# Patient Record
Sex: Male | Born: 1935 | Race: White | Hispanic: No | Smoking: Former smoker
Health system: Southern US, Community
[De-identification: ages and names within clinical notes are randomized; demographics above are authoritative.]

## PROBLEM LIST (undated history)

## (undated) DIAGNOSIS — S43109A Unspecified dislocation of unspecified acromioclavicular joint, initial encounter: Secondary | ICD-10-CM

## (undated) DIAGNOSIS — I480 Paroxysmal atrial fibrillation: Secondary | ICD-10-CM

## (undated) DIAGNOSIS — S2239XA Fracture of one rib, unspecified side, initial encounter for closed fracture: Secondary | ICD-10-CM

## (undated) DIAGNOSIS — M1990 Unspecified osteoarthritis, unspecified site: Secondary | ICD-10-CM

## (undated) DIAGNOSIS — I10 Essential (primary) hypertension: Secondary | ICD-10-CM

## (undated) DIAGNOSIS — R42 Dizziness and giddiness: Secondary | ICD-10-CM

## (undated) DIAGNOSIS — N400 Enlarged prostate without lower urinary tract symptoms: Secondary | ICD-10-CM

## (undated) DIAGNOSIS — Z8679 Personal history of other diseases of the circulatory system: Secondary | ICD-10-CM

## (undated) DIAGNOSIS — E11621 Type 2 diabetes mellitus with foot ulcer: Secondary | ICD-10-CM

## (undated) DIAGNOSIS — E7800 Pure hypercholesterolemia, unspecified: Secondary | ICD-10-CM

## (undated) DIAGNOSIS — I2510 Atherosclerotic heart disease of native coronary artery without angina pectoris: Secondary | ICD-10-CM

## (undated) DIAGNOSIS — Z8719 Personal history of other diseases of the digestive system: Secondary | ICD-10-CM

## (undated) DIAGNOSIS — K219 Gastro-esophageal reflux disease without esophagitis: Secondary | ICD-10-CM

## (undated) DIAGNOSIS — I509 Heart failure, unspecified: Secondary | ICD-10-CM

## (undated) DIAGNOSIS — E119 Type 2 diabetes mellitus without complications: Secondary | ICD-10-CM

## (undated) DIAGNOSIS — I471 Supraventricular tachycardia: Secondary | ICD-10-CM

## (undated) DIAGNOSIS — D509 Iron deficiency anemia, unspecified: Secondary | ICD-10-CM

## (undated) DIAGNOSIS — E039 Hypothyroidism, unspecified: Secondary | ICD-10-CM

## (undated) DIAGNOSIS — J309 Allergic rhinitis, unspecified: Secondary | ICD-10-CM

## (undated) DIAGNOSIS — I4719 Other supraventricular tachycardia: Secondary | ICD-10-CM

## (undated) DIAGNOSIS — Z87442 Personal history of urinary calculi: Secondary | ICD-10-CM

## (undated) DIAGNOSIS — I453 Trifascicular block: Secondary | ICD-10-CM

## (undated) HISTORY — DX: Atherosclerotic heart disease of native coronary artery without angina pectoris: I25.10

## (undated) HISTORY — DX: Iron deficiency anemia, unspecified: D50.9

## (undated) HISTORY — DX: Personal history of other diseases of the circulatory system: Z86.79

## (undated) HISTORY — DX: Benign prostatic hyperplasia without lower urinary tract symptoms: N40.0

## (undated) HISTORY — DX: Essential (primary) hypertension: I10

## (undated) HISTORY — DX: Type 2 diabetes mellitus without complications: E11.9

## (undated) HISTORY — PX: INSERT / REPLACE / REMOVE PACEMAKER: SUR710

## (undated) HISTORY — DX: Personal history of urinary calculi: Z87.442

## (undated) HISTORY — DX: Dizziness and giddiness: R42

## (undated) HISTORY — DX: Fracture of one rib, unspecified side, initial encounter for closed fracture: S22.39XA

## (undated) HISTORY — PX: FEMORAL ARTERY ANEURYSM REPAIR: SUR1157

## (undated) HISTORY — DX: Pure hypercholesterolemia, unspecified: E78.00

## (undated) HISTORY — PX: AV FISTULA REPAIR: SHX563

## (undated) HISTORY — DX: Allergic rhinitis, unspecified: J30.9

## (undated) HISTORY — DX: Personal history of other diseases of the digestive system: Z87.19

## (undated) HISTORY — DX: Unspecified dislocation of unspecified acromioclavicular joint, initial encounter: S43.109A

## (undated) HISTORY — DX: Gastro-esophageal reflux disease without esophagitis: K21.9

---
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
## 1978-09-06 HISTORY — PX: PTCA: SHX146

## 1987-07-27 ENCOUNTER — Encounter: Payer: Self-pay | Admitting: Cardiovascular Disease

## 1994-01-05 HISTORY — PX: CORONARY ARTERY BYPASS GRAFT: SHX141

## 1997-10-07 ENCOUNTER — Emergency Department (HOSPITAL_COMMUNITY): Admission: EM | Admit: 1997-10-07 | Discharge: 1997-10-07 | Payer: Self-pay

## 1997-10-26 ENCOUNTER — Encounter: Payer: Self-pay | Admitting: Neurosurgery

## 1997-10-29 ENCOUNTER — Ambulatory Visit (HOSPITAL_COMMUNITY): Admission: RE | Admit: 1997-10-29 | Discharge: 1997-10-30 | Payer: Self-pay | Admitting: Neurosurgery

## 1997-10-29 ENCOUNTER — Encounter: Payer: Self-pay | Admitting: Neurosurgery

## 1998-09-06 HISTORY — PX: BACK SURGERY: SHX140

## 1999-07-17 ENCOUNTER — Encounter: Payer: Self-pay | Admitting: Cardiovascular Disease

## 2002-07-18 ENCOUNTER — Encounter: Payer: Self-pay | Admitting: Cardiovascular Disease

## 2002-08-18 ENCOUNTER — Encounter: Admission: RE | Admit: 2002-08-18 | Discharge: 2002-08-18 | Payer: Self-pay | Admitting: Internal Medicine

## 2002-08-18 ENCOUNTER — Encounter: Payer: Self-pay | Admitting: Internal Medicine

## 2006-02-02 ENCOUNTER — Ambulatory Visit: Payer: Self-pay | Admitting: Gastroenterology

## 2006-03-01 ENCOUNTER — Ambulatory Visit: Payer: Self-pay | Admitting: Gastroenterology

## 2006-03-01 LAB — CONVERTED CEMR LAB
Basophils Absolute: 0 10*3/uL (ref 0.0–0.1)
Basophils Relative: 0.4 % (ref 0.0–1.0)
Eosinophils Absolute: 0.1 10*3/uL (ref 0.0–0.6)
Eosinophils Relative: 2.6 % (ref 0.0–5.0)
Ferritin: 4.7 ng/mL — ABNORMAL LOW (ref 22.0–322.0)
HCT: 28.6 % — ABNORMAL LOW (ref 39.0–52.0)
Hemoglobin: 9.3 g/dL — ABNORMAL LOW (ref 13.0–17.0)
Iron: 16 ug/dL — ABNORMAL LOW (ref 42–165)
Lymphocytes Relative: 24.9 % (ref 12.0–46.0)
MCHC: 32.6 g/dL (ref 30.0–36.0)
MCV: 80.1 fL (ref 78.0–100.0)
Monocytes Absolute: 0.3 10*3/uL (ref 0.2–0.7)
Monocytes Relative: 8.2 % (ref 3.0–11.0)
Neutro Abs: 2.5 10*3/uL (ref 1.4–7.7)
Neutrophils Relative %: 63.9 % (ref 43.0–77.0)
Platelets: 311 10*3/uL (ref 150–400)
RBC: 3.57 M/uL — ABNORMAL LOW (ref 4.22–5.81)
RDW: 14.9 % — ABNORMAL HIGH (ref 11.5–14.6)
WBC: 3.9 10*3/uL — ABNORMAL LOW (ref 4.5–10.5)

## 2008-09-06 ENCOUNTER — Encounter: Payer: Self-pay | Admitting: Cardiovascular Disease

## 2009-01-09 ENCOUNTER — Encounter: Payer: Self-pay | Admitting: Cardiovascular Disease

## 2009-04-09 ENCOUNTER — Encounter: Payer: Self-pay | Admitting: Cardiovascular Disease

## 2009-04-11 ENCOUNTER — Telehealth: Payer: Self-pay | Admitting: Gastroenterology

## 2009-04-24 ENCOUNTER — Ambulatory Visit (HOSPITAL_COMMUNITY): Admission: RE | Admit: 2009-04-24 | Discharge: 2009-04-24 | Payer: Self-pay | Admitting: Gastroenterology

## 2009-05-07 ENCOUNTER — Encounter: Payer: Self-pay | Admitting: Cardiovascular Disease

## 2009-06-06 ENCOUNTER — Encounter: Payer: Self-pay | Admitting: Cardiovascular Disease

## 2009-06-18 ENCOUNTER — Encounter: Payer: Self-pay | Admitting: Cardiovascular Disease

## 2009-06-19 ENCOUNTER — Ambulatory Visit (HOSPITAL_COMMUNITY): Admission: RE | Admit: 2009-06-19 | Discharge: 2009-06-19 | Payer: Self-pay | Admitting: Gastroenterology

## 2009-06-25 ENCOUNTER — Encounter: Payer: Self-pay | Admitting: Cardiovascular Disease

## 2009-10-21 ENCOUNTER — Encounter: Payer: Self-pay | Admitting: Cardiovascular Disease

## 2009-11-11 ENCOUNTER — Encounter: Payer: Self-pay | Admitting: Cardiovascular Disease

## 2009-11-15 ENCOUNTER — Encounter: Payer: Self-pay | Admitting: Cardiovascular Disease

## 2009-11-20 ENCOUNTER — Encounter: Payer: Self-pay | Admitting: Cardiovascular Disease

## 2009-12-02 DIAGNOSIS — Z87442 Personal history of urinary calculi: Secondary | ICD-10-CM | POA: Insufficient documentation

## 2009-12-02 DIAGNOSIS — J309 Allergic rhinitis, unspecified: Secondary | ICD-10-CM | POA: Insufficient documentation

## 2009-12-02 DIAGNOSIS — I10 Essential (primary) hypertension: Secondary | ICD-10-CM | POA: Insufficient documentation

## 2009-12-02 DIAGNOSIS — E7800 Pure hypercholesterolemia, unspecified: Secondary | ICD-10-CM | POA: Insufficient documentation

## 2009-12-02 DIAGNOSIS — K219 Gastro-esophageal reflux disease without esophagitis: Secondary | ICD-10-CM | POA: Insufficient documentation

## 2009-12-02 DIAGNOSIS — Z8679 Personal history of other diseases of the circulatory system: Secondary | ICD-10-CM | POA: Insufficient documentation

## 2009-12-02 DIAGNOSIS — I2510 Atherosclerotic heart disease of native coronary artery without angina pectoris: Secondary | ICD-10-CM | POA: Insufficient documentation

## 2009-12-02 DIAGNOSIS — D509 Iron deficiency anemia, unspecified: Secondary | ICD-10-CM | POA: Insufficient documentation

## 2009-12-02 DIAGNOSIS — E1121 Type 2 diabetes mellitus with diabetic nephropathy: Secondary | ICD-10-CM | POA: Insufficient documentation

## 2009-12-03 ENCOUNTER — Ambulatory Visit (HOSPITAL_COMMUNITY): Admission: RE | Admit: 2009-12-03 | Discharge: 2009-12-03 | Payer: Self-pay | Admitting: Cardiovascular Disease

## 2009-12-03 ENCOUNTER — Ambulatory Visit: Payer: Self-pay

## 2009-12-03 ENCOUNTER — Ambulatory Visit: Payer: Self-pay | Admitting: Cardiology

## 2009-12-03 ENCOUNTER — Ambulatory Visit: Payer: Self-pay | Admitting: Cardiovascular Disease

## 2009-12-03 ENCOUNTER — Encounter: Payer: Self-pay | Admitting: Cardiovascular Disease

## 2009-12-03 DIAGNOSIS — R011 Cardiac murmur, unspecified: Secondary | ICD-10-CM | POA: Insufficient documentation

## 2010-02-04 NOTE — Assessment & Plan Note (Signed)
Summary: np3/dx:cad/lg   CC:  referal from Dr. Renne Crigler.  History of Present Illness: Evan [name] is a previous patient of Dr Daleen Squibb.  Apparantly his wife and TW had a falling out and he has not been seen at [facility] for a while.  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  No recent evaluation.  Had some angina earlier this year with profound anemia.  Had gi w/u with EGD and colon with no source found.  Hct over 40 now with a transfusion and Fe Rx.  About a month ago he had a TIA involveing speech difficulty and right arm paresthesias.  CT negative.  Apparantly had carodids done some where and he indicates they were ok.  Was scheduled to have echo at [facility].  Denies palpitaitions syncope and has not had a history of afib.  Has not had any SSCP since amemia gone and he is not interested in an ischemic evaluation.  He has F/U with [facility] for his TIA.     Current Problems (verified): 1)  Cardiac Murmur  (ICD-785.2) 2)  Hypertension  (ICD-401.9) 3)  Cad  (ICD-414.00) 4)  Dm  (ICD-250.00) 5)  Transient Ischemic Attacks, Hx of  (ICD-V12.50) 6)  Allergic Rhinitis  (ICD-477.9) 7)  Nephrolithiasis, Hx of  (ICD-V13.01) 8)  Gerd  (ICD-530.81) 9)  Anemia  (ICD-285.9) 10)  Hypercholesterolemia  (ICD-272.0)  Current Medications (verified): 1)  Metoprolol Tartrate 50 Mg Tabs (Metoprolol Tartrate) .... Take One Tablet By Mouth Twice A Day 2)  Verapamil Hcl Cr 180 Mg Xr24h-Cap (Verapamil Hcl) .... Take One Capsule By Mouth Daily 3)  Icaps  Caps (Multiple Vitamins-Minerals) .Marland Kitchen.. 1 Tab By Mouth Once Daily 4)  Metformin Hcl 500 Mg Tabs (Metformin Hcl) .... 2 Tabs By Mouth Two Times A Day 5)  Lipitor 40 Mg Tabs (Atorvastatin Calcium) .... Take One Tablet By Mouth Daily. 6)  Aspirin Ec 325 Mg Tbec (Aspirin) .... Take One Tablet By Mouth Daily 7)  Hydrochlorothiazide 25 Mg Tabs (Hydrochlorothiazide) .... Take One Tablet By Mouth Daily. 8)  Trandolapril 2 Mg Tabs (Trandolapril) .Marland Kitchen.. 1 Tab By Mouth Once  Daily 9)  Ferrous Sulfate 325 (65 Fe) Mg  Tabs (Ferrous Sulfate) .... 2 or 3 Tabs By Mouth Once Daily  Allergies (verified): 1)  ! Demerol  Past History:  Past Medical History: Last updated: 12/02/2009 HYPERTENSION  CAD DM TRANSIENT ISCHEMIC ATTACKS, HX OF ALLERGIC RHINITIS NEPHROLITHIASIS, HX OF  GERD  ANEMIA  HYPERCHOLESTEROLEMIA   Past Surgical History: Last updated: 12/02/2009 Angioplasty 1980's Cardiac Bypass 1996 Back surgery 2000's Colonoscopy with snare polypectomy.  Fistula in ano repair Right femoral pseudo-aneurysm repair    Family History: Last updated: 06/19/2009  Remarkable for diabetes in mother, but no known  gastrointestinal problems.  His father did have coronary artery disease.  Social History: Last updated: 06/19/2009 The patient is married and lives with his wife.  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  He does not smoke or use ethanol.  Review of Systems       Denies fever, malais, weight loss, blurry vision, decreased visual acuity, cough, sputum, SOB, hemoptysis, pleuritic pain, palpitaitons, heartburn, abdominal pain, melena, lower extremity edema, claudication, or rash.   Vital Signs:  Patient profile:   77 year old male Height:      67 inches Weight:      167 pounds BMI:  26.25 Pulse rate:   75 / minute Resp:     14 per minute BP sitting:   139 / 82  (left arm)  Vitals Entered By: Kem Parkinson (December 03, 2009 11:09 AM)  Physical Exam  General:  Affect appropriate Healthy:  appears stated age HEENT: normal Neck supple with no adenopathy JVP normal  high pitched right  bruits no thyromegaly Lungs clear with no wheezing and good diaphragmatic motion Heart:  S1/S2 midl AS  murmur,rub, gallop or click PMI normal Abdomen: benighn, BS positve, no tenderness, no AAA no bruit.  No HSM or HJR Distal pulses intact with no bruits No edema Neuro non-focal Skin  warm and dry    Impression & Recommendations:  Problem # 1:  CARDIAC MURMUR (ICD-785.2) Echo to be done in our office today.  ? MIld AS and R/O source of embolus assess EF His updated medication list for this problem includes:    Metoprolol Tartrate 50 Mg Tabs (Metoprolol tartrate) .Marland Kitchen... Take one tablet by mouth twice a day    Hydrochlorothiazide 25 Mg Tabs (Hydrochlorothiazide) .Marland Kitchen... Take one tablet by mouth daily.    Trandolapril 2 Mg Tabs (Trandolapril) .Marland Kitchen... 1 tab by mouth once daily  Orders: Echocardiogram (Echo)  Problem # 2:  CAD (ICD-414.00) STabel with good Hct.  No functional study unless angina returns His updated medication list for this problem includes:    Metoprolol Tartrate 50 Mg Tabs (Metoprolol tartrate) .Marland Kitchen... Take one tablet by mouth twice a day    Verapamil Hcl Cr 180 Mg Xr24h-cap (Verapamil hcl) .Marland Kitchen... Take one capsule by mouth daily    Aspirin Ec 325 Mg Tbec (Aspirin) .Marland Kitchen... Take one tablet by mouth daily    Trandolapril 2 Mg Tabs (Trandolapril) .Marland Kitchen... 1 tab by mouth once daily  Problem # 3:  TRANSIENT ISCHEMIC ATTACKS, HX OF (ICD-V12.50) Has right bruit  Will get carotid record.  Add Aggrenox per neuro F/U if appropriate  Problem # 4:  HYPERTENSION (ICD-401.9) Well controlled continue current meds His updated medication list for this problem includes:    Metoprolol Tartrate 50 Mg Tabs (Metoprolol tartrate) .Marland Kitchen... Take one tablet by mouth twice a day    Verapamil Hcl Cr 180 Mg Xr24h-cap (Verapamil hcl) .Marland Kitchen... Take one capsule by mouth daily    Aspirin Ec 325 Mg Tbec (Aspirin) .Marland Kitchen... Take one tablet by mouth daily    Hydrochlorothiazide 25 Mg Tabs (Hydrochlorothiazide) .Marland Kitchen... Take one tablet by mouth daily.    Trandolapril 2 Mg Tabs (Trandolapril) .Marland Kitchen... 1 tab by mouth once daily  Problem # 5:  HYPERCHOLESTEROLEMIA (ICD-272.0) Labs per Dr Renne Crigler continue statin His updated medication list for this problem includes:    Lipitor 40 Mg Tabs (Atorvastatin calcium) .Marland Kitchen...  Take one tablet by mouth daily.  Problem # 6:  ANEMIA (ICD-285.9) Keep Hct greater than 30 and continue iron  Patient Instructions: 1)  Your physician recommends that you schedule a follow-up appointment in: 8-10 WEEKS 2)  Your physician has requested that you have an echocardiogram.  Echocardiography is a painless test that uses sound waves to create images of your heart. It provides your doctor with information about the size and shape of your heart and how well your heart's chambers and valves are working.  This procedure takes approximately one hour. There are no restrictions for this procedure.

## 2010-02-04 NOTE — Letter (Signed)
Summary: [facility]  [facility]   Imported By: Marylou Mccoy 12/02/2009 15:25:16  _____________________________________________________________________  External Attachment:    Type:   Image     Comment:   External Document

## 2010-02-04 NOTE — Letter (Signed)
Summary: [facility]   Imported By: Marylou Mccoy 12/02/2009 15:22:34  _____________________________________________________________________  External Attachment:    Type:   Image     Comment:   External Document

## 2010-02-04 NOTE — Progress Notes (Signed)
Summary: Low hemoglobin  Phone Note From Other Clinic   Caller: Pat @ Dr Carolee Rota office [phone] Call For: Dr Jarold Motto Reason for Call: Schedule Patient Appt Summary of Call: Would like pt seen next week for low hemoglobin & fatigue. Unable to do thursday am. Last rov 2008. I ordered chart. Initial call taken by: Leanor Kail [facility],  April 11, 2009 9:59 AM  Follow-up for Phone Call        talked with Dennie Bible.  States she has made arrangements for pt to be seen. Follow-up by: Ashok Cordia RN,  April 11, 2009 3:16 PM

## 2010-02-04 NOTE — Progress Notes (Signed)
Summary: [facility]   Imported By: Debby Freiberg 06/19/2009 10:07:23  _____________________________________________________________________  External Attachment:    Type:   Image     Comment:   External Document

## 2010-02-04 NOTE — Progress Notes (Signed)
Summary: [facility]   Imported By: Debby Freiberg 06/19/2009 10:03:31  _____________________________________________________________________  External Attachment:    Type:   Image     Comment:   External Document

## 2010-02-04 NOTE — Letter (Signed)
Summary: [facility]   Imported By: Marylou Mccoy 12/02/2009 15:22:01  _____________________________________________________________________  External Attachment:    Type:   Image     Comment:   External Document

## 2010-02-11 ENCOUNTER — Ambulatory Visit: Payer: Self-pay | Admitting: Cardiovascular Disease

## 2010-03-05 ENCOUNTER — Other Ambulatory Visit: Payer: Self-pay | Admitting: Dermatology

## 2010-03-23 LAB — CROSSMATCH

## 2010-03-25 NOTE — Consult Note (Signed)
Summary: [facility]  [facility]   Imported By: Marylou Mccoy 03/20/2010 10:34:34  _____________________________________________________________________  External Attachment:    Type:   Image     Comment:   External Document

## 2010-03-25 NOTE — Letter (Signed)
Summary: Evan [name]   [name]   Imported By: Marylou Mccoy 03/20/2010 10:09:43  _____________________________________________________________________  External Attachment:    Type:   Image     Comment:   External Document  Appended Document: Evan [name]  PCI of LAD and RCA

## 2010-03-25 NOTE — Consult Note (Signed)
Summary: [facility]  [facility]   Imported By: Marylou Mccoy 03/20/2010 10:46:24  _____________________________________________________________________  External Attachment:    Type:   Image     Comment:   External Document

## 2010-05-23 NOTE — Assessment & Plan Note (Signed)
[name]                         GASTROENTEROLOGY OFFICE NOTE   Evan [name] [name]                    MRN:          [id number]  DATE:02/02/2006                            DOB:          1923-07-31    Evan [name] [name] is an 77 year old white male referred for evaluation of 5  days of rectal bleeding.   Evan [name] [name] has been in fairly good health all of his life except for  coronary artery disease requiring bypass surgery in 1996.  He had been  stable from a cardiac standpoint, taking a daily aspirin tablet.  Four  days ago, he developed bright maroonish blood per rectum without  abdominal pain or any systemic complaints.  He has had some mild  lightheadedness, but nothing serious, and no syncope, chest pain or  palpitations.  He had never had prior GI problems, although he did have  a flexible sigmoidoscopy by Dr. Renne Crigler some 5 years ago which was  apparently normal.  The patient does have adult onset diabetes mellitus.  He was seen on February 01, 2005 in dr. Carolee Rota office where a CBC showed  hematocrit of 35 and his aspirin was discontinued and he was referred  for examination.   The patient does have almost daily acid reflux and is on no medications  for such.  He denies dysphagia.  He has had no history of hepatobiliary  problems, pancreatitis or hepatitis.  He follows a regular diet.  His  appetite is good and his weight is stable.  He denies specific food  intolerances.   PAST MEDICAL HISTORY:  1. Besides his cardiac symptoms is unremarkable, except for back      surgery in 1999.  2. The patient does have, additionally, adult diabetes mellitus.  3. Hypertension.   MEDICATIONS:  1. Tarka 1/180 mg daily.  2. Lipitor 20 mg a day.  3. Metoprolol 50 mg twice a day.  4. Glucophage 1000 mg twice a day.  5. HCTZ 12.5 mg a day.  6. Aspirin 325 mg a day.   ALLERGIES:  Apparently in the past, DEMEROL has caused some hypotension  problems.   FAMILY HISTORY:  Remarkable for diabetes in mother, but no known  gastrointestinal problems.  His father did have coronary artery disease.   SOCIAL HISTORY:  The patient is married and lives with his wife.  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  He does not smoke or use ethanol.   REVIEW OF SYSTEMS:  Entirely noncontributory.  Denies current  cardiovascular, pulmonary or neurological problems.  He denies abuse of  NSAIDs, besides his aspirin therapy.   PHYSICAL EXAMINATION:  GENERAL:  He is awake and alert.  He is a  slightly pale appearing white male in no acute distress appearing his  stated age.  VITAL SIGNS:  He is 5 feet, 7 inches tall and weighs 163 pounds.  Blood  pressure is 128/62 and pulse was 80 and regular.  I could no appreciate the stigmata of chronic liver disease or  thyromegaly.  CHEST:  Clear.  He was in a regular rhythm without murmurs, gallops or  rubs.  ABDOMEN:  I could not appreciate hepatosplenomegaly, abdominal masses or  tenderness.  Bowel sounds were normal.  UPPER EXTREMITIES:  Unremarkable.  MENTAL STATUS:  Clear.  RECTAL:  Inspection of the rectum was unremarkable.  Rectal exam with  dark stool that was guaiac positive.   ASSESSMENT:  Evan [name] [name] most likely has had a resolving diverticular  hemorrhage.  He appears to be asymptomatic at this time and is having no  diarrhea or overt rectal bleeding.  He appears very stable  hemodynamically.   RECOMMENDATIONS:  1. Discontinue aspirin therapy.  2. Regular diet as tolerated.  3. Patient education concerning diverticulosis management.  4. AcipHex 20 mg daily for GERD.  5. Outpatient colonoscopy in 2-3 weeks' time.     Vania Rea. Jarold Motto, MD, Caleen Essex, FAGA  Electronically Signed    DRP/MedQ  DD: 02/02/2006  DT: 02/02/2006  Job #: [id number]   cc:   Soyla Murphy. Renne Crigler, M.D.

## 2011-01-09 ENCOUNTER — Encounter: Payer: Self-pay | Admitting: *Deleted

## 2011-01-13 ENCOUNTER — Ambulatory Visit (INDEPENDENT_AMBULATORY_CARE_PROVIDER_SITE_OTHER): Payer: Medicare Other | Admitting: Cardiovascular Disease

## 2011-01-13 ENCOUNTER — Encounter: Payer: Self-pay | Admitting: Cardiovascular Disease

## 2011-01-13 DIAGNOSIS — I2510 Atherosclerotic heart disease of native coronary artery without angina pectoris: Secondary | ICD-10-CM

## 2011-01-13 DIAGNOSIS — I10 Essential (primary) hypertension: Secondary | ICD-10-CM

## 2011-01-13 DIAGNOSIS — Z8679 Personal history of other diseases of the circulatory system: Secondary | ICD-10-CM

## 2011-01-13 DIAGNOSIS — E7800 Pure hypercholesterolemia, unspecified: Secondary | ICD-10-CM

## 2011-01-13 NOTE — Assessment & Plan Note (Signed)
Reports well controlled labs with Dr Renne Crigler q 3 months

## 2011-01-13 NOTE — Patient Instructions (Signed)
Your physician wants you to follow-up in: YEAR WITH DR NISHAN  You will receive a reminder letter in the mail two months in advance. If you don't receive a letter, please call our office to schedule the follow-up appointment.  Your physician recommends that you continue on your current medications as directed. Please refer to the Current Medication list given to you today. 

## 2011-01-13 NOTE — Assessment & Plan Note (Signed)
Stable with no angina and good activity level.  Continue medical Rx  

## 2011-01-13 NOTE — Assessment & Plan Note (Signed)
No recurrence continue ASA

## 2011-01-13 NOTE — Assessment & Plan Note (Signed)
Well controlled.  Continue current medications and low sodium Dash type diet.    

## 2011-01-13 NOTE — Progress Notes (Signed)
Patient ID: Evan [name], male   DOB: December 29, 1923, 78 y.o.   MRN: [id number] Evan [name] is a previous patient of Dr Daleen Squibb. Apparantly his wife and TW had a falling out and he has not been seen at [facility] for a while. He has a history of distant CABG in 58 with PVT. No recent evaluation. Had some angina earlier this year with profound anemia. Had gi w/u with EGD and colon with no source found. Hct over 40 now with a transfusion and Fe Rx. About a month ago he had a TIA involveing speech difficulty and right arm paresthesias. CT negative. Apparantly had carodids done some where and he indicates they were ok. Was scheduled to have echo at [facility]. Denies palpitaitions syncope and has not had a history of afib. Has not had any SSCP since amemia gone and he is not interested in an ischemic evaluation. He has F/U with [facility] for his TIA.   Prostate enlarged on avodart now  No more hematuria Wife with dementia and getting hard to handle  Echo reviewed 12/03/09 normal EF and only mild MR with MAC   ROS: Denies fever, malais, weight loss, blurry vision, decreased visual acuity, cough, sputum, SOB, hemoptysis, pleuritic pain, palpitaitons, heartburn, abdominal pain, melena, lower extremity edema, claudication, or rash.  All other systems reviewed and negative  General: Affect appropriate Healthy:  appears stated age HEENT: normal Neck supple with no adenopathy JVP normal no bruits no thyromegaly Lungs clear with no wheezing and good diaphragmatic motion Heart:  S1/S2 no murmur,rub, gallop or click PMI normal Abdomen: benighn, BS positve, no tenderness, no AAA no bruit.  No HSM or HJR Distal pulses intact with no bruits No edema Neuro non-focal Skin warm and dry Recent skin cancer removal on forehead No muscular weakness   Current Outpatient Prescriptions  Medication Sig Dispense Refill  . aspirin 325 MG EC tablet Take 325 mg by mouth daily.        Marland Kitchen dutasteride (AVODART) 0.5 MG capsule  Take 0.5 mg by mouth daily.        . ferrous sulfate 325 (65 FE) MG tablet Take 325 mg by mouth daily with breakfast.        . hydrochlorothiazide (HYDRODIURIL) 25 MG tablet Take 25 mg by mouth daily. 1/2 TAB EVER DAY      . lansoprazole (PREVACID) 15 MG capsule Take 15 mg by mouth daily.        . metFORMIN (GLUCOPHAGE) 500 MG tablet Take 1,000 mg by mouth 2 (two) times daily with a meal.        . metoprolol (LOPRESSOR) 50 MG tablet Take 50 mg by mouth 2 (two) times daily.        . Multiple Vitamins-Minerals (ICAPS PO) Take 1 capsule by mouth daily.        . rosuvastatin (CRESTOR) 10 MG tablet Take 10 mg by mouth daily.        . saxagliptin HCl (ONGLYZA) 5 MG TABS tablet Take by mouth daily.        . sertraline (ZOLOFT) 50 MG tablet Take 50 mg by mouth daily.        . trandolapril (MAVIK) 2 MG tablet Take 2 mg by mouth daily.        . verapamil (COVERA HS) 180 MG (CO) 24 hr tablet Take 180 mg by mouth at bedtime.        . vitamin C (ASCORBIC ACID) 500 MG tablet Take 500 mg by mouth daily.  Allergies  Demerol and Meperidine hcl  Electrocardiogram:  Assessment and Plan

## 2012-01-13 ENCOUNTER — Encounter: Payer: Self-pay | Admitting: Cardiovascular Disease

## 2012-01-13 ENCOUNTER — Ambulatory Visit (INDEPENDENT_AMBULATORY_CARE_PROVIDER_SITE_OTHER): Payer: Medicare Other | Admitting: Cardiovascular Disease

## 2012-01-13 VITALS — BP 199/70 | HR 70 | Ht 67.0 in | Wt 163.0 lb

## 2012-01-13 DIAGNOSIS — E7800 Pure hypercholesterolemia, unspecified: Secondary | ICD-10-CM

## 2012-01-13 DIAGNOSIS — R079 Chest pain, unspecified: Secondary | ICD-10-CM

## 2012-01-13 DIAGNOSIS — I2510 Atherosclerotic heart disease of native coronary artery without angina pectoris: Secondary | ICD-10-CM

## 2012-01-13 DIAGNOSIS — Z8679 Personal history of other diseases of the circulatory system: Secondary | ICD-10-CM

## 2012-01-13 DIAGNOSIS — I10 Essential (primary) hypertension: Secondary | ICD-10-CM

## 2012-01-13 DIAGNOSIS — R011 Cardiac murmur, unspecified: Secondary | ICD-10-CM

## 2012-01-13 MED ORDER — ISOSORBIDE MONONITRATE ER 30 MG PO TB24: 30.0000 mg | ORAL_TABLET | Freq: Every day | ORAL | Status: DC

## 2012-01-13 NOTE — Assessment & Plan Note (Signed)
Nonrecurrent continue ASA

## 2012-01-13 NOTE — Assessment & Plan Note (Signed)
Well controlled.  Continue current medications and low sodium Dash type diet.    

## 2012-01-13 NOTE — Assessment & Plan Note (Signed)
Cholesterol is at goal.  Continue current dose of statin and diet Rx.  No myalgias or side effects.  F/U  LFT's in 6 months. No results found for this basename: LDLCALC             

## 2012-01-13 NOTE — Patient Instructions (Signed)
Start Imdur 30mg  1 tablet daily.  Your physician recommends that you schedule a follow-up appointment in: 3 months with Dr Eden Emms.

## 2012-01-13 NOTE — Assessment & Plan Note (Signed)
AV sclerosis with no stenosis

## 2012-01-13 NOTE — Progress Notes (Signed)
Patient ID: Evan [name], male   DOB: 12-10-1923, 79 y.o.   MRN: [id number] Bearl is a previous patient of Dr Daleen Squibb. Apparantly his wife and TW had a falling out and he has not been seen at [facility] for a while. He has a history of distant CABG in 64 with PVT. No recent evaluation. Had some angina earlier this year with profound anemia. Had gi w/u with EGD and colon with no source found. Hct over 40 now with a transfusion and Fe Rx. About a month ago he had a TIA involveing speech difficulty and right arm paresthesias. CT negative. Apparantly had carodids done some where and he indicates they were ok. Was scheduled to have echo at [facility]. Denies palpitaitions syncope and has not had a history of afib. Has not had any SSCP since amemia gone and he is not interested in an ischemic evaluation. He has F/U with [facility] for his TIA.  Prostate enlarged on avodart now No more hematuria  Wife with dementia and getting hard to handle   Echo reviewed 12/03/09 normal EF and only mild MR with MAC  Having some angina and taken two nitro past 2 weeks.  Discussed options of medical rx vs myovue vs cath.  Patient prefers medical Rx given advanced age unless symtoms worsen  ROS: Denies fever, malais, weight loss, blurry vision, decreased visual acuity, cough, sputum, SOB, hemoptysis, pleuritic pain, palpitaitons, heartburn, abdominal pain, melena, lower extremity edema, claudication, or rash.  All other systems reviewed and negative  General: Affect appropriate Chronically ill desheveled male HEENT: normal Neck supple with no adenopathy JVP normal no bruits no thyromegaly Lungs clear with no wheezing and good diaphragmatic motion Heart:  S1/S2 SEM murmur, no rub, gallop or click PMI normal Abdomen: benighn, BS positve, no tenderness, no AAA no bruit.  No HSM or HJR Distal pulses intact with no bruits No edema Neuro non-focal Skin warm and dry No muscular weakness   Current Outpatient  Prescriptions  Medication Sig Dispense Refill  . aspirin 325 MG EC tablet Take 325 mg by mouth daily.        Marland Kitchen dutasteride (AVODART) 0.5 MG capsule Take 0.5 mg by mouth daily.        . ferrous sulfate 325 (65 FE) MG tablet Take 325 mg by mouth daily with breakfast.        . lansoprazole (PREVACID) 15 MG capsule Take 15 mg by mouth daily.        . metFORMIN (GLUCOPHAGE) 500 MG tablet Take 1,000 mg by mouth 2 (two) times daily with a meal.        . metoprolol (LOPRESSOR) 50 MG tablet Take 50 mg by mouth 2 (two) times daily.        . Multiple Vitamins-Minerals (ICAPS PO) Take 1 capsule by mouth daily.        . rosuvastatin (CRESTOR) 10 MG tablet Take 10 mg by mouth daily.        . saxagliptin HCl (ONGLYZA) 5 MG TABS tablet Take by mouth daily.        . sertraline (ZOLOFT) 50 MG tablet Take 50 mg by mouth daily.        . trandolapril (MAVIK) 2 MG tablet Take 2 mg by mouth daily.        . verapamil (COVERA HS) 180 MG (CO) 24 hr tablet Take 180 mg by mouth at bedtime.        . vitamin C (ASCORBIC ACID) 500 MG tablet Take  500 mg by mouth daily.          Allergies  Demerol and Meperidine hcl  Electrocardiogram:  SR rate 69  RBBB LPFB   Assessment and Plan

## 2012-01-13 NOTE — Assessment & Plan Note (Signed)
Add imdur Patient prefers to not have cath or myovue given advanced age and tolerability of symptoms

## 2012-03-23 ENCOUNTER — Ambulatory Visit (INDEPENDENT_AMBULATORY_CARE_PROVIDER_SITE_OTHER): Payer: Medicare Other | Admitting: Physician Assistant

## 2012-03-23 ENCOUNTER — Encounter: Payer: Self-pay | Admitting: Physician Assistant

## 2012-03-23 VITALS — BP 132/60 | HR 59 | Ht 67.0 in | Wt 159.0 lb

## 2012-03-23 DIAGNOSIS — R011 Cardiac murmur, unspecified: Secondary | ICD-10-CM

## 2012-03-23 DIAGNOSIS — R0989 Other specified symptoms and signs involving the circulatory and respiratory systems: Secondary | ICD-10-CM

## 2012-03-23 DIAGNOSIS — I209 Angina pectoris, unspecified: Secondary | ICD-10-CM | POA: Insufficient documentation

## 2012-03-23 DIAGNOSIS — R079 Chest pain, unspecified: Secondary | ICD-10-CM

## 2012-03-23 DIAGNOSIS — I25810 Atherosclerosis of coronary artery bypass graft(s) without angina pectoris: Secondary | ICD-10-CM | POA: Insufficient documentation

## 2012-03-23 DIAGNOSIS — I2510 Atherosclerotic heart disease of native coronary artery without angina pectoris: Secondary | ICD-10-CM

## 2012-03-23 MED ORDER — NITROGLYCERIN 0.4 MG SL SUBL: 0.4000 mg | SUBLINGUAL_TABLET | SUBLINGUAL | Status: DC | PRN

## 2012-03-23 NOTE — Assessment & Plan Note (Signed)
Patient has history of coronary artery disease status post CABG in 1996. He did have an episode of chest pain that awakened him from sleep 2-3 months ago that lasted about 10 minutes. Patient doesn't remember if he used nitroglycerin or not. He loses it all the time. He does not want any aggressive workup, and wants to be treated medically. He has had no further chest pain since this episode. We will give him a fresh prescription for nitroglycerin sublingual. He has a cough he has any further symptoms. He will follow up with Dr. Eden Emms in 1 month.

## 2012-03-23 NOTE — Assessment & Plan Note (Signed)
Patient does have a carotid bruit and history of TIA. I will order carotid Dopplers.

## 2012-03-23 NOTE — Patient Instructions (Addendum)
Your physician has requested that you have an echocardiogram the same day as your Carotid ultrasound and appointment with Dr. Eden Emms. Echocardiography is a painless test that uses sound waves to create images of your heart. It provides your doctor with information about the size and shape of your heart and how well your heart's chambers and valves are working. This procedure takes approximately one hour. There are no restrictions for this procedure.  Your physician has requested that you have a carotid duplex. This test is an ultrasound of the carotid arteries in your neck. It looks at blood flow through these arteries that supply the brain with blood. Allow one hour for this exam. There are no restrictions or special instructions.  Your physician recommends that you schedule a follow-up appointment with Dr. Eden Emms the same day as your Echo & carotid doppler  Your physician recommends that you continue on your current medications as directed. Please refer to the Current Medication list given to you today.                Nitroglycerin has been prescribed for you. Nitroglycerin injection Nitroglycerin sublingual tablets What is this medicine? NITROGLYCERIN (nye troe GLI ser in) is a type of vasodilator. It relaxes blood vessels, increasing the blood and oxygen supply to your heart. This medicine is used to relieve chest pain caused by angina. It is also used to prevent chest pain before activities like climbing stairs, going outdoors in cold weather, or sexual activity. This medicine may be used for other purposes; ask your health care provider or pharmacist if you have questions. What should I tell my health care provider before I take this medicine?  How should I use this medicine? Take this medicine by mouth as needed. At the first sign of an angina attack (chest pain or tightness) place one tablet under your tongue. You can also take this medicine 5 to 10 minutes before an event  likely to produce chest pain. Follow the directions on the prescription label. Let the tablet dissolve under the tongue. Do not swallow whole. Replace the dose if you accidentally swallow it. It will help if your mouth is not dry. Saliva around the tablet will help it to dissolve more quickly. Do not eat or drink, smoke or chew tobacco while a tablet is dissolving. If you are not better within 5 minutes after taking ONE dose of nitroglycerin, call [id number] immediately to seek emergency medical care. Do not take more than 3 nitroglycerin tablets over 15 minutes.

## 2012-03-23 NOTE — Progress Notes (Signed)
HPI:   This is an 79 year old white male patient of Dr.Nishan who has history of coronary artery disease status post CABG in 1996. He last saw Dr. Eden Emms in January 2014 at which time he admitted to having some angina relieved with 2 nitroglycerin but opted for medical therapy.   He is sent here today by Dr. Renne Crigler after he told him about an episode of chest pain. The patient says that occurred 2-3 months ago and awakened him from sleep. He described chest pain radiating into the left arm associated with nausea. It lasted about 10 minutes. He doesn't remember if he took nitroglycerin not. He is the primary caregiver for his wife who has Alzheimer's so he couldn't leave her. He is not very active but will mow the lawn when needed. He denies any exertional chest pain symptoms and this is the only episode he's had.   He has chronic weakness in his left leg for muscle atrophy so can't exercise regularly. This has also caused him to fall 6 times in the past 3 months. He says if he is walking on uneven ground he becomes unsteady and then his leg is too weak to compensate. He does have an occasional dizziness when changing position but this is actually improved over the years. Once again he states he does not want any aggressive workup for his coronary artery disease.  Allergies:  -- Demerol   -- Meperidine Hcl    --  REACTION: Hypotension  Current Outpatient Prescriptions on File Prior to Visit: aspirin 325 MG EC tablet, Take 325 mg by mouth daily.  , Disp: , Rfl:  ferrous sulfate 325 (65 FE) MG tablet, Take 325 mg by mouth daily with breakfast.  , Disp: , Rfl:  lansoprazole (PREVACID) 15 MG capsule, Take 15 mg by mouth daily.  , Disp: , Rfl:  metoprolol (LOPRESSOR) 50 MG tablet, Take 50 mg by mouth 2 (two) times daily.  , Disp: , Rfl:  Multiple Vitamins-Minerals (ICAPS PO), Take 1 capsule by mouth daily.  , Disp: , Rfl:  rosuvastatin (CRESTOR) 10 MG tablet, Take 10 mg by mouth daily.  , Disp: , Rfl:   saxagliptin HCl (ONGLYZA) 5 MG TABS tablet, Take by mouth daily.  , Disp: , Rfl:  sertraline (ZOLOFT) 50 MG tablet, Take 50 mg by mouth daily.  , Disp: , Rfl:  trandolapril (MAVIK) 2 MG tablet, Take 2 mg by mouth daily.  , Disp: , Rfl:   verapamil (COVERA HS) 180 MG (CO) 24 hr tablet, Take 180 mg by mouth at bedtime.  , Disp: , Rfl:  vitamin C (ASCORBIC ACID) 500 MG tablet, Take 500 mg by mouth daily.  , Disp: , Rfl:  isosorbide mononitrate (IMDUR) 30 MG 24 hr tablet, Take 1 tablet (30 mg total) by mouth daily., Disp: 30 tablet, Rfl: 11  No current facility-administered medications on file prior to visit.   Past Medical History:   HYPERTENSION                                                 HYPERCHOLESTEROLEMIA                                         CAD  TRANSIENT ISCHEMIC ATTACKS, HX OF                            NEPHROLITHIASIS, HX OF                                       GERD                                                         DM                                                           CARDIAC MURMUR                                               ANEMIA                                                       ALLERGIC RHINITIS                                           Past Surgical History:   PTCA                                             1980's       CORONARY ARTERY BYPASS GRAFT                     1996         BACK SURGERY                                     2000'S       AV FISTULA REPAIR                                             FEMORAL ARTERY ANEURYSM REPAIR                                  Comment:feroral pseudo-aneurysm repair  Review of patient's family history indicates:   Diabetes                       Mother  Coronary artery disease        Father                   Social History   Marital Status: Married             Spouse Name:                      Years of Education:                  Number of children:             Occupational History   None on file  Social History Main Topics   Smoking Status: Former Smoker                   Packs/Day: 0.00  Years:           Quit date: 01/06/1956   Smokeless Status: Not on file                      Alcohol Use: Not on file    Drug Use: Not on file    Sexual Activity: Not on file        Other Topics            Concern   None on file  Social History Narrative   Pt is married and lives with his wife.  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  He has a Naval architect.      IHK:VQQV of hearing, poor dentition, otherwise see history of present illness   PHYSICAL EXAM: Well-nournished, in no acute distress. Neck: left carotid bruit,No JVD, HJR,  or thyroid enlargement  Lungs: No tachypnea, clear without wheezing, rales, or rhonchi  Cardiovascular: RRR, PMI not displaced, 2/6 harsh systolic murmur at the right sternal border, no gallops, bruit, thrill, or heave.  Abdomen: BS normal. Soft without organomegaly, masses, lesions or tenderness.  Extremities: without cyanosis, clubbing or edema. Good distal pulses bilateral  SKin: Warm, no lesions or rashes   Musculoskeletal: No deformities  Neuro: no focal signs  BP 132/60  Pulse 59  Ht 5\' 7"  (1.702 m)  Wt 159 lb (72.122 kg)  BMI 24.9 kg/m2   ZDG:LOVFI bradycardia with first degree AV block at 59 beats per minute with right bundle branch block and left posterior fascicular block. No change from EKG in January 2014.

## 2012-03-23 NOTE — Assessment & Plan Note (Signed)
Patient has history of coronary artery disease status post CABG in 1996. He did have an episode of chest pain that awakened him from sleep 2-3 months ago that lasted about 10 minutes. Patient doesn't remember if he used nitroglycerin or not. He loses it all the time. He does not want any aggressive workup, and wants to be treated medically. He has had no further chest pain since this episode. We will give him a fresh prescription for nitroglycerin sublingual. He has a cough he has any further symptoms. He will follow up with Dr. Nishan in 1 month. 

## 2012-03-23 NOTE — Assessment & Plan Note (Signed)
Patient does have heart murmur consistent with possible aortic stenosis. His last 2-D echo was in 2011. He is agreed to 2-D echo.

## 2012-04-28 ENCOUNTER — Ambulatory Visit (HOSPITAL_COMMUNITY): Payer: Medicare Other | Attending: Cardiology | Admitting: Radiology

## 2012-04-28 ENCOUNTER — Encounter (INDEPENDENT_AMBULATORY_CARE_PROVIDER_SITE_OTHER): Payer: Medicare Other

## 2012-04-28 ENCOUNTER — Other Ambulatory Visit: Payer: Self-pay

## 2012-04-28 ENCOUNTER — Ambulatory Visit (INDEPENDENT_AMBULATORY_CARE_PROVIDER_SITE_OTHER): Payer: Medicare Other | Admitting: Cardiovascular Disease

## 2012-04-28 ENCOUNTER — Encounter: Payer: Self-pay | Admitting: Cardiovascular Disease

## 2012-04-28 VITALS — BP 190/72 | HR 65 | Wt 160.0 lb

## 2012-04-28 DIAGNOSIS — I6529 Occlusion and stenosis of unspecified carotid artery: Secondary | ICD-10-CM

## 2012-04-28 DIAGNOSIS — Z87891 Personal history of nicotine dependence: Secondary | ICD-10-CM | POA: Insufficient documentation

## 2012-04-28 DIAGNOSIS — E7800 Pure hypercholesterolemia, unspecified: Secondary | ICD-10-CM

## 2012-04-28 DIAGNOSIS — E785 Hyperlipidemia, unspecified: Secondary | ICD-10-CM | POA: Insufficient documentation

## 2012-04-28 DIAGNOSIS — I10 Essential (primary) hypertension: Secondary | ICD-10-CM

## 2012-04-28 DIAGNOSIS — R0989 Other specified symptoms and signs involving the circulatory and respiratory systems: Secondary | ICD-10-CM

## 2012-04-28 DIAGNOSIS — R011 Cardiac murmur, unspecified: Secondary | ICD-10-CM

## 2012-04-28 DIAGNOSIS — I2510 Atherosclerotic heart disease of native coronary artery without angina pectoris: Secondary | ICD-10-CM | POA: Insufficient documentation

## 2012-04-28 DIAGNOSIS — R079 Chest pain, unspecified: Secondary | ICD-10-CM | POA: Insufficient documentation

## 2012-04-28 DIAGNOSIS — Z8673 Personal history of transient ischemic attack (TIA), and cerebral infarction without residual deficits: Secondary | ICD-10-CM | POA: Insufficient documentation

## 2012-04-28 NOTE — Patient Instructions (Signed)
Your physician wants you to follow-up in:  6 MONTHS WITH DR NISHAN  You will receive a reminder letter in the mail two months in advance. If you don't receive a letter, please call our office to schedule the follow-up appointment. Your physician recommends that you continue on your current medications as directed. Please refer to the Current Medication list given to you today. 

## 2012-04-28 NOTE — Assessment & Plan Note (Signed)
Mild AS with normal EF on echo today

## 2012-04-28 NOTE — Progress Notes (Signed)
Echocardiogram performed.  

## 2012-04-28 NOTE — Assessment & Plan Note (Signed)
Cholesterol is at goal.  Continue current dose of statin and diet Rx.  No myalgias or side effects.  F/U  LFT's in 6 months. No results found for this basename: LDLCALC             

## 2012-04-28 NOTE — Assessment & Plan Note (Signed)
Well controlled.  Continue current medications and low sodium Dash type diet.    

## 2012-04-28 NOTE — Progress Notes (Signed)
Patient ID: Evan [name], male   DOB: 03/08/1923, 79 y.o.   MRN: [id number] This is an 79 year old white male patient  who has history of coronary artery disease status post CABG in 1996. He last saw me in January 2014 at which time he admitted to having some angina relieved with 2 nitroglycerin but opted for medical therapy.   He is the primary caregiver for his wife who has Alzheimer's so he doesn't like to go to hospital He is not very active but will mow the lawn when needed. He denies any exertional chest pain symptoms He has chronic weakness in his left leg for muscle atrophy so can't exercise regularly. This has also caused him to fall 6 times in the past 3 months. He says if he is walking on uneven ground he becomes unsteady and then his leg is too weak to compensate. He does have an occasional dizziness when changing position but this is actually improved over the years. Once again he states he does not want any aggressive workup for his coronary artery disease.  Had and echo and a carotid today reviewed both  Echo with mild AS and normal EF Carotid with 40-59% LICA stenosis  ROS: Denies fever, malais, weight loss, blurry vision, decreased visual acuity, cough, sputum, SOB, hemoptysis, pleuritic pain, palpitaitons, heartburn, abdominal pain, melena, lower extremity edema, claudication, or rash.  All other systems reviewed and negative  General: Affect appropriate Chronically ill male HEENT: normal Neck supple with no adenopathy JVP normal left bruits no thyromegaly Lungs clear with no wheezing and good diaphragmatic motion Heart:  S1/S2 AS murmur, no rub, gallop or click PMI normal Abdomen: benighn, BS positve, no tenderness, no AAA no bruit.  No HSM or HJR Distal pulses intact with no bruits No edema Neuro non-focal Skin warm and dry No muscular weakness   Current Outpatient Prescriptions  Medication Sig Dispense Refill  . aspirin 325 MG EC tablet Take 325 mg by mouth daily.         . Cyanocobalamin (VITAMIN B-12 PO) Take 1 tablet by mouth daily.      . ferrous sulfate 325 (65 FE) MG tablet Take 325 mg by mouth daily with breakfast.        . finasteride (PROSCAR) 5 MG tablet Take 5 mg by mouth daily.      . isosorbide mononitrate (IMDUR) 30 MG 24 hr tablet Take 1 tablet (30 mg total) by mouth daily.  30 tablet  11  . lansoprazole (PREVACID) 15 MG capsule Take 15 mg by mouth daily.        . metFORMIN (GLUCOPHAGE) 1000 MG tablet Take 1,000 mg by mouth 2 (two) times daily with a meal.      . metoprolol (LOPRESSOR) 50 MG tablet Take 50 mg by mouth 2 (two) times daily.        . nitroGLYCERIN (NITROSTAT) 0.4 MG SL tablet Place 1 tablet (0.4 mg total) under the tongue every 5 (five) minutes as needed for chest pain.  90 tablet  3  . rosuvastatin (CRESTOR) 10 MG tablet Take 10 mg by mouth daily.        . saxagliptin HCl (ONGLYZA) 5 MG TABS tablet Take by mouth daily.        . sertraline (ZOLOFT) 50 MG tablet Take 50 mg by mouth daily.        . trandolapril (MAVIK) 2 MG tablet Take 2 mg by mouth daily.        . verapamil (  COVERA HS) 180 MG (CO) 24 hr tablet Take 180 mg by mouth at bedtime.        . vitamin C (ASCORBIC ACID) 500 MG tablet Take 500 mg by mouth daily.         No current facility-administered medications for this visit.    Allergies  Demerol and Meperidine hcl  Electrocardiogram:  SR rate 59  RBBB/LAFB PR 230   Assessment and Plan

## 2012-04-28 NOTE — Assessment & Plan Note (Signed)
Stable with no angina and good activity level.  Continue medical Rx  

## 2012-04-28 NOTE — Assessment & Plan Note (Signed)
Stable 40-59% LICA stenosis f/u duplex in 6 months

## 2012-10-25 ENCOUNTER — Ambulatory Visit: Payer: Medicare Other | Admitting: Cardiovascular Disease

## 2012-11-04 ENCOUNTER — Encounter: Payer: Self-pay | Admitting: Cardiovascular Disease

## 2012-11-04 ENCOUNTER — Other Ambulatory Visit: Payer: Self-pay | Admitting: *Deleted

## 2012-11-04 ENCOUNTER — Ambulatory Visit (INDEPENDENT_AMBULATORY_CARE_PROVIDER_SITE_OTHER): Payer: Medicare Other | Admitting: Cardiovascular Disease

## 2012-11-04 VITALS — BP 155/66 | HR 71 | Wt 159.0 lb

## 2012-11-04 DIAGNOSIS — I350 Nonrheumatic aortic (valve) stenosis: Secondary | ICD-10-CM

## 2012-11-04 DIAGNOSIS — I2510 Atherosclerotic heart disease of native coronary artery without angina pectoris: Secondary | ICD-10-CM

## 2012-11-04 DIAGNOSIS — I10 Essential (primary) hypertension: Secondary | ICD-10-CM

## 2012-11-04 DIAGNOSIS — E119 Type 2 diabetes mellitus without complications: Secondary | ICD-10-CM

## 2012-11-04 DIAGNOSIS — E7800 Pure hypercholesterolemia, unspecified: Secondary | ICD-10-CM

## 2012-11-04 DIAGNOSIS — R079 Chest pain, unspecified: Secondary | ICD-10-CM

## 2012-11-04 DIAGNOSIS — I359 Nonrheumatic aortic valve disorder, unspecified: Secondary | ICD-10-CM

## 2012-11-04 MED ORDER — ISOSORBIDE MONONITRATE ER 30 MG PO TB24: 30.0000 mg | ORAL_TABLET | Freq: Every day | ORAL | Status: DC

## 2012-11-04 NOTE — Assessment & Plan Note (Signed)
Well controlled.  Continue current medications and low sodium Dash type diet.    

## 2012-11-04 NOTE — Patient Instructions (Signed)
Your physician wants you to follow-up in:  6 MONTHS WITH DR NISHAN  You will receive a reminder letter in the mail two months in advance. If you don't receive a letter, please call our office to schedule the follow-up appointment. Your physician recommends that you continue on your current medications as directed. Please refer to the Current Medication list given to you today. 

## 2012-11-04 NOTE — Assessment & Plan Note (Signed)
Angina improved with imdur and new living arrangements. Patient does not want cath Continue medical RX

## 2012-11-04 NOTE — Assessment & Plan Note (Signed)
Mild by recent echo Does not need routine f/u unless symptoms change

## 2012-11-04 NOTE — Progress Notes (Signed)
Patient ID: Evan [name], male   DOB: 06-22-1923, 79 y.o.   MRN: [id number] This is an 79 year old white male patient who has history of coronary artery disease status post CABG in 1996. He last saw mein January 2014 at which time he admitted to having some angina relieved with 2 nitroglycerin but opted for medical therapy.   Saw PA 3/14 sent  by Dr. Renne Crigler after he told him about an episode of chest pain. The patient says that occurred 2-3 months ago and awakened him from sleep. He described chest pain radiating into the left arm associated with nausea. It lasted about 10 minutes. He doesn't remember if he took nitroglycerin not. He is the primary caregiver for his wife who has Alzheimer's so he couldn't leave her. He is not very active but will mow the lawn when needed. He denies any exertional chest pain symptoms and this is the only episode he's had.  He has chronic weakness in his left leg for muscle atrophy so can't exercise regularly. This has also caused him to fall 6 times in the past 3 months. He says if he is walking on uneven ground he becomes unsteady and then his leg is too weak to compensate. He does have an occasional dizziness when changing position but this is actually improved over the years. Once again he states he does not want any aggressive workup for his coronary artery disease.  Echo 4/24 mild AS and normal EF reviewed  Study Conclusions  - Left ventricle: The cavity size was normal. Wall thickness was increased in a pattern of moderate LVH. There was mild focal basal hypertrophy of the septum. Systolic function was normal. The estimated ejection fraction was in the range of 55% to 60%. - Aortic valve: There was mild stenosis. Trivial regurgitation. Mean gradient: 12mm Hg (S). Peak gradient: 26mm Hg (S). - Mitral valve: Calcified annulus. - Left atrium: The atrium was mildly dilated. - Atrial septum: A patent foramen ovale cannot be excluded.  Since moving to  [place] and not having to care for wife daily chest pain is improved Needs refill on imdur.  Angina less frequent  ROS: Denies fever, malais, weight loss, blurry vision, decreased visual acuity, cough, sputum, SOB, hemoptysis, pleuritic pain, palpitaitons, heartburn, abdominal pain, melena, lower extremity edema, claudication, or rash.  All other systems reviewed and negative  General: Affect appropriate Frail elderly male HEENT: normal Neck supple with no adenopathy JVP normal no bruits no thyromegaly Lungs clear with no wheezing and good diaphragmatic motion Heart:  S1/S2 midl AS  murmur, no rub, gallop or click PMI normal Abdomen: benighn, BS positve, no tenderness, no AAA no bruit.  No HSM or HJR Distal pulses intact with no bruits No edema Neuro non-focal Skin warm and dry No muscular weakness   Current Outpatient Prescriptions  Medication Sig Dispense Refill  . aspirin 325 MG EC tablet Take 325 mg by mouth daily.        . Cyanocobalamin (VITAMIN B-12 PO) Take 1 tablet by mouth daily.      . ferrous sulfate 325 (65 FE) MG tablet Take 325 mg by mouth daily with breakfast.        . finasteride (PROSCAR) 5 MG tablet Take 5 mg by mouth daily.      . isosorbide mononitrate (IMDUR) 30 MG 24 hr tablet Take 1 tablet (30 mg total) by mouth daily.  30 tablet  11  . lansoprazole (PREVACID) 15 MG capsule Take 15 mg by mouth  daily.        . metFORMIN (GLUCOPHAGE) 1000 MG tablet Take 1,000 mg by mouth 2 (two) times daily with a meal.      . metoprolol (LOPRESSOR) 50 MG tablet Take 50 mg by mouth 2 (two) times daily.        . nitroGLYCERIN (NITROSTAT) 0.4 MG SL tablet Place 1 tablet (0.4 mg total) under the tongue every 5 (five) minutes as needed for chest pain.  90 tablet  3  . rosuvastatin (CRESTOR) 10 MG tablet Take 10 mg by mouth daily.        . saxagliptin HCl (ONGLYZA) 5 MG TABS tablet Take by mouth daily.        . sertraline (ZOLOFT) 50 MG tablet Take 50 mg by mouth daily.         . trandolapril (MAVIK) 2 MG tablet Take 2 mg by mouth daily.        . verapamil (COVERA HS) 180 MG (CO) 24 hr tablet Take 180 mg by mouth at bedtime.        . vitamin C (ASCORBIC ACID) 500 MG tablet Take 500 mg by mouth daily.         No current facility-administered medications for this visit.    Allergies  Demerol and Meperidine hcl  Electrocardiogram: 03/23/12  SR rate 69 RBBB LPFB no acute ischemic changes   Assessment and Plan

## 2012-11-04 NOTE — Assessment & Plan Note (Signed)
Discussed low carb diet.  Target hemoglobin A1c is 6.5 or less.  Continue current medications.  

## 2012-11-04 NOTE — Assessment & Plan Note (Signed)
Cholesterol is at goal.  Continue current dose of statin and diet Rx.  No myalgias or side effects.  F/U  LFT's in 6 months. No results found for this basename: LDLCALC             

## 2013-03-22 ENCOUNTER — Ambulatory Visit (INDEPENDENT_AMBULATORY_CARE_PROVIDER_SITE_OTHER): Payer: Medicare Other | Admitting: Cardiovascular Disease

## 2013-03-22 ENCOUNTER — Encounter: Payer: Self-pay | Admitting: Cardiovascular Disease

## 2013-03-22 VITALS — BP 180/84 | HR 63 | Ht 67.0 in | Wt 156.0 lb

## 2013-03-22 DIAGNOSIS — I359 Nonrheumatic aortic valve disorder, unspecified: Secondary | ICD-10-CM

## 2013-03-22 DIAGNOSIS — I2510 Atherosclerotic heart disease of native coronary artery without angina pectoris: Secondary | ICD-10-CM

## 2013-03-22 DIAGNOSIS — I459 Conduction disorder, unspecified: Secondary | ICD-10-CM | POA: Insufficient documentation

## 2013-03-22 DIAGNOSIS — I350 Nonrheumatic aortic (valve) stenosis: Secondary | ICD-10-CM

## 2013-03-22 DIAGNOSIS — I10 Essential (primary) hypertension: Secondary | ICD-10-CM

## 2013-03-22 NOTE — Assessment & Plan Note (Signed)
ECG with trifasicular block May need beta blocker decreased in future No syncope or evidence of high grade AV block  Do not increase beta blocker and do not add any other AV nodal blocking drugs including Aricept

## 2013-03-22 NOTE — Assessment & Plan Note (Signed)
Not severe on exam  Given age consider f/u echo in September  May not even be a TAVR candidate

## 2013-03-22 NOTE — Progress Notes (Signed)
Patient ID: Evan [name], male   DOB: Dec 21, 1923, 80 y.o.   MRN: [id number] This is an 80 year old white male patient who has history of coronary artery disease status post CABG in 1996. He last saw mein January 2014 at which time he admitted to having some angina relieved with 2 nitroglycerin but opted for medical therapy.  Saw PA 3/14 sent by Dr. Shelia Media after he told him about an episode of chest pain. The patient says that occurred 2-3 months ago and awakened him from sleep. He described chest pain radiating into the left arm associated with nausea. It lasted about 10 minutes. He doesn't remember if he took nitroglycerin not. He is the primary caregiver for his wife who has Alzheimer's so he couldn't leave her. He is not very active but will mow the lawn when needed. He denies any exertional chest pain symptoms and this is the only episode he's had.  He has chronic weakness in his left leg for muscle atrophy so can't exercise regularly. This has also caused him to fall 6 times in the past 3 months. He says if he is walking on uneven ground he becomes unsteady and then his leg is too weak to compensate. He does have an occasional dizziness when changing position but this is actually improved over the years. Once again he states he does not want any aggressive workup for his coronary artery disease.  Echo 4/24 mild AS and normal EF reviewed  Study Conclusions  - Left ventricle: The cavity size was normal. Wall thickness was increased in a pattern of moderate LVH. There was mild focal basal hypertrophy of the septum. Systolic function was normal. The estimated ejection fraction was in the range of 55% to 60%. - Aortic valve: There was mild stenosis. Trivial regurgitation. Mean gradient: 4mm Hg (S). Peak gradient: 28mm Hg (S). - Mitral valve: Calcified annulus. - Left atrium: The atrium was mildly dilated. - Atrial septum: A patent foramen ovale cannot be excluded.  Since moving to [place] and  not having to care for wife daily chest pain is improved Needs refill on imdur. Angina less frequent   BP labile and meds being adjusted by Dr Shelia Media    ROS: Denies fever, malais, weight loss, blurry vision, decreased visual acuity, cough, sputum, SOB, hemoptysis, pleuritic pain, palpitaitons, heartburn, abdominal pain, melena, lower extremity edema, claudication, or rash.  All other systems reviewed and negative  General: Affect appropriate Frail elderly male  HEENT: normal Neck supple with no adenopathy JVP normal no bruits no thyromegaly Lungs clear with no wheezing and good diaphragmatic motion Heart:  S1/S2 preserved and AS  murmur, no rub, gallop or click PMI normal Abdomen: benighn, BS positve, no tenderness, no AAA no bruit.  No HSM or HJR Distal pulses intact with no bruits No edema Neuro non-focal Skin warm and dry No muscular weakness   Current Outpatient Prescriptions  Medication Sig Dispense Refill  . aspirin 325 MG EC tablet Take 325 mg by mouth daily.        . Cyanocobalamin (VITAMIN B-12 PO) Take 1 tablet by mouth daily.      . finasteride (PROSCAR) 5 MG tablet Take 5 mg by mouth daily.      . isosorbide mononitrate (IMDUR) 30 MG 24 hr tablet Take 1 tablet (30 mg total) by mouth daily.  30 tablet  11  . lansoprazole (PREVACID) 15 MG capsule Take 15 mg by mouth daily.        . metFORMIN (GLUCOPHAGE)  1000 MG tablet Take 1,000 mg by mouth 2 (two) times daily with a meal.      . metoprolol (LOPRESSOR) 50 MG tablet Take 50 mg by mouth 2 (two) times daily.        . nitroGLYCERIN (NITROSTAT) 0.4 MG SL tablet Place 1 tablet (0.4 mg total) under the tongue every 5 (five) minutes as needed for chest pain.  90 tablet  3  . saxagliptin HCl (ONGLYZA) 5 MG TABS tablet Take by mouth daily.        . trandolapril (MAVIK) 2 MG tablet Take 2 mg by mouth daily.        . verapamil (COVERA HS) 180 MG (CO) 24 hr tablet Take 180 mg by mouth at bedtime.        . vitamin C (ASCORBIC ACID)  500 MG tablet Take 500 mg by mouth daily.         No current facility-administered medications for this visit.    Allergies  Demerol and Meperidine hcl  Electrocardiogram:  SR rate 71  PR 224  PAC RBBB LPFB    Assessment and Plan

## 2013-03-22 NOTE — Assessment & Plan Note (Signed)
Meds adjusted by staff at [facility] and Dr Shelia Media ? Starting diuretic

## 2013-03-22 NOTE — Patient Instructions (Signed)
Your physician wants you to follow-up in:    [place]  ECHO  [place] will receive a reminder letter in the mail two months in advance. If you don't receive a letter, please call our office to schedule the follow-up appointment. Your physician recommends that you continue on your current medications as directed. Please refer to the Current Medication list given to you today. Your physician has requested that you have an echocardiogram. Echocardiography is a painless test that uses sound waves to create images of your heart. It provides your doctor with information about the size and shape of your heart and how well your heart's chambers and valves are working. This procedure takes approximately one hour. There are no restrictions for this procedure.  6 MONTHS ECHO

## 2013-03-22 NOTE — Assessment & Plan Note (Signed)
Stable with no angina and good activity level.  Continue medical Rx  

## 2013-04-06 ENCOUNTER — Other Ambulatory Visit (HOSPITAL_COMMUNITY): Payer: Medicare Other

## 2013-04-12 ENCOUNTER — Other Ambulatory Visit (HOSPITAL_COMMUNITY): Payer: Self-pay | Admitting: *Deleted

## 2013-04-12 DIAGNOSIS — I6529 Occlusion and stenosis of unspecified carotid artery: Secondary | ICD-10-CM

## 2013-05-03 ENCOUNTER — Ambulatory Visit (HOSPITAL_COMMUNITY): Payer: Medicare Other | Attending: Cardiovascular Disease | Admitting: Cardiology

## 2013-05-03 DIAGNOSIS — I6529 Occlusion and stenosis of unspecified carotid artery: Secondary | ICD-10-CM | POA: Insufficient documentation

## 2013-05-03 NOTE — Progress Notes (Signed)
Carotid duplex complete 

## 2013-10-20 ENCOUNTER — Other Ambulatory Visit: Payer: Self-pay | Admitting: Physician Assistant

## 2013-12-07 ENCOUNTER — Encounter (HOSPITAL_COMMUNITY): Payer: Self-pay | Admitting: Emergency Medicine

## 2013-12-07 ENCOUNTER — Emergency Department (HOSPITAL_COMMUNITY): Payer: Medicare Other

## 2013-12-07 ENCOUNTER — Inpatient Hospital Stay (HOSPITAL_COMMUNITY)
Admission: EM | Admit: 2013-12-07 | Discharge: 2013-12-13 | DRG: 243 | Disposition: A | Discharge status: Nursing Home (Includes ICF) | Payer: Medicare Other | Attending: Cardiology | Admitting: Cardiology

## 2013-12-07 DIAGNOSIS — Z8673 Personal history of transient ischemic attack (TIA), and cerebral infarction without residual deficits: Secondary | ICD-10-CM

## 2013-12-07 DIAGNOSIS — I10 Essential (primary) hypertension: Secondary | ICD-10-CM | POA: Diagnosis present

## 2013-12-07 DIAGNOSIS — I442 Atrioventricular block, complete: Secondary | ICD-10-CM | POA: Diagnosis present

## 2013-12-07 DIAGNOSIS — I453 Trifascicular block: Secondary | ICD-10-CM | POA: Diagnosis present

## 2013-12-07 DIAGNOSIS — I350 Nonrheumatic aortic (valve) stenosis: Secondary | ICD-10-CM | POA: Diagnosis present

## 2013-12-07 DIAGNOSIS — I25110 Atherosclerotic heart disease of native coronary artery with unstable angina pectoris: Secondary | ICD-10-CM | POA: Diagnosis present

## 2013-12-07 DIAGNOSIS — I501 Left ventricular failure: Secondary | ICD-10-CM | POA: Diagnosis present

## 2013-12-07 DIAGNOSIS — R079 Chest pain, unspecified: Secondary | ICD-10-CM | POA: Diagnosis not present

## 2013-12-07 DIAGNOSIS — I480 Paroxysmal atrial fibrillation: Secondary | ICD-10-CM | POA: Diagnosis not present

## 2013-12-07 DIAGNOSIS — Z833 Family history of diabetes mellitus: Secondary | ICD-10-CM

## 2013-12-07 DIAGNOSIS — K219 Gastro-esophageal reflux disease without esophagitis: Secondary | ICD-10-CM | POA: Diagnosis present

## 2013-12-07 DIAGNOSIS — E7800 Pure hypercholesterolemia, unspecified: Secondary | ICD-10-CM | POA: Diagnosis present

## 2013-12-07 DIAGNOSIS — Z7902 Long term (current) use of antithrombotics/antiplatelets: Secondary | ICD-10-CM

## 2013-12-07 DIAGNOSIS — I495 Sick sinus syndrome: Secondary | ICD-10-CM | POA: Diagnosis not present

## 2013-12-07 DIAGNOSIS — Z7982 Long term (current) use of aspirin: Secondary | ICD-10-CM

## 2013-12-07 DIAGNOSIS — I209 Angina pectoris, unspecified: Secondary | ICD-10-CM | POA: Diagnosis present

## 2013-12-07 DIAGNOSIS — Z8249 Family history of ischemic heart disease and other diseases of the circulatory system: Secondary | ICD-10-CM

## 2013-12-07 DIAGNOSIS — R001 Bradycardia, unspecified: Secondary | ICD-10-CM | POA: Insufficient documentation

## 2013-12-07 DIAGNOSIS — M6258 Muscle wasting and atrophy, not elsewhere classified, other site: Secondary | ICD-10-CM | POA: Diagnosis present

## 2013-12-07 DIAGNOSIS — Z950 Presence of cardiac pacemaker: Secondary | ICD-10-CM | POA: Diagnosis not present

## 2013-12-07 DIAGNOSIS — Z87891 Personal history of nicotine dependence: Secondary | ICD-10-CM

## 2013-12-07 DIAGNOSIS — H9190 Unspecified hearing loss, unspecified ear: Secondary | ICD-10-CM

## 2013-12-07 DIAGNOSIS — I441 Atrioventricular block, second degree: Secondary | ICD-10-CM | POA: Diagnosis not present

## 2013-12-07 DIAGNOSIS — Z79899 Other long term (current) drug therapy: Secondary | ICD-10-CM

## 2013-12-07 DIAGNOSIS — Z9861 Coronary angioplasty status: Secondary | ICD-10-CM

## 2013-12-07 DIAGNOSIS — R072 Precordial pain: Secondary | ICD-10-CM

## 2013-12-07 DIAGNOSIS — I2510 Atherosclerotic heart disease of native coronary artery without angina pectoris: Secondary | ICD-10-CM

## 2013-12-07 DIAGNOSIS — R55 Syncope and collapse: Secondary | ICD-10-CM | POA: Diagnosis present

## 2013-12-07 DIAGNOSIS — E119 Type 2 diabetes mellitus without complications: Secondary | ICD-10-CM | POA: Diagnosis present

## 2013-12-07 DIAGNOSIS — I471 Supraventricular tachycardia: Secondary | ICD-10-CM | POA: Diagnosis not present

## 2013-12-07 DIAGNOSIS — I2582 Chronic total occlusion of coronary artery: Secondary | ICD-10-CM | POA: Diagnosis present

## 2013-12-07 DIAGNOSIS — I25710 Atherosclerosis of autologous vein coronary artery bypass graft(s) with unstable angina pectoris: Secondary | ICD-10-CM | POA: Diagnosis present

## 2013-12-07 HISTORY — DX: Trifascicular block: I45.3

## 2013-12-07 HISTORY — DX: Paroxysmal atrial fibrillation: I48.0

## 2013-12-07 HISTORY — DX: Other supraventricular tachycardia: I47.19

## 2013-12-07 HISTORY — DX: Supraventricular tachycardia: I47.1

## 2013-12-07 LAB — CBC
HCT: 39.7 % (ref 39.0–52.0)
HEMOGLOBIN: 13 g/dL (ref 13.0–17.0)
MCH: 27 pg (ref 26.0–34.0)
MCHC: 32.7 g/dL (ref 30.0–36.0)
MCV: 82.5 fL (ref 78.0–100.0)
Platelets: 165 10*3/uL (ref 150–400)
RBC: 4.81 MIL/uL (ref 4.22–5.81)
RDW: 15 % (ref 11.5–15.5)
WBC: 5.3 10*3/uL (ref 4.0–10.5)

## 2013-12-07 LAB — URINALYSIS, ROUTINE W REFLEX MICROSCOPIC
Bilirubin Urine: NEGATIVE
Glucose, UA: NEGATIVE mg/dL
Hgb urine dipstick: NEGATIVE
KETONES UR: NEGATIVE mg/dL
NITRITE: NEGATIVE
Protein, ur: 30 mg/dL — AB
Specific Gravity, Urine: 1.023 (ref 1.005–1.030)
Urobilinogen, UA: 0.2 mg/dL (ref 0.0–1.0)
pH: 5 (ref 5.0–8.0)

## 2013-12-07 LAB — PROTIME-INR
INR: 1.02 (ref 0.00–1.49)
Prothrombin Time: 13.5 seconds (ref 11.6–15.2)

## 2013-12-07 LAB — URINE MICROSCOPIC-ADD ON

## 2013-12-07 LAB — COMPREHENSIVE METABOLIC PANEL
ALT: 18 U/L (ref 0–53)
ANION GAP: 14 (ref 5–15)
AST: 21 U/L (ref 0–37)
Albumin: 3.8 g/dL (ref 3.5–5.2)
Alkaline Phosphatase: 77 U/L (ref 39–117)
BUN: 19 mg/dL (ref 6–23)
CALCIUM: 9.2 mg/dL (ref 8.4–10.5)
CO2: 22 mEq/L (ref 19–32)
Chloride: 102 mEq/L (ref 96–112)
Creatinine, Ser: 1 mg/dL (ref 0.50–1.35)
GFR calc non Af Amer: 65 mL/min — ABNORMAL LOW (ref >90)
GFR, EST AFRICAN AMERICAN: 75 mL/min — AB (ref >90)
GLUCOSE: 154 mg/dL — AB (ref 70–99)
Potassium: 4.5 mEq/L (ref 3.7–5.3)
SODIUM: 138 meq/L (ref 137–147)
TOTAL PROTEIN: 6.8 g/dL (ref 6.0–8.3)
Total Bilirubin: 0.3 mg/dL (ref 0.3–1.2)

## 2013-12-07 LAB — I-STAT TROPONIN, ED: Troponin i, poc: 0.02 ng/mL (ref 0.00–0.08)

## 2013-12-07 LAB — APTT: aPTT: 26 seconds (ref 24–37)

## 2013-12-07 MED ORDER — ENOXAPARIN SODIUM 40 MG/0.4ML ~~LOC~~ SOLN
40.0000 mg | SUBCUTANEOUS | Status: DC
  Administered 2013-12-08 – 2013-12-10 (×3): 40 mg via SUBCUTANEOUS
  Filled 2013-12-07 (×3): qty 0.4

## 2013-12-07 MED ORDER — FINASTERIDE 5 MG PO TABS
5.0000 mg | ORAL_TABLET | Freq: Every day | ORAL | Status: DC
  Administered 2013-12-08 – 2013-12-13 (×6): 5 mg via ORAL
  Filled 2013-12-07 (×6): qty 1

## 2013-12-07 MED ORDER — ASPIRIN EC 325 MG PO TBEC
325.0000 mg | DELAYED_RELEASE_TABLET | Freq: Every day | ORAL | Status: DC
  Administered 2013-12-08 – 2013-12-11 (×4): 325 mg via ORAL
  Filled 2013-12-07 (×4): qty 1

## 2013-12-07 MED ORDER — ASPIRIN 81 MG PO CHEW: 324.0000 mg | CHEWABLE_TABLET | Freq: Once | ORAL | Status: DC

## 2013-12-07 MED ORDER — NITROGLYCERIN 0.4 MG SL SUBL: 0.4000 mg | SUBLINGUAL_TABLET | SUBLINGUAL | Status: DC | PRN

## 2013-12-07 MED ORDER — FUROSEMIDE 40 MG PO TABS
40.0000 mg | ORAL_TABLET | Freq: Every day | ORAL | Status: DC
  Administered 2013-12-08 – 2013-12-10 (×3): 40 mg via ORAL
  Filled 2013-12-07 (×3): qty 1

## 2013-12-07 MED ORDER — SODIUM CHLORIDE 0.9 % IV SOLN
20.0000 mL | INTRAVENOUS | Status: DC
  Administered 2013-12-07: 20 mL via INTRAVENOUS

## 2013-12-07 MED ORDER — AMLODIPINE BESYLATE 5 MG PO TABS
5.0000 mg | ORAL_TABLET | Freq: Every day | ORAL | Status: DC
  Administered 2013-12-07 – 2013-12-08 (×2): 5 mg via ORAL
  Filled 2013-12-07: qty 1

## 2013-12-07 MED ORDER — ONDANSETRON HCL 4 MG PO TABS: 4.0000 mg | ORAL_TABLET | Freq: Four times a day (QID) | ORAL | Status: DC | PRN

## 2013-12-07 MED ORDER — TRANDOLAPRIL 2 MG PO TABS
2.0000 mg | ORAL_TABLET | Freq: Every day | ORAL | Status: DC
  Administered 2013-12-08 – 2013-12-13 (×6): 2 mg via ORAL
  Filled 2013-12-07 (×6): qty 1

## 2013-12-07 MED ORDER — ONDANSETRON HCL 4 MG/2ML IJ SOLN: 4.0000 mg | Freq: Four times a day (QID) | INTRAMUSCULAR | Status: DC | PRN

## 2013-12-07 MED ORDER — ACETAMINOPHEN 325 MG PO TABS: 650.0000 mg | ORAL_TABLET | Freq: Four times a day (QID) | ORAL | Status: DC | PRN

## 2013-12-07 MED ORDER — SODIUM CHLORIDE 0.9 % IJ SOLN
3.0000 mL | Freq: Two times a day (BID) | INTRAMUSCULAR | Status: DC
  Administered 2013-12-08 – 2013-12-12 (×9): 3 mL via INTRAVENOUS

## 2013-12-07 MED ORDER — PANTOPRAZOLE SODIUM 40 MG PO TBEC
40.0000 mg | DELAYED_RELEASE_TABLET | Freq: Every day | ORAL | Status: DC
  Administered 2013-12-08 – 2013-12-13 (×6): 40 mg via ORAL
  Filled 2013-12-07 (×4): qty 1

## 2013-12-07 MED ORDER — ISOSORBIDE MONONITRATE ER 60 MG PO TB24
60.0000 mg | ORAL_TABLET | Freq: Every day | ORAL | Status: DC
  Administered 2013-12-08 – 2013-12-13 (×6): 60 mg via ORAL
  Filled 2013-12-07 (×7): qty 1

## 2013-12-07 MED ORDER — NITROGLYCERIN 0.4 MG SL SUBL
0.4000 mg | SUBLINGUAL_TABLET | SUBLINGUAL | Status: DC | PRN
  Filled 2013-12-07: qty 1

## 2013-12-07 MED ORDER — ACETAMINOPHEN 650 MG RE SUPP: 650.0000 mg | Freq: Four times a day (QID) | RECTAL | Status: DC | PRN

## 2013-12-07 NOTE — ED Notes (Signed)
Pt remains monitored by blood pressure, pulse ox, and 12 lead.  

## 2013-12-07 NOTE — ED Notes (Signed)
Pt placed on monitor upon arrival to room. Pt monitored by blood pressure, pulse ox, and 12 lead. Pts  EKG given to and signed by Dr. Jeanell Sparrow

## 2013-12-07 NOTE — ED Notes (Signed)
EMS called for chest pain. Pt has been having intermittent chest pain. Facility ([facility]) called EMS for his chest pain. Pt is taking 2-3 nitro per day (self medicating) for the last few weeks. Pt has been refusing to take lasix for the past two weeks. Pt's HR 40's sinus bradycardia. BP 170/82.

## 2013-12-07 NOTE — H&P (Signed)
Patient ID: Evan [name] MRN: [id number], DOB/AGE: 05-19-1923   Admit date: 12/07/2013   Primary Physician: Horatio Pel, MD Primary Cardiologist: Dr Jenkins Rouge  Pt. Profile:  Chest pain  Problem List  Past Medical History  Diagnosis Date  . HYPERTENSION   . HYPERCHOLESTEROLEMIA   . CAD   . TRANSIENT ISCHEMIC ATTACKS, HX OF   . NEPHROLITHIASIS, HX OF   . GERD   . DM   . CARDIAC MURMUR   . ANEMIA   . ALLERGIC RHINITIS     Past Surgical History  Procedure Laterality Date  . Ptca  1980's  . Coronary artery bypass graft  1996  . Back surgery  2000'S  . Av fistula repair    . Femoral artery aneurysm repair      feroral pseudo-aneurysm repair     Allergies  Allergies  Allergen Reactions  . Demerol   . Meperidine Hcl     REACTION: Hypotension    HPI  80 year old white male patient who has history of CAD, s/p CABG in 1996. Admitted in January 2014 with angina, relieved with 2 nitroglycerin but opted for medical therapy.  He is followed in the clinic by Dr Johnsie Cancel, last seen in March 2015 when he . Described CP that occurred 2-3 months ago and awakened him from sleep. He described chest pain radiating into the left arm associated with nausea. It lasted about 10 minutes. He doesn't remember if he took nitroglycerin not. He is the primary caregiver for his wife who has Alzheimer's so he couldn't leave her. He is not very active but will mow the lawn when needed. He denies any exertional chest pain symptoms and this is the only episode he's had. He has chronic weakness in his left leg for muscle atrophy so can't exercise regularly. This has also caused him to fall 6 times in the past 3 months.  He is coming today complaining of dizziness, recurrent syncope and chest pain for the last 2 weeks. He states that he is here now because the facility that he is at decided he needed to be assessed today. He states that he did not wish to be assessed. He has been  experiencing dizziness and 3 syncopal episodes in the last 3 weeks. He states that his BP that is usually 140-150 was in 110 range, so he stopped lasix as that was the last med added to his list and he thought it's causing his symptoms. He was also experiencing minor chest pain about twice daily relieved by sl NTG.    Home Medications  Prior to Admission medications   Medication Sig Start Date End Date Taking? Authorizing Provider  aspirin 325 MG EC tablet Take 325 mg by mouth daily.     Yes Historical Provider, MD  finasteride (PROSCAR) 5 MG tablet Take 5 mg by mouth daily.   Yes Historical Provider, MD  furosemide (LASIX) 40 MG tablet Take 40 mg by mouth daily.   Yes Historical Provider, MD  isosorbide mononitrate (IMDUR) 30 MG 24 hr tablet Take 30 mg by mouth daily.   Yes Historical Provider, MD  lansoprazole (PREVACID) 15 MG capsule Take 15 mg by mouth daily.     Yes Historical Provider, MD  metoprolol (LOPRESSOR) 50 MG tablet Take 50 mg by mouth 2 (two) times daily.     Yes Historical Provider, MD  NITROSTAT 0.4 MG SL tablet PLACE 1 TABLET (0.4 MG TOTAL) UNDER THE TONGUE EVERY 5 (FIVE) MINUTES AS NEEDED  FOR CHEST PAIN. 10/20/13  Yes [name], MD  saxagliptin HCl (ONGLYZA) 5 MG TABS tablet Take by mouth daily.     Yes Historical Provider, MD  trandolapril (MAVIK) 2 MG tablet Take 2 mg by mouth daily.     Yes Historical Provider, MD  verapamil (COVERA HS) 180 MG (CO) 24 hr tablet Take 180 mg by mouth at bedtime.     Yes Historical Provider, MD  vitamin C (ASCORBIC ACID) 500 MG tablet Take 500 mg by mouth daily.     Yes Historical Provider, MD  isosorbide mononitrate (IMDUR) 30 MG 24 hr tablet Take 1 tablet (30 mg total) by mouth daily. 11/04/12 11/04/13  [name], MD    Family History  Family History  Problem Relation Age of Onset  . Diabetes Mother   . Coronary artery disease Father     Social History  History   Social History  . Marital Status: Married    Spouse  Name: N/A    Number of Children: N/A  . Years of Education: N/A   Occupational History  . Not on file.   Social History Main Topics  . Smoking status: Former Smoker    Quit date: 01/06/1956  . Smokeless tobacco: Not on file  . Alcohol Use: Not on file  . Drug Use: Not on file  . Sexual Activity: Not on file   Other Topics Concern  . Not on file   Social History Narrative   Pt is married and lives with his wife.  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       Review of Systems General:  No chills, fever, night sweats or weight changes.  Cardiovascular:  No chest pain, dyspnea on exertion, edema, orthopnea, palpitations, paroxysmal nocturnal dyspnea. Dermatological: No rash, lesions/masses Respiratory: No cough, dyspnea Urologic: No hematuria, dysuria Abdominal:   No nausea, vomiting, diarrhea, bright red blood per rectum, melena, or hematemesis Neurologic:  No visual changes, wkns, changes in mental status. All other systems reviewed and are otherwise negative except as noted above.  Physical Exam  Blood pressure 174/43, pulse 41, temperature 98.1 F (36.7 C), temperature source Oral, resp. rate 22, height 5\' 7"  (1.702 m), weight 168 lb (76.204 kg), SpO2 97 %.  General: Pleasant, NAD Psych: Normal affect. Neuro: Alert and oriented X 3. Moves all extremities spontaneously. HEENT: Normal  Neck: Supple without bruits or JVD. Lungs:  Resp regular and unlabored, CTA. Heart: RRR no s3, s4, 4/6 systolic murmur. Abdomen: Soft, non-tender, non-distended, BS + x 4.  Extremities: No clubbing, cyanosis or edema. DP/PT/Radials 2+ and equal bilaterally.  Labs  No results for input(s): CKTOTAL, CKMB, TROPONINI in the last 72 hours. Lab Results  Component Value Date   WBC 5.3 12/07/2013   HGB 13.0 12/07/2013   HCT 39.7 12/07/2013   MCV 82.5 12/07/2013   PLT 165 12/07/2013    Recent Labs Lab 12/07/13 1630  NA 138  K 4.5  CL 102    CO2 22  BUN 19  CREATININE 1.00  CALCIUM 9.2  PROT 6.8  BILITOT 0.3  ALKPHOS 77  ALT 18  AST 21  GLUCOSE 154*   Radiology/Studies  Dg Chest Portable 1 View  12/07/2013   CLINICAL DATA:  Chest pain and bradycardia.  IMPRESSION: 1. Attribute the indistinctness of the left hemidiaphragm to the low lung volumes and prominent epicardial adipose tissue. I doubt that there is a  significant left lower lobe airspace opacity. Lateral chest radiography could provide further assurance, an would be recommended if left basilar auscultation is abnormal. 2. Mild cardiomegaly.  Prior CABG.     Echocardiogram - 04/28/2013 - Left ventricle: The cavity size was normal. Wall thickness was increased in a pattern of moderate LVH. There was mild focal basal hypertrophy of the septum. Systolic function was normal. The estimated ejection fraction was in the range of 55% to 60%. - Aortic valve: There was mild stenosis. Trivial regurgitation. Mean gradient: 75mm Hg (S). Peak gradient: 34mm Hg (S). - Mitral valve: Calcified annulus. - Left atrium: The atrium was mildly dilated. - Atrial septum: A patent foramen ovale cannot be excluded.  ECG: ? Escape junctional rhythm and chronic RBBB and LPFB     ASSESSMENT AND PLAN  80 year old male   1. Symptomatic bradycardia with recurrent syncope - down to mid 30', intermittent sinus brady with escape junctional rhythm. We will hold metoprolol and verapamil and follow.  2. Hypertension - we will increase imdur to 60 mg po daily and add amlodipine 2.5 mg po daily for now. His BP is 190 today and we are discontinuing varapamil and metoprolol.  3. Chest pain - negative troponin, no ischemic changes on ECG, the patient doesn't wish to proceed with any work up, I agree and think that his CP is sec to hypertension  4. Aortic stenosis - with 4/6 murmur, AS mild on echo in April, I doubt it progressed to symptomatic, however we will repeat echo to  re-evaluate   Signed, Dorothy Spark, MD, [facility] 12/07/2013, 6:22 PM

## 2013-12-07 NOTE — ED Notes (Signed)
Pt denies any chest pain at this time. Pt states he has taken one nitro today.

## 2013-12-07 NOTE — ED Notes (Signed)
Attempted to give report 

## 2013-12-07 NOTE — ED Notes (Signed)
Cardiology at bedside.

## 2013-12-07 NOTE — ED Notes (Signed)
Pt heart rate in low 40's, pt asymptomatic at this time watching television. Complaining of no pain at all.

## 2013-12-07 NOTE — ED Provider Notes (Signed)
CSN: [id number]     Arrival date & time 12/07/13  1541 History   First MD Initiated Contact with Patient 12/07/13 1550     No chief complaint on file.    (Consider location/radiation/quality/duration/timing/severity/associated sxs/prior Treatment) HPI 80 year old male with history of coronary artery disease and trifascicular block presents today complaining of chest pain for 2 weeks. He states that he is here now because the facility that he is at decided he needed to be assessed today. He states that he did not wish to be assessed. He has been having chest pain twice a day. He describes it as substernal in nature and likely previous heart pain. He describes it as pressure in nature and 3-4 out of 10. It has resolved each time with nitroglycerin use. He has  not been taking his  Lasix for approximately 4 weeks. He states he stopped taking his Lasix because he was getting lightheaded and had fallen. He decided it was his Lasix because that was the last medicine added. He states he has had decreased lightheadedness and falls since he stopped taking the Lasix. He currently is pain-free. At times  the pains radiates into both his arms. He denies any other associated symptoms. His primary care doctor is Dr. Concha Pyo. Dr. Olive Bass and is his cardiologist. He denies any recent changes in medications besides stopping his Lasix. Past Medical History  Diagnosis Date  . HYPERTENSION   . HYPERCHOLESTEROLEMIA   . CAD   . TRANSIENT ISCHEMIC ATTACKS, HX OF   . NEPHROLITHIASIS, HX OF   . GERD   . DM   . CARDIAC MURMUR   . ANEMIA   . ALLERGIC RHINITIS    Past Surgical History  Procedure Laterality Date  . Ptca  1980's  . Coronary artery bypass graft  1996  . Back surgery  2000'S  . Av fistula repair    . Femoral artery aneurysm repair      feroral pseudo-aneurysm repair   Family History  Problem Relation Age of Onset  . Diabetes Mother   . Coronary artery disease Father    History  Substance Use Topics   . Smoking status: Former Smoker    Quit date: 01/06/1956  . Smokeless tobacco: Not on file  . Alcohol Use: Not on file    Review of Systems  Musculoskeletal: Negative.        Abrasion on left knee. States he fell earlier today and struck his knee. He denies any other injury from this. He has been up and ambulatory since the fall. He states that he tripped and struck his knee on something.  All other systems reviewed and are negative.     Allergies  Demerol and Meperidine hcl  Home Medications   Prior to Admission medications   Medication Sig Start Date End Date Taking? Authorizing Provider  aspirin 325 MG EC tablet Take 325 mg by mouth daily.      Historical Provider, MD  Cyanocobalamin (VITAMIN B-12 PO) Take 1 tablet by mouth daily.    Historical Provider, MD  finasteride (PROSCAR) 5 MG tablet Take 5 mg by mouth daily.    Historical Provider, MD  isosorbide mononitrate (IMDUR) 30 MG 24 hr tablet Take 1 tablet (30 mg total) by mouth daily. 11/04/12 11/04/13  Evan Hector, MD  lansoprazole (PREVACID) 15 MG capsule Take 15 mg by mouth daily.      Historical Provider, MD  metFORMIN (GLUCOPHAGE) 1000 MG tablet Take 1,000 mg by mouth 2 (two) times  daily with a meal.    Historical Provider, MD  metoprolol (LOPRESSOR) 50 MG tablet Take 50 mg by mouth 2 (two) times daily.      Historical Provider, MD  NITROSTAT 0.4 MG SL tablet PLACE 1 TABLET (0.4 MG TOTAL) UNDER THE TONGUE EVERY 5 (FIVE) MINUTES AS NEEDED FOR CHEST PAIN. 10/20/13   Evan Hector, MD  saxagliptin HCl (ONGLYZA) 5 MG TABS tablet Take by mouth daily.      Historical Provider, MD  trandolapril (MAVIK) 2 MG tablet Take 2 mg by mouth daily.      Historical Provider, MD  verapamil (COVERA HS) 180 MG (CO) 24 hr tablet Take 180 mg by mouth at bedtime.      Historical Provider, MD  vitamin C (ASCORBIC ACID) 500 MG tablet Take 500 mg by mouth daily.      Historical Provider, MD   BP 158/51 mmHg  Temp(Src) 98.1 F (36.7 C)  (Oral)  Resp 16  Ht 5\' 7"  (1.702 m)  Wt 168 lb (76.204 kg)  BMI 26.31 kg/m2  SpO2 98% Physical Exam  Constitutional: He is oriented to person, place, and time. He appears well-developed and well-nourished.  HENT:  Head: Normocephalic and atraumatic.  Nose: Nose normal.  Mouth/Throat: Oropharynx is clear and moist.  Eyes: Conjunctivae and EOM are normal. Pupils are equal, round, and reactive to light.  Neck: Normal range of motion. Neck supple.  Cardiovascular: Bradycardia present.   Pulmonary/Chest: Effort normal and breath sounds normal.  Abdominal: Soft. Bowel sounds are normal.  Musculoskeletal: Normal range of motion.  Abrasion left knee  Neurological: He is alert and oriented to person, place, and time. He has normal reflexes.  Skin: Skin is warm and dry.  Psychiatric: He has a normal mood and affect. His behavior is normal. Judgment and thought content normal.  Nursing note and vitals reviewed.   ED Course  Procedures (including critical care time) Labs Review Labs Reviewed  COMPREHENSIVE METABOLIC PANEL - Abnormal; Notable for the following:    Glucose, Bld 154 (*)    GFR calc non Af Amer 65 (*)    GFR calc Af Amer 75 (*)    All other components within normal limits  APTT  CBC  PROTIME-INR  URINALYSIS, ROUTINE W REFLEX MICROSCOPIC  I-STAT TROPOININ, ED    Imaging Review Dg Chest Portable 1 View  12/07/2013   CLINICAL DATA:  Chest pain and bradycardia.  EXAM: PORTABLE CHEST - 1 VIEW  COMPARISON:  12/12/2010  FINDINGS: Prior CABG. Prominent left epicardial adipose tissue. Low lung volumes are present, causing crowding of the pulmonary vasculature. Mildly enlarged cardiopericardial silhouette. Atherosclerotic aortic arch.  IMPRESSION: 1. Attribute the indistinctness of the left hemidiaphragm to the low lung volumes and prominent epicardial adipose tissue. I doubt that there is a significant left lower lobe airspace opacity. Lateral chest radiography could provide  further assurance, an would be recommended if left basilar auscultation is abnormal. 2. Mild cardiomegaly.  Prior CABG.   Electronically Signed   By: Sherryl Barters M.D.   On: 12/07/2013 16:32     EKG Interpretation   Date/Time:  Thursday December 07 2013 15:50:27 EST Ventricular Rate:  49 PR Interval:    QRS Duration: 127 QT Interval:  491 QTC Calculation: 443 R Axis:   119 Text Interpretation:  Atrial fibrillation RBBB and LPFB Nonspecific repol  abnormality, lateral leads Confirmed by Alyssha Housh MD, Jyllian Haynie ([id number]) on  12/07/2013 4:11:06 PM      MDM  Final diagnoses:  Symptomatic bradycardia   80 y.o. Male with known cad and history of trifascicular block presents today with two weeks of worsening angina requiring nitro several times a day.  EKG with ongoing block and now with unclear rhythm.  Labs stable and troponin normal.  Plan consult to cardiology.   Discussed with Dr. Meda Coffee and she will see for cardiology.   Shaune Pollack, MD 12/09/13 [phone]

## 2013-12-07 NOTE — ED Notes (Signed)
Pt took home nitroglycerin SL.  Stated he was having chest pain 4/10 "not terribly uncomfortable".  EDP made aware.  Informed pt he needed to let RN know if he was having pain in order to document.  Stated "that's not important, I needed my medicine now".  Reinforced to pt the need to use call bell.

## 2013-12-08 DIAGNOSIS — I359 Nonrheumatic aortic valve disorder, unspecified: Secondary | ICD-10-CM

## 2013-12-08 LAB — MRSA PCR SCREENING: MRSA by PCR: NEGATIVE

## 2013-12-08 LAB — GLUCOSE, CAPILLARY: Glucose-Capillary: 207 mg/dL — ABNORMAL HIGH (ref 70–99)

## 2013-12-08 MED ORDER — METOPROLOL TARTRATE 1 MG/ML IV SOLN
2.5000 mg | INTRAVENOUS | Status: DC | PRN
  Administered 2013-12-08: 2.5 mg via INTRAVENOUS
  Filled 2013-12-08 (×2): qty 5

## 2013-12-08 MED ORDER — ONDANSETRON HCL 4 MG/2ML IJ SOLN
INTRAMUSCULAR | Status: AC
  Filled 2013-12-08: qty 2

## 2013-12-08 MED ORDER — METOPROLOL TARTRATE 25 MG PO TABS
25.0000 mg | ORAL_TABLET | Freq: Two times a day (BID) | ORAL | Status: DC
  Administered 2013-12-08 – 2013-12-12 (×9): 25 mg via ORAL
  Filled 2013-12-08 (×10): qty 1

## 2013-12-08 MED ORDER — AMLODIPINE BESYLATE 10 MG PO TABS
10.0000 mg | ORAL_TABLET | Freq: Every day | ORAL | Status: DC
  Administered 2013-12-09 – 2013-12-13 (×5): 10 mg via ORAL
  Filled 2013-12-08 (×5): qty 1

## 2013-12-08 NOTE — Progress Notes (Signed)
Pt having frequents runs of PVCs. STAT EKG and VS obtained. BP 170/82 Manually and HR 70. PA notified and made aware of some changes. No new orders given at this time.

## 2013-12-08 NOTE — Clinical Social Work Psychosocial (Addendum)
80 year old male- resident of [facility].  CSW met with patient who is alert and oriented; states that he is very happy at facility and plans to return there. CSW spoke with Quentin Mulling- staff at [facility].  She stated that they will accept patient back when medically stable and if weekend d/c- please contact the med tech for review and to fax Fl2 to facility.  [phone]. FL2 will be initiated and placed on chart for MD's review. Patient was noted to be lying in bed- appropriate for situation. Denies any concerns or questions at this. Will ask weekend CSW to assist with d/c is indicated over the weekend.  Patient's daughter- Boden Stucky was notified -she lives and works in [place] but if available to come to be with her dad as needed.  She wants her father to return to facility when stable.   Lorie Phenix. [place], [place]   *Psychosocial Assessment format is different due to current computer system/program issues.

## 2013-12-08 NOTE — Progress Notes (Signed)
Pt had episode of due to ambulating to the bathroom CP, 7/10, HR at the time was in the 120-130's, did ECG showed sinus tach with SVT and R BBB, once pt was back in bed CP went to a 5/10, MD notified and came up to see the pt, HR still stayed in the 120's, MD ordered Metoprolol IV 2.5 mg 1 x dose it made the HR go back to 60-70's, no s/s after this, will continue to monitor, thanks, Romero Belling.

## 2013-12-08 NOTE — Plan of Care (Signed)
Called by RN to see if it was okay to give SL NTG as pt was c/o 4/10 CP but had a HR ~ 120-130 bpm.  ECG performed and tele reviewed.  It appears that he is having paroxysms of SVT.  It is difficult to say with certainty what type of SVT with the quality of the tracings and his intrinsic p wave amplitude, however, I suspect it is likely AFL vs AT and the RVR is causing his CP.  While here returns to SR with resolution of pain, however, he keeps having bouts of SVT.     Assessment: 80 yo man presented with symptomatic bradycardia and CP.  He is now noted to have SVT with RVR causing his CP with a clinical picture of tachy-brady syndrome.  Plan: - Metoprolol 2.5 mg IV q 5 x 2 doses prn for HR > 100 in attempt to suppress.  Will avoid being more aggressive since his admission was related to symptomatic bradycardia.   - I suspect the easiest fix here would be consideration for PPM with aggressive rate control after but will obviously defer to primary team as there are also other options.  Evan [name] 5:55 AM 12/08/2013

## 2013-12-08 NOTE — Progress Notes (Addendum)
    Subjective:  Mild chest heaviness this morning. No dyspnea.   Objective:  Filed Vitals:   12/08/13 0033 12/08/13 0446 12/08/13 0500 12/08/13 1021  BP: 168/60 180/70  188/84  Pulse: 77 115  70  Temp:   97.5 F (36.4 C)   TempSrc:   Oral   Resp:      Height:      Weight:      SpO2:        Intake/Output from previous day:  Intake/Output Summary (Last 24 hours) at 12/08/13 1123 Last data filed at 12/08/13 0950  Gross per 24 hour  Intake    220 ml  Output   1450 ml  Net  -1230 ml    Physical Exam: Physical exam: Well-developed well-nourished in no acute distress.  Skin is warm and dry.  HEENT is normal.  Neck is supple.  Chest is clear to auscultation with normal expansion.  Cardiovascular exam is bradycardic; 2/6 systolic murmur Abdominal exam nontender or distended. No masses palpated. Extremities show no edema. neuro grossly intact    Lab Results: Basic Metabolic Panel:  Recent Labs  12/07/13 1630  NA 138  K 4.5  CL 102  CO2 22  GLUCOSE 154*  BUN 19  CREATININE 1.00  CALCIUM 9.2   CBC:  Recent Labs  12/07/13 1630  WBC 5.3  HGB 13.0  HCT 39.7  MCV 82.5  PLT 40   80 year old male with past medical history of coronary artery disease status post coronary artery bypass and graft admitted with recent presyncopal episodes and bradycardia. Verapamil and metoprolol initially held. Patient then developed runs of SVT with rapid ventricular response. Also with complaints of chest pain possibly secondary to SVT. Patient wants only conservative measures for his chest pain and no aggressive cardiac evaluation.   Assessment/Plan:  1 bradycardia-patient was on metoprolol and verapamil at the time of admission. His heart rate has improved off of these medications but he appears to be having runs of SVT with a rapid ventricular response. Difficult situation. He would like to be conservative if possible but would agree to a pacemaker if necessary. For now we  will continue to hold his verapamil and resume metoprolol at 25 mg twice a day. My hope would be that lower dose metoprolol without verapamil would not cause bradycardia but would control HR; otherwise may need to consider pacemaker. Follow closely on telemetry. 2 hypertension-blood pressure is elevated. Metoprolol dose to be decreased in verapamil discontinued. Increase Norvasc to 10 mg daily and follow. 3 chest pain-possibly secondary to recurrent SVT. Cycle enzymes. Patient does not want cardiac catheterization or aggressive cardiac evaluation. He states I have been related to die for some time. 4 History of aortic stenosis-repeat echo pending. 5 coronary artery disease-continue aspirin.  Kirk Ruths 12/08/2013, 11:23 AM

## 2013-12-08 NOTE — Plan of Care (Signed)
Problem: Phase I Progression Outcomes Goal: MD aware of Cardiac Marker results Outcome: Completed/Met Date Met:  12/08/13 Goal: Voiding-avoid urinary catheter unless indicated Outcome: Completed/Met Date Met:  12/08/13

## 2013-12-08 NOTE — Progress Notes (Signed)
Contacted by nursing staff regarding frequent SVT, strips in patient's physical chart. 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 Came in with severe bradycardia, however has frequent SVT as well. Doing PRN IV metoprolol.   Hilbert Corrigan PA Pager: [phone]

## 2013-12-08 NOTE — Progress Notes (Signed)
  Echocardiogram 2D Echocardiogram has been performed.  Evan [name] 12/08/2013, 4:21 PM

## 2013-12-09 ENCOUNTER — Other Ambulatory Visit: Payer: Self-pay

## 2013-12-09 LAB — BASIC METABOLIC PANEL
ANION GAP: 21 — AB (ref 5–15)
BUN: 20 mg/dL (ref 6–23)
CHLORIDE: 99 meq/L (ref 96–112)
CO2: 19 meq/L (ref 19–32)
Calcium: 9.5 mg/dL (ref 8.4–10.5)
Creatinine, Ser: 1.24 mg/dL (ref 0.50–1.35)
GFR calc non Af Amer: 50 mL/min — ABNORMAL LOW (ref >90)
GFR, EST AFRICAN AMERICAN: 58 mL/min — AB (ref >90)
Glucose, Bld: 216 mg/dL — ABNORMAL HIGH (ref 70–99)
POTASSIUM: 3.8 meq/L (ref 3.7–5.3)
SODIUM: 139 meq/L (ref 137–147)

## 2013-12-09 LAB — MAGNESIUM: Magnesium: 1.7 mg/dL (ref 1.5–2.5)

## 2013-12-09 MED ORDER — PANTOPRAZOLE SODIUM 40 MG PO TBEC
DELAYED_RELEASE_TABLET | ORAL | Status: AC
  Filled 2013-12-09: qty 1

## 2013-12-09 NOTE — Progress Notes (Signed)
Pt has had one episode of bradycardia dropping to the high 30's this afternoon. Remains in SR 1st  degree HB.

## 2013-12-09 NOTE — Progress Notes (Signed)
UR completed 

## 2013-12-09 NOTE — Progress Notes (Signed)
    Subjective:  No chest pain; No dyspnea.   Objective:  Filed Vitals:   12/08/13 1250 12/08/13 1346 12/08/13 2048 12/09/13 0512  BP: 161/41 135/65 138/67 162/73  Pulse: 101 76 72 73  Temp:  97.4 F (36.3 C) 97.5 F (36.4 C) 97.7 F (36.5 C)  TempSrc:  Oral Tympanic Oral  Resp:  20 20 18   Height:      Weight:    168 lb 7.3 oz (76.41 kg)  SpO2:  98% 98% 95%    Intake/Output from previous day:  Intake/Output Summary (Last 24 hours) at 12/09/13 0920 Last data filed at 12/09/13 7939  Gross per 24 hour  Intake    600 ml  Output    876 ml  Net   -276 ml    Physical Exam: Physical exam: Well-developed well-nourished in no acute distress.  Skin is warm and dry.  HEENT is normal.  Neck is supple.  Chest is clear to auscultation with normal expansion.  Cardiovascular exam is bradycardic; 2/6 systolic murmur Abdominal exam nontender or distended. No masses palpated. Extremities show no edema. neuro grossly intact    Lab Results: Basic Metabolic Panel:  Recent Labs  12/07/13 1630  NA 138  K 4.5  CL 102  CO2 22  GLUCOSE 154*  BUN 19  CREATININE 1.00  CALCIUM 9.2   CBC:  Recent Labs  12/07/13 1630  WBC 5.3  HGB 13.0  HCT 39.7  MCV 82.5  PLT 46   80 year old male with past medical history of coronary artery disease status post coronary artery bypass and graft admitted with recent presyncopal episodes and bradycardia. Verapamil and metoprolol initially held. Patient then developed runs of SVT with rapid ventricular response. Also with complaints of chest pain possibly secondary to SVT. Patient wants only conservative measures for his chest pain and no aggressive cardiac evaluation but would consider pacemaker if needed.   Assessment/Plan:  1 bradycardia-patient was on metoprolol and verapamil at the time of admission. His heart rate improved off of these medications but he then had runs of SVT with a rapid ventricular response. He would like to be  conservative if possible but would agree to a pacemaker if necessary. Lower dose metoprolol added yesterday and patient not having significant bradycardia or prolonged runs of SVT. Continue to observe on telemetry today. My hope would be that lower dose metoprolol without verapamil would not cause bradycardia but would control HR and decrease SVT; otherwise may need to consider pacemaker and then increase dose of AV nodal blocking agents. 2 hypertension-Norvasc increased yesterday; follow BP and adjust meds as needed. 3 chest pain-possibly secondary to recurrent SVT. Enzymes negative. Patient does not want cardiac catheterization or aggressive cardiac evaluation. He states I have been related to die for some time. 4 History of aortic stenosis-repeat echo pending. 5 coronary artery disease-continue aspirin.  Kirk Ruths 12/09/2013, 9:20 AM

## 2013-12-10 DIAGNOSIS — Z8673 Personal history of transient ischemic attack (TIA), and cerebral infarction without residual deficits: Secondary | ICD-10-CM | POA: Diagnosis not present

## 2013-12-10 DIAGNOSIS — I480 Paroxysmal atrial fibrillation: Secondary | ICD-10-CM | POA: Diagnosis present

## 2013-12-10 DIAGNOSIS — Z833 Family history of diabetes mellitus: Secondary | ICD-10-CM | POA: Diagnosis not present

## 2013-12-10 DIAGNOSIS — I10 Essential (primary) hypertension: Secondary | ICD-10-CM | POA: Diagnosis present

## 2013-12-10 DIAGNOSIS — I441 Atrioventricular block, second degree: Secondary | ICD-10-CM | POA: Diagnosis not present

## 2013-12-10 DIAGNOSIS — Z79899 Other long term (current) drug therapy: Secondary | ICD-10-CM | POA: Diagnosis not present

## 2013-12-10 DIAGNOSIS — Z8249 Family history of ischemic heart disease and other diseases of the circulatory system: Secondary | ICD-10-CM | POA: Diagnosis not present

## 2013-12-10 DIAGNOSIS — I453 Trifascicular block: Secondary | ICD-10-CM | POA: Diagnosis present

## 2013-12-10 DIAGNOSIS — I495 Sick sinus syndrome: Secondary | ICD-10-CM | POA: Diagnosis present

## 2013-12-10 DIAGNOSIS — R079 Chest pain, unspecified: Secondary | ICD-10-CM | POA: Diagnosis present

## 2013-12-10 DIAGNOSIS — E780 Pure hypercholesterolemia: Secondary | ICD-10-CM | POA: Diagnosis present

## 2013-12-10 DIAGNOSIS — K219 Gastro-esophageal reflux disease without esophagitis: Secondary | ICD-10-CM | POA: Diagnosis present

## 2013-12-10 DIAGNOSIS — I350 Nonrheumatic aortic (valve) stenosis: Secondary | ICD-10-CM | POA: Diagnosis present

## 2013-12-10 DIAGNOSIS — I2582 Chronic total occlusion of coronary artery: Secondary | ICD-10-CM | POA: Diagnosis present

## 2013-12-10 DIAGNOSIS — I501 Left ventricular failure: Secondary | ICD-10-CM | POA: Diagnosis present

## 2013-12-10 DIAGNOSIS — Z7902 Long term (current) use of antithrombotics/antiplatelets: Secondary | ICD-10-CM | POA: Diagnosis not present

## 2013-12-10 DIAGNOSIS — I25710 Atherosclerosis of autologous vein coronary artery bypass graft(s) with unstable angina pectoris: Secondary | ICD-10-CM | POA: Diagnosis present

## 2013-12-10 DIAGNOSIS — Z7982 Long term (current) use of aspirin: Secondary | ICD-10-CM | POA: Diagnosis not present

## 2013-12-10 DIAGNOSIS — H9190 Unspecified hearing loss, unspecified ear: Secondary | ICD-10-CM | POA: Diagnosis present

## 2013-12-10 DIAGNOSIS — Z87891 Personal history of nicotine dependence: Secondary | ICD-10-CM | POA: Diagnosis not present

## 2013-12-10 DIAGNOSIS — M6258 Muscle wasting and atrophy, not elsewhere classified, other site: Secondary | ICD-10-CM | POA: Diagnosis present

## 2013-12-10 DIAGNOSIS — I471 Supraventricular tachycardia: Secondary | ICD-10-CM | POA: Diagnosis not present

## 2013-12-10 DIAGNOSIS — I25110 Atherosclerotic heart disease of native coronary artery with unstable angina pectoris: Secondary | ICD-10-CM | POA: Diagnosis present

## 2013-12-10 DIAGNOSIS — E119 Type 2 diabetes mellitus without complications: Secondary | ICD-10-CM | POA: Diagnosis present

## 2013-12-10 LAB — GLUCOSE, CAPILLARY: Glucose-Capillary: 156 mg/dL — ABNORMAL HIGH (ref 70–99)

## 2013-12-10 MED ORDER — ENOXAPARIN SODIUM 40 MG/0.4ML ~~LOC~~ SOLN: 40.0000 mg | SUBCUTANEOUS | Status: DC

## 2013-12-10 MED ORDER — FUROSEMIDE 40 MG PO TABS
40.0000 mg | ORAL_TABLET | Freq: Every day | ORAL | Status: DC
  Administered 2013-12-12 – 2013-12-13 (×2): 40 mg via ORAL
  Filled 2013-12-10 (×2): qty 1

## 2013-12-10 NOTE — Progress Notes (Signed)
    Subjective:  Chest pain last night- related to fast HR.  Objective:  Vital Signs in the last 24 hours: Temp:  [97.8 F (36.6 C)-98.5 F (36.9 C)] 98 F (36.7 C) (12/06 0501) Pulse Rate:  [67-100] 71 (12/06 0501) Resp:  [18] 18 (12/06 0501) BP: (111-161)/(56-96) 161/70 mmHg (12/06 0501) SpO2:  [97 %-98 %] 98 % (12/06 0501) Weight:  [160 lb 15 oz (73 kg)] 160 lb 15 oz (73 kg) (12/06 0501)  Intake/Output from previous day:  Intake/Output Summary (Last 24 hours) at 12/10/13 0859 Last data filed at 12/10/13 0518  Gross per 24 hour  Intake   1060 ml  Output    825 ml  Net    235 ml    Physical Exam: General appearance: alert, cooperative, no distress and HOH Lungs: clear to auscultation bilaterally Heart: regular rate and rhythm and 2/6 systolic murmur   Rate: 84-665  Rhythm: normal sinus rhythm, atrial fibrillation and premature ventricular contractions (PVC)  Lab Results:  Recent Labs  12/07/13 1630  WBC 5.3  HGB 13.0  PLT 165    Recent Labs  12/07/13 1630 12/09/13 2209  NA 138 139  K 4.5 3.8  CL 102 99  CO2 22 19  GLUCOSE 154* 216*  BUN 19 20  CREATININE 1.00 1.24   No results for input(s): TROPONINI in the last 72 hours.  Invalid input(s): CK, MB  Recent Labs  12/07/13 1630  INR 1.02    Imaging: Imaging results have been reviewed  Cardiac Studies: Echo done 12/4 pending  Assessment/Plan:  80 year old male with past medical history of coronary artery disease status post coronary artery bypass and graft admitted with recent presyncopal episodes and bradycardia. Verapamil and metoprolol initially held. Patient then developed runs of SVT with rapid ventricular response. Also with complaints of chest pain possibly secondary to SVT. Patient wants only conservative measures for his chest pain and no aggressive cardiac evaluation but would consider pacemaker if needed.  Principal Problem:   Symptomatic bradycardia Active Problems:   Angina  pectoris- rate related   Sick sinus syndrome   PAF (paroxysmal atrial fibrillation)   CABG '96   Aortic stenosis-mild with Nl LVF 2014   HYPERCHOLESTEROLEMIA   Essential hypertension   PLAN: Will review with MD- looks like he will need a pacemaker.   Kerin Ransom PA-C Beeper [phone] 12/10/2013, 8:59 AM

## 2013-12-10 NOTE — Progress Notes (Signed)
Pt has been ambulating in hallway with very little increase in heartrate

## 2013-12-10 NOTE — Progress Notes (Signed)
Utilization Review Completed.   Mathhew Buysse, RN, BSN Nurse Case Manager  

## 2013-12-11 ENCOUNTER — Encounter (HOSPITAL_COMMUNITY)
Admission: EM | Disposition: A | Discharge status: Nursing Home (Includes ICF) | Payer: Self-pay | Source: Home / Self Care | Attending: Cardiology

## 2013-12-11 DIAGNOSIS — I25710 Atherosclerosis of autologous vein coronary artery bypass graft(s) with unstable angina pectoris: Secondary | ICD-10-CM

## 2013-12-11 DIAGNOSIS — I209 Angina pectoris, unspecified: Secondary | ICD-10-CM

## 2013-12-11 HISTORY — PX: LEFT HEART CATHETERIZATION WITH CORONARY/GRAFT ANGIOGRAM: SHX5450

## 2013-12-11 HISTORY — PX: CARDIAC CATHETERIZATION: SHX172

## 2013-12-11 HISTORY — PX: CORONARY STENT PLACEMENT: SHX1402

## 2013-12-11 LAB — BASIC METABOLIC PANEL
Anion gap: 18 — ABNORMAL HIGH (ref 5–15)
BUN: 22 mg/dL (ref 6–23)
CHLORIDE: 104 meq/L (ref 96–112)
CO2: 19 mEq/L (ref 19–32)
CREATININE: 1.03 mg/dL (ref 0.50–1.35)
Calcium: 9.1 mg/dL (ref 8.4–10.5)
GFR, EST AFRICAN AMERICAN: 73 mL/min — AB (ref >90)
GFR, EST NON AFRICAN AMERICAN: 63 mL/min — AB (ref >90)
Glucose, Bld: 155 mg/dL — ABNORMAL HIGH (ref 70–99)
Potassium: 3.6 mEq/L — ABNORMAL LOW (ref 3.7–5.3)
Sodium: 141 mEq/L (ref 137–147)

## 2013-12-11 LAB — GLUCOSE, CAPILLARY
Glucose-Capillary: 160 mg/dL — ABNORMAL HIGH (ref 70–99)
Glucose-Capillary: 151 mg/dL — ABNORMAL HIGH (ref 70–99)
Glucose-Capillary: 133 mg/dL — ABNORMAL HIGH (ref 70–99)
Glucose-Capillary: 112 mg/dL — ABNORMAL HIGH (ref 70–99)

## 2013-12-11 LAB — MAGNESIUM: Magnesium: 1.8 mg/dL (ref 1.5–2.5)

## 2013-12-11 LAB — POCT ACTIVATED CLOTTING TIME: Activated Clotting Time: 270 seconds

## 2013-12-11 SURGERY — LEFT HEART CATHETERIZATION WITH CORONARY/GRAFT ANGIOGRAM
Anesthesia: LOCAL

## 2013-12-11 MED ORDER — ASPIRIN 81 MG PO CHEW
81.0000 mg | CHEWABLE_TABLET | Freq: Every day | ORAL | Status: DC
  Administered 2013-12-12 – 2013-12-13 (×2): 81 mg via ORAL
  Filled 2013-12-11 (×2): qty 1

## 2013-12-11 MED ORDER — SODIUM CHLORIDE 0.9 % IV SOLN: INTRAVENOUS | Status: AC

## 2013-12-11 MED ORDER — MIDAZOLAM HCL 2 MG/2ML IJ SOLN
INTRAMUSCULAR | Status: AC
  Filled 2013-12-11: qty 2

## 2013-12-11 MED ORDER — HYDRALAZINE HCL 20 MG/ML IJ SOLN
INTRAMUSCULAR | Status: AC
Start: 2013-12-11 — End: 2013-12-11
  Filled 2013-12-11: qty 1

## 2013-12-11 MED ORDER — VERAPAMIL HCL 2.5 MG/ML IV SOLN
INTRAVENOUS | Status: AC
  Filled 2013-12-11: qty 2

## 2013-12-11 MED ORDER — HEPARIN SODIUM (PORCINE) 1000 UNIT/ML IJ SOLN
INTRAMUSCULAR | Status: AC
  Filled 2013-12-11: qty 1

## 2013-12-11 MED ORDER — HYDRALAZINE HCL 50 MG PO TABS
50.0000 mg | ORAL_TABLET | Freq: Three times a day (TID) | ORAL | Status: DC
  Filled 2013-12-11: qty 1

## 2013-12-11 MED ORDER — SODIUM CHLORIDE 0.9 % IV SOLN
INTRAVENOUS | Status: DC
Start: 2013-12-11 — End: 2013-12-11
  Administered 2013-12-11: 06:00:00 via INTRAVENOUS

## 2013-12-11 MED ORDER — METOPROLOL TARTRATE 1 MG/ML IV SOLN
5.0000 mg | INTRAVENOUS | Status: AC
  Administered 2013-12-11: 5 mg via INTRAVENOUS
  Filled 2013-12-11 (×2): qty 5

## 2013-12-11 MED ORDER — CLOPIDOGREL BISULFATE 75 MG PO TABS
75.0000 mg | ORAL_TABLET | Freq: Every day | ORAL | Status: DC
  Administered 2013-12-12 – 2013-12-13 (×2): 75 mg via ORAL
  Filled 2013-12-11 (×2): qty 1

## 2013-12-11 MED ORDER — HEPARIN (PORCINE) IN NACL 2-0.9 UNIT/ML-% IJ SOLN
INTRAMUSCULAR | Status: AC
  Filled 2013-12-11: qty 1000

## 2013-12-11 MED ORDER — CLOPIDOGREL BISULFATE 300 MG PO TABS
ORAL_TABLET | ORAL | Status: AC
  Filled 2013-12-11: qty 1

## 2013-12-11 MED ORDER — NITROGLYCERIN 1 MG/10 ML FOR IR/CATH LAB
INTRA_ARTERIAL | Status: AC
  Filled 2013-12-11: qty 10

## 2013-12-11 MED ORDER — LIDOCAINE HCL (PF) 1 % IJ SOLN
INTRAMUSCULAR | Status: AC
  Filled 2013-12-11: qty 30

## 2013-12-11 NOTE — H&P (View-Only) (Signed)
    Subjective:  Chest pain last night- related to fast HR.  Objective:  Vital Signs in the last 24 hours: Temp:  [97.8 F (36.6 C)-98.5 F (36.9 C)] 98 F (36.7 C) (12/06 0501) Pulse Rate:  [67-100] 71 (12/06 0501) Resp:  [18] 18 (12/06 0501) BP: (111-161)/(56-96) 161/70 mmHg (12/06 0501) SpO2:  [97 %-98 %] 98 % (12/06 0501) Weight:  [160 lb 15 oz (73 kg)] 160 lb 15 oz (73 kg) (12/06 0501)  Intake/Output from previous day:  Intake/Output Summary (Last 24 hours) at 12/10/13 0859 Last data filed at 12/10/13 0518  Gross per 24 hour  Intake   1060 ml  Output    825 ml  Net    235 ml    Physical Exam: General appearance: alert, cooperative, no distress and HOH Lungs: clear to auscultation bilaterally Heart: regular rate and rhythm and 2/6 systolic murmur   Rate: 17-915  Rhythm: normal sinus rhythm, atrial fibrillation and premature ventricular contractions (PVC)  Lab Results:  Recent Labs  12/07/13 1630  WBC 5.3  HGB 13.0  PLT 165    Recent Labs  12/07/13 1630 12/09/13 2209  NA 138 139  K 4.5 3.8  CL 102 99  CO2 22 19  GLUCOSE 154* 216*  BUN 19 20  CREATININE 1.00 1.24   No results for input(s): TROPONINI in the last 72 hours.  Invalid input(s): CK, MB  Recent Labs  12/07/13 1630  INR 1.02    Imaging: Imaging results have been reviewed  Cardiac Studies: Echo done 12/4 pending  Assessment/Plan:  80 year old male with past medical history of coronary artery disease status post coronary artery bypass and graft admitted with recent presyncopal episodes and bradycardia. Verapamil and metoprolol initially held. Patient then developed runs of SVT with rapid ventricular response. Also with complaints of chest pain possibly secondary to SVT. Patient wants only conservative measures for his chest pain and no aggressive cardiac evaluation but would consider pacemaker if needed.  Principal Problem:   Symptomatic bradycardia Active Problems:   Angina  pectoris- rate related   Sick sinus syndrome   PAF (paroxysmal atrial fibrillation)   CABG '96   Aortic stenosis-mild with Nl LVF 2014   HYPERCHOLESTEROLEMIA   Essential hypertension   PLAN: Will review with MD- looks like he will need a pacemaker.   Kerin Ransom PA-C Beeper [phone] 12/10/2013, 8:59 AM

## 2013-12-11 NOTE — Progress Notes (Signed)
TR BAND REMOVAL  LOCATION:  right radial  DEFLATED PER PROTOCOL:  Yes.    TIME BAND OFF / DRESSING APPLIED:   1715   SITE UPON ARRIVAL:   Level 0  SITE AFTER BAND REMOVAL:  Level 0  REVERSE ALLEN'S TEST:    positive  CIRCULATION SENSATION AND MOVEMENT:  Within Normal Limits  Yes.    COMMENTS:  Delay in deflation due to bleeding site stable at this time

## 2013-12-11 NOTE — Progress Notes (Signed)
Pt's lab results called to Dr. Patric Dykes 3.6/Magnesium 1.8.  Also, Pt's bp is 217/51-200/60.  Pt is to get lopressor 25 po now.  Orders received to give po dose and give lopressor 5mg  iv now and in 5 mins if needed.  Will cont to monitor bp during shift.

## 2013-12-11 NOTE — Progress Notes (Signed)
Patient Name: Evan [name] Date of Encounter: 12/11/2013     Principal Problem:   Symptomatic bradycardia Active Problems:   HYPERCHOLESTEROLEMIA   Essential hypertension   CABG '96   Angina pectoris- rate related   Aortic stenosis-mild with Nl LVF 2014   Sick sinus syndrome   PAF (paroxysmal atrial fibrillation)   Tachy-brady syndrome    SUBJECTIVE  No CP or SOB overnight. Last episode CP was the night before last. States he is adament he does not want femoral approach for today's cath.   CURRENT MEDS . amLODipine  10 mg Oral Daily  . aspirin  324 mg Oral Once  . aspirin  325 mg Oral Daily  . [START ON 12/12/2013] enoxaparin (LOVENOX) injection  40 mg Subcutaneous Q24H  . finasteride  5 mg Oral Daily  . [START ON 12/12/2013] furosemide  40 mg Oral Daily  . isosorbide mononitrate  60 mg Oral Daily  . metoprolol tartrate  25 mg Oral BID  . pantoprazole  40 mg Oral Daily  . sodium chloride  3 mL Intravenous Q12H  . trandolapril  2 mg Oral Daily    OBJECTIVE  Filed Vitals:   12/10/13 0956 12/10/13 1412 12/10/13 1943 12/11/13 0519  BP: 150/60 125/52 155/73 166/81  Pulse:  69 77 73  Temp:  98.5 F (36.9 C) 98.5 F (36.9 C) 98.4 F (36.9 C)  TempSrc:  Oral Oral Oral  Resp:  20 20 18   Height:      Weight:    160 lb 11.5 oz (72.9 kg)  SpO2:  98% 97% 100%    Intake/Output Summary (Last 24 hours) at 12/11/13 0751 Last data filed at 12/11/13 1610  Gross per 24 hour  Intake    700 ml  Output    550 ml  Net    150 ml   Filed Weights   12/09/13 0512 12/10/13 0501 12/11/13 0519  Weight: 168 lb 7.3 oz (76.41 kg) 160 lb 15 oz (73 kg) 160 lb 11.5 oz (72.9 kg)    PHYSICAL EXAM  General: Pleasant, NAD. Neuro: Alert and oriented X 3. Moves all extremities spontaneously. Psych: Normal affect. HEENT:  Normal  Neck: Supple without bruits or JVD. Lungs:  Resp regular and unlabored, CTA. Heart: Regularly irregular no s3, s4. 1/6 systolic murmur Abdomen: Soft,  non-tender, non-distended, BS + x 4.  Extremities: No clubbing, cyanosis or edema. DP/PT/Radials 2+ and equal bilaterally.  Accessory Clinical Findings  CBC No results for input(s): WBC, NEUTROABS, HGB, HCT, MCV, PLT in the last 72 hours. Basic Metabolic Panel  Recent Labs  12/09/13 2209  NA 139  K 3.8  CL 99  CO2 19  GLUCOSE 216*  BUN 20  CREATININE 1.24  CALCIUM 9.5  MG 1.7    TELE NSR with 1st degree heart block, HR 60-70s, occasional 40s with frequent missed beats. Occasional burst of supraventricular tachycardia, nonsustained.    ECG  No new EKG  Echocardiogram  pending    Radiology/Studies  Dg Chest Portable 1 View  12/07/2013   CLINICAL DATA:  Chest pain and bradycardia.  EXAM: PORTABLE CHEST - 1 VIEW  COMPARISON:  12/12/2010  FINDINGS: Prior CABG. Prominent left epicardial adipose tissue. Low lung volumes are present, causing crowding of the pulmonary vasculature. Mildly enlarged cardiopericardial silhouette. Atherosclerotic aortic arch.  IMPRESSION: 1. Attribute the indistinctness of the left hemidiaphragm to the low lung volumes and prominent epicardial adipose tissue. I doubt that there is a significant left lower lobe  airspace opacity. Lateral chest radiography could provide further assurance, an would be recommended if left basilar auscultation is abnormal. 2. Mild cardiomegaly.  Prior CABG.   Electronically Signed   By: Sherryl Barters M.D.   On: 12/07/2013 16:32    ASSESSMENT AND PLAN  1 Symptomatic bradycardia with intermittent SVT - likely tachy-brady syndrome  - held metoprolol and verapamil at the time of admission.   - bradycardia improved however, has runs of SVT with a rapid ventricular response.   - Lower dose metoprolol reintroduced over the weekend for tachycardia, monitor for significant bradycardia.  - likely need pacemaker, however depend on cath finding today.   2. Hypertension: will need reassess, BP 120-160s on amlodipine, Imdur,  Trandolapril and metoprolol  3. chest pain - possibly secondary to recurrent SVT. Enzymes negative.   - initially did not want cath due to bleeding on femoral approach 20 yrs ago, however agreed to radial approach. H/o CABG. Consider L radial approach.  - if not significant underlying coronary disease to explain tachy-brady, will consult EP today for possible pacemaker.  4. History of aortic stenosis-repeat echo pending. Done yesterday, issue with EPIC over the weekend delayed reading. Will see if can be read today  5. CAD s/p CABG 1996 - continue aspirin  Signed, Almyra Deforest PA-C Pager: [phone]  I have personally seen and examined this patient with Almyra Deforest, PA-C. I agree with the assessment and plan as outlined above. Cardiac cath today. EP consult for possible pacemaker.   MCALHANY,CHRISTOPHER 12/11/2013 12:08 PM

## 2013-12-11 NOTE — Progress Notes (Signed)
Patient has been transferred to [facility] after a heart  Cath.  Spoke with his daughter [name] this afternoon. She is aware of move to [facility] and stated that she has brought up some of his belongings and wants to make sure that they get to him.CSW spoke with the unit secretary who stated that they have sent his belonging to his room on [address].  CSW will provide a handoff report to receiving CSW- Crawford Givens for review and will sign off.  Lorie Phenix. Pauline Good, [place]

## 2013-12-11 NOTE — Interval H&P Note (Signed)
History and Physical Interval Note:  12/11/2013 10:24 AM  Evan [name]  has presented today for cardiac cath with the diagnosis of unstable angina, CAD s/p CABG. The various methods of treatment have been discussed with the patient and family. After consideration of risks, benefits and other options for treatment, the patient has consented to  Procedure(s): LEFT HEART CATHETERIZATION WITH CORONARY/GRAFT ANGIOGRAM (N/A) as a surgical intervention .  The patient's history has been reviewed, patient examined, no change in status, stable for surgery.  I have reviewed the patient's chart and labs.  Questions were answered to the patient's satisfaction.    Cath Lab Visit (complete for each Cath Lab visit)  Clinical Evaluation Leading to the Procedure:   ACS: No.  Non-ACS:    Anginal Classification: CCS III  Anti-ischemic medical therapy: Maximal Therapy (2 or more classes of medications)  Non-Invasive Test Results: No non-invasive testing performed  Prior CABG: Previous CABG        Evan [name]

## 2013-12-11 NOTE — Progress Notes (Signed)
Notified Cecilie Kicks P.A. Patient irregular on monitor NSR,Afib,PVC's and NSVT.and he is stable SBP 130-160. K+ and Mg ordered to follow up. Patient denies any pain or discomfort.

## 2013-12-11 NOTE — CV Procedure (Signed)
Cardiac Catheterization Operative Report  Evan [name] [id number] 12/7/201511:43 AM Horatio Pel, MD  Procedure Performed:  1. Left Heart Catheterization 2. Selective Coronary Angiography 3. SVG angiography 4. PTCA/DES x 1 mid body of SVG to OM1/OM2  Operator: Lauree Chandler, MD  Arterial access site:  Left radial artery.   Indication:  80 yo male with CAD s/p CABG with recent chest pain c/w unstable angina.                                      Procedure Details: The risks, benefits, complications, treatment options, and expected outcomes were discussed with the patient. The patient and/or family concurred with the proposed plan, giving informed consent. The patient was brought to the cath lab after IV hydration was begun and oral premedication was given. The patient was further sedated with Versed. The left wrist was assessed with a modified Allens test which was positive. The left wrist was prepped and draped in a sterile fashion. 1% lidocaine was used for local anesthesia. Using the modified Seldinger access technique, a 5 French sheath was placed in the left radial artery. 3 mg Verapamil was given through the sheath. 4000 units IV heparin was given. Standard diagnostic catheters were used to perform selective coronary angiography. The JR4 was used to engage all four vein grafts and the RCA. The JL3.5 catheter was used to engage the left main. A pigtail catheter was used to cross the aortic valve. LV pressures measured. No LV gram performed. I elected to proceed to PCI of the mid body of SVG to OM system.   PCI Note: He was given an additional 5000 units IV heparin. ACT was over 200. Plavix 600 mg po x 1. The SVG to OM was engaged with a LCB guiding catheter. I then advanced a Cougar IC wire down the SVG to the OM system. A 4.0 Spider filter protection device was deployed in the mid body of the vein graft just before the anastomosis to OM1. The Cougar wire was  removed. I then pre-dilated the severe stenosis in the mid body of the SVG with a 2.5 x 12 mm balloon. A 3.5 x 15 mm Resolute DES was deployed in the mid body of the SVG. The Spider filter wire was removed.   The sheath was removed from the left radial artery and a hemostasis band was applied at the arteriotomy site on the right wrist. There were no immediate complications. The patient was taken to the recovery area in stable condition.   Hemodynamic Findings: Central aortic pressure: 170/59 Left ventricular pressure: 191/5/13  Angiographic Findings:  Left main: 30% distal stenosis.   Left Anterior Descending Artery: 100% proximal occlusion. Mid and distal LAD fills from the patent vein graft. The diagonal fills from the patent vein graft.   Circumflex Artery: 100% proximal occlusion. The first and second OM branches fill from the patent vein graft.   Right Coronary Artery: Large dominant vessel with diffuse 99% stenosis in the proximal, mid and distal vessel. The PDA and PLA fill from the patent vein graft.   Graft Anatomy:  SVG to PDA is patent SVG to OM1/OM2 is patent with 95% stenosis mid body of SVG SVG to LAD is patent SVG to Diagonal is patent  Left Ventricular Angiogram: Deferred.   Impression: 1. Severe triple vessel disease s/p 5 vessel CABG with 5/5 patent bypass grafts.  2. Severe stenosis mid body of SVG to OM 3. Unstable angina 4. Successful PTCA/DES x 1 mid body of SVG to OM1/OM2  Recommendations: Continue ASA and Plavix for one year.        Complications:  None. The patient tolerated the procedure well.

## 2013-12-12 ENCOUNTER — Encounter (HOSPITAL_COMMUNITY)
Admission: EM | Disposition: A | Discharge status: Nursing Home (Includes ICF) | Payer: Self-pay | Source: Home / Self Care | Attending: Cardiology

## 2013-12-12 ENCOUNTER — Encounter (HOSPITAL_COMMUNITY): Payer: Self-pay | Admitting: Internal Medicine

## 2013-12-12 DIAGNOSIS — I453 Trifascicular block: Secondary | ICD-10-CM | POA: Diagnosis present

## 2013-12-12 DIAGNOSIS — I459 Conduction disorder, unspecified: Secondary | ICD-10-CM

## 2013-12-12 DIAGNOSIS — I441 Atrioventricular block, second degree: Secondary | ICD-10-CM

## 2013-12-12 DIAGNOSIS — R55 Syncope and collapse: Secondary | ICD-10-CM | POA: Diagnosis present

## 2013-12-12 HISTORY — PX: PERMANENT PACEMAKER INSERTION: SHX5480

## 2013-12-12 LAB — BASIC METABOLIC PANEL
ANION GAP: 16 — AB (ref 5–15)
BUN: 18 mg/dL (ref 6–23)
CO2: 21 mEq/L (ref 19–32)
CREATININE: 1.12 mg/dL (ref 0.50–1.35)
Calcium: 9.2 mg/dL (ref 8.4–10.5)
Chloride: 106 mEq/L (ref 96–112)
GFR calc non Af Amer: 57 mL/min — ABNORMAL LOW (ref >90)
GFR, EST AFRICAN AMERICAN: 66 mL/min — AB (ref >90)
Glucose, Bld: 141 mg/dL — ABNORMAL HIGH (ref 70–99)
POTASSIUM: 4 meq/L (ref 3.7–5.3)
Sodium: 143 mEq/L (ref 137–147)

## 2013-12-12 LAB — CBC
HCT: 41.2 % (ref 39.0–52.0)
Hemoglobin: 13.5 g/dL (ref 13.0–17.0)
MCH: 27 pg (ref 26.0–34.0)
MCHC: 32.8 g/dL (ref 30.0–36.0)
MCV: 82.4 fL (ref 78.0–100.0)
Platelets: 173 10*3/uL (ref 150–400)
RBC: 5 MIL/uL (ref 4.22–5.81)
RDW: 15.2 % (ref 11.5–15.5)
WBC: 4.8 10*3/uL (ref 4.0–10.5)

## 2013-12-12 LAB — GLUCOSE, CAPILLARY
Glucose-Capillary: 166 mg/dL — ABNORMAL HIGH (ref 70–99)
Glucose-Capillary: 153 mg/dL — ABNORMAL HIGH (ref 70–99)
Glucose-Capillary: 131 mg/dL — ABNORMAL HIGH (ref 70–99)

## 2013-12-12 LAB — SURGICAL PCR SCREEN
MRSA, PCR: POSITIVE — AB
Staphylococcus aureus: POSITIVE — AB

## 2013-12-12 SURGERY — PERMANENT PACEMAKER INSERTION
Anesthesia: LOCAL

## 2013-12-12 MED ORDER — AMIODARONE HCL 200 MG PO TABS
200.0000 mg | ORAL_TABLET | Freq: Two times a day (BID) | ORAL | Status: DC
  Administered 2013-12-12 – 2013-12-13 (×2): 200 mg via ORAL
  Filled 2013-12-12 (×3): qty 1

## 2013-12-12 MED ORDER — SODIUM CHLORIDE 0.9 % IJ SOLN
3.0000 mL | Freq: Two times a day (BID) | INTRAMUSCULAR | Status: DC
  Administered 2013-12-12 – 2013-12-13 (×2): 3 mL via INTRAVENOUS

## 2013-12-12 MED ORDER — HYDROCODONE-ACETAMINOPHEN 5-325 MG PO TABS: 1.0000 | ORAL_TABLET | ORAL | Status: DC | PRN

## 2013-12-12 MED ORDER — CHLORHEXIDINE GLUCONATE 4 % EX LIQD
60.0000 mL | Freq: Once | CUTANEOUS | Status: AC
  Administered 2013-12-12: 4 via TOPICAL

## 2013-12-12 MED ORDER — HEPARIN (PORCINE) IN NACL 2-0.9 UNIT/ML-% IJ SOLN
INTRAMUSCULAR | Status: AC
  Filled 2013-12-12: qty 500

## 2013-12-12 MED ORDER — CHLORHEXIDINE GLUCONATE 4 % EX LIQD
CUTANEOUS | Status: AC
  Filled 2013-12-12: qty 15

## 2013-12-12 MED ORDER — HYDROCORTISONE 1 % EX CREA
TOPICAL_CREAM | Freq: Four times a day (QID) | CUTANEOUS | Status: DC
  Administered 2013-12-12 – 2013-12-13 (×4): via TOPICAL
  Filled 2013-12-12: qty 28

## 2013-12-12 MED ORDER — CARVEDILOL 6.25 MG PO TABS
6.2500 mg | ORAL_TABLET | Freq: Two times a day (BID) | ORAL | Status: DC
  Administered 2013-12-12 – 2013-12-13 (×2): 6.25 mg via ORAL
  Filled 2013-12-12 (×4): qty 1

## 2013-12-12 MED ORDER — SODIUM CHLORIDE 0.9 % IR SOLN
80.0000 mg | Status: DC
  Filled 2013-12-12: qty 2

## 2013-12-12 MED ORDER — SODIUM CHLORIDE 0.9 % IV SOLN: INTRAVENOUS | Status: DC

## 2013-12-12 MED ORDER — CEFAZOLIN SODIUM-DEXTROSE 2-3 GM-% IV SOLR
INTRAVENOUS | Status: AC
  Filled 2013-12-12: qty 50

## 2013-12-12 MED ORDER — MUPIROCIN 2 % EX OINT
1.0000 "application " | TOPICAL_OINTMENT | Freq: Two times a day (BID) | CUTANEOUS | Status: DC
  Administered 2013-12-12 – 2013-12-13 (×2): 1 via NASAL
  Filled 2013-12-12: qty 22

## 2013-12-12 MED ORDER — CEFAZOLIN SODIUM 1-5 GM-% IV SOLN
INTRAVENOUS | Status: AC
  Filled 2013-12-12: qty 50

## 2013-12-12 MED ORDER — SODIUM CHLORIDE 0.9 % IJ SOLN: 3.0000 mL | INTRAMUSCULAR | Status: DC | PRN

## 2013-12-12 MED ORDER — HYDRALAZINE HCL 50 MG PO TABS
50.0000 mg | ORAL_TABLET | Freq: Three times a day (TID) | ORAL | Status: DC
  Administered 2013-12-12 – 2013-12-13 (×5): 50 mg via ORAL
  Filled 2013-12-12 (×7): qty 1

## 2013-12-12 MED ORDER — FENTANYL CITRATE 0.05 MG/ML IJ SOLN
INTRAMUSCULAR | Status: AC
  Filled 2013-12-12: qty 2

## 2013-12-12 MED ORDER — SODIUM CHLORIDE 0.9 % IV SOLN: 250.0000 mL | INTRAVENOUS | Status: DC | PRN

## 2013-12-12 MED ORDER — ACETAMINOPHEN 325 MG PO TABS: 325.0000 mg | ORAL_TABLET | ORAL | Status: DC | PRN

## 2013-12-12 MED ORDER — LIDOCAINE HCL (PF) 1 % IJ SOLN
INTRAMUSCULAR | Status: AC
  Filled 2013-12-12: qty 60

## 2013-12-12 MED ORDER — ONDANSETRON HCL 4 MG/2ML IJ SOLN: 4.0000 mg | Freq: Four times a day (QID) | INTRAMUSCULAR | Status: DC | PRN

## 2013-12-12 MED ORDER — SODIUM CHLORIDE 0.45 % IV SOLN: INTRAVENOUS | Status: DC

## 2013-12-12 MED ORDER — CHLORHEXIDINE GLUCONATE CLOTH 2 % EX PADS
6.0000 | MEDICATED_PAD | Freq: Every day | CUTANEOUS | Status: DC
  Administered 2013-12-13: 6 via TOPICAL

## 2013-12-12 MED ORDER — MIDAZOLAM HCL 5 MG/5ML IJ SOLN
INTRAMUSCULAR | Status: AC
  Filled 2013-12-12: qty 5

## 2013-12-12 MED ORDER — YOU HAVE A PACEMAKER BOOK
Freq: Once | Status: AC
  Administered 2013-12-12
  Filled 2013-12-12: qty 1

## 2013-12-12 MED ORDER — CEFAZOLIN SODIUM-DEXTROSE 2-3 GM-% IV SOLR
2.0000 g | INTRAVENOUS | Status: DC
  Filled 2013-12-12: qty 50

## 2013-12-12 MED ORDER — CEFAZOLIN SODIUM 1-5 GM-% IV SOLN
1.0000 g | Freq: Four times a day (QID) | INTRAVENOUS | Status: AC
  Administered 2013-12-12 – 2013-12-13 (×3): 1 g via INTRAVENOUS
  Filled 2013-12-12 (×4): qty 50

## 2013-12-12 NOTE — Plan of Care (Signed)
Problem: Consults Goal: Permanent Pacemaker Patient Education (See Patient Education module for education specifics.) Outcome: Completed/Met Date Met:  12/12/13 Goal: Skin Care Protocol Initiated - if Braden Score 18 or less If consults are not indicated, leave blank or document N/A Outcome: Completed/Met Date Met:  12/12/13  Problem: Phase II Progression Outcomes Goal: Antibiotics sent with patient Outcome: Completed/Met Date Met:  12/12/13 Goal: Pacer site without bleeding/hematoma Outcome: Completed/Met Date Met:  12/12/13 Goal: No evidence of pacemaker malfunction Outcome: Completed/Met Date Met:  12/12/13 Goal: Hemodynamically stable Outcome: Completed/Met Date Met:  12/12/13 Goal: Pain controlled Outcome: Completed/Met Date Met:  12/12/13  Problem: Phase III Progression Outcomes Goal: Tolerating diet Outcome: Completed/Met Date Met:  12/12/13 Goal: Hemodynamically stable Outcome: Completed/Met Date Met:  12/12/13 Goal: Ambulating in room or hall Outcome: Completed/Met Date Met:  12/12/13 Goal: Limited arm movement per orders Outcome: Completed/Met Date Met:  12/12/13

## 2013-12-12 NOTE — Plan of Care (Signed)
Problem: Consults Goal: PCI Patient Education (See Patient Education module for education specifics.)  Outcome: Completed/Met Date Met:  12/12/13 Goal: Diabetes Guidelines if Diabetic/Glucose > 140 If diabetic or lab glucose is > 140 mg/dl - Initiate Diabetes/Hyperglycemia Guidelines & Document Interventions  Outcome: Completed/Met Date Met:  12/12/13  Problem: Phase I Progression Outcomes Goal: Procedure Consent complete Outcome: Completed/Met Date Met:  12/12/13 Goal: 12 lead ECG and Lab on chart Outcome: Completed/Met Date Met:  12/12/13 Goal: Distal pulses assessed/charted Outcome: Completed/Met Date Met:  12/12/13 Goal: Other Phase I Outcomes/Goals Outcome: Not Applicable Date Met:  50/53/97  Problem: Phase II Progression Outcomes Goal: Other Phase II Outcomes/Goals Outcome: Not Applicable Date Met:  67/34/19

## 2013-12-12 NOTE — Interval H&P Note (Signed)
History and Physical Interval Note:  12/12/2013 2:41 PM  [place]  has presented today for surgery, with the diagnosis of bradicardia  The various methods of treatment have been discussed with the patient and family. After consideration of risks, benefits and other options for treatment, the patient has consented to  Procedure(s): PERMANENT PACEMAKER INSERTION (N/A) as a surgical intervention .  The patient's history has been reviewed, patient examined, no change in status, stable for surgery.  I have reviewed the patient's chart and labs.  Questions were answered to the patient's satisfaction.     Thompson Grayer

## 2013-12-12 NOTE — Progress Notes (Signed)
BP is now 160/63.

## 2013-12-12 NOTE — Plan of Care (Signed)
Problem: Phase I Progression Outcomes Goal: Pain controlled with appropriate interventions Outcome: Completed/Met Date Met:  12/12/13 Goal: OOB as tolerated unless otherwise ordered Outcome: Completed/Met Date Met:  12/12/13 Goal: Initial discharge plan identified Outcome: Completed/Met Date Met:  12/12/13 Goal: Hemodynamically stable Outcome: Completed/Met Date Met:  12/12/13  Problem: Phase II Progression Outcomes Goal: Pain controlled Outcome: Completed/Met Date Met:  12/12/13 Goal: OOB to chair per PCI oders Outcome: Completed/Met Date Met:  12/12/13 Goal: Vascular site scale level 0 - I Vascular Site Scale Level 0: No bruising/bleeding/hematoma Level I (Mild): Bruising/Ecchymosis, minimal bleeding/ooozing, palpable hematoma < 3 cm Level II (Moderate): Bleeding not affecting hemodynamic parameters, pseudoaneurysm, palpable hematoma > 3 cm Level III (Severe) Bleeding which affects hemodynamic parameters or retroperitoneal hemorrhage  Outcome: Completed/Met Date Met:  12/12/13 Goal: No post PCI angina Outcome: Completed/Met Date Met:  12/12/13 Goal: Distal pulses equal baseline assessment Outcome: Completed/Met Date Met:  12/12/13 Goal: Discharge plan established Outcome: Completed/Met Date Met:  12/12/13 Goal: Tolerating diet Outcome: Completed/Met Date Met:  12/12/13

## 2013-12-12 NOTE — Progress Notes (Signed)
After ambulating  with Cardiac Rehab, 0918 patient's BP was 251/71 (139) (sitting), after several minutes 0926 195/57 (109).  At 0900 BP was 177/51, Patient was given the following meds at 0900: Norvasc 10mg  Hydralazine 50mg  Metoprolol 25mg  Isosorbide Mononitrate 60mg  Mavik 2mg   Will recheck BP at 1000 and reevaluate patient then.

## 2013-12-12 NOTE — H&P (View-Only) (Signed)
ELECTROPHYSIOLOGY CONSULT NOTE    Primary Care Physician: Horatio Pel, MD Referring Physician:  Dr Angelena Form  Admit Date: 12/07/2013  Reason for consultation:  AV block, bradycardia, syncope  Evan [name] is a 80 y.o. male with a h/o CAD, trifascicular block, and atrial arrhythmias for which EP is consulted. He is followed in the clinic by Dr Johnsie Cancel and chronically AV nodal agents have been avoided due to trifascicular block. Over the past few weeks, the patient has begun having abrupt episodes of presyncope.  He has been documented to have second degree AV block with V rates into the 40s at times.  He has frequent associated dizziness.  He also reports exertional chest pain. He underwent cath yesterday (Dr Camillia Herter note is reviewed) with PCI.  He continues to have episodes of second degree AV block as well as frequent ectopic atach/ afib with V rates at 200 bpm.  Elevated V rates are more prominent with ambulation and are associated with chest discomfort.  He also has very elevated blood pressure.  Today, he denies symptoms of palpitations,shortness of breath, orthopnea, PND, lower extremity edema, or neurologic sequela. The patient is tolerating medications without difficulties and is otherwise without complaint today.   Past Medical History  Diagnosis Date  . HYPERTENSION   . HYPERCHOLESTEROLEMIA   . CAD   . TRANSIENT ISCHEMIC ATTACKS, HX OF   . NEPHROLITHIASIS, HX OF   . GERD   . DM   . Trifascicular block   . Syncope   . ALLERGIC RHINITIS   . Paroxysmal atrial fibrillation   . Atrial tachycardia    Past Surgical History  Procedure Laterality Date  . Ptca  1980's  . Coronary artery bypass graft  1996  . Back surgery  2000'S  . Av fistula repair    . 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 amLODipine  10 mg Oral Daily  . aspirin  324 mg Oral Once  . aspirin  81 mg Oral Daily  . clopidogrel  75 mg Oral Q breakfast  .  finasteride  5 mg Oral Daily  . furosemide  40 mg Oral Daily  . hydrALAZINE  50 mg Oral TID  . hydrocortisone cream   Topical QID  . isosorbide mononitrate  60 mg Oral Daily  . metoprolol tartrate  25 mg Oral BID  . pantoprazole  40 mg Oral Daily  . sodium chloride  3 mL Intravenous Q12H  . trandolapril  2 mg Oral Daily      Allergies  Allergen Reactions  . Demerol   . Meperidine Hcl     REACTION: Hypotension    History   Social History  . Marital Status: Married    Spouse Name: N/A    Number of Children: N/A  . Years of Education: N/A   Occupational History  . Not on file.   Social History Main Topics  . Smoking status: Former Smoker    Quit date: 01/06/1956  . Smokeless tobacco: Not on file  . Alcohol Use: No  . Drug Use: No  . Sexual Activity: Not on file   Other Topics Concern  . Not on file   Social History Narrative   Pt is married and lives with his wife.  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      Family History  Problem Relation Age of Onset  . Diabetes  Mother   . Coronary artery disease Father     ROS- All systems are reviewed and negative except as per the HPI above  Physical Exam: Telemetry: Filed Vitals:   12/12/13 0900 12/12/13 0918 12/12/13 0926 12/12/13 1039  BP: 177/51 251/71 195/57 130/47  Pulse: 69 79 69 74  Temp:      TempSrc:      Resp:      Height:      Weight:      SpO2:        GEN- The patient is elderly appearing, alert and oriented x 3 today.   Head- normocephalic, atraumatic Eyes-  Sclera clear, conjunctiva pink Ears- very poor hearing Oropharynx- clear Neck- supple,   Lungs- Clear to ausculation bilaterally, normal work of breathing Heart- Regular rate and rhythm  GI- soft, NT, ND, + BS Extremities- no clubbing, cyanosis, or edema MS- diffuse muscle atrophy Skin- no rash or lesion Psych- euthymic mood, full affect Neuro- strength and sensation are intact  EKG  reviewed, reveals trifascicular block going back several years.  Atrial fibrillation with RVR is also noted Echo 12/15 is reviewed Cath from Dr Angelena Form is reviewed  Labs:   Lab Results  Component Value Date   WBC 4.8 12/12/2013   HGB 13.5 12/12/2013   HCT 41.2 12/12/2013   MCV 82.4 12/12/2013   PLT 173 12/12/2013    Recent Labs Lab 12/07/13 1630  12/12/13 0339  NA 138  < > 143  K 4.5  < > 4.0  CL 102  < > 106  CO2 22  < > 21  BUN 19  < > 18  CREATININE 1.00  < > 1.12  CALCIUM 9.2  < > 9.2  PROT 6.8  --   --   BILITOT 0.3  --   --   ALKPHOS 77  --   --   ALT 18  --   --   AST 21  --   --   GLUCOSE 154*  < > 141*  < > = values in this interval not displayed. No results found for: CKTOTAL, CKMB, CKMBINDEX, TROPONINI No results found for: CHOL No results found for: HDL No results found for: LDLCALC No results found for: TRIG No results found for: CHOLHDL No results found for: LDLDIRECT    ASSESSMENT AND PLAN:   1. Symptomatic second degree AV block The patient has chronic trifascicular block and avoidance of AV nodal agents has been tried.  Recently he has begun having symptomatic second degree AV block with presyncope and dizziness.  He also has AF and atach with RVR which necessitate AV nodal agents and likely AAD therapy long term.   The patient has symptomatic bradycardia without reversible cause.  I would therefore recommend pacemaker implantation at this time.  Risks, benefits, alternatives to pacemaker implantation were discussed in detail with the patient today. The patient understands that the risks include but are not limited to bleeding, infection, pneumothorax, perforation, tamponade, vascular damage, renal failure, MI, stroke, death,  and lead dislodgement and wishes to proceed at this time.  2. Atach/ AF Restart AV nodal agents after pacemaker is in place Will consider amiodarone for rate control of his very fast and symptomatic atrial arrhythmias Given  advanced age and comorbidities, I would be reluctant to consider anticoagulation at this time though his chads2vasc score is at least 8.  This can be further addressed by Dr Johnsie Cancel in the future.  3. Hypertension Very elevated BP Improved  with hydralazine Will continue to follow closely We will have more options for BP control after PPM implant  Thompson Grayer, MD 12/12/2013  11:47 AM

## 2013-12-12 NOTE — Progress Notes (Signed)
CARDIAC REHAB PHASE I   PRE:  Rate/Rhythm: 73 afib    BP: sitting 177/51    SaO2:   MODE:  Ambulation: 300 ft   POST:  Rate/Rhythm: 120s runs NSVT (3-5)    BP: sitting 251/71, 8 min rest 195/57      SaO2:   Pt moving very well, uses RW PTA. No LOB or weakness noted. HR increased with activity, having short runs NSVT. Pt initially asymptomatic but developed chest tightness toward end of walk. 3/10 at the worst. Resolved with rest. BP very elevated after walking, 251/71. NSVT resolved with rest, pt back to controlled rate in 70s (?afib v/s JR). Discussed Plavix and NTG. Will f/u. [phone]   Josephina Shih [place] CES, ACSM 12/12/2013 9:42 AM

## 2013-12-12 NOTE — Progress Notes (Addendum)
[facility] per patient's request about cancelling his appointment for tomorrow since he will be in the hospital.

## 2013-12-12 NOTE — Op Note (Signed)
SURGEON:  Thompson Grayer, MD     PREPROCEDURE DIAGNOSIS:  Symptomatic second degree AV block with syncope    POSTPROCEDURE DIAGNOSIS:  Symptomatic second degree AV block with syncope     PROCEDURES:   1.  Pacemaker implantation.     INTRODUCTION: Evan [name] is a 80 y.o. male  with a history of symptomatic second degree AV block who presents today for pacemaker implantation.  The patient reports intermittent episodes of dizziness and presyncope over the past few weeks.  He has chronic trifascicular block and presents with frequent 2:1 AV block.  He also has AF with RVR requiring AV nodal agents.  No reversible causes have been identified.  The patient therefore presents today for pacemaker implantation.     DESCRIPTION OF PROCEDURE:  Informed written consent was obtained, and the patient was brought to the electrophysiology lab in a fasting state.  The patient required no sedation for the procedure today.  The patients left chest was prepped and draped in the usual sterile fashion by the EP lab staff. The skin overlying the left deltopectoral region was infiltrated with lidocaine for local analgesia.  A 4-cm incision was made over the left deltopectoral region.  A left subcutaneous pacemaker pocket was fashioned using a combination of sharp and blunt dissection. Electrocautery was required to assure hemostasis.    RA/RV Lead Placement: The left axillary vein was cannulated.  Through the left axillary vein, a Medtronic model [id number] (serial number [id number]) right atrial lead and a Medtronic model [id number] (serial number [id number]) right ventricular lead were advanced with fluoroscopic visualization into the right atrial appendage and right ventricular apex positions respectively.  Initial atrial lead P- waves measured 3.2 mV with impedance of 698 ohms and a threshold of 1.2 V at 0.5 msec.  Right ventricular lead R-waves measured 26 mV with an impedance of 744 ohms and a threshold of 0.3 V at  0.5 msec.  Both leads were secured to the pectoralis fascia using #2-0 silk over the suture sleeves.   Device Placement:  The leads were then connected to a Medtronic Adapta L model ADDRL 1 (serial number [id number]) pacemaker.  The pocket was irrigated with copious gentamicin solution.  The pacemaker was then placed into the pocket.  The pocket was then closed in 2 layers with 2.0 Vicryl suture for the subcutaneous and subcuticular layers.  Steri- Strips and a sterile dressing were then applied. EBL<31ml. There were no early apparent complications.     CONCLUSIONS:   1. Successful implantation of a Medtronic Adapta L dual-chamber pacemaker for symptomatic second degree AV block with syncope  2. No early apparent complications.           Thompson Grayer, MD 12/12/2013 5:01 PM

## 2013-12-12 NOTE — Consult Note (Signed)
ELECTROPHYSIOLOGY CONSULT NOTE    Primary Care Physician: Horatio Pel, MD Referring Physician:  Dr Angelena Form  Admit Date: 12/07/2013  Reason for consultation:  AV block, bradycardia, syncope  Evan [name] is a 80 y.o. male with a h/o CAD, trifascicular block, and atrial arrhythmias for which EP is consulted. He is followed in the clinic by Dr Johnsie Cancel and chronically AV nodal agents have been avoided due to trifascicular block. Over the past few weeks, the patient has begun having abrupt episodes of presyncope.  He has been documented to have second degree AV block with V rates into the 40s at times.  He has frequent associated dizziness.  He also reports exertional chest pain. He underwent cath yesterday (Dr Camillia Herter note is reviewed) with PCI.  He continues to have episodes of second degree AV block as well as frequent ectopic atach/ afib with V rates at 200 bpm.  Elevated V rates are more prominent with ambulation and are associated with chest discomfort.  He also has very elevated blood pressure.  Today, he denies symptoms of palpitations,shortness of breath, orthopnea, PND, lower extremity edema, or neurologic sequela. The patient is tolerating medications without difficulties and is otherwise without complaint today.   Past Medical History  Diagnosis Date  . HYPERTENSION   . HYPERCHOLESTEROLEMIA   . CAD   . TRANSIENT ISCHEMIC ATTACKS, HX OF   . NEPHROLITHIASIS, HX OF   . GERD   . DM   . Trifascicular block   . Syncope   . ALLERGIC RHINITIS   . Paroxysmal atrial fibrillation   . Atrial tachycardia    Past Surgical History  Procedure Laterality Date  . Ptca  1980's  . Coronary artery bypass graft  1996  . Back surgery  2000'S  . Av fistula repair    . 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 amLODipine  10 mg Oral Daily  . aspirin  324 mg Oral Once  . aspirin  81 mg Oral Daily  . clopidogrel  75 mg Oral Q breakfast  .  finasteride  5 mg Oral Daily  . furosemide  40 mg Oral Daily  . hydrALAZINE  50 mg Oral TID  . hydrocortisone cream   Topical QID  . isosorbide mononitrate  60 mg Oral Daily  . metoprolol tartrate  25 mg Oral BID  . pantoprazole  40 mg Oral Daily  . sodium chloride  3 mL Intravenous Q12H  . trandolapril  2 mg Oral Daily      Allergies  Allergen Reactions  . Demerol   . Meperidine Hcl     REACTION: Hypotension    History   Social History  . Marital Status: Married    Spouse Name: N/A    Number of Children: N/A  . Years of Education: N/A   Occupational History  . Not on file.   Social History Main Topics  . Smoking status: Former Smoker    Quit date: 01/06/1956  . Smokeless tobacco: Not on file  . Alcohol Use: No  . Drug Use: No  . Sexual Activity: Not on file   Other Topics Concern  . Not on file   Social History Narrative   Pt is married and lives with his wife.  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      Family History  Problem Relation Age of Onset  . Diabetes  Mother   . Coronary artery disease Father     ROS- All systems are reviewed and negative except as per the HPI above  Physical Exam: Telemetry: Filed Vitals:   12/12/13 0900 12/12/13 0918 12/12/13 0926 12/12/13 1039  BP: 177/51 251/71 195/57 130/47  Pulse: 69 79 69 74  Temp:      TempSrc:      Resp:      Height:      Weight:      SpO2:        GEN- The patient is elderly appearing, alert and oriented x 3 today.   Head- normocephalic, atraumatic Eyes-  Sclera clear, conjunctiva pink Ears- very poor hearing Oropharynx- clear Neck- supple,   Lungs- Clear to ausculation bilaterally, normal work of breathing Heart- Regular rate and rhythm  GI- soft, NT, ND, + BS Extremities- no clubbing, cyanosis, or edema MS- diffuse muscle atrophy Skin- no rash or lesion Psych- euthymic mood, full affect Neuro- strength and sensation are intact  EKG  reviewed, reveals trifascicular block going back several years.  Atrial fibrillation with RVR is also noted Echo 12/15 is reviewed Cath from Dr Angelena Form is reviewed  Labs:   Lab Results  Component Value Date   WBC 4.8 12/12/2013   HGB 13.5 12/12/2013   HCT 41.2 12/12/2013   MCV 82.4 12/12/2013   PLT 173 12/12/2013    Recent Labs Lab 12/07/13 1630  12/12/13 0339  NA 138  < > 143  K 4.5  < > 4.0  CL 102  < > 106  CO2 22  < > 21  BUN 19  < > 18  CREATININE 1.00  < > 1.12  CALCIUM 9.2  < > 9.2  PROT 6.8  --   --   BILITOT 0.3  --   --   ALKPHOS 77  --   --   ALT 18  --   --   AST 21  --   --   GLUCOSE 154*  < > 141*  < > = values in this interval not displayed. No results found for: CKTOTAL, CKMB, CKMBINDEX, TROPONINI No results found for: CHOL No results found for: HDL No results found for: LDLCALC No results found for: TRIG No results found for: CHOLHDL No results found for: LDLDIRECT    ASSESSMENT AND PLAN:   1. Symptomatic second degree AV block The patient has chronic trifascicular block and avoidance of AV nodal agents has been tried.  Recently he has begun having symptomatic second degree AV block with presyncope and dizziness.  He also has AF and atach with RVR which necessitate AV nodal agents and likely AAD therapy long term.   The patient has symptomatic bradycardia without reversible cause.  I would therefore recommend pacemaker implantation at this time.  Risks, benefits, alternatives to pacemaker implantation were discussed in detail with the patient today. The patient understands that the risks include but are not limited to bleeding, infection, pneumothorax, perforation, tamponade, vascular damage, renal failure, MI, stroke, death,  and lead dislodgement and wishes to proceed at this time.  2. Atach/ AF Restart AV nodal agents after pacemaker is in place Will consider amiodarone for rate control of his very fast and symptomatic atrial arrhythmias Given  advanced age and comorbidities, I would be reluctant to consider anticoagulation at this time though his chads2vasc score is at least 8.  This can be further addressed by Dr Johnsie Cancel in the future.  3. Hypertension Very elevated BP Improved  with hydralazine Will continue to follow closely We will have more options for BP control after PPM implant  Thompson Grayer, MD 12/12/2013  11:47 AM

## 2013-12-12 NOTE — Plan of Care (Signed)
Problem: Consults Goal: PCI Patient Education (See Patient Education module for education specifics.)  Outcome: Progressing

## 2013-12-12 NOTE — Progress Notes (Signed)
Dr. Harrington Challenger notified of pt's bp still high after po lopressor 25mg  and iv lopressor 5mg  x 2. BP went from 217/51 to 168/55, but now bp back up to 191/64 and heart rate dropping to upper 50's at times and having occasional pauses up to 1.6 secs.  Dr. Harrington Challenger gave order for po hydralazine tid in order for bp to slowly decrease. First dose to be given now.

## 2013-12-12 NOTE — Progress Notes (Signed)
PT Cancellation Note  Patient Details Name: KULLEN TOMASETTI MRN: [id number] DOB: 09-06-1923   Cancelled Treatment:    Reason Eval/Treat Not Completed: Discussed pt case with RN, who states that pt is to go for permanent pacemaker implantation later this afternoon. Pt has already ambulated with cardiac rehab today, and has been up to the bathroom with staff multiple times this morning. Will hold PT eval until after PPM is placed, and follow up tomorrow as schedule allows.    Rolinda Roan 12/12/2013, 12:24 PM   Rolinda Roan, PT, DPT Acute Rehabilitation Services Pager: [phone]

## 2013-12-12 NOTE — Progress Notes (Signed)
Pt has had several short runs of nsvt, bigeminy, first and second degree heart block, and a period of hr in the upper 40's-50's.  Pt was asymptomatic thruout.  Pt has been up this morning and walked to bathroom and stood at sink washing face and did not have chest pain, dizziness and heart rate was in the 80's-90's with bp 172/40.

## 2013-12-12 NOTE — Care Management Note (Unsigned)
    Page 1 of 1   12/12/2013     11:50:01 AM CARE MANAGEMENT NOTE 12/12/2013  Patient:  Evan [name], Evan [name]   Account Number:  [id number]  Date Initiated:  12/12/2013  Documentation initiated by:  Keveon Amsler  Subjective/Objective Assessment:   Pt s/p successful PTCA on 12/11/13.  PTA, pt resided at [facility].     Action/Plan:   CSW following to assist with dc back to ALF when stable for dc.  Will follow progress.   Anticipated DC Date:  12/13/2013   Anticipated DC Plan:  ASSISTED LIVING / REST HOME  In-house referral  Clinical Social Worker      DC Planning Services  CM consult      Choice offered to / List presented to:             Status of service:  In process, will continue to follow Medicare Important Message given?  YES (If response is "NO", the following Medicare IM given date fields will be blank) Date Medicare IM given:  12/12/2013 Medicare IM given by:  Kaelan Emami Date Additional Medicare IM given:   Additional Medicare IM given by:    Discharge Disposition:    Per UR Regulation:  Reviewed for med. necessity/level of care/duration of stay  If discussed at [place] of Stay Meetings, dates discussed:   12/12/2013    Comments:

## 2013-12-12 NOTE — Plan of Care (Signed)
Problem: Phase III Progression Outcomes Goal: No angina with increased activity Outcome: Completed/Met Date Met:  12/12/13 Goal: Tolerating diet Outcome: Completed/Met Date Met:  12/12/13 Goal: Discharge plan remains appropriate-arrangements made Outcome: Completed/Met Date Met:  12/12/13 Goal: Vital signs stable Outcome: Completed/Met Date Met:  12/12/13 Goal: Vascular site scale level 0 - I Vascular Site Scale Level 0: No bruising/bleeding/hematoma Level I (Mild): Bruising/Ecchymosis, minimal bleeding/ooozing, palpable hematoma < 3 cm Level II (Moderate): Bleeding not affecting hemodynamic parameters, pseudoaneurysm, palpable hematoma > 3 cm Level III (Severe) Bleeding which affects hemodynamic parameters or retroperitoneal hemorrhage  Outcome: Completed/Met Date Met:  12/12/13 Goal: Cardiac Rehab if ordered Outcome: Completed/Met Date Met:  12/12/13

## 2013-12-12 NOTE — Progress Notes (Signed)
BP is now 145/40.

## 2013-12-13 ENCOUNTER — Inpatient Hospital Stay (HOSPITAL_COMMUNITY): Payer: Medicare Other

## 2013-12-13 ENCOUNTER — Encounter (HOSPITAL_COMMUNITY): Payer: Self-pay | Admitting: Cardiology

## 2013-12-13 DIAGNOSIS — I442 Atrioventricular block, complete: Secondary | ICD-10-CM | POA: Diagnosis present

## 2013-12-13 DIAGNOSIS — Z9861 Coronary angioplasty status: Secondary | ICD-10-CM

## 2013-12-13 DIAGNOSIS — I2510 Atherosclerotic heart disease of native coronary artery without angina pectoris: Secondary | ICD-10-CM

## 2013-12-13 DIAGNOSIS — H9190 Unspecified hearing loss, unspecified ear: Secondary | ICD-10-CM

## 2013-12-13 DIAGNOSIS — Z950 Presence of cardiac pacemaker: Secondary | ICD-10-CM | POA: Diagnosis not present

## 2013-12-13 LAB — GLUCOSE, CAPILLARY: Glucose-Capillary: 162 mg/dL — ABNORMAL HIGH (ref 70–99)

## 2013-12-13 MED ORDER — ASPIRIN 81 MG PO CHEW: 81.0000 mg | CHEWABLE_TABLET | Freq: Every day | ORAL | Status: DC

## 2013-12-13 MED ORDER — ISOSORBIDE MONONITRATE ER 60 MG PO TB24: 60.0000 mg | ORAL_TABLET | Freq: Every day | ORAL | Status: DC

## 2013-12-13 MED ORDER — AMLODIPINE BESYLATE 10 MG PO TABS: 10.0000 mg | ORAL_TABLET | Freq: Every day | ORAL | Status: DC

## 2013-12-13 MED ORDER — CLOPIDOGREL BISULFATE 75 MG PO TABS: 75.0000 mg | ORAL_TABLET | Freq: Every day | ORAL | Status: DC

## 2013-12-13 MED ORDER — ACETAMINOPHEN 325 MG PO TABS: 325.0000 mg | ORAL_TABLET | ORAL | Status: DC | PRN

## 2013-12-13 MED ORDER — TRAMADOL HCL 50 MG PO TABS: 50.0000 mg | ORAL_TABLET | Freq: Four times a day (QID) | ORAL | Status: DC | PRN

## 2013-12-13 MED ORDER — AMIODARONE HCL 200 MG PO TABS: 200.0000 mg | ORAL_TABLET | Freq: Two times a day (BID) | ORAL | Status: DC

## 2013-12-13 MED ORDER — HYDRALAZINE HCL 50 MG PO TABS: 50.0000 mg | ORAL_TABLET | Freq: Three times a day (TID) | ORAL | Status: DC

## 2013-12-13 MED ORDER — CARVEDILOL 6.25 MG PO TABS: 6.2500 mg | ORAL_TABLET | Freq: Two times a day (BID) | ORAL | Status: DC

## 2013-12-13 NOTE — Evaluation (Signed)
Physical Therapy Evaluation Patient Details Name: Evan [name] MRN: [id number] DOB: Oct 11, 1923 Today's Date: 12/13/2013   History of Present Illness  Pt is an 80 year old male followed by Dr Johnsie Cancel with past medical history of CAD s/p CABG '96,admitted 12/07/13 with recent presyncopal episodes and bradycardia. He has had baseline trifasicular block. Verapamil and metoprolol initially held. Patient then developed runs of SVT with rapid ventricular response and second degree AVB. With tachycardia he developed chest pain. Cath and subsequent SVG-OM1/OM2 PCI with DES was performed on 12/11/13. On 12/12/13 he had a MDT pacemaker implanted by Dr Joylene Grapes.  Clinical Impression  Pt admitted with the above. Pt currently with functional limitations due to the deficits listed below (see PT Problem List). At the time of PT eval pt was able to perform transfers and ambulation with good guarding of LUE and minimal weight bearing through walker (rested LUE on walker only for balance). Pt reports slight tightness in chest after gait training which pt states resolved almost immediately with seated rest break. He appears somewhat anxious at times and states that he does not want to "over-do" it today.   Pt will benefit from skilled PT to increase their independence and safety with mobility to allow discharge to the venue listed below. Will continue to see pt acutely, and feel that cardiac rehab may be a good option at d/c. Pt reports he tries to stay active and walks for exercise at the ALF.      Follow Up Recommendations Other (comment);No PT follow up (Cardiac Rehab)    Equipment Recommendations  None recommended by PT    Recommendations for Other Services       Precautions / Restrictions Precautions Precautions: Fall;ICD/Pacemaker Required Braces or Orthoses: Sling Restrictions Weight Bearing Restrictions: No      Mobility  Bed Mobility               General bed mobility comments: Pt sitting  up in recliner upon PT arrival.   Transfers Overall transfer level: Needs assistance Equipment used: Rolling walker (2 wheeled) Transfers: Sit to/from Stand Sit to Stand: Min guard         General transfer comment: VC's for no WB through LUE at this time. Pt was able to power-up to full standing position with RUE support and no assist from therapist.   Ambulation/Gait Ambulation/Gait assistance: Min guard Ambulation Distance (Feet): 250 Feet Assistive device: Rolling walker (2 wheeled) Gait Pattern/deviations: Step-through pattern;Decreased stride length;Trunk flexed Gait velocity: Decreased Gait velocity interpretation: Below normal speed for age/gender General Gait Details: Pt is able to rest LUE on walker and use RUE for steering and weight bearing. Pt occasionally required cueing to maintain this, however overall did very well. No unsteadiness or LOB noted.   Stairs            Wheelchair Mobility    Modified Rankin (Stroke Patients Only)       Balance Overall balance assessment: Needs assistance Sitting-balance support: Feet supported;No upper extremity supported Sitting balance-Leahy Scale: Fair     Standing balance support: Bilateral upper extremity supported Standing balance-Leahy Scale: Poor Standing balance comment: Feel pt requires UE support at this time to maintain standing balance.                              Pertinent Vitals/Pain Pain Assessment: 0-10 Pain Score: 3  Pain Location: L arm Pain Descriptors / Indicators: Operative site guarding Pain Intervention(s): Monitored  during session;Repositioned    Home Living Family/patient expects to be discharged to:: Assisted living               Home Equipment: Walker - 2 wheels      Prior Function Level of Independence: Independent with assistive device(s)         Comments: Pt helps to care for his wife who has Alzheimer's Disease. Pt states that he is used to walking ~6 laps  at the ALF.      Hand Dominance   Dominant Hand: Right    Extremity/Trunk Assessment   Upper Extremity Assessment: LUE deficits/detail       LUE Deficits / Details: Acute pain and weakness s/p pacemaker implantation   Lower Extremity Assessment: LLE deficits/detail   LLE Deficits / Details: Pt reports he had a prior back surgery which damaged the nerve to his quads. Pt has residual weakness in quads, and suspected atrophy from guarding in hamstrings and hip flexor. Pt states his LE weakness is the cause of his falls at home.   Cervical / Trunk Assessment: Kyphotic  Communication   Communication: No difficulties  Cognition Arousal/Alertness: Awake/alert Behavior During Therapy: WFL for tasks assessed/performed Overall Cognitive Status: Within Functional Limits for tasks assessed                      General Comments      Exercises        Assessment/Plan    PT Assessment Patient needs continued PT services  PT Diagnosis Difficulty walking;Generalized weakness   PT Problem List Decreased strength;Decreased range of motion;Decreased activity tolerance;Decreased balance;Decreased mobility;Decreased knowledge of use of DME;Decreased safety awareness;Decreased knowledge of precautions;Cardiopulmonary status limiting activity;Pain  PT Treatment Interventions DME instruction;Gait training;Functional mobility training;Stair training;Therapeutic activities;Therapeutic exercise;Neuromuscular re-education;Patient/family education   PT Goals (Current goals can be found in the Care Plan section) Acute Rehab PT Goals Patient Stated Goal: Return to the ALF today PT Goal Formulation: With patient Time For Goal Achievement: 12/20/13 Potential to Achieve Goals: Good    Frequency Min 3X/week   Barriers to discharge        Co-evaluation               End of Session Equipment Utilized During Treatment: Gait belt Activity Tolerance: Patient tolerated treatment  well Patient left: in chair;with call bell/phone within reach Nurse Communication: Mobility status         Time: 4401-0272 PT Time Calculation (min) (ACUTE ONLY): 23 min   Charges:   PT Evaluation $Initial PT Evaluation Tier I: 1 Procedure PT Treatments $Gait Training: 8-22 mins   PT G Codes:          Rolinda Roan 12/13/2013, 9:57 AM   Rolinda Roan, PT, DPT Acute Rehabilitation Services Pager: [phone]

## 2013-12-13 NOTE — Discharge Summary (Signed)
Patient ID: Evan [name],  MRN: [id number], DOB/AGE: Mar 11, 1923 80 y.o.  Admit date: 12/07/2013 Discharge date: 12/13/2013  Primary Care Provider: Horatio Pel, MD Primary Cardiologist: Dr Winifred Olive  Discharge Diagnoses Principal Problem:   Tachy-brady syndrome Active Problems:   Angina pectoris- rate related   Sick sinus syndrome   PAF (paroxysmal atrial fibrillation)   Syncope   Trifascicular block   CABG '96   Aortic stenosis-mild with Nl LVF 2014   CAD S/P SVG-MO1/OM2 DES 12/11/13   Pacemaker implanted 12/12/13 (MDT)   Second degree AV block- symptomatic   HYPERCHOLESTEROLEMIA   Essential hypertension   HOH (hard of hearing)    Procedures: Cath/ PCI 12/11/13                        PTVDP 12/12/13   Hospital Course:   80 year old male followed by Dr Johnsie Cancel with past medical history of CAD s/p CABG '96,admitted 12/07/13 with recent presyncopal episodes and bradycardia. He has had baseline trifasicular block. Verapamil and metoprolol initially held. Patient then developed runs of SVT with rapid ventricular response and second degree AVB. With tachycardia he developed chest pain. Cath and subsequent SVG-OM1/OM2 PCI with DES was performed on 12/11/13. On 12/12/13 he had a MDT pacemaker implanted by Dr Joylene Grapes. The plan is for Amiodarone 200 mg BID till seen in follow up, then decrease to 200 mg daily. He will need a TCM follow up in a week as well as a pacer site check.   Discharge Vitals:  Blood pressure 129/41, pulse 86, temperature 97.7 F (36.5 C), temperature source Oral, resp. rate 20, height 5\' 7"  (1.702 m), weight 162 lb 0.6 oz (73.5 kg), SpO2 97 %.    Labs: Results for orders placed or performed during the hospital encounter of 12/07/13 (from the past 24 hour(s))  Surgical pcr screen     Status: Abnormal   Collection Time: 12/12/13  1:44 PM  Result Value Ref Range   MRSA, PCR POSITIVE (A) NEGATIVE   Staphylococcus aureus POSITIVE (A) NEGATIVE    Glucose, capillary     Status: Abnormal   Collection Time: 12/12/13  5:28 PM  Result Value Ref Range   Glucose-Capillary 153 (H) 70 - 99 mg/dL  Glucose, capillary     Status: Abnormal   Collection Time: 12/12/13  9:18 PM  Result Value Ref Range   Glucose-Capillary 131 (H) 70 - 99 mg/dL   Comment 1 Notify RN    Comment 2 Documented in Chart   Glucose, capillary     Status: Abnormal   Collection Time: 12/13/13  7:22 AM  Result Value Ref Range   Glucose-Capillary 162 (H) 70 - 99 mg/dL   Comment 1 Notify RN    Comment 2 Documented in Chart     Disposition:  Follow-up Information    Follow up with Jenkins Rouge, MD.   Specialty:  Cardiology   Why:  office will call you   Contact information:   [address] [place] [address] [phone]       Discharge Medications:    Medication List    STOP taking these medications        aspirin 325 MG EC tablet  Replaced by:  aspirin 81 MG chewable tablet     metoprolol 50 MG tablet  Commonly known as:  LOPRESSOR     verapamil 180 MG (CO) 24 hr tablet  Commonly known as:  COVERA HS  TAKE these medications        acetaminophen 325 MG tablet  Commonly known as:  TYLENOL  Take 1-2 tablets (325-650 mg total) by mouth every 4 (four) hours as needed for mild pain.     amiodarone 200 MG tablet  Commonly known as:  PACERONE  Take 1 tablet (200 mg total) by mouth 2 (two) times daily.     amLODipine 10 MG tablet  Commonly known as:  NORVASC  Take 1 tablet (10 mg total) by mouth daily.     aspirin 81 MG chewable tablet  Chew 1 tablet (81 mg total) by mouth daily.     carvedilol 6.25 MG tablet  Commonly known as:  COREG  Take 1 tablet (6.25 mg total) by mouth 2 (two) times daily with a meal.     clopidogrel 75 MG tablet  Commonly known as:  PLAVIX  Take 1 tablet (75 mg total) by mouth daily with breakfast.     finasteride 5 MG tablet  Commonly known as:  PROSCAR  Take 5 mg by mouth daily.      furosemide 40 MG tablet  Commonly known as:  LASIX  Take 40 mg by mouth daily.     hydrALAZINE 50 MG tablet  Commonly known as:  APRESOLINE  Take 1 tablet (50 mg total) by mouth 3 (three) times daily.     isosorbide mononitrate 30 MG 24 hr tablet  Commonly known as:  IMDUR  Take 30 mg by mouth daily.     isosorbide mononitrate 60 MG 24 hr tablet  Commonly known as:  IMDUR  Take 1 tablet (60 mg total) by mouth daily.     lansoprazole 15 MG capsule  Commonly known as:  PREVACID  Take 15 mg by mouth daily.     NITROSTAT 0.4 MG SL tablet  Generic drug:  nitroGLYCERIN  PLACE 1 TABLET (0.4 MG TOTAL) UNDER THE TONGUE EVERY 5 (FIVE) MINUTES AS NEEDED FOR CHEST PAIN.     ONGLYZA 5 MG Tabs tablet  Generic drug:  saxagliptin HCl  Take by mouth daily.     traMADol 50 MG tablet  Commonly known as:  ULTRAM  Take 1 tablet (50 mg total) by mouth every 6 (six) hours as needed.     trandolapril 2 MG tablet  Commonly known as:  MAVIK  Take 2 mg by mouth daily.     vitamin C 500 MG tablet  Commonly known as:  ASCORBIC ACID  Take 500 mg by mouth daily.         Duration of Discharge Encounter: Greater than 30 minutes including physician time.  Angelena Form PA-C 12/13/2013 10:55 AM     Thompson Grayer MD

## 2013-12-13 NOTE — Progress Notes (Signed)
Pt just walked with PT. Discussed ex gl at home. Also discussed CRPII but pt sts he feels it would be too much for him. Reviewed NTG and Plavix. Pt sts he was previously taking 325 ASA. [id number] Yves Dill CES, ACSM 10:17 AM 12/13/2013

## 2013-12-13 NOTE — Discharge Instructions (Signed)

## 2013-12-13 NOTE — Clinical Social Work Note (Signed)
Evan [name] medically stable for discharge back to [facility] today. CSW talked with patient and confirmed his plan to return to the facility. CSW talked with nursing director, Dana Allan [phone]) regarding discharge and clinicals requested to be transmitted for review before patient comes. Per Ms. Braulio Conte, they can provide transportation for patient from hospital to facility.  Mani Celestin Givens, MSW, LCSW [phone]

## 2013-12-13 NOTE — Progress Notes (Signed)
    Subjective:  Up in room, no complaints  Objective:  Vital Signs in the last 24 hours: Temp:  [97.7 F (36.5 C)-98.9 F (37.2 C)] 97.7 F (36.5 C) (12/09 0619) Pulse Rate:  [64-86] 86 (12/09 0619) Resp:  [16-20] 20 (12/09 0619) BP: (128-251)/(38-71) 129/41 mmHg (12/09 0619) SpO2:  [95 %-99 %] 97 % (12/09 0619) Weight:  [162 lb 0.6 oz (73.5 kg)] 162 lb 0.6 oz (73.5 kg) (12/09 0100)  Intake/Output from previous day:  Intake/Output Summary (Last 24 hours) at 12/13/13 0858 Last data filed at 12/13/13 0700  Gross per 24 hour  Intake    120 ml  Output    602 ml  Net   -482 ml    Physical Exam: General appearance: alert, cooperative, no distress and HOH Lungs: clear to auscultation bilaterally Heart: regular rate and rhythm Extremities: Lt wrist without hematoma Pacer site without hematoma   Rate: 78  Rhythm: normal sinus rhythm and pacing on demand  Lab Results:  Recent Labs  12/12/13 0339  WBC 4.8  HGB 13.5  PLT 173    Recent Labs  12/11/13 1921 12/12/13 0339  NA 141 143  K 3.6* 4.0  CL 104 106  CO2 19 21  GLUCOSE 155* 141*  BUN 22 18  CREATININE 1.03 1.12   No results for input(s): TROPONINI in the last 72 hours.  Invalid input(s): CK, MB No results for input(s): INR in the last 72 hours.  Imaging: Imaging results have been reviewed  Cardiac Studies:  Assessment/Plan:  80 year old male followed by Dr Johnsie Cancel with past medical history of CAD s/p CABG '96,admitted 12/07/13 with recent presyncopal episodes and bradycardia. He has had baseline trifasicular block. Verapamil and metoprolol initially held. Patient then developed runs of SVT with rapid ventricular response and second degree AVB. With tachycardia he developed chest pain. Cath and subsequent SVG-OM1/OM2 PCI with DES was performed on 12/11/13. On 12/12/13 he had a MDT pacemaker implanted by Dr Joylene Grapes.    Principal Problem:   Tachy-brady syndrome Active Problems:   Angina pectoris- rate  related   Sick sinus syndrome   PAF (paroxysmal atrial fibrillation)   Syncope   Trifascicular block   CABG '96   Aortic stenosis-mild with Nl LVF 2014   CAD S/P SVG-MO1/OM2 DES 12/11/13   Pacemaker implanted 12/12/13 (MDT)   Second degree AV block- symptomatic   HYPERCHOLESTEROLEMIA   Essential hypertension   HOH (hard of hearing)   PLAN: The pt was put on Amiodarone for rate control but not felt to be a candidate for anticoagulation. He now has a pacemaker and we could increase beta blocker as needed-? Continue Amiodarone. F/U pacer clinic and Dr Johnsie Cancel.   Kerin Ransom PA-C Beeper [phone]   12/13/2013, 8:58 AM  I have seen, examined the patient, and reviewed the above assessment and plan.  Changes to above are made where necessary.   Device interrogation is reviewed and normal.  CXR reveals stable leads and no ptx.  Will discharge to home today Will need transition of care appointment with Dr Johnsie Cancel or an extender. Decisions about anticoagulation can be made at that time Continue amiodarone 200mg  BId until transition of care appointment and then decrease to 200mg  daily then Continue to titrate beta blocker as an outpatient.   Routine wound care and follow-up in the device clinic.   Co Sign: Thompson Grayer, MD 12/13/2013 10:45 AM

## 2013-12-14 ENCOUNTER — Encounter (HOSPITAL_COMMUNITY): Payer: Self-pay | Admitting: Cardiovascular Disease

## 2013-12-19 ENCOUNTER — Telehealth: Payer: Self-pay | Admitting: Cardiovascular Disease

## 2013-12-19 NOTE — Telephone Encounter (Signed)
WILL FORWARD TO  DR  Bonita Quin NURSE .[facility]on[facility] Housekeeper

## 2013-12-19 NOTE — Telephone Encounter (Signed)
New Msg     Melissa from [facility] calling, states pt had pacemaker put in recently and wasn't supposed to drive, but left in his car yesterday anyway.   Melissa may be reached at [phone] if needed.

## 2013-12-21 ENCOUNTER — Ambulatory Visit (INDEPENDENT_AMBULATORY_CARE_PROVIDER_SITE_OTHER): Payer: Medicare Other | Admitting: *Deleted

## 2013-12-21 DIAGNOSIS — I459 Conduction disorder, unspecified: Secondary | ICD-10-CM

## 2013-12-21 NOTE — Progress Notes (Signed)

## 2013-12-22 ENCOUNTER — Other Ambulatory Visit: Payer: Self-pay | Admitting: Cardiovascular Disease

## 2013-12-28 NOTE — Progress Notes (Signed)
Patient ID: Evan [name], male   DOB: 07-06-1923, 80 y.o.   MRN: [id number] This is an 80 year old white male patient who has history of coronary artery disease status post CABG in 1996. He has chronic weakness in his left leg for muscle atrophy so can't exercise regularly. This has also caused him to fall 6 times in the past 3 months. He says if he is walking on uneven ground he becomes unsteady and then his leg is too weak to compensate.   Echo 4/24 mild AS and normal EF reviewed  Study Conclusions  - Left ventricle: The cavity size was normal. Wall thickness was increased in a pattern of moderate LVH. There was mild focal basal hypertrophy of the septum. Systolic function was normal. The estimated ejection fraction was in the range of 55% to 60%. - Aortic valve: There was mild stenosis. Trivial regurgitation. Mean gradient: 105mm Hg (S). Peak gradient: 64mm Hg (S). - Mitral valve: Calcified annulus. - Left atrium: The atrium was mildly dilated. - Atrial septum: A patent foramen ovale cannot be excluded.  Since moving to [place] and not having to care for wife daily chest pain is improved Needs refill on imdur. Angina less frequent   ECG with trifasicular block at baseline  Admitted 12/15 with syncope bradycardia and chest pain  Cath by Dr Milton Ferguson with DES SVG OM 12/15  Impression: 1. Severe triple vessel disease s/p 5 vessel CABG with 5/5 patent bypass grafts. 2. Severe stenosis mid body of SVG to OM 3. Unstable angina 4. Successful PTCA/DES x 1 mid body of SVG to OM1/OM2  Then had pacer placed by Dr Rayann Heman 12/15  Medtronic Adapta L dual-chamber pacemaker  Only complaint is leg weakness no chest pain    ROS: Denies fever, malais, weight loss, blurry vision, decreased visual acuity, cough, sputum, SOB, hemoptysis, pleuritic pain, palpitaitons, heartburn, abdominal pain, melena, lower extremity edema, claudication, or rash.  All other systems reviewed and  negative  General: Affect appropriate Healthy:  appears stated age 3: normal Neck supple with no adenopathy JVP normal no bruits no thyromegaly Lungs clear with no wheezing and good diaphragmatic motion Heart:  S1/S2 SEM murmur, no rub, gallop or click pacer under left clavicle  PMI normal Abdomen: benighn, BS positve, no tenderness, no AAA no bruit.  No HSM or HJR Distal pulses intact with no bruits No edema Neuro non-focal Skin warm and dry LLE weakness    Current Outpatient Prescriptions  Medication Sig Dispense Refill  . acetaminophen (TYLENOL) 325 MG tablet Take 1-2 tablets (325-650 mg total) by mouth every 4 (four) hours as needed for mild pain.    Marland Kitchen amiodarone (PACERONE) 200 MG tablet Take 1 tablet (200 mg total) by mouth 2 (two) times daily. 60 tablet 5  . amLODipine (NORVASC) 10 MG tablet Take 1 tablet (10 mg total) by mouth daily. 30 tablet 11  . aspirin 81 MG chewable tablet Chew 1 tablet (81 mg total) by mouth daily.    . carvedilol (COREG) 6.25 MG tablet Take 1 tablet (6.25 mg total) by mouth 2 (two) times daily with a meal. 60 tablet 11  . clopidogrel (PLAVIX) 75 MG tablet Take 1 tablet (75 mg total) by mouth daily with breakfast. 30 tablet 11  . finasteride (PROSCAR) 5 MG tablet Take 5 mg by mouth daily.    . furosemide (LASIX) 40 MG tablet Take 40 mg by mouth daily.    . hydrALAZINE (APRESOLINE) 50 MG tablet Take 1 tablet (50 mg  total) by mouth 3 (three) times daily. 100 tablet 11  . isosorbide mononitrate (IMDUR) 30 MG 24 hr tablet Take 30 mg by mouth daily.    . isosorbide mononitrate (IMDUR) 60 MG 24 hr tablet Take 1 tablet (60 mg total) by mouth daily. 30 tablet 11  . lansoprazole (PREVACID) 15 MG capsule Take 15 mg by mouth daily.      Marland Kitchen NITROSTAT 0.4 MG SL tablet PLACE 1 TABLET (0.4 MG TOTAL) UNDER THE TONGUE EVERY 5 (FIVE) MINUTES AS NEEDED FOR CHEST PAIN. 25 tablet 1  . saxagliptin HCl (ONGLYZA) 5 MG TABS tablet Take by mouth daily.      . traMADol  (ULTRAM) 50 MG tablet Take 1 tablet (50 mg total) by mouth every 6 (six) hours as needed. 10 tablet 0  . trandolapril (MAVIK) 2 MG tablet Take 2 mg by mouth daily.      . vitamin C (ASCORBIC ACID) 500 MG tablet Take 500 mg by mouth daily.       No current facility-administered medications for this visit.    Allergies  Demerol and Meperidine hcl  Electrocardiogram:  p synch pacing rate 77 12/15  Assessment and Plan

## 2014-01-01 ENCOUNTER — Encounter: Payer: Self-pay | Admitting: Cardiovascular Disease

## 2014-01-01 ENCOUNTER — Encounter: Payer: Self-pay | Admitting: Internal Medicine

## 2014-01-01 ENCOUNTER — Ambulatory Visit (INDEPENDENT_AMBULATORY_CARE_PROVIDER_SITE_OTHER): Payer: Medicare Other | Admitting: Cardiovascular Disease

## 2014-01-01 VITALS — BP 118/46 | HR 68 | Resp 12 | Ht 67.0 in | Wt 176.0 lb

## 2014-01-01 DIAGNOSIS — I480 Paroxysmal atrial fibrillation: Secondary | ICD-10-CM

## 2014-01-01 DIAGNOSIS — I495 Sick sinus syndrome: Secondary | ICD-10-CM

## 2014-01-01 DIAGNOSIS — I2510 Atherosclerotic heart disease of native coronary artery without angina pectoris: Secondary | ICD-10-CM

## 2014-01-01 DIAGNOSIS — I350 Nonrheumatic aortic (valve) stenosis: Secondary | ICD-10-CM

## 2014-01-01 NOTE — Assessment & Plan Note (Signed)
No change in murmur mild no significant gradient across valve during cath and recent PCI procedure

## 2014-01-01 NOTE — Assessment & Plan Note (Signed)
Maint NSR with a pacing continue low dose amiodarone

## 2014-01-01 NOTE — Assessment & Plan Note (Signed)
Stable with no angina and good activity level.  Continue medical Rx Continue asa/plavix with recent stent to SVG OM

## 2014-01-01 NOTE — Assessment & Plan Note (Signed)
Normal pacer function reviewed recent PACEART  F/u Dr Rayann Heman pocket looks fine

## 2014-01-01 NOTE — Patient Instructions (Signed)
Your physician wants you to follow-up in: 6 months with Dr. Nishan. You will receive a reminder letter in the mail two months in advance. If you don't receive a letter, please call our office to schedule the follow-up appointment.  Your physician recommends that you continue on your current medications as directed. Please refer to the Current Medication list given to you today.  

## 2014-01-07 ENCOUNTER — Encounter (HOSPITAL_COMMUNITY): Payer: Self-pay | Admitting: *Deleted

## 2014-01-16 ENCOUNTER — Encounter: Payer: Self-pay | Admitting: Nurse Practitioner

## 2014-01-16 ENCOUNTER — Encounter (HOSPITAL_COMMUNITY): Payer: Self-pay | Admitting: General Practice

## 2014-01-16 ENCOUNTER — Inpatient Hospital Stay (HOSPITAL_COMMUNITY)
Admission: AD | Admit: 2014-01-16 | Discharge: 2014-01-20 | DRG: 293 | Disposition: A | Discharge status: Skilled Nursing Facility | Payer: Medicare Other | Source: Ambulatory Visit | Attending: Cardiovascular Disease | Admitting: Cardiovascular Disease

## 2014-01-16 ENCOUNTER — Ambulatory Visit (INDEPENDENT_AMBULATORY_CARE_PROVIDER_SITE_OTHER): Payer: Medicare Other | Admitting: Nurse Practitioner

## 2014-01-16 ENCOUNTER — Ambulatory Visit (INDEPENDENT_AMBULATORY_CARE_PROVIDER_SITE_OTHER): Payer: Medicare Other | Admitting: *Deleted

## 2014-01-16 ENCOUNTER — Other Ambulatory Visit: Payer: Self-pay | Admitting: Nurse Practitioner

## 2014-01-16 VITALS — BP 110/40 | HR 56 | Ht 67.0 in | Wt 174.8 lb

## 2014-01-16 DIAGNOSIS — I350 Nonrheumatic aortic (valve) stenosis: Secondary | ICD-10-CM | POA: Diagnosis present

## 2014-01-16 DIAGNOSIS — I5043 Acute on chronic combined systolic (congestive) and diastolic (congestive) heart failure: Principal | ICD-10-CM | POA: Diagnosis present

## 2014-01-16 DIAGNOSIS — N183 Chronic kidney disease, stage 3 unspecified: Secondary | ICD-10-CM | POA: Diagnosis present

## 2014-01-16 DIAGNOSIS — Z951 Presence of aortocoronary bypass graft: Secondary | ICD-10-CM

## 2014-01-16 DIAGNOSIS — Z9181 History of falling: Secondary | ICD-10-CM

## 2014-01-16 DIAGNOSIS — Z8673 Personal history of transient ischemic attack (TIA), and cerebral infarction without residual deficits: Secondary | ICD-10-CM

## 2014-01-16 DIAGNOSIS — R0600 Dyspnea, unspecified: Secondary | ICD-10-CM

## 2014-01-16 DIAGNOSIS — I10 Essential (primary) hypertension: Secondary | ICD-10-CM | POA: Diagnosis present

## 2014-01-16 DIAGNOSIS — I495 Sick sinus syndrome: Secondary | ICD-10-CM | POA: Diagnosis present

## 2014-01-16 DIAGNOSIS — E1121 Type 2 diabetes mellitus with diabetic nephropathy: Secondary | ICD-10-CM | POA: Diagnosis present

## 2014-01-16 DIAGNOSIS — Z950 Presence of cardiac pacemaker: Secondary | ICD-10-CM | POA: Diagnosis not present

## 2014-01-16 DIAGNOSIS — E1122 Type 2 diabetes mellitus with diabetic chronic kidney disease: Secondary | ICD-10-CM | POA: Diagnosis present

## 2014-01-16 DIAGNOSIS — I480 Paroxysmal atrial fibrillation: Secondary | ICD-10-CM

## 2014-01-16 DIAGNOSIS — H9190 Unspecified hearing loss, unspecified ear: Secondary | ICD-10-CM

## 2014-01-16 DIAGNOSIS — E7800 Pure hypercholesterolemia, unspecified: Secondary | ICD-10-CM | POA: Diagnosis present

## 2014-01-16 DIAGNOSIS — I2510 Atherosclerotic heart disease of native coronary artery without angina pectoris: Secondary | ICD-10-CM | POA: Diagnosis present

## 2014-01-16 DIAGNOSIS — Z7982 Long term (current) use of aspirin: Secondary | ICD-10-CM

## 2014-01-16 DIAGNOSIS — E785 Hyperlipidemia, unspecified: Secondary | ICD-10-CM | POA: Diagnosis present

## 2014-01-16 DIAGNOSIS — Z9861 Coronary angioplasty status: Secondary | ICD-10-CM

## 2014-01-16 DIAGNOSIS — K219 Gastro-esophageal reflux disease without esophagitis: Secondary | ICD-10-CM | POA: Diagnosis present

## 2014-01-16 DIAGNOSIS — Z955 Presence of coronary angioplasty implant and graft: Secondary | ICD-10-CM

## 2014-01-16 DIAGNOSIS — Z87891 Personal history of nicotine dependence: Secondary | ICD-10-CM

## 2014-01-16 DIAGNOSIS — I129 Hypertensive chronic kidney disease with stage 1 through stage 4 chronic kidney disease, or unspecified chronic kidney disease: Secondary | ICD-10-CM | POA: Diagnosis present

## 2014-01-16 HISTORY — DX: Unspecified osteoarthritis, unspecified site: M19.90

## 2014-01-16 HISTORY — DX: Heart failure, unspecified: I50.9

## 2014-01-16 LAB — CBC WITH DIFFERENTIAL/PLATELET
Basophils Absolute: 0 10*3/uL (ref 0.0–0.1)
Basophils Relative: 0 % (ref 0–1)
Eosinophils Absolute: 0.1 10*3/uL (ref 0.0–0.7)
Eosinophils Relative: 1 % (ref 0–5)
HCT: 34 % — ABNORMAL LOW (ref 39.0–52.0)
Hemoglobin: 10.9 g/dL — ABNORMAL LOW (ref 13.0–17.0)
Lymphocytes Relative: 9 % — ABNORMAL LOW (ref 12–46)
Lymphs Abs: 0.7 10*3/uL (ref 0.7–4.0)
MCH: 27.8 pg (ref 26.0–34.0)
MCHC: 32.1 g/dL (ref 30.0–36.0)
MCV: 86.7 fL (ref 78.0–100.0)
Monocytes Absolute: 0.4 10*3/uL (ref 0.1–1.0)
Monocytes Relative: 5 % (ref 3–12)
Neutro Abs: 6 10*3/uL (ref 1.7–7.7)
Neutrophils Relative %: 85 % — ABNORMAL HIGH (ref 43–77)
Platelets: 284 10*3/uL (ref 150–400)
RBC: 3.92 MIL/uL — ABNORMAL LOW (ref 4.22–5.81)
RDW: 16.2 % — ABNORMAL HIGH (ref 11.5–15.5)
WBC: 7.2 10*3/uL (ref 4.0–10.5)

## 2014-01-16 LAB — COMPREHENSIVE METABOLIC PANEL
ALT: 19 U/L (ref 0–53)
AST: 20 U/L (ref 0–37)
Albumin: 3.6 g/dL (ref 3.5–5.2)
Alkaline Phosphatase: 75 U/L (ref 39–117)
Anion gap: 11 (ref 5–15)
BUN: 19 mg/dL (ref 6–23)
CO2: 23 mmol/L (ref 19–32)
Calcium: 9.2 mg/dL (ref 8.4–10.5)
Chloride: 109 mEq/L (ref 96–112)
Creatinine, Ser: 1.4 mg/dL — ABNORMAL HIGH (ref 0.50–1.35)
GFR calc Af Amer: 50 mL/min — ABNORMAL LOW (ref >90)
GFR calc non Af Amer: 43 mL/min — ABNORMAL LOW (ref >90)
Glucose, Bld: 181 mg/dL — ABNORMAL HIGH (ref 70–99)
Potassium: 4 mmol/L (ref 3.5–5.1)
Sodium: 143 mmol/L (ref 135–145)
Total Bilirubin: 1.1 mg/dL (ref 0.3–1.2)
Total Protein: 6 g/dL (ref 6.0–8.3)

## 2014-01-16 LAB — CLOSTRIDIUM DIFFICILE BY PCR: Toxigenic C. Difficile by PCR: NEGATIVE

## 2014-01-16 LAB — MDC_IDC_ENUM_SESS_TYPE_INCLINIC
Battery Impedance: 100 Ohm
Battery Remaining Longevity: 137 mo
Battery Voltage: 2.79 V
Brady Statistic AS VP Percent: 3 %
Date Time Interrogation Session: 20160112100000
Lead Channel Impedance Value: 509 Ohm
Lead Channel Impedance Value: 583 Ohm
Lead Channel Setting Pacing Amplitude: 3.5 V
MDC IDC SET LEADCHNL RA PACING AMPLITUDE: 3.5 V
MDC IDC SET LEADCHNL RV PACING PULSEWIDTH: 0.4 ms
MDC IDC SET LEADCHNL RV SENSING SENSITIVITY: 4 mV
MDC IDC STAT BRADY AP VP PERCENT: 13 %
MDC IDC STAT BRADY AP VS PERCENT: 70 %
MDC IDC STAT BRADY AS VS PERCENT: 14 %

## 2014-01-16 LAB — APTT: aPTT: 28 seconds (ref 24–37)

## 2014-01-16 LAB — TSH: TSH: 7.254 u[IU]/mL — ABNORMAL HIGH (ref 0.350–4.500)

## 2014-01-16 LAB — MAGNESIUM: Magnesium: 2.1 mg/dL (ref 1.5–2.5)

## 2014-01-16 LAB — PROTIME-INR
INR: 1.28 (ref 0.00–1.49)
Prothrombin Time: 16.1 seconds — ABNORMAL HIGH (ref 11.6–15.2)

## 2014-01-16 LAB — TROPONIN I
Troponin I: 0.07 ng/mL — ABNORMAL HIGH (ref <0.031)
Troponin I: 0.16 ng/mL — ABNORMAL HIGH (ref <0.031)

## 2014-01-16 LAB — GLUCOSE, CAPILLARY: Glucose-Capillary: 175 mg/dL — ABNORMAL HIGH (ref 70–99)

## 2014-01-16 LAB — MRSA PCR SCREENING: MRSA by PCR: NEGATIVE

## 2014-01-16 LAB — BRAIN NATRIURETIC PEPTIDE: B Natriuretic Peptide: 1411.3 pg/mL — ABNORMAL HIGH (ref 0.0–100.0)

## 2014-01-16 MED ORDER — CARVEDILOL 6.25 MG PO TABS
6.2500 mg | ORAL_TABLET | Freq: Two times a day (BID) | ORAL | Status: DC
  Administered 2014-01-16 – 2014-01-17 (×2): 6.25 mg via ORAL
  Filled 2014-01-16 (×4): qty 1

## 2014-01-16 MED ORDER — PANTOPRAZOLE SODIUM 40 MG PO TBEC
40.0000 mg | DELAYED_RELEASE_TABLET | Freq: Every day | ORAL | Status: DC
  Administered 2014-01-16 – 2014-01-20 (×5): 40 mg via ORAL
  Filled 2014-01-16 (×5): qty 1

## 2014-01-16 MED ORDER — CLOPIDOGREL BISULFATE 75 MG PO TABS
75.0000 mg | ORAL_TABLET | Freq: Every day | ORAL | Status: DC
  Administered 2014-01-16 – 2014-01-20 (×5): 75 mg via ORAL
  Filled 2014-01-16 (×5): qty 1

## 2014-01-16 MED ORDER — SODIUM CHLORIDE 0.9 % IJ SOLN
3.0000 mL | Freq: Two times a day (BID) | INTRAMUSCULAR | Status: DC
  Administered 2014-01-16 – 2014-01-20 (×9): 3 mL via INTRAVENOUS

## 2014-01-16 MED ORDER — AMIODARONE HCL 200 MG PO TABS
200.0000 mg | ORAL_TABLET | Freq: Every day | ORAL | Status: DC
  Administered 2014-01-16 – 2014-01-20 (×5): 200 mg via ORAL
  Filled 2014-01-16 (×5): qty 1

## 2014-01-16 MED ORDER — LOPERAMIDE HCL 2 MG PO CAPS
2.0000 mg | ORAL_CAPSULE | Freq: Once | ORAL | Status: AC
  Administered 2014-01-16: 2 mg via ORAL
  Filled 2014-01-16: qty 1

## 2014-01-16 MED ORDER — TRAMADOL HCL 50 MG PO TABS
50.0000 mg | ORAL_TABLET | Freq: Four times a day (QID) | ORAL | Status: DC | PRN
Start: 2014-01-16 — End: 2014-01-20
  Filled 2014-01-16: qty 1

## 2014-01-16 MED ORDER — LINAGLIPTIN 5 MG PO TABS
5.0000 mg | ORAL_TABLET | Freq: Every day | ORAL | Status: DC
  Administered 2014-01-16 – 2014-01-20 (×5): 5 mg via ORAL
  Filled 2014-01-16 (×6): qty 1

## 2014-01-16 MED ORDER — SODIUM CHLORIDE 0.9 % IV SOLN: 250.0000 mL | INTRAVENOUS | Status: DC | PRN

## 2014-01-16 MED ORDER — ONDANSETRON HCL 4 MG/2ML IJ SOLN: 4.0000 mg | Freq: Four times a day (QID) | INTRAMUSCULAR | Status: DC | PRN

## 2014-01-16 MED ORDER — AMLODIPINE BESYLATE 5 MG PO TABS
5.0000 mg | ORAL_TABLET | Freq: Every day | ORAL | Status: DC
  Administered 2014-01-16 – 2014-01-20 (×5): 5 mg via ORAL
  Filled 2014-01-16 (×5): qty 1

## 2014-01-16 MED ORDER — HEPARIN SODIUM (PORCINE) 5000 UNIT/ML IJ SOLN
5000.0000 [IU] | Freq: Three times a day (TID) | INTRAMUSCULAR | Status: DC
Start: 2014-01-16 — End: 2014-01-20
  Administered 2014-01-16 – 2014-01-20 (×12): 5000 [IU] via SUBCUTANEOUS
  Filled 2014-01-16 (×14): qty 1

## 2014-01-16 MED ORDER — ATORVASTATIN CALCIUM 10 MG PO TABS
10.0000 mg | ORAL_TABLET | Freq: Every day | ORAL | Status: DC
  Administered 2014-01-16 – 2014-01-19 (×4): 10 mg via ORAL
  Filled 2014-01-16 (×6): qty 1

## 2014-01-16 MED ORDER — SODIUM CHLORIDE 0.9 % IJ SOLN: 3.0000 mL | INTRAMUSCULAR | Status: DC | PRN

## 2014-01-16 MED ORDER — ACETAMINOPHEN 325 MG PO TABS: 650.0000 mg | ORAL_TABLET | ORAL | Status: DC | PRN

## 2014-01-16 MED ORDER — FUROSEMIDE 10 MG/ML IJ SOLN
40.0000 mg | Freq: Two times a day (BID) | INTRAMUSCULAR | Status: DC
  Administered 2014-01-16 – 2014-01-17 (×2): 40 mg via INTRAVENOUS
  Filled 2014-01-16 (×3): qty 4

## 2014-01-16 MED ORDER — HYDRALAZINE HCL 50 MG PO TABS
50.0000 mg | ORAL_TABLET | Freq: Three times a day (TID) | ORAL | Status: DC
  Administered 2014-01-16 – 2014-01-18 (×6): 50 mg via ORAL
  Filled 2014-01-16 (×9): qty 1

## 2014-01-16 MED ORDER — FINASTERIDE 5 MG PO TABS
5.0000 mg | ORAL_TABLET | Freq: Every day | ORAL | Status: DC
  Administered 2014-01-16 – 2014-01-20 (×5): 5 mg via ORAL
  Filled 2014-01-16 (×5): qty 1

## 2014-01-16 MED ORDER — ASPIRIN EC 81 MG PO TBEC
81.0000 mg | DELAYED_RELEASE_TABLET | Freq: Every day | ORAL | Status: DC
  Administered 2014-01-16 – 2014-01-20 (×5): 81 mg via ORAL
  Filled 2014-01-16 (×5): qty 1

## 2014-01-16 MED ORDER — ISOSORBIDE MONONITRATE ER 30 MG PO TB24
30.0000 mg | ORAL_TABLET | Freq: Every day | ORAL | Status: DC
  Administered 2014-01-16 – 2014-01-20 (×5): 30 mg via ORAL
  Filled 2014-01-16 (×5): qty 1

## 2014-01-16 NOTE — Progress Notes (Signed)
UR completed Meaghann Choo K. Aleysia Oltmann, RN, BSN, MSHL, CCM  01/16/2014 4:44 PM

## 2014-01-16 NOTE — Progress Notes (Addendum)
[place] Date of Birth: Jul 01, 1923 Medical Record #[id number]  History of Present Illness: Mr. Evan [name] is seen back today for a work in visit. Seen for [name] and Dr. Rayann Heman. He is an 81 year old male with known CAD - prior CABG back in 1996. Admitted in December of 2015 with syncope/bradycardia and chest pain - was cathed and had DES to the SVG to the OM - had 5/5 patent bypass grafts. On DAPT for one year with Plavix and aspirin. Cath was followed by PPM implant per Dr. Rayann Heman - dual chamber device. Has chronic weakness in the legs/atrophy and has had frequent falls. He is on chronic amiodarone for PAF. Other issues include aortic stenosis, DM, HLD, and HTN. Has some mild LV dysfunction per recent echo with grade 1 diastolic dysfunction.   He was last seen here right after Christmas to see [name] and seemed to be doing ok - continuing to have chronic weakness of his legs.   Appointment made yesterday - concern for ?low HR - thus added to the flex schedule.   Comes in today. Here alone.  He is in a wheelchair. Has a multitude of complaints. He has more edema in his legs and belly. Very bloated. No chest pain. Short of breath - not very active. Has equilibrium issues - no recent falls. Still with leg weakness and pain. Resides in assisted living.   Current Outpatient Prescriptions  Medication Sig Dispense Refill  . acetaminophen (TYLENOL) 325 MG tablet Take 650 mg by mouth every 6 (six) hours as needed.    Marland Kitchen amiodarone (PACERONE) 200 MG tablet Take 1 tablet (200 mg total) by mouth 2 (two) times daily. 60 tablet 5  . amLODipine (NORVASC) 10 MG tablet Take 1 tablet (10 mg total) by mouth daily. (Patient taking differently: Take 5 mg by mouth daily. ) 30 tablet 11  . aspirin 81 MG chewable tablet Chew 1 tablet (81 mg total) by mouth daily.    Marland Kitchen atorvastatin (LIPITOR) 10 MG tablet Take 1 tablet by mouth daily.  0  . bismuth subsalicylate (PEPTO BISMOL) 262 MG/15ML suspension Take 30  mLs by mouth as directed.    . carvedilol (COREG) 6.25 MG tablet Take 1 tablet (6.25 mg total) by mouth 2 (two) times daily with a meal. 60 tablet 11  . clopidogrel (PLAVIX) 75 MG tablet Take 1 tablet (75 mg total) by mouth daily with breakfast. 30 tablet 11  . cyanocobalamin 100 MCG tablet Take 100 mcg by mouth daily.    . finasteride (PROSCAR) 5 MG tablet Take 5 mg by mouth daily.    . furosemide (LASIX) 40 MG tablet Take 60 mg by mouth daily.     . hydrALAZINE (APRESOLINE) 50 MG tablet Take 1 tablet (50 mg total) by mouth 3 (three) times daily. 100 tablet 11  . isosorbide mononitrate (IMDUR) 30 MG 24 hr tablet Take 90 mg by mouth daily.     . lansoprazole (PREVACID) 15 MG capsule Take 15 mg by mouth daily.      Marland Kitchen NITROSTAT 0.4 MG SL tablet PLACE 1 TABLET (0.4 MG TOTAL) UNDER THE TONGUE EVERY 5 (FIVE) MINUTES AS NEEDED FOR CHEST PAIN. 25 tablet 1  . saxagliptin HCl (ONGLYZA) 5 MG TABS tablet Take by mouth daily.      . traMADol (ULTRAM) 50 MG tablet Take 1 tablet (50 mg total) by mouth every 6 (six) hours as needed. 10 tablet 0  . trandolapril (MAVIK) 2 MG tablet  Take 2 mg by mouth daily.    . vitamin C (ASCORBIC ACID) 500 MG tablet Take 500 mg by mouth daily.      . zaleplon (SONATA) 10 MG capsule Take 10 mg by mouth at bedtime as needed for sleep.     No current facility-administered medications for this visit.    Allergies  Allergen Reactions  . Demerol   . Meperidine Hcl     REACTION: Hypotension    Past Medical History  Diagnosis Date  . HYPERTENSION   . HYPERCHOLESTEROLEMIA   . CAD   . TRANSIENT ISCHEMIC ATTACKS, HX OF   . NEPHROLITHIASIS, HX OF   . GERD   . DM   . Trifascicular block     a. s/p MDT ADDRL1 pacemaker Dr Rayann Heman  . Syncope   . ALLERGIC RHINITIS   . Paroxysmal atrial fibrillation   . Atrial tachycardia     Past Surgical History  Procedure Laterality Date  . Ptca  1980's  . Coronary artery bypass graft  1996  . Back surgery  2000'S  . Av fistula  repair    . 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 Coronary stent placement  12/11/13    SVG-OM DES  . Left heart catheterization with coronary/graft angiogram N/A 12/11/2013    Procedure: LEFT HEART CATHETERIZATION WITH Beatrix Fetters;  Surgeon: Burnell Blanks, MD;  Location: [facility] C[facility] LAB;  Service: Cardiovascular;  Laterality: N/A;  . Cardiac catheterization  12/11/2013    Procedure: CORONARY STENT INTERVENTION;  Surgeon: Burnell Blanks, MD;  Location: [facility] CATH LAB;  Service: Cardiovascular;;  DES 3.5 x 15 Resolute to seq SVG of OM1/OM2   . Permanent pacemaker insertion N/A 12/12/2013    MDT ADDRL1 pacemaker implnated by Dr Rayann Heman    History  Smoking status  . Former Smoker  . Quit date: 01/06/1956  Smokeless tobacco  . Not on file    History  Alcohol Use No    Family History  Problem Relation Age of Onset  . Diabetes Mother   . Coronary artery disease Father     Review of Systems: The review of systems is per the HPI.  All other systems were reviewed and are negative.  Physical Exam: BP 110/40 mmHg  Pulse 56  Ht 5\' 7"  (1.702 m)  Wt 174 lb 12.8 oz (79.289 kg)  BMI 27.37 kg/m2  SpO2 94% Patient is very pleasant and in no acute distress. Weight is up. He is hard of hearing. He is in a wheelchair. Skin is warm and dry. Color is sallow/pale.   HEENT is unremarkable but has poor dentition. Normocephalic/atraumatic. PERRL. Sclera are nonicteric. Neck is supple. No masses. No JVD. Lungs are clear. Cardiac exam shows a regular rate and rhythm. Harsh outflow murmur.  Abdomen is distended with decreased bowel sounds. Noted to have sacral edema.  Extremities are with 2 to 3+ edema. Gait is not tested. No gross neurologic deficits noted.  Wt Readings from Last 3 Encounters:  01/16/14 174 lb 12.8 oz (79.289 kg)  01/01/14 176 lb (79.833 kg)  12/13/13 162 lb 0.6 oz (73.5 kg)    LABORATORY DATA/PROCEDURES: EKG shows A pacing, V  sensing - rate 56. Diffuse T wave changes.   Lab Results  Component Value Date   WBC 4.8 12/12/2013   HGB 13.5 12/12/2013   HCT 41.2 12/12/2013   PLT 173 12/12/2013   GLUCOSE 141* 12/12/2013   ALT 18 12/07/2013  AST 21 12/07/2013   NA 143 12/12/2013   K 4.0 12/12/2013   CL 106 12/12/2013   CREATININE 1.12 12/12/2013   BUN 18 12/12/2013   CO2 21 12/12/2013   INR 1.02 12/07/2013    BNP (last 3 results) No results for input(s): PROBNP in the last 8760 hours.  Echo Study Conclusions from 12/2013  - Left ventricle: The cavity size was normal. Wall thickness was normal. Systolic function was mildly reduced. The estimated ejection fraction was in the range of 45% to 50%. Diffuse hypokinesis. Doppler parameters are consistent with abnormal left ventricular relaxation (grade 1 diastolic dysfunction). - Aortic valve: There was mild regurgitation. - Mitral valve: Calcified annulus. - Left atrium: The atrium was mildly dilated.   Angiographic Findings from 12/2013:  Left main: 30% distal stenosis.   Left Anterior Descending Artery: 100% proximal occlusion. Mid and distal LAD fills from the patent vein graft. The diagonal fills from the patent vein graft.   Circumflex Artery: 100% proximal occlusion. The first and second OM branches fill from the patent vein graft.   Right Coronary Artery: Large dominant vessel with diffuse 99% stenosis in the proximal, mid and distal vessel. The PDA and PLA fill from the patent vein graft.   Graft Anatomy:  SVG to PDA is patent SVG to OM1/OM2 is patent with 95% stenosis mid body of SVG SVG to LAD is patent SVG to Diagonal is patent  Left Ventricular Angiogram: Deferred.   Impression: 1. Severe triple vessel disease s/p 5 vessel CABG with 5/5 patent bypass grafts. 2. Severe stenosis mid body of SVG to OM 3. Unstable angina 4. Successful PTCA/DES x 1 mid body of SVG to OM1/OM2  Recommendations: Continue ASA and Plavix for one  year.        Assessment / Plan: 1. Acute on chronic combined systolic and diastolic HF - he is quite volume overloaded. Little short of breath with just talking - will need admission for diuresis. Do not feel it would be safe to discharge back to assisted living at this time with oral diuretics with risk of falling. Discussed with Dr. Angelena Form who is in agreement with this plan. Daughter "[name]" called to update and she is agreeable to this plan.   2. Recent PCI/DES to SVG to OM1/OM2 -  No active chest pain - continue DAPT  3. Recent PPM placement -  Site looks ok - device is checked - his rate is set at 50 - it is not clear to me as to why - I have had his rate changed to 60.   4. Advanced age  2. AS - with recent echo noted.   6. Leg pain - may need to consider lower extremity doppler study  To transfer by EMS. Patient informed me that he wishes to remain a DNR.  Patient is agreeable to this plan and will call if any problems develop in the interim.   Burtis Junes, RN, [place] [address] [place],   [address] [phone]

## 2014-01-16 NOTE — H&P (Signed)
Evan [name]  01/16/2014 9:00 AM  Office Visit  MRN:  [id number]   Description: Male DOB: Jun 25, 1923  Provider: Burtis Junes, NP  Department: [facility]       Vital Signs  Most recent update: 01/16/2014 9:27 AM by Tamsen Snider    BP Pulse Ht Wt BMI SpO2    110/40 mmHg 56 5\' 7"  (1.702 m) 174 lb 12.8 oz (79.289 kg) 27.37 kg/m2 94%    Vitals History     Progress Notes      Burtis Junes, NP at 01/16/2014 9:06 AM     Status: Signed       Expand All Collapse All     [place] Date of Birth: September 23, 1923 Medical Record #[id number]  History of Present Illness: Evan [name] is seen back today for a work in visit. Seen for [name] and Dr. Rayann Heman. Evan [name] is an 81 year old male with known CAD - prior CABG back in 1996. Admitted in December of 2015 with syncope/bradycardia and chest pain - was cathed and had DES to the SVG to the OM - had 5/5 patent bypass grafts. On DAPT for one year with Plavix and aspirin. Cath was followed by PPM implant per Dr. Rayann Heman - dual chamber device. Has chronic weakness in the legs/atrophy and has had frequent falls. Evan [name] is on chronic amiodarone for PAF. Other issues include aortic stenosis, DM, HLD, and HTN. Has some mild LV dysfunction per recent echo with grade 1 diastolic dysfunction.   Evan [name] was last seen here right after Christmas to see [name] and seemed to be doing ok - continuing to have chronic weakness of his legs.   Appointment made yesterday - concern for ?low HR - thus added to the flex schedule.   Comes in today. Here alone. Evan [name] is in a wheelchair. Has a multitude of complaints. Evan [name] has more edema in his legs and belly. Very bloated. No chest pain. Short of breath - not very active. Has equilibrium issues - no recent falls. Still with leg weakness and pain. Resides in assisted living.   Current Outpatient Prescriptions  Medication Sig Dispense Refill  . acetaminophen (TYLENOL) 325 MG tablet Take 650 mg  by mouth every 6 (six) hours as needed.    Marland Kitchen amiodarone (PACERONE) 200 MG tablet Take 1 tablet (200 mg total) by mouth 2 (two) times daily. 60 tablet 5  . amLODipine (NORVASC) 10 MG tablet Take 1 tablet (10 mg total) by mouth daily. (Patient taking differently: Take 5 mg by mouth daily. ) 30 tablet 11  . aspirin 81 MG chewable tablet Chew 1 tablet (81 mg total) by mouth daily.    Marland Kitchen atorvastatin (LIPITOR) 10 MG tablet Take 1 tablet by mouth daily.  0  . bismuth subsalicylate (PEPTO BISMOL) 262 MG/15ML suspension Take 30 mLs by mouth as directed.    . carvedilol (COREG) 6.25 MG tablet Take 1 tablet (6.25 mg total) by mouth 2 (two) times daily with a meal. 60 tablet 11  . clopidogrel (PLAVIX) 75 MG tablet Take 1 tablet (75 mg total) by mouth daily with breakfast. 30 tablet 11  . cyanocobalamin 100 MCG tablet Take 100 mcg by mouth daily.    . finasteride (PROSCAR) 5 MG tablet Take 5 mg by mouth daily.    . furosemide (LASIX) 40 MG tablet Take 60 mg by mouth daily.     . hydrALAZINE (APRESOLINE) 50 MG tablet Take 1 tablet (50 mg total) by  mouth 3 (three) times daily. 100 tablet 11  . isosorbide mononitrate (IMDUR) 30 MG 24 hr tablet Take 90 mg by mouth daily.     . lansoprazole (PREVACID) 15 MG capsule Take 15 mg by mouth daily.     Marland Kitchen NITROSTAT 0.4 MG SL tablet PLACE 1 TABLET (0.4 MG TOTAL) UNDER THE TONGUE EVERY 5 (FIVE) MINUTES AS NEEDED FOR CHEST PAIN. 25 tablet 1  . saxagliptin HCl (ONGLYZA) 5 MG TABS tablet Take by mouth daily.     . traMADol (ULTRAM) 50 MG tablet Take 1 tablet (50 mg total) by mouth every 6 (six) hours as needed. 10 tablet 0  . trandolapril (MAVIK) 2 MG tablet Take 2 mg by mouth daily.    . vitamin C (ASCORBIC ACID) 500 MG tablet Take 500 mg by mouth daily.     . zaleplon (SONATA) 10 MG capsule Take 10 mg by mouth at bedtime as needed for sleep.     No current  facility-administered medications for this visit.    Allergies  Allergen Reactions  . Demerol   . Meperidine Hcl     REACTION: Hypotension    Past Medical History  Diagnosis Date  . HYPERTENSION   . HYPERCHOLESTEROLEMIA   . CAD   . TRANSIENT ISCHEMIC ATTACKS, HX OF   . NEPHROLITHIASIS, HX OF   . GERD   . DM   . Trifascicular block     a. s/p MDT ADDRL1 pacemaker Dr Rayann Heman  . Syncope   . ALLERGIC RHINITIS   . Paroxysmal atrial fibrillation   . Atrial tachycardia     Past Surgical History  Procedure Laterality Date  . Ptca  1980's  . Coronary artery bypass graft  1996  . Back surgery  2000'S  . Av fistula repair    . 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 Coronary stent placement  12/11/13    SVG-OM DES  . Left heart catheterization with coronary/graft angiogram N/A 12/11/2013    Procedure: LEFT HEART CATHETERIZATION WITH Beatrix Fetters; Surgeon: Burnell Blanks, MD; Location: [facility] CATH LAB; Service: Cardiovascular; Laterality: N/A;  . Cardiac catheterization  12/11/2013    Procedure: CORONARY STENT INTERVENTION; Surgeon: Burnell Blanks, MD; Location: [facility] CATH LAB; Service: Cardiovascular;; DES 3.5 x 15 Resolute to seq SVG of OM1/OM2   . Permanent pacemaker insertion N/A 12/12/2013    MDT ADDRL1 pacemaker implnated by Dr Rayann Heman    History  Smoking status  . Former Smoker  . Quit date: 01/06/1956  Smokeless tobacco  . Not on file    History  Alcohol Use No    Family History  Problem Relation Age of Onset  . Diabetes Mother   . Coronary artery disease Father     Review of Systems: The review of systems is per the HPI. All other systems were reviewed and are negative.  Physical Exam: BP 110/40 mmHg  Pulse 56  Ht 5\' 7"  (1.702 m)  Wt 174 lb 12.8 oz  (79.289 kg)  BMI 27.37 kg/m2  SpO2 94% Patient is very pleasant and in no acute distress. Weight is up. Evan [name] is hard of hearing. Evan [name] is in a wheelchair. Skin is warm and dry. Color is sallow/pale. HEENT is unremarkable but has poor dentition. Normocephalic/atraumatic. PERRL. Sclera are nonicteric. Neck is supple. No masses. No JVD. Lungs are clear. Cardiac exam shows a regular rate and rhythm. Harsh outflow murmur. Abdomen is distended with decreased bowel sounds. Noted to  have sacral edema. Extremities are with 2 to 3+ edema. Gait is not tested. No gross neurologic deficits noted.  Wt Readings from Last 3 Encounters:  01/16/14 174 lb 12.8 oz (79.289 kg)  01/01/14 176 lb (79.833 kg)  12/13/13 162 lb 0.6 oz (73.5 kg)    LABORATORY DATA/PROCEDURES: EKG shows A pacing, V sensing - rate 56. Diffuse T wave changes.    Recent Labs    Lab Results  Component Value Date   WBC 4.8 12/12/2013   HGB 13.5 12/12/2013   HCT 41.2 12/12/2013   PLT 173 12/12/2013   GLUCOSE 141* 12/12/2013   ALT 18 12/07/2013   AST 21 12/07/2013   NA 143 12/12/2013   K 4.0 12/12/2013   CL 106 12/12/2013   CREATININE 1.12 12/12/2013   BUN 18 12/12/2013   CO2 21 12/12/2013   INR 1.02 12/07/2013      BNP (last 3 results)  Recent Labs (within last 365 days)    No results for input(s): PROBNP in the last 8760 hours.    Echo Study Conclusions from 12/2013  - Left ventricle: The cavity size was normal. Wall thickness was normal. Systolic function was mildly reduced. The estimated ejection fraction was in the range of 45% to 50%. Diffuse hypokinesis. Doppler parameters are consistent with abnormal left ventricular relaxation (grade 1 diastolic dysfunction). - Aortic valve: There was mild regurgitation. - Mitral valve: Calcified annulus. - Left atrium: The atrium was mildly dilated.   Angiographic Findings from 12/2013:  Left  main: 30% distal stenosis.   Left Anterior Descending Artery: 100% proximal occlusion. Mid and distal LAD fills from the patent vein graft. The diagonal fills from the patent vein graft.   Circumflex Artery: 100% proximal occlusion. The first and second OM branches fill from the patent vein graft.   Right Coronary Artery: Large dominant vessel with diffuse 99% stenosis in the proximal, mid and distal vessel. The PDA and PLA fill from the patent vein graft.   Graft Anatomy:  SVG to PDA is patent SVG to OM1/OM2 is patent with 95% stenosis mid body of SVG SVG to LAD is patent SVG to Diagonal is patent  Left Ventricular Angiogram: Deferred.   Impression: 1. Severe triple vessel disease s/p 5 vessel CABG with 5/5 patent bypass grafts. 2. Severe stenosis mid body of SVG to OM 3. Unstable angina 4. Successful PTCA/DES x 1 mid body of SVG to OM1/OM2  Recommendations: Continue ASA and Plavix for one year.        Assessment / Plan: 1. Acute on chronic combined systolic and diastolic HF - Evan [name] is quite volume overloaded. Little short of breath with just talking - will need admission for diuresis. Do not feel it would be safe to discharge back to assisted living at this time with oral diuretics with risk of falling. Discussed with Dr. Angelena Form who is in agreement with this plan. Daughter "[name]" called to update and she is agreeable to this plan.   2. Recent PCI/DES to SVG to OM1/OM2 - No active chest pain - continue DAPT  3. Recent PPM placement - Site looks ok - device is checked - his rate is set at 50 - it is not clear to me as to why - I have had his rate changed to 60.   4. Advanced age  81. AS - with recent echo noted.   6. Leg pain - may need to consider lower extremity doppler study  7. PAF - will [name] ahead  and cut the amiodarone back to just one pill a day. NO episodes noted on his device check here in the office today.   8. HTN - BP lower - would cut Norvasc back  - this may help improve his swelling some.   To transfer by EMS. Cardiology to follow up later today.   Patient is agreeable to this plan and will call if any problems develop in the interim.   Burtis Junes, RN, [facility] [address] [place], [place] [address] [phone]               Referring Provider     Horatio Pel, MD     Diagnoses     Acute on chronic combined systolic and diastolic ACC/AHA stage C congestive heart failure - Primary    ICD-9-CM: 428.43, 428.0 ICD-10-CM: I50.43    Sick sinus syndrome     ICD-9-CM: 427.81 ICD-10-CM: I49.5    Pacemaker     ICD-9-CM: V45.01 ICD-10-CM: Z95.0       Reason for Visit     Follow-up    Work in visit - seen for [name].     Reason for Visit History        Discontinued Medications       Reason for Discontinue    MULTIPLE VITAMIN PO Completed Course      Level of Service     LOS - NO CHARGE [NC1]      Follow-up and Disposition     Routing History       All Charges for This Encounter     Code Description Service Date Service Provider Modifiers Qty    NC1 LOS - NO CHARGE 01/16/2014 Burtis Junes, NP  1      AVS Reports     Date/Time Report Action User    01/16/2014 7:28 AM After Visit Summary Printed Tamsen Snider      Patient Instructions     We are going to admit you to the hospital today        Patient Instructions History      Routing History     There are no sent or routed communications associated with this encounter.     Previous Visit       Provider Department Encounter #    01/01/2014 2:00 PM Truitt Merle, NP [facility] [id number]      I have personally seen and examined this patient with Truitt Merle, NP.  I agree with the assessment and plan as outlined above. Evan [name] is volume overloaded. Evan [name] will be admitted for  diuresis. Pacemaker rate adjusted this am.   MCALHANY,CHRISTOPHER 01/16/2014 1:45 PM

## 2014-01-16 NOTE — Progress Notes (Signed)
Quick check per Cecille Rubin due to 12lb fluid weight gain. No episodes or alerts. Pt A-pacing 82.2%. Changed lower rate from 50 to 60bpm. Pt admitted.

## 2014-01-16 NOTE — Patient Instructions (Addendum)
We are going to admit you to the hospital today. 

## 2014-01-16 NOTE — Progress Notes (Signed)
MRSA NEGATIVE. C-DIFF NEGATIVE.  Pt taken off of Brown enteric precautions.

## 2014-01-17 ENCOUNTER — Observation Stay (HOSPITAL_COMMUNITY): Payer: Medicare Other

## 2014-01-17 DIAGNOSIS — Z950 Presence of cardiac pacemaker: Secondary | ICD-10-CM

## 2014-01-17 DIAGNOSIS — E780 Pure hypercholesterolemia: Secondary | ICD-10-CM | POA: Diagnosis present

## 2014-01-17 DIAGNOSIS — E1122 Type 2 diabetes mellitus with diabetic chronic kidney disease: Secondary | ICD-10-CM | POA: Diagnosis present

## 2014-01-17 DIAGNOSIS — I2510 Atherosclerotic heart disease of native coronary artery without angina pectoris: Secondary | ICD-10-CM | POA: Diagnosis present

## 2014-01-17 DIAGNOSIS — N183 Chronic kidney disease, stage 3 (moderate): Secondary | ICD-10-CM | POA: Diagnosis present

## 2014-01-17 DIAGNOSIS — Z8673 Personal history of transient ischemic attack (TIA), and cerebral infarction without residual deficits: Secondary | ICD-10-CM | POA: Diagnosis not present

## 2014-01-17 DIAGNOSIS — K219 Gastro-esophageal reflux disease without esophagitis: Secondary | ICD-10-CM | POA: Diagnosis present

## 2014-01-17 DIAGNOSIS — Z955 Presence of coronary angioplasty implant and graft: Secondary | ICD-10-CM | POA: Diagnosis not present

## 2014-01-17 DIAGNOSIS — I129 Hypertensive chronic kidney disease with stage 1 through stage 4 chronic kidney disease, or unspecified chronic kidney disease: Secondary | ICD-10-CM | POA: Diagnosis present

## 2014-01-17 DIAGNOSIS — I10 Essential (primary) hypertension: Secondary | ICD-10-CM | POA: Diagnosis present

## 2014-01-17 DIAGNOSIS — I5043 Acute on chronic combined systolic (congestive) and diastolic (congestive) heart failure: Principal | ICD-10-CM

## 2014-01-17 DIAGNOSIS — Z87891 Personal history of nicotine dependence: Secondary | ICD-10-CM | POA: Diagnosis not present

## 2014-01-17 DIAGNOSIS — Z7982 Long term (current) use of aspirin: Secondary | ICD-10-CM | POA: Diagnosis not present

## 2014-01-17 DIAGNOSIS — H9190 Unspecified hearing loss, unspecified ear: Secondary | ICD-10-CM | POA: Diagnosis present

## 2014-01-17 DIAGNOSIS — I480 Paroxysmal atrial fibrillation: Secondary | ICD-10-CM | POA: Diagnosis present

## 2014-01-17 DIAGNOSIS — I350 Nonrheumatic aortic (valve) stenosis: Secondary | ICD-10-CM | POA: Diagnosis present

## 2014-01-17 DIAGNOSIS — E785 Hyperlipidemia, unspecified: Secondary | ICD-10-CM | POA: Diagnosis present

## 2014-01-17 DIAGNOSIS — Z951 Presence of aortocoronary bypass graft: Secondary | ICD-10-CM | POA: Diagnosis not present

## 2014-01-17 DIAGNOSIS — E1121 Type 2 diabetes mellitus with diabetic nephropathy: Secondary | ICD-10-CM | POA: Diagnosis present

## 2014-01-17 LAB — BASIC METABOLIC PANEL
Anion gap: 11 (ref 5–15)
BUN: 16 mg/dL (ref 6–23)
CO2: 23 mmol/L (ref 19–32)
Calcium: 8.8 mg/dL (ref 8.4–10.5)
Chloride: 106 mEq/L (ref 96–112)
Creatinine, Ser: 1.32 mg/dL (ref 0.50–1.35)
GFR calc Af Amer: 54 mL/min — ABNORMAL LOW (ref >90)
GFR calc non Af Amer: 46 mL/min — ABNORMAL LOW (ref >90)
Glucose, Bld: 147 mg/dL — ABNORMAL HIGH (ref 70–99)
Potassium: 3.2 mmol/L — ABNORMAL LOW (ref 3.5–5.1)
Sodium: 140 mmol/L (ref 135–145)

## 2014-01-17 LAB — GLUCOSE, CAPILLARY: Glucose-Capillary: 144 mg/dL — ABNORMAL HIGH (ref 70–99)

## 2014-01-17 LAB — TROPONIN I: Troponin I: 0.22 ng/mL — ABNORMAL HIGH (ref <0.031)

## 2014-01-17 MED ORDER — POTASSIUM CHLORIDE 20 MEQ/15ML (10%) PO SOLN
40.0000 meq | Freq: Two times a day (BID) | ORAL | Status: AC
  Administered 2014-01-17 – 2014-01-19 (×5): 40 meq via ORAL
  Filled 2014-01-17 (×6): qty 30

## 2014-01-17 MED ORDER — CARVEDILOL 12.5 MG PO TABS
12.5000 mg | ORAL_TABLET | Freq: Two times a day (BID) | ORAL | Status: DC
  Administered 2014-01-17 – 2014-01-20 (×6): 12.5 mg via ORAL
  Filled 2014-01-17 (×8): qty 1

## 2014-01-17 MED ORDER — FUROSEMIDE 10 MG/ML IJ SOLN
80.0000 mg | Freq: Every day | INTRAMUSCULAR | Status: DC
  Administered 2014-01-17: 40 mg via INTRAVENOUS
  Administered 2014-01-18: 80 mg via INTRAVENOUS
  Filled 2014-01-17: qty 8

## 2014-01-17 MED ORDER — POTASSIUM CHLORIDE 20 MEQ PO PACK: 40.0000 meq | PACK | Freq: Two times a day (BID) | ORAL | Status: DC

## 2014-01-17 MED ORDER — POTASSIUM CHLORIDE CRYS ER 20 MEQ PO TBCR
40.0000 meq | EXTENDED_RELEASE_TABLET | Freq: Once | ORAL | Status: AC
  Administered 2014-01-17: 40 meq via ORAL
  Filled 2014-01-17: qty 2

## 2014-01-17 NOTE — Evaluation (Signed)
Physical Therapy Evaluation Patient Details Name: Evan [name] MRN: [id number] DOB: [birth year] Today's Date: 01/17/2014   History of Present Illness  Patient is an 81 yo male who presents with Acute on chronic combined systolic and diastolic HF  Clinical Impression  Patient demonstrates deficits in functional mobility as indicated below. Will need continued skilled PT to address deficits and maximize function. Will see as indicated and progress as tolerated.  OF NOTE: During ambulation 127ft patient did start to report fatigue and pain in legs and said he would start to feel a "lump in his chest" if he went much further.  Oxygen assessed and patient desaturated from 97% on room air to 84% with ambulation. HR 60.  May need to consider supplemental O2 during activity. No physical assist required throughout session, and patient was able to perform various self care tasks and transfers prior to ambulation. Anticipate that patient will be safe to return to facility and resume his HHPT through [place] (he states that he had just been evaluated by [place] the day before admission)    Follow Up Recommendations Home health PT (resume HHPT sevices with [place])    Equipment Recommendations    None recommended by PT  Recommendations for Other Services       Precautions / Restrictions Precautions Precautions: Fall Restrictions Weight Bearing Restrictions: No      Mobility  Bed Mobility               General bed mobility comments: received up in chair  Transfers Overall transfer level: Needs assistance Equipment used: Rolling walker (2 wheeled);4-wheeled walker Transfers: Sit to/from Stand Sit to Stand: Supervision         General transfer comment: performed x3 with no physical assist required, performed from low surface of the toilet with heavy reliance on grab bar and close supervision, no physical assist  Ambulation/Gait Ambulation/Gait assistance: Supervision;Modified  independent (Device/Increase time) Ambulation Distance (Feet): 180 Feet Assistive device: 4-wheeled walker Gait Pattern/deviations: Step-through pattern;Decreased stride length;Trunk flexed Gait velocity: modestly decreased Gait velocity interpretation: at or above normal speed for age/gender General Gait Details: patient with modestly decreased speed during ambulation as distance progressed, VS monitored and patient began to desaturated to 84% on room air with activity >149ft, approximately 3 minutes to rebound on room air  Stairs            Wheelchair Mobility    Modified Rankin (Stroke Patients Only)       Balance Overall balance assessment: History of Falls;Needs assistance   Sitting balance-Leahy Scale: Good       Standing balance-Leahy Scale: Fair Standing balance comment: patient was able to stand and pull up his briefs without physical assist, patient also able to perform dynamic and static activities with close supervision, no evidence of significant instability                              Pertinent Vitals/Pain Pain Assessment: Faces Faces Pain Scale: Hurts a little bit Pain Location: legs after walking 180 ft Pain Descriptors / Indicators: Discomfort Pain Intervention(s): Limited activity within patient's tolerance;Monitored during session;Repositioned    Home Living Family/patient expects to be discharged to:: Assisted living               Home Equipment: Walker - 4 wheels      Prior Function Level of Independence: Independent with assistive device(s)  Hand Dominance        Extremity/Trunk Assessment   Upper Extremity Assessment: Defer to OT evaluation           Lower Extremity Assessment: Generalized weakness         Communication      Cognition Arousal/Alertness: Awake/alert Behavior During Therapy: WFL for tasks assessed/performed Overall Cognitive Status: Within Functional Limits for tasks  assessed                      General Comments      Exercises        Assessment/Plan    PT Assessment Patient needs continued PT services  PT Diagnosis Difficulty walking   PT Problem List Decreased strength;Decreased activity tolerance;Decreased balance;Decreased mobility;Cardiopulmonary status limiting activity  PT Treatment Interventions DME instruction;Gait training;Functional mobility training;Therapeutic activities;Therapeutic exercise;Balance training;Patient/family education   PT Goals (Current goals can be found in the Care Plan section) Acute Rehab PT Goals Patient Stated Goal: to get back home PT Goal Formulation: With patient Time For Goal Achievement: 01/31/14 Potential to Achieve Goals: Good    Frequency Min 3X/week   Barriers to discharge        Co-evaluation               End of Session Equipment Utilized During Treatment: Gait belt Activity Tolerance: Patient tolerated treatment well;Patient limited by fatigue Patient left: in chair;with call bell/phone within reach;with chair alarm set Nurse Communication: Mobility status         Time: 1142-1209 PT Time Calculation (min) (ACUTE ONLY): 27 min   Charges:   PT Evaluation $Initial PT Evaluation Tier I: 1 Procedure PT Treatments $Gait Training: 8-22 mins $Therapeutic Activity: 8-22 mins   PT G CodesDuncan [name] 02/15/14, 1:55 PM Evan [name], [place] DPT  [phone]

## 2014-01-17 NOTE — Care Management Note (Unsigned)
    Page 1 of 1   01/19/2014     4:35:53 PM CARE MANAGEMENT NOTE 01/19/2014  Patient:  Evan [name], Evan [name]   Account Number:  [id number]  Date Initiated:  01/17/2014  Documentation initiated by:  Flossie Wexler  Subjective/Objective Assessment:   Pt adm on 01/16/14 with CHF.  PTA, pt resided at [facility].     Action/Plan:   CSW consulted to facilitate return to ALF when medically stable for dc.  Will follow progress.   Anticipated DC Date:  01/20/2014   Anticipated DC Plan:  ASSISTED LIVING / REST HOME  In-house referral  Clinical Social Worker      DC Planning Services  CM consult      Choice offered to / List presented to:             Status of service:  In process, will continue to follow Medicare Important Message given?  YES (If response is "NO", the following Medicare IM given date fields will be blank) Date Medicare IM given:  01/19/2014 Medicare IM given by:  Miakoda Mcmillion Date Additional Medicare IM given:   Additional Medicare IM given by:    Discharge Disposition:    Per UR Regulation:  Reviewed for med. necessity/level of care/duration of stay  If discussed at [place] of Stay Meetings, dates discussed:    Comments:  01/18/14 Ellan Lambert, RN, BSN [phone] Pt active with [facility] prior to admission for HHPT/OT.   Will need resumption orders for [facility] services to continue.  PT/OT currently recommending HH follow up.  Will follow.

## 2014-01-17 NOTE — Progress Notes (Signed)
I have talked with patient's daughter, Ceasar Decandia and updated her on current progress. All questions answered.  Hilbert Corrigan PA Pager: [phone]

## 2014-01-17 NOTE — Progress Notes (Signed)
Patient Name: Evan [name] Date of Encounter: 01/17/2014     Active Problems:   Acute on chronic systolic and diastolic heart failure, NYHA class 4   Acute on chronic combined systolic and diastolic congestive heart failure    SUBJECTIVE  Continue to have SOB, unchanged since yesterday. States his legs hurt after walking about 25 feet. Denies any CP  CURRENT MEDS . amiodarone  200 mg Oral Daily  . amLODipine  5 mg Oral Daily  . aspirin EC  81 mg Oral Daily  . atorvastatin  10 mg Oral q1800  . carvedilol  6.25 mg Oral BID WC  . clopidogrel  75 mg Oral Daily  . finasteride  5 mg Oral Daily  . furosemide  40 mg Intravenous BID  . heparin  5,000 Units Subcutaneous 3 times per day  . hydrALAZINE  50 mg Oral 3 times per day  . isosorbide mononitrate  30 mg Oral Daily  . linagliptin  5 mg Oral Daily  . pantoprazole  40 mg Oral Daily  . sodium chloride  3 mL Intravenous Q12H    OBJECTIVE  Filed Vitals:   01/17/14 0200 01/17/14 0600 01/17/14 0614 01/17/14 1022  BP: 152/50 150/67 150/57 177/49  Pulse: 60 60    Temp: 97.9 F (36.6 C) 98.6 F (37 C)    TempSrc: Oral Oral    Resp: 16 16    Height:      Weight:  169 lb 6.4 oz (76.839 kg)    SpO2: 93% 93%      Intake/Output Summary (Last 24 hours) at 01/17/14 1049 Last data filed at 01/17/14 0835  Gross per 24 hour  Intake    679 ml  Output   1705 ml  Net  -1026 ml   Filed Weights   01/16/14 1100 01/17/14 0600  Weight: 174 lb 12.8 oz (79.289 kg) 169 lb 6.4 oz (76.839 kg)    PHYSICAL EXAM  General: Pleasant, NAD. Neuro: Alert and oriented X 3. Moves all extremities spontaneously. Psych: Normal affect. HEENT:  Normal  Neck: Supple without bruits  +JVD. Lungs:  Resp regular and unlabored. diminished breath sound bilaterally. L basilar rale Heart: RRR no s3, s4, or murmurs. Abdomen: Soft, non-tender, non-distended, BS + x 4.  Extremities: No clubbing, cyanosis. 2+ pitting edema.   Accessory Clinical  Findings  CBC  Recent Labs  01/16/14 1147  WBC 7.2  NEUTROABS 6.0  HGB 10.9*  HCT 34.0*  MCV 86.7  PLT 314   Basic Metabolic Panel  Recent Labs  01/16/14 1147 01/17/14 0424  NA 143 140  K 4.0 3.2*  CL 109 106  CO2 23 23  GLUCOSE 181* 147*  BUN 19 16  CREATININE 1.40* 1.32  CALCIUM 9.2 8.8  MG 2.1  --    Liver Function Tests  Recent Labs  01/16/14 1147  AST 20  ALT 19  ALKPHOS 75  BILITOT 1.1  PROT 6.0  ALBUMIN 3.6   No results for input(s): LIPASE, AMYLASE in the last 72 hours. Cardiac Enzymes  Recent Labs  01/16/14 1147 01/16/14 1715 01/16/14 2321  TROPONINI 0.07* 0.16* 0.22*   Thyroid Function Tests  Recent Labs  01/16/14 1147  TSH 7.254*    TELE NSR    ECG  No new EKG  Echocardiogram 12/08/2013  LV EF: 45% -  50%  ------------------------------------------------------------------- Indications:   Aortic stenosis 424.1.  ------------------------------------------------------------------- History:  PMH: Bradycardia. Murmur. Heart block. Coronary artery disease. Risk factors: Hypertension. Diabetes  mellitus. Dyslipidemia.  ------------------------------------------------------------------- Study Conclusions  - Left ventricle: The cavity size was normal. Wall thickness was normal. Systolic function was mildly reduced. The estimated ejection fraction was in the range of 45% to 50%. Diffuse hypokinesis. Doppler parameters are consistent with abnormal left ventricular relaxation (grade 1 diastolic dysfunction). - Aortic valve: There was mild regurgitation. - Mitral valve: Calcified annulus. - Left atrium: The atrium was mildly dilated    Radiology/Studies  Dg Chest 2 View  01/17/2014   CLINICAL DATA:  Shortness of breath, history of coronary artery disease, CABG. Pacemaker replacement in December 2015  EXAM: CHEST  2 VIEW  COMPARISON:  PA and lateral chest of December 13, 2013  FINDINGS: There are fluffy  bilateral alveolar infiltrates. The lung volumes are low. The retrocardiac region is dense on the left. There are bilateral pleural effusions layering posteriorly. The cardiac silhouette is mildly enlarged. The permanent pacemaker is in appropriate position radiographically. There are post CABG changes.  IMPRESSION: Congestive heart failure with bilateral pulmonary interstitial and alveolar edema with bilateral pleural effusions.   Electronically Signed   By: David  Martinique   On: 01/17/2014 08:18    ASSESSMENT AND PLAN  1. Acute on chronic combined systolic and diastolic HF  - 735 lbs in Dec, admission weight 174 lbs  - continue IV diuresis but change to 80mg  IV daily, increase coreg to 12.5  2. CAD s/p recent DES to SVG to OM1/OM2  - continue ASA and plavix  3. 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 AS  5. Leg pain: obtain arterial doppler, difficult to palpate pulses  6. PAF: continue amiodarone  7. HTN: consider increase coreg to 12.5 mg to better control HTN  8. Advanced age  Weston Brass Woodward Ku Pager: [phone]  Personally seen and examined. Agree with above. Lasix increase. Watch with age. Creat.  Candee Furbish, MD

## 2014-01-18 DIAGNOSIS — R7989 Other specified abnormal findings of blood chemistry: Secondary | ICD-10-CM

## 2014-01-18 DIAGNOSIS — I2510 Atherosclerotic heart disease of native coronary artery without angina pectoris: Secondary | ICD-10-CM

## 2014-01-18 DIAGNOSIS — I739 Peripheral vascular disease, unspecified: Secondary | ICD-10-CM

## 2014-01-18 LAB — BASIC METABOLIC PANEL
Anion gap: 16 — ABNORMAL HIGH (ref 5–15)
BUN: 16 mg/dL (ref 6–23)
CO2: 24 mmol/L (ref 19–32)
Calcium: 9.4 mg/dL (ref 8.4–10.5)
Chloride: 103 mEq/L (ref 96–112)
Creatinine, Ser: 1.35 mg/dL (ref 0.50–1.35)
GFR calc Af Amer: 52 mL/min — ABNORMAL LOW (ref >90)
GFR calc non Af Amer: 45 mL/min — ABNORMAL LOW (ref >90)
Glucose, Bld: 189 mg/dL — ABNORMAL HIGH (ref 70–99)
Potassium: 4.3 mmol/L (ref 3.5–5.1)
Sodium: 143 mmol/L (ref 135–145)

## 2014-01-18 LAB — GLUCOSE, CAPILLARY
Glucose-Capillary: 221 mg/dL — ABNORMAL HIGH (ref 70–99)
Glucose-Capillary: 168 mg/dL — ABNORMAL HIGH (ref 70–99)

## 2014-01-18 MED ORDER — FUROSEMIDE 10 MG/ML IJ SOLN
80.0000 mg | Freq: Two times a day (BID) | INTRAMUSCULAR | Status: DC
  Administered 2014-01-18 – 2014-01-19 (×3): 80 mg via INTRAVENOUS
  Filled 2014-01-18 (×6): qty 8

## 2014-01-18 MED ORDER — FUROSEMIDE 10 MG/ML IJ SOLN: 40.0000 mg | Freq: Once | INTRAMUSCULAR | Status: DC

## 2014-01-18 MED ORDER — HYDRALAZINE HCL 50 MG PO TABS
75.0000 mg | ORAL_TABLET | Freq: Three times a day (TID) | ORAL | Status: DC
  Administered 2014-01-18 – 2014-01-20 (×6): 75 mg via ORAL
  Filled 2014-01-18 (×10): qty 1

## 2014-01-18 NOTE — Progress Notes (Signed)
*  PRELIMINARY RESULTS* Vascular Ultrasound Left Lower Extremity Arterial Duplex has been completed.   There is evidence of elevated left distal popliteal artery velocities, suggestive of 50-99% stenosis.  ABI completed:    RIGHT    LEFT    PRESSURE WAVEFORM  PRESSURE WAVEFORM  BRACHIAL 163 Triphasic BRACHIAL 156 Triphasic  DP 147 Biphasic DP 115 Monophasic  AT   AT    PT 141 Dampened monophasic PT  Unable to insonate.  PER   PER    GREAT TOE  NA GREAT TOE  NA    RIGHT LEFT  ABI 0.9 0.71   The right ABI is suggestive of mild arterial insufficiency. The left ABI is suggestive of moderate arterial insufficiency.  01/18/2014 4:24 PM Maudry Mayhew, RVT, RDCS, RDMS

## 2014-01-18 NOTE — Progress Notes (Signed)
Subjective: Did not sleep much.  Was in the chair mostly which improves his breathing.    Objective: Vital signs in last 24 hours: Temp:  [98.4 F (36.9 C)-98.8 F (37.1 C)] 98.4 F (36.9 C) (01/14 0458) Pulse Rate:  [59-60] 60 (01/14 0609) Resp:  [16-18] 16 (01/14 0458) BP: (120-186)/(43-58) 154/58 mmHg (01/14 0609) SpO2:  [93 %-94 %] 94 % (01/14 0458) Weight:  [167 lb 14.4 oz (76.159 kg)] 167 lb 14.4 oz (76.159 kg) (01/14 0458) Last BM Date: 01/17/14  Intake/Output from previous day: 01/13 0701 - 01/14 0700 In: 1145 [P.O.:1145] Out: 1100 [Urine:1100] Intake/Output this shift: Total I/O In: 540 [P.O.:540] Out: -   Medications Current Facility-Administered Medications  Medication Dose Route Frequency Provider Last Rate Last Dose  . 0.9 %  sodium chloride infusion  250 mL Intravenous PRN Burtis Junes, NP      . acetaminophen (TYLENOL) tablet 650 mg  650 mg Oral Q4H PRN Burtis Junes, NP      . amiodarone (PACERONE) tablet 200 mg  200 mg Oral Daily Burtis Junes, NP   200 mg at 01/17/14 1021  . amLODipine (NORVASC) tablet 5 mg  5 mg Oral Daily Burtis Junes, NP   5 mg at 01/17/14 1022  . aspirin EC tablet 81 mg  81 mg Oral Daily Burtis Junes, NP   81 mg at 01/17/14 1022  . atorvastatin (LIPITOR) tablet 10 mg  10 mg Oral q1800 Burtis Junes, NP   10 mg at 01/17/14 1820  . carvedilol (COREG) tablet 12.5 mg  12.5 mg Oral BID WC Almyra Deforest, PA   12.5 mg at 01/18/14 0608  . clopidogrel (PLAVIX) tablet 75 mg  75 mg Oral Daily Burtis Junes, NP   75 mg at 01/17/14 1021  . finasteride (PROSCAR) tablet 5 mg  5 mg Oral Daily Burtis Junes, NP   5 mg at 01/17/14 1021  . furosemide (LASIX) injection 80 mg  80 mg Intravenous Daily Almyra Deforest, PA   40 mg at 01/17/14 1117  . heparin injection 5,000 Units  5,000 Units Subcutaneous 3 times per day Burtis Junes, NP   5,000 Units at 01/18/14 [phone]  . hydrALAZINE (APRESOLINE) tablet 50 mg  50 mg Oral 3 times per day Burtis Junes, NP   50 mg at 01/18/14 0609  . isosorbide mononitrate (IMDUR) 24 hr tablet 30 mg  30 mg Oral Daily Burtis Junes, NP   30 mg at 01/17/14 1022  . linagliptin (TRADJENTA) tablet 5 mg  5 mg Oral Daily Burtis Junes, NP   5 mg at 01/17/14 1021  . ondansetron (ZOFRAN) injection 4 mg  4 mg Intravenous Q6H PRN Burtis Junes, NP      . pantoprazole (PROTONIX) EC tablet 40 mg  40 mg Oral Daily Burtis Junes, NP   40 mg at 01/17/14 1030  . potassium chloride 20 MEQ/15ML (10%) solution 40 mEq  40 mEq Oral BID [name], MD   40 mEq at 01/17/14 2125  . sodium chloride 0.9 % injection 3 mL  3 mL Intravenous Q12H Burtis Junes, NP   3 mL at 01/17/14 2125  . sodium chloride 0.9 % injection 3 mL  3 mL Intravenous PRN Burtis Junes, NP      . traMADol Veatrice Bourbon) tablet 50 mg  50 mg Oral Q6H PRN Burtis Junes, NP  PE: General appearance: alert, cooperative and no distress Lungs: clear to auscultation bilaterally Heart: regular rate and rhythm and 2/6 sys MM Abdomen: + distension.  + BS, Nontender Extremities: 1+ LEE Pulses: 2+ and symmetric Skin: Warm and dry Neurologic: Grossly normal  Lab Results:   Recent Labs  01/16/14 1147  WBC 7.2  HGB 10.9*  HCT 34.0*  PLT 284   BMET  Recent Labs  01/16/14 1147 01/17/14 0424 01/18/14 0520  NA 143 140 143  K 4.0 3.2* 4.3  CL 109 106 103  CO2 23 23 24   GLUCOSE 181* 147* 189*  BUN 19 16 16   CREATININE 1.40* 1.32 1.35  CALCIUM 9.2 8.8 9.4   PT/INR  Recent Labs  01/16/14 1147  LABPROT 16.1*  INR 1.28    Assessment/Plan   Active Problems:   Acute on chronic systolic and diastolic heart failure, NYHA class 4   Acute on chronic combined systolic and diastolic congestive heart failure  1. Acute on chronic combined systolic and diastolic HF 740 lbs in Dec, admission weight 174 lbs and now 167.  He does not appear to have diuresed much overnight Continue IV diuresis 80mg  IV qam and add 40mg  this evening and  reassess in AM.   Net fluids:  +0.05L/ -1.0L.  SCr stable.  2. CAD s/p recent DES to SVG to OM1/OM2 - continue ASA and plavix, coreg  3. Recent Medtronic dual chamber PPM for symptomatic 2nd degree AV block - pacemaker rate was 50, adjusted to 60 yesterday in office  4. AS  5. Leg pain: obtain arterial doppler, difficult to palpate pulses-pending  6. PAF: continue amiodarone  7. HTN: coreg 12.5 mg, amlodipine 5, hydralazine 50Q8.  Increase to 75.  BP still not well controlled.  8. Advanced age  81.  Hypoxemia with ambulation 87%.  Will need home O2.   10.  Elevated troponin  0.22.  Likely from CHF.   LOS: 2 days    HAGER, BRYAN PA-C 01/18/2014 8:51 AM  Personally seen and examined. Agree with above. I know he is frustrated by urinating at night, but we need to take this opportunity to use IV lasix BID. Will actually change to 80 BID.  Agree with hydral increase BP should go down with diuresis.   Candee Furbish, MD

## 2014-01-18 NOTE — Progress Notes (Signed)
Physical Therapy Treatment Patient Details Name: Evan [name] MRN: [id number] DOB: 1923/05/16 Today's Date: 2014/01/19    History of Present Illness Patient is an 81 yo male who presents with Acute on chronic combined systolic and diastolic HF    PT Comments    Pt amb well but with limited activity tolerance likely due to decr SaO2 on RA. With supplemental O2 pt able to maintain adequate SaO2.  Follow Up Recommendations  Home health PT (resume with [facility] at ALF facility)     Equipment Recommendations  Other (comment) (O2)    Recommendations for Other Services       Precautions / Restrictions Precautions Precautions: None Restrictions Weight Bearing Restrictions: No    Mobility  Bed Mobility               General bed mobility comments: received up in chair  Transfers Overall transfer level: Modified independent   Transfers: Sit to/from Stand Sit to Stand: Modified independent (Device/Increase time)            Ambulation/Gait Ambulation/Gait assistance: Supervision;Modified independent (Device/Increase time) Ambulation Distance (Feet): 175 Feet Assistive device: 4-wheeled walker Gait Pattern/deviations: Step-through pattern;Decreased stride length;Trunk flexed     General Gait Details: Began amb on RA. Monitored SaO2 and when pt reported beginnings of fatigue SaO2 87%. Placed pt on O2 at 2L for rest of amb and SaO2 incr to  94%.   Stairs            Wheelchair Mobility    Modified Rankin (Stroke Patients Only)       Balance     Sitting balance-Leahy Scale: Good       Standing balance-Leahy Scale: Fair                      Cognition Arousal/Alertness: Awake/alert Behavior During Therapy: WFL for tasks assessed/performed Overall Cognitive Status: Within Functional Limits for tasks assessed                      Exercises      General Comments        Pertinent Vitals/Pain Pain Assessment: No/denies pain     Home Living                      Prior Function            PT Goals (current goals can now be found in the care plan section) Progress towards PT goals: Progressing toward goals    Frequency  Min 3X/week    PT Plan Current plan remains appropriate    Co-evaluation             End of Session Equipment Utilized During Treatment: Oxygen Activity Tolerance: Patient tolerated treatment well Patient left: in chair;with call bell/phone within reach;with chair alarm set     Time: 0240-9735 PT Time Calculation (min) (ACUTE ONLY): 19 min  Charges:  $Gait Training: 8-22 mins                    G Codes:      Jamiaya Bina 2014/01/19, 9:21 AM  [facility] PT [phone]

## 2014-01-18 NOTE — Progress Notes (Signed)
SATURATION QUALIFICATIONS: (This note is used to comply with regulatory documentation for home oxygen)  Patient Saturations on Room Air at Rest = 90%  Patient Saturations on Room Air while Ambulating = 87%  Patient Saturations on 2 Liters of oxygen while Ambulating = 94%  Please briefly explain why patient needs home oxygen:Pt with falling SaO2 with activity while on RA.  [employer] PT [phone]

## 2014-01-18 NOTE — Evaluation (Signed)
Occupational Therapy Evaluation Patient Details Name: JENNY OMDAHL MRN: [id number] DOB: 1923/11/27 Today's Date: 01/18/2014    History of Present Illness Patient is an 81 yo male who presents with Acute on chronic combined systolic and diastolic HF   Clinical Impression   Pt presents as above impacting his ability to perform functional mobility and ADL's secondary to generalized weakness and SOB w/ all activity. Pt may benefit from supplemental O2. He should also benefit from acute OT to assist in maximizing independence in problem areas (listed below).    Follow Up Recommendations  Home health OT;Supervision - Intermittent    Equipment Recommendations  None recommended by OT    Recommendations for Other Services       Precautions / Restrictions Precautions Precautions: None Restrictions Weight Bearing Restrictions: No      Mobility Bed Mobility               General bed mobility comments: received up in chair  Transfers Overall transfer level: Modified independent Equipment used: 4-wheeled walker Transfers: Sit to/from [employer] Transfers Sit to Stand: Modified independent (Device/Increase time) Stand pivot transfers: Modified independent (Device/Increase time)       General transfer comment: Pt getting up out of chair today and amb to bathroom as OT was beginning to enter, Nurse tech received pt as he stated he was going to the bathroom. Pt sit to stand  from toilet Mod I (used grab bar and 4 wheeled walker, and stood at sink for grooming followed by transfer to recliner chair Mod I. Pt is limited by SOB throughout.    Balance Overall balance assessment: Needs assistance;History of Falls Sitting-balance support: No upper extremity supported;Feet supported Sitting balance-Leahy Scale: Good     Standing balance support: Bilateral upper extremity supported Standing balance-Leahy Scale: Fair Standing balance comment: Pt able to don/doff briefs in  bathroom w/o physical assist and stood at sink for grooming. Benefits from rest breaks and most likely supplemental O2 secondary to SOB noted. No evidence od significant instability.                            ADL Overall ADL's : Needs assistance/impaired     Grooming: Wash/dry hands;Wash/dry face;Standing;Supervision/safety Grooming Details (indicate cue type and reason):  (Close supervision as pt becomes SOB easily, may benefit from supplemental O2. Rest breaks) Upper Body Bathing: Modified independent;Set up;Sitting Upper Body Bathing Details (indicate cue type and reason): Frequent rest breaks secondary to SOB w/ activity. Lower Body Bathing: Set up;Supervison/ safety;Sitting/lateral leans;Sit to/from stand;Cueing for compensatory techniques Lower Body Bathing Details (indicate cue type and reason): Sitting in chair and sit to stand, rest breaks PRN  Upper Body Dressing : Set up;Modified independent;Sitting     Lower Body Dressing Details (indicate cue type and reason): Pt don/doffed socks w/o a/e sitting in chair, required rest break after performing secondary to SOB/fatigue. Should benefit from supplimental O2. Toilet Transfer: RW;Supervision/safety;Grab bars;Ambulation (4 wheeled walker)   Toileting- Clothing Manipulation and Hygiene: Sit to/from stand;Supervision/safety Toileting - Clothing Manipulation Details (indicate cue type and reason): Sit to stand from toilet using grab bars and 4 wheeled walker. Tub/ Shower Transfer:  (NT)   Functional mobility during ADLs: Rolling walker;Supervision/safety (4 wheeled walker, rest breaks PRN) General ADL Comments: Pt was educated in role of OT. He was standing up in room, heading into bathroom as OT was arriving. He was overall Mod I functional mobiility and transfers using 4 wheeled  walker however, benefits from supervision secondary to SOB, requires frequent rest breaks and h/o falls in the past. He should benefit from acute OT  and supplemental O2.     Vision  Wears glasses at all times, h/o macular degeneration. No changes from baseline.                   Perception     Praxis      Pertinent Vitals/Pain Pain Assessment: No/denies pain (Pt reports pain in legs after exertion/walking and states it goes away after sitting/resting ~4-5 min, no pain now.) Faces Pain Scale: No hurt     Hand Dominance Right   Extremity/Trunk Assessment Upper Extremity Assessment Upper Extremity Assessment: Generalized weakness   Lower Extremity Assessment Lower Extremity Assessment: Defer to PT evaluation;Generalized weakness       Communication Communication Communication: No difficulties   Cognition Arousal/Alertness: Awake/alert Behavior During Therapy: WFL for tasks assessed/performed Overall Cognitive Status: Within Functional Limits for tasks assessed                     General Comments       Exercises       Shoulder Instructions      Home Living Family/patient expects to be discharged to:: Assisted living                             Home Equipment: Walker - 4 wheels          Prior Functioning/Environment Level of Independence: Independent with assistive device(s)        Comments: Pt helps to care for his wife who has Alzheimer's Disease. Pt states that he is used to walking ~6 laps at the ALF.     OT Diagnosis: Generalized weakness;Other (comment) (SOB, decreaed activity tolerance)   OT Problem List: Decreased strength;Decreased activity tolerance;Impaired balance (sitting and/or standing);Decreased knowledge of use of DME or AE;Pain   OT Treatment/Interventions: Self-care/ADL training;Energy conservation;DME and/or AE instruction;Patient/family education;Therapeutic activities;Balance training    OT Goals(Current goals can be found in the care plan section) Acute Rehab OT Goals Patient Stated Goal: Go home to ALF Time For Goal Achievement: 02/01/14 Potential  to Achieve Goals: Good  OT Frequency: Min 2X/week   Barriers to D/C:            Co-evaluation              End of Session Equipment Utilized During Treatment: Rolling walker (4 wheeled walker) Nurse Communication: Other (comment) (Pt up in room heading to bathroom, loose stool.)  Activity Tolerance: Patient limited by fatigue;Other (comment) (SOB, requires frequent rest breaks) Patient left: in chair;with call bell/phone within reach;with chair alarm set   Time: [phone] OT Time Calculation (min): 26 min Charges:  OT General Charges $OT Visit: 1 Procedure OT Evaluation $Initial OT Evaluation Tier I: 1 Procedure OT Treatments $Self Care/Home Management : 8-22 mins G-Codes:    Josephine Igo Dixon, OTR/L 01/18/2014, 10:06 AM

## 2014-01-19 LAB — BASIC METABOLIC PANEL
Anion gap: 8 (ref 5–15)
BUN: 14 mg/dL (ref 6–23)
CO2: 26 mmol/L (ref 19–32)
Calcium: 9.3 mg/dL (ref 8.4–10.5)
Chloride: 106 mEq/L (ref 96–112)
Creatinine, Ser: 1.37 mg/dL — ABNORMAL HIGH (ref 0.50–1.35)
GFR calc Af Amer: 51 mL/min — ABNORMAL LOW (ref >90)
GFR calc non Af Amer: 44 mL/min — ABNORMAL LOW (ref >90)
Glucose, Bld: 179 mg/dL — ABNORMAL HIGH (ref 70–99)
Potassium: 4.2 mmol/L (ref 3.5–5.1)
Sodium: 140 mmol/L (ref 135–145)

## 2014-01-19 LAB — GLUCOSE, CAPILLARY: Glucose-Capillary: 176 mg/dL — ABNORMAL HIGH (ref 70–99)

## 2014-01-19 NOTE — Progress Notes (Addendum)
Pt O2 93/94% when in bed on RA. Pt O2 drop to 88 on RA when sitting on side of bed after returning from bathroom. Pt states he will not wear oxygen in bed. Pt educated to use oxygen when up ambulating in room due to oxygen level decreasing with ambulation.

## 2014-01-19 NOTE — Clinical Social Work Psychosocial (Signed)
     Clinical Social Work Department BRIEF PSYCHOSOCIAL ASSESSMENT 01/19/2014  Patient:  Evan [name], Evan [name]     Account Number:  [id number]     Admit date:  01/16/2014  Clinical Social Worker:  Elam Dutch  Date/Time:  01/19/2014 03:25 PM  Referred by:  Physician  Date Referred:  01/18/2014 Referred for  Other - See comment   Other Referral:   From ALF   Interview type:  Other - See comment Other interview type:   Patient and daughter Evan [name]    PSYCHOSOCIAL DATA Living Status:  FACILITY Admitted from facility:  [facility] Level of care:  Assisted Living Primary support name:  Staff Rude  [phone] Primary support relationship to patient:  CHILD, ADULT Degree of support available:   Strong support    CURRENT CONCERNS Current Concerns  Post-Acute Placement   Other Concerns:   Return to [place] / PLAN 81 year old male- admitted from [place]. Patient was assessed by Physical Therapy who determined that he would be safe to return to facility at d/c. He may require 02 at d/c and is already served by [facility].  Patient and daughter request return to facility when medically stable.  Fl2 placed on chart for MD's signature.   Assessment/plan status:  Psychosocial Support/Ongoing Assessment of Needs Other assessment/ plan:   Information/referral to community resources:   None at this time.  RNCM to arrange Home Health and possible DME    PATIENTS/FAMILYS RESPONSE TO PLAN OF CARE: Patient is alert and very pleasant. He states that he really enjoys staying at [facility] and facililty staff state that he keeps in contact with them while in the hospital  CSW spoke to [place] - staff from [facility] yesterday. They feel that they will be able to manage patient back at facility when medically stable.

## 2014-01-19 NOTE — Progress Notes (Signed)
Inpatient Diabetes Program Recommendations  AACE/ADA: New Consensus Statement on Inpatient Glycemic Control (2013)  Target Ranges:  Prepandial:   less than 140 mg/dL      Peak postprandial:   less than 180 mg/dL (1-2 hours)      Critically ill patients:  140 - 180 mg/dL   Inpatient Diabetes Program Recommendations Correction (SSI): add Novolog sensitive scale TID per Glycemic Control order-set Thank you  Raoul Pitch BSN, RN,CDE Inpatient Diabetes Coordinator [phone] (team pager)

## 2014-01-19 NOTE — Progress Notes (Signed)
Patient Name: Evan [name] Date of Encounter: 01/19/2014  Dr. Schuyler Amor. Allred   Principal Problem:   Acute on chronic systolic and diastolic heart failure, NYHA class 4 Active Problems:   Diabetes   HYPERCHOLESTEROLEMIA   Essential hypertension   CABG '96   GERD   CARDIAC MURMUR   PAF (paroxysmal atrial fibrillation)   Tachy-brady syndrome   CAD S/P SVG-MO1/OM2 DES 12/11/13   Pacemaker implanted 12/12/13 (MDT)   HOH (hard of hearing)    SUBJECTIVE  Denies any CP, SOB improving. Frustrated that he did not sleep last night with PM lasix.   CURRENT MEDS . amiodarone  200 mg Oral Daily  . amLODipine  5 mg Oral Daily  . aspirin EC  81 mg Oral Daily  . atorvastatin  10 mg Oral q1800  . carvedilol  12.5 mg Oral BID WC  . clopidogrel  75 mg Oral Daily  . finasteride  5 mg Oral Daily  . furosemide  80 mg Intravenous BID  . heparin  5,000 Units Subcutaneous 3 times per day  . hydrALAZINE  75 mg Oral 3 times per day  . isosorbide mononitrate  30 mg Oral Daily  . linagliptin  5 mg Oral Daily  . pantoprazole  40 mg Oral Daily  . potassium chloride  40 mEq Oral BID  . sodium chloride  3 mL Intravenous Q12H    OBJECTIVE  Filed Vitals:   01/18/14 1557 01/18/14 2011 01/18/14 2119 01/19/14 0610  BP:  142/51 151/63 156/52  Pulse: 75 59  62  Temp:  98.5 F (36.9 C)  97.9 F (36.6 C)  TempSrc:  Oral  Oral  Resp:  18    Height:      Weight:    163 lb 12.8 oz (74.299 kg)  SpO2:  93%  92%    Intake/Output Summary (Last 24 hours) at 01/19/14 0919 Last data filed at 01/19/14 6962  Gross per 24 hour  Intake   1258 ml  Output   2475 ml  Net  -1217 ml   Filed Weights   01/17/14 0600 01/18/14 0458 01/19/14 0610  Weight: 169 lb 6.4 oz (76.839 kg) 167 lb 14.4 oz (76.159 kg) 163 lb 12.8 oz (74.299 kg)    PHYSICAL EXAM  General: Pleasant, NAD. Neuro: Alert and oriented X 3. Moves all extremities spontaneously. Psych: Normal affect. HEENT:  Normal  Neck: Supple  without bruits or JVD. Lungs:  Resp regular and unlabored. Mild bibasilar rale. Heart: RRR no s3, s4, or murmurs. Abdomen: Soft, non-tender, non-distended, BS + x 4.  Extremities: No clubbing, cyanosis. DP/PT/Radials 2+ and equal bilaterally. 1-2+ pitting edema in bilateral LE  Accessory Clinical Findings  CBC  Recent Labs  01/16/14 1147  WBC 7.2  NEUTROABS 6.0  HGB 10.9*  HCT 34.0*  MCV 86.7  PLT 952   Basic Metabolic Panel  Recent Labs  01/16/14 1147  01/18/14 0520 01/19/14 0536  NA 143  < > 143 140  K 4.0  < > 4.3 4.2  CL 109  < > 103 106  CO2 23  < > 24 26  GLUCOSE 181*  < > 189* 179*  BUN 19  < > 16 14  CREATININE 1.40*  < > 1.35 1.37*  CALCIUM 9.2  < > 9.4 9.3  MG 2.1  --   --   --   < > = values in this interval not displayed. Liver Function Tests  Recent Labs  01/16/14 1147  AST 20  ALT 19  ALKPHOS 75  BILITOT 1.1  PROT 6.0  ALBUMIN 3.6   Cardiac Enzymes  Recent Labs  01/16/14 1147 01/16/14 1715 01/16/14 2321  TROPONINI 0.07* 0.16* 0.22*   Thyroid Function Tests  Recent Labs  01/16/14 1147  TSH 7.254*    TELE Paced rhythm with HR 60    ECG  No new EKG  Echocardiogram 12/08/2013  LV EF: 45% -  50%  ------------------------------------------------------------------- Indications:   Aortic stenosis 424.1.  ------------------------------------------------------------------- History:  PMH: Bradycardia. Murmur. Heart block. Coronary artery disease. Risk factors: Hypertension. Diabetes mellitus. Dyslipidemia.  ------------------------------------------------------------------- Study Conclusions  - Left ventricle: The cavity size was normal. Wall thickness was normal. Systolic function was mildly reduced. The estimated ejection fraction was in the range of 45% to 50%. Diffuse hypokinesis. Doppler parameters are consistent with abnormal left ventricular relaxation (grade 1 diastolic dysfunction). - Aortic  valve: There was mild regurgitation. - Mitral valve: Calcified annulus. - Left atrium: The atrium was mildly dilated.    Radiology/Studies  Dg Chest 2 View  01/17/2014   CLINICAL DATA:  Shortness of breath, history of coronary artery disease, CABG. Pacemaker replacement in December 2015  EXAM: CHEST  2 VIEW  COMPARISON:  PA and lateral chest of December 13, 2013  FINDINGS: There are fluffy bilateral alveolar infiltrates. The lung volumes are low. The retrocardiac region is dense on the left. There are bilateral pleural effusions layering posteriorly. The cardiac silhouette is mildly enlarged. The permanent pacemaker is in appropriate position radiographically. There are post CABG changes.  IMPRESSION: Congestive heart failure with bilateral pulmonary interstitial and alveolar edema with bilateral pleural effusions.   Electronically Signed   By: David  Martinique   On: 01/17/2014 08:18    ASSESSMENT AND PLAN  1. Acute on chronic combined systolic and diastolic HF  - 553 lbs in Dec, admission weight 174 lbs and now 167.   - IV lasix increased to 80mg  BID yesterday, pt frustrated that he did not get any sleep last night, will continue IV lasix for 1 more day and change to once a day PO lasix (likely 80 mg qam)  - likely discharge in 24 - 48 hrs. Will need home O2.  2. CAD s/p recent DES to SVG to OM1/OM2 - continue ASA and plavix, coreg  3. Recent Medtronic dual chamber PPM for symptomatic 2nd degree AV block - pacemaker rate was 50, adjusted to 60 yesterday in office  - coreg increased during this hospitalization for BP control, however now patient has started pacing majority of the time, if drain on battery is too significant, may need to scale back coreg later.  4. AS  5. Leg pain: L LE moderate stenosis, R LE mild stenosis based on ABI, also evidence of elevated velocities in L distal popliteal artery of 50-99% stenosis  6. PAF: continue amiodarone  7. HTN: coreg  12.5 mg, amlodipine 5, hydralazine 50Q8. Increase to 75. BP still not well controlled.  8. Advanced age  81. Hypoxemia with ambulation 87%. Will need home O2.  10. Elevated troponin 0.22. Likely from CHF.  Hilbert Corrigan PA-C Pager: [phone]  Personally seen and examined. Agree with above. Continue diuresis Sorry about urination at night.  Hopeful PO tomorrow.  Candee Furbish, MD

## 2014-01-20 DIAGNOSIS — I10 Essential (primary) hypertension: Secondary | ICD-10-CM

## 2014-01-20 DIAGNOSIS — E1122 Type 2 diabetes mellitus with diabetic chronic kidney disease: Secondary | ICD-10-CM | POA: Diagnosis present

## 2014-01-20 DIAGNOSIS — N183 Chronic kidney disease, stage 3 (moderate): Secondary | ICD-10-CM

## 2014-01-20 DIAGNOSIS — E119 Type 2 diabetes mellitus without complications: Secondary | ICD-10-CM

## 2014-01-20 DIAGNOSIS — Z9861 Coronary angioplasty status: Secondary | ICD-10-CM

## 2014-01-20 DIAGNOSIS — Z9181 History of falling: Secondary | ICD-10-CM

## 2014-01-20 LAB — BASIC METABOLIC PANEL
Anion gap: 13 (ref 5–15)
BUN: 14 mg/dL (ref 6–23)
CO2: 24 mmol/L (ref 19–32)
Calcium: 9.1 mg/dL (ref 8.4–10.5)
Chloride: 104 mEq/L (ref 96–112)
Creatinine, Ser: 1.38 mg/dL — ABNORMAL HIGH (ref 0.50–1.35)
GFR calc Af Amer: 51 mL/min — ABNORMAL LOW (ref >90)
GFR calc non Af Amer: 44 mL/min — ABNORMAL LOW (ref >90)
Glucose, Bld: 163 mg/dL — ABNORMAL HIGH (ref 70–99)
Potassium: 3.7 mmol/L (ref 3.5–5.1)
Sodium: 141 mmol/L (ref 135–145)

## 2014-01-20 LAB — GLUCOSE, CAPILLARY: GLUCOSE-CAPILLARY: 158 mg/dL — AB (ref 70–99)

## 2014-01-20 MED ORDER — HYDRALAZINE HCL 25 MG PO TABS: 75.0000 mg | ORAL_TABLET | Freq: Three times a day (TID) | ORAL | Status: DC

## 2014-01-20 MED ORDER — FUROSEMIDE 80 MG PO TABS: 80.0000 mg | ORAL_TABLET | Freq: Every day | ORAL | Status: DC

## 2014-01-20 MED ORDER — AMLODIPINE BESYLATE 5 MG PO TABS: 5.0000 mg | ORAL_TABLET | Freq: Every day | ORAL | Status: DC

## 2014-01-20 MED ORDER — CARVEDILOL 12.5 MG PO TABS: 12.5000 mg | ORAL_TABLET | Freq: Two times a day (BID) | ORAL | Status: DC

## 2014-01-20 MED ORDER — ISOSORBIDE MONONITRATE ER 30 MG PO TB24: 30.0000 mg | ORAL_TABLET | Freq: Every day | ORAL | Status: DC

## 2014-01-20 NOTE — Discharge Summary (Signed)
Patient ID: Evan [name],  MRN: [id number], DOB/AGE: 08/04/1923 81 y.o.  Admit date: 01/16/2014 Discharge date: 01/20/2014  Primary Care Provider: Horatio Pel, MD Primary Cardiologist: Dr Johnsie Cancel Dr Allred  Discharge Diagnoses Principal Problem:   Acute on chronic systolic and diastolic heart failure, NYHA class 4 Active Problems:   Type 2 diabetes with nephropathy   CABG '96   PAF (paroxysmal atrial fibrillation)   Tachy-brady syndrome   CAD S/P SVG-MO1/OM2 DES 12/11/13   Pacemaker implanted 12/12/13 (MDT)   Patient had 2 or more falls in past year   Stage 3 chronic renal impairment associated with type 2 diabetes mellitus   HYPERCHOLESTEROLEMIA   Essential hypertension   HOH (hard of hearing)    Hospital Course:  Pt is an 81 year old male with known CAD - prior CABG back in 1996. Admitted in December of 2015 with syncope/bradycardia and chest pain - was cathed and had DES to the SVG to the OM. Cath was followed by PPM implant per Dr. Rayann Heman - dual chamber device. Has chronic weakness in the legs/atrophy and has had frequent falls. He is on chronic amiodarone for PAF. Other issues include aortic stenosis, DM, HLD, and HTN. Has some mild LV dysfunction per recent echo with grade 1 diastolic dysfunction and no AS. Pt was admitted for IV diuresis. Adm wgt 174, discharge wgt 156. He was discharged back to nursing facility on O2 (O2 sats 87 with ambulation). ARB was stopped secondary to renal insufficiency. Pacemaker rate was increased from 50 to 60. The pt has had chronic leg pain and weakness. Arterial dopplers done showed distal Lt poplitial disease of 50-99%-no intervention planned. Pt will be a TOC f/u pt in 7-10 days in the office.    Discharge Vitals:  Blood pressure 156/42, pulse 60, temperature 98.3 F (36.8 C), temperature source Oral, resp. rate 18, height 5\' 7"  (1.702 m), weight 156 lb 14.4 oz (71.169 kg), SpO2 94 %.    Labs: Results for orders placed or  performed during the hospital encounter of 01/16/14 (from the past 24 hour(s))  Basic metabolic panel     Status: Abnormal   Collection Time: 01/20/14  4:38 AM  Result Value Ref Range   Sodium 141 135 - 145 mmol/L   Potassium 3.7 3.5 - 5.1 mmol/L   Chloride 104 96 - 112 mEq/L   CO2 24 19 - 32 mmol/L   Glucose, Bld 163 (H) 70 - 99 mg/dL   BUN 14 6 - 23 mg/dL   Creatinine, Ser 1.38 (H) 0.50 - 1.35 mg/dL   Calcium 9.1 8.4 - 10.5 mg/dL   GFR calc non Af Amer 44 (L) >90 mL/min   GFR calc Af Amer 51 (L) >90 mL/min   Anion gap 13 5 - 15  Glucose, capillary     Status: Abnormal   Collection Time: 01/20/14  7:20 AM  Result Value Ref Range   Glucose-Capillary 158 (H) 70 - 99 mg/dL    Disposition:  Follow-up Information    Follow up with Jenkins Rouge, MD.   Specialty:  Cardiology   Why:  office will call you   Contact information:   [address] [address] [phone]       Discharge Medications:    Medication List    STOP taking these medications        trandolapril 2 MG tablet  Commonly known as:  [place] these  medications        acetaminophen 325 MG tablet  Commonly known as:  TYLENOL  Take 650 mg by mouth every 6 (six) hours as needed for mild pain.     amiodarone 200 MG tablet  Commonly known as:  PACERONE  Take 1 tablet (200 mg total) by mouth 2 (two) times daily.     amLODipine 5 MG tablet  Commonly known as:  NORVASC  Take 1 tablet (5 mg total) by mouth daily.     aspirin 81 MG chewable tablet  Chew 1 tablet (81 mg total) by mouth daily.     atorvastatin 10 MG tablet  Commonly known as:  LIPITOR  Take 1 tablet by mouth daily.     bismuth subsalicylate 734 LP/37TK suspension  Commonly known as:  PEPTO BISMOL  Take 30 mLs by mouth as directed.     carvedilol 12.5 MG tablet  Commonly known as:  COREG  Take 1 tablet (12.5 mg total) by mouth 2 (two) times daily with a meal.     clopidogrel 75 MG tablet  Commonly  known as:  PLAVIX  Take 1 tablet (75 mg total) by mouth daily with breakfast.     cyanocobalamin 100 MCG tablet  Take 100 mcg by mouth daily.     finasteride 5 MG tablet  Commonly known as:  PROSCAR  Take 5 mg by mouth daily.     furosemide 80 MG tablet  Commonly known as:  LASIX  Take 1 tablet (80 mg total) by mouth daily.     hydrALAZINE 25 MG tablet  Commonly known as:  APRESOLINE  Take 3 tablets (75 mg total) by mouth every 8 (eight) hours.     isosorbide mononitrate 30 MG 24 hr tablet  Commonly known as:  IMDUR  Take 1 tablet (30 mg total) by mouth daily.     lansoprazole 15 MG capsule  Commonly known as:  PREVACID  Take 15 mg by mouth daily.     NITROSTAT 0.4 MG SL tablet  Generic drug:  nitroGLYCERIN  PLACE 1 TABLET (0.4 MG TOTAL) UNDER THE TONGUE EVERY 5 (FIVE) MINUTES AS NEEDED FOR CHEST PAIN.     ONGLYZA 5 MG Tabs tablet  Generic drug:  saxagliptin HCl  Take 5 mg by mouth daily.     traMADol 50 MG tablet  Commonly known as:  ULTRAM  Take 1 tablet (50 mg total) by mouth every 6 (six) hours as needed.     vitamin C 500 MG tablet  Commonly known as:  ASCORBIC ACID  Take 500 mg by mouth daily.     zaleplon 10 MG capsule  Commonly known as:  SONATA  Take 10 mg by mouth at bedtime as needed for sleep.         Duration of Discharge Encounter: Greater than 30 minutes including physician time.  Angelena Form PA-C 01/20/2014 9:06 AM

## 2014-01-20 NOTE — Progress Notes (Signed)
Pt d/c to [facility] via ambulance.  Initially had refused to be transported as pt states after transport left "I was not ready yet. and just coming from the bathroom."

## 2014-01-20 NOTE — Progress Notes (Signed)
Patient Name: Evan [name] Date of Encounter: 01/20/2014  Dr. Schuyler Amor. Allred   Principal Problem:   Acute on chronic systolic and diastolic heart failure, NYHA class 4 Active Problems:   Diabetes   HYPERCHOLESTEROLEMIA   Essential hypertension   CABG '96   GERD   CARDIAC MURMUR   PAF (paroxysmal atrial fibrillation)   Tachy-brady syndrome   CAD S/P SVG-MO1/OM2 DES 12/11/13   Pacemaker implanted 12/12/13 (MDT)   HOH (hard of hearing)    SUBJECTIVE  Denies any CP, SOB improving. Frustrated that he did not sleep again last night with PM lasix. Ready to go.   CURRENT MEDS . amiodarone  200 mg Oral Daily  . amLODipine  5 mg Oral Daily  . aspirin EC  81 mg Oral Daily  . atorvastatin  10 mg Oral q1800  . carvedilol  12.5 mg Oral BID WC  . clopidogrel  75 mg Oral Daily  . finasteride  5 mg Oral Daily  . furosemide  80 mg Intravenous BID  . heparin  5,000 Units Subcutaneous 3 times per day  . hydrALAZINE  75 mg Oral 3 times per day  . isosorbide mononitrate  30 mg Oral Daily  . linagliptin  5 mg Oral Daily  . pantoprazole  40 mg Oral Daily  . sodium chloride  3 mL Intravenous Q12H    OBJECTIVE  Filed Vitals:   01/19/14 1656 01/19/14 1840 01/19/14 2031 01/20/14 0455  BP: 157/45 150/38 153/46 156/42  Pulse: 63 59 60 60  Temp: 98 F (36.7 C)  98.2 F (36.8 C) 98.3 F (36.8 C)  TempSrc: Oral  Oral Oral  Resp: 18  18 18   Height:      Weight:    156 lb 14.4 oz (71.169 kg)  SpO2: 94%  96% 94%    Intake/Output Summary (Last 24 hours) at 01/20/14 0755 Last data filed at 01/20/14 0430  Gross per 24 hour  Intake   1325 ml  Output   3875 ml  Net  -2550 ml   Filed Weights   01/18/14 0458 01/19/14 0610 01/20/14 0455  Weight: 167 lb 14.4 oz (76.159 kg) 163 lb 12.8 oz (74.299 kg) 156 lb 14.4 oz (71.169 kg)    PHYSICAL EXAM  General: Pleasant, NAD. Neuro: Alert and oriented X 3. Moves all extremities spontaneously. Psych: Normal affect. HEENT:   Normal  Neck: Supple without bruits or JVD. Lungs:  Resp regular and unlabored. Mild bibasilar rale. Heart: RRR no s3, s4, or murmurs. Abdomen: Soft, non-tender, non-distended, BS + x 4.  Extremities: No clubbing, cyanosis. DP/PT/Radials 2+ and equal bilaterally. 1-2+ pitting edema in bilateral LE  Accessory Clinical Findings  CBC No results for input(s): WBC, NEUTROABS, HGB, HCT, MCV, PLT in the last 72 hours. Basic Metabolic Panel  Recent Labs  01/19/14 0536 01/20/14 0438  NA 140 141  K 4.2 3.7  CL 106 104  CO2 26 24  GLUCOSE 179* 163*  BUN 14 14  CREATININE 1.37* 1.38*  CALCIUM 9.3 9.1  TELE Paced rhythm with HR 60    ECG  No new EKG  Echocardiogram 12/08/2013  LV EF: 45% -  50%  ------------------------------------------------------------------- Indications:   Aortic stenosis 424.1.  ------------------------------------------------------------------- History:  PMH: Bradycardia. Murmur. Heart block. Coronary artery disease. Risk factors: Hypertension. Diabetes mellitus. Dyslipidemia.  ------------------------------------------------------------------- Study Conclusions  - Left ventricle: The cavity size was normal. Wall thickness was normal. Systolic function was mildly reduced. The estimated ejection fraction was in the  range of 45% to 50%. Diffuse hypokinesis. Doppler parameters are consistent with abnormal left ventricular relaxation (grade 1 diastolic dysfunction). - Aortic valve: There was mild regurgitation. - Mitral valve: Calcified annulus. - Left atrium: The atrium was mildly dilated.    Radiology/Studies  Dg Chest 2 View  01/17/2014   CLINICAL DATA:  Shortness of breath, history of coronary artery disease, CABG. Pacemaker replacement in December 2015  EXAM: CHEST  2 VIEW  COMPARISON:  PA and lateral chest of December 13, 2013  FINDINGS: There are fluffy bilateral alveolar infiltrates. The lung volumes are low. The retrocardiac  region is dense on the left. There are bilateral pleural effusions layering posteriorly. The cardiac silhouette is mildly enlarged. The permanent pacemaker is in appropriate position radiographically. There are post CABG changes.  IMPRESSION: Congestive heart failure with bilateral pulmonary interstitial and alveolar edema with bilateral pleural effusions.   Electronically Signed   By: David  Martinique   On: 01/17/2014 08:18    ASSESSMENT AND PLAN  1. Acute on chronic combined systolic and diastolic HF  - 448 lbs in Dec, admission weight 174 lbs and now 156.   -  change to once a day PO lasix (likely 80 mg qam) from 60 at home  - Will need home O2.  2. CAD s/p recent DES to SVG to OM1/OM2 - continue ASA and plavix, coreg  3. Recent [employer] dual chamber PPM for symptomatic 2nd degree AV block - pacemaker rate was 50, adjusted to 60 yesterday in office  - coreg increased during this hospitalization for BP control, however now patient has started pacing majority of the time, if drain on battery is too significant, may need to scale back coreg later.  4. AS  5. Leg pain: L LE moderate stenosis, R LE mild stenosis based on ABI, also evidence of elevated velocities in L distal popliteal artery of 50-99% stenosis. Plavix  6. PAF: continue amiodarone  7. HTN: coreg 12.5 mg, amlodipine 5, hydralazine 50Q8. Increase to 75. BP still not well controlled.  8. Advanced age  81. Hypoxemia with ambulation 87%. Will need home O2.  10. Elevated troponin 0.22. Likely from CHF.   DC home Signed, Candee Furbish MD

## 2014-01-20 NOTE — Clinical Social Work Note (Signed)
Clinical Social Worker facilitated patient discharge including contacting patient family and facility to confirm patient discharge plans.  Clinical information faxed to facility and family agreeable with plan.  CSW arranged ambulance transport via [facility] to [employer] [facility] at [facility].  RN to call report prior to discharge.  Clinical Social Worker will sign off for now as social work intervention is no longer needed. Please consult [place] again if new need arises.  Glendon Axe, MSW, LCSWA [phone] 01/20/2014 9:51 AM

## 2014-01-22 ENCOUNTER — Telehealth: Payer: Self-pay | Admitting: Internal Medicine

## 2014-01-22 ENCOUNTER — Telehealth: Payer: Self-pay | Admitting: Nurse Practitioner

## 2014-01-22 NOTE — Telephone Encounter (Signed)
We'll see what labs look like on Thursday for now

## 2014-01-22 NOTE — Telephone Encounter (Signed)
Melissa from [facility] calls regarding an order for a bmp on pt.  Melissa states pt's Heart Failure home health orders stated pt needed weekly bmps. This was not on the hospital discharge sheet.  Clarification is needed whether pt needs weekly bmps drawn or not.  They will draw labs on Thursday. Message forwarded to [name] & Dr. Rayann Heman.  Send message back to triage so we can call [facility]  Horton Chin RN

## 2014-01-22 NOTE — Telephone Encounter (Signed)
[facility] calling re pt needing weekly BMP's per hospital d/c, no orders in system, does he need these ? If so if they can get orders pt can have labs there, they have someone come out on Thurdays to draw labs

## 2014-01-22 NOTE — Telephone Encounter (Signed)
Patient contacted regarding discharge from [facility] on 01/20/14.  Patient understands to follow up with provider Kathrene Alu  on 01/30/14  at 2 PM  at [facility], [place]. Patient understands discharge instructions? Yes, pt states he is doing fine. Patient understands medications and regiment? Pt states he is a resident of [facility] and they are in charge of his medications.  Patient understands to bring all medications to this visit? Pt states he will ask [facility] to send a copy of his current medication list with him to his office visit 01/30/14.

## 2014-01-22 NOTE — Telephone Encounter (Signed)
New problem   Pt has TCM w/Lori 2.26.18 @ 2pm per after hr voice mail..Also per after hrs .... He will need a phone call in 48hrs from an RN. Per Kerin Ransom PA-C  01/20/2014

## 2014-01-23 ENCOUNTER — Telehealth: Payer: Self-pay | Admitting: Cardiovascular Disease

## 2014-01-23 ENCOUNTER — Other Ambulatory Visit: Payer: Self-pay | Admitting: Internal Medicine

## 2014-01-23 ENCOUNTER — Encounter: Payer: Self-pay | Admitting: Internal Medicine

## 2014-01-23 DIAGNOSIS — R1084 Generalized abdominal pain: Secondary | ICD-10-CM

## 2014-01-23 NOTE — Telephone Encounter (Signed)
Can give order for cardiopulm assessment

## 2014-01-23 NOTE — Telephone Encounter (Signed)
New Message      Evan [name] needs an order to [name] out for cardio pulmonary assessment.  Please call representative Thanks

## 2014-01-23 NOTE — Telephone Encounter (Signed)
Called [place] from [place] back. Evan [name] was seeing patient prior to hospital admission for PT/OT. Since patient has a new diagnosis of CHF, they need an order to have cardio pulmonary assessment with their home visits. Informed Whitman Hero that I would forward to [name] for order.

## 2014-01-24 ENCOUNTER — Other Ambulatory Visit: Payer: Self-pay | Admitting: Internal Medicine

## 2014-01-24 DIAGNOSIS — R1084 Generalized abdominal pain: Secondary | ICD-10-CM

## 2014-01-24 DIAGNOSIS — I739 Peripheral vascular disease, unspecified: Secondary | ICD-10-CM

## 2014-01-24 NOTE — Telephone Encounter (Signed)
Called [place] form [place] and informed her that [name] would give the order for cardiopulm assessment.

## 2014-01-25 ENCOUNTER — Encounter: Payer: Self-pay | Admitting: Cardiovascular Disease

## 2014-01-25 NOTE — Telephone Encounter (Signed)
[facility]    NOTIFIED   WILL  AWAIT  RESULTS  FROM BMET   ON Thursday  TO  DECIDE   WHEN  NEEDS T O BE  CHECKED  AGAIN .Adonis Housekeeper

## 2014-01-30 ENCOUNTER — Encounter: Payer: Self-pay | Admitting: Nurse Practitioner

## 2014-01-30 ENCOUNTER — Ambulatory Visit (INDEPENDENT_AMBULATORY_CARE_PROVIDER_SITE_OTHER): Payer: Medicare Other | Admitting: Nurse Practitioner

## 2014-01-30 ENCOUNTER — Ambulatory Visit (INDEPENDENT_AMBULATORY_CARE_PROVIDER_SITE_OTHER): Payer: Medicare Other | Admitting: *Deleted

## 2014-01-30 VITALS — BP 140/60 | HR 72 | Ht 67.0 in | Wt 157.1 lb

## 2014-01-30 DIAGNOSIS — I5022 Chronic systolic (congestive) heart failure: Secondary | ICD-10-CM

## 2014-01-30 DIAGNOSIS — I495 Sick sinus syndrome: Secondary | ICD-10-CM

## 2014-01-30 LAB — MDC_IDC_ENUM_SESS_TYPE_INCLINIC
Battery Impedance: 100 Ohm
Battery Remaining Longevity: 111 mo
Battery Voltage: 2.79 V
Brady Statistic AP VS Percent: 21 %
Brady Statistic AS VS Percent: 1 %
Date Time Interrogation Session: 20160126165835
Lead Channel Impedance Value: 532 Ohm
Lead Channel Impedance Value: 669 Ohm
Lead Channel Setting Pacing Amplitude: 3.5 V
Lead Channel Setting Pacing Amplitude: 3.5 V
Lead Channel Setting Pacing Pulse Width: 0.4 ms
MDC IDC SET LEADCHNL RV SENSING SENSITIVITY: 4 mV
MDC IDC STAT BRADY AP VP PERCENT: 77 %
MDC IDC STAT BRADY AS VP PERCENT: 0 %

## 2014-01-30 LAB — CBC
HCT: 36.4 % — ABNORMAL LOW (ref 39.0–52.0)
Hemoglobin: 12 g/dL — ABNORMAL LOW (ref 13.0–17.0)
MCHC: 33 g/dL (ref 30.0–36.0)
MCV: 84.4 fl (ref 78.0–100.0)
Platelets: 179 10*3/uL (ref 150.0–400.0)
RBC: 4.31 Mil/uL (ref 4.22–5.81)
RDW: 18.1 % — ABNORMAL HIGH (ref 11.5–15.5)
WBC: 4.9 10*3/uL (ref 4.0–10.5)

## 2014-01-30 LAB — TSH: TSH: 11.46 u[IU]/mL — ABNORMAL HIGH (ref 0.35–4.50)

## 2014-01-30 MED ORDER — AMIODARONE HCL 200 MG PO TABS: 200.0000 mg | ORAL_TABLET | Freq: Every day | ORAL | Status: DC

## 2014-01-30 NOTE — Patient Instructions (Addendum)
We will be checking the following labs today BMET, CBC & TSH  Stay on your current medicines but I am cutting the amiodarone back to just one pill a day  We will get your pacemaker checked today  I will see you in 3 months  Call the [place] office at [phone] if you have any questions, problems or concerns.

## 2014-01-30 NOTE — Progress Notes (Signed)
Pacemaker check w/Lori Gerhardt,NP (N/C). RR was turned on at nominal settings. No further changes or testing performed this ov. No episodes recorded. Patient to F/U w/JA as scheduled.

## 2014-01-30 NOTE — Progress Notes (Signed)
CARDIOLOGY OFFICE NOTE  Date:  01/30/2014    Evan [name] Date of Birth: [birth year] Medical Record #[id number]  PCP:  Horatio Pel, MD  Cardiologist:  Kristeen Mans  Chief Complaint  Patient presents with  . Congestive Heart Failure    Post hospital visit/TOC - seen for [name] & Allred.      History of Present Illness: Evan [name] is a 81 y.o. male who presents today for a TOC/post hospital visit. Seen for [name] and Dr. Rayann Heman. He is an 81 year old male with known CAD - prior CABG back in 1996. Admitted in December of 2015 with syncope/bradycardia and chest pain - was cathed and had DES to the SVG to the OM - had 5/5 patent bypass grafts. On DAPT for one year with Plavix and aspirin. Cath was followed by PPM implant per Dr. Rayann Heman - dual chamber device. Has chronic weakness in the legs/atrophy and has had frequent falls. He is on chronic amiodarone for PAF. Other issues include aortic stenosis, DM, HLD, and HTN. Has some mild LV dysfunction per recent echo with grade 1 diastolic dysfunction.   He was last seen here right after Christmas to see [name] and seemed to be doing ok - continuing to have chronic weakness of his legs.   I saw him about 2 weeks ago in the flex clinic - concern for low HR - pacer set at 50. Evidence of volume overload. He was admitted for diuresis. He is a DNR.  He was discharged back to nursing facility on O2 (O2 sats 87 with ambulation). ARB was stopped secondary to renal insufficiency. Pacemaker rate was increased from 50 to 60 at the time of his admission. The pt has had chronic leg pain and weakness. Arterial dopplers done showed distal Lt poplitial disease of 50-99%-no intervention planned.   Comes in today. Here alone. Using a walker. Looks much better. Saw Dr. Shelia Media this morning. Comes without any paperwork. Had labs last week which are scanned in and I have reviewed. Renal function now ok. He feels pretty good. No chest  pain. No falls. Breathing has improved. Swelling almost gone. Weight is down. Restricting his salt. Tells me that his leg pain is all gone and that the lump he had in his stomach is all gone too. He is quite happy with how he is doing.  Past Medical History  Diagnosis Date  . HYPERTENSION   . HYPERCHOLESTEROLEMIA   . CAD   . TRANSIENT ISCHEMIC ATTACKS, HX OF   . NEPHROLITHIASIS, HX OF   . GERD   . Trifascicular block     a. s/p MDT ADDRL1 pacemaker Dr Rayann Heman  . Syncope   . ALLERGIC RHINITIS   . Paroxysmal atrial fibrillation   . Atrial tachycardia   . CHF (congestive heart failure)   . Anginal pain   . DM     type 2  . Arthritis   . Presence of permanent cardiac pacemaker 12/12/2013    Past Surgical History  Procedure Laterality Date  . Ptca  1980's  . Coronary artery bypass graft  1996  . Back surgery  2000'S  . Av fistula repair    . 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 Coronary stent placement  12/11/13    SVG-OM DES  . Left heart catheterization with coronary/graft angiogram N/A 12/11/2013    Procedure: LEFT HEART CATHETERIZATION WITH CORONARY/GRAFT ANGIOGRAM;  Surgeon:  Burnell Blanks, MD;  Location: [facility] CATH LAB;  Service: Cardiovascular;  Laterality: N/A;  . Cardiac catheterization  12/11/2013    Procedure: CORONARY STENT INTERVENTION;  Surgeon: Burnell Blanks, MD;  Location: [facility] CATH LAB;  Service: Cardiovascular;;  DES 3.5 x 15 Resolute to seq SVG of OM1/OM2   . Permanent pacemaker insertion N/A 12/12/2013    MDT ADDRL1 pacemaker implnated by Dr Rayann Heman  . Insert / replace / remove pacemaker       Medications: Current Outpatient Prescriptions  Medication Sig Dispense Refill  . acetaminophen (TYLENOL) 325 MG tablet Take 650 mg by mouth every 6 (six) hours as needed for mild pain.     Marland Kitchen amiodarone (PACERONE) 200 MG tablet Take 1 tablet (200 mg total) by mouth daily. 30 tablet 6  . amLODipine (NORVASC) 5 MG tablet Take 1  tablet (5 mg total) by mouth daily. 30 tablet 11  . aspirin 81 MG chewable tablet Chew 1 tablet (81 mg total) by mouth daily.    Marland Kitchen atorvastatin (LIPITOR) 10 MG tablet Take 1 tablet by mouth daily.  0  . bismuth subsalicylate (PEPTO BISMOL) 262 MG/15ML suspension Take 30 mLs by mouth as directed.    . carvedilol (COREG) 12.5 MG tablet Take 1 tablet (12.5 mg total) by mouth 2 (two) times daily with a meal. 60 tablet 11  . clopidogrel (PLAVIX) 75 MG tablet Take 1 tablet (75 mg total) by mouth daily with breakfast. 30 tablet 11  . cyanocobalamin 100 MCG tablet Take 100 mcg by mouth daily.    . finasteride (PROSCAR) 5 MG tablet Take 5 mg by mouth daily.    . furosemide (LASIX) 80 MG tablet Take 1 tablet (80 mg total) by mouth daily. 30 tablet 11  . hydrALAZINE (APRESOLINE) 25 MG tablet Take 3 tablets (75 mg total) by mouth every 8 (eight) hours. 100 tablet 11  . isosorbide mononitrate (IMDUR) 30 MG 24 hr tablet Take 1 tablet (30 mg total) by mouth daily. 30 tablet 11  . lansoprazole (PREVACID) 15 MG capsule Take 15 mg by mouth daily.      Marland Kitchen NITROSTAT 0.4 MG SL tablet PLACE 1 TABLET (0.4 MG TOTAL) UNDER THE TONGUE EVERY 5 (FIVE) MINUTES AS NEEDED FOR CHEST PAIN. 25 tablet 1  . saxagliptin HCl (ONGLYZA) 5 MG TABS tablet Take 5 mg by mouth daily.     . traMADol (ULTRAM) 50 MG tablet Take 1 tablet (50 mg total) by mouth every 6 (six) hours as needed. (Patient taking differently: Take 50 mg by mouth every 6 (six) hours as needed for moderate pain. ) 10 tablet 0  . vitamin C (ASCORBIC ACID) 500 MG tablet Take 500 mg by mouth daily.      . zaleplon (SONATA) 10 MG capsule Take 10 mg by mouth at bedtime as needed for sleep.     No current facility-administered medications for this visit.    Allergies: Allergies  Allergen Reactions  . Demerol     Listed on MAR - reaction unknown  . Meperidine Hcl     REACTION: Hypotension    Social History: The patient  reports that he quit smoking about 58 years  ago. He has never used smokeless tobacco. He reports that he does not drink alcohol or use illicit drugs.   Family History: The patient's family history includes Coronary artery disease in his father; Diabetes in his mother.   Review of Systems: Please see the history of present illness.   Otherwise,  the review of systems is positive for excessive fatigue .   All other systems are reviewed and negative.   Physical Exam: VS:  BP 140/60 mmHg  Pulse 72  Ht 5\' 7"  (1.702 m)  Wt 157 lb 1.9 oz (71.269 kg)  BMI 24.60 kg/m2 .  BMI Body mass index is 24.6 kg/(m^2). General: Pleasant. Well developed, well nourished and in no acute distress.  HEENT: Normal but has poor dentition with missing teeth. Neck: Supple, no JVD, carotid bruits, or masses noted.  Cardiac: Regular rate and rhythm. Outflow murmur noted. Just trace edema.  Respiratory:  Lungs are clear to auscultation bilaterally with normal work of breathing.  GI: Soft and nontender.  MS: No deformity or atrophy. Gait and ROM intact.Using a walker today.  Skin: Warm and dry. Color is normal.  Neuro:  Strength and sensation are intact and no gross focal deficits noted.  Psych: Alert, appropriate and with normal affect.   Wt Readings from Last 3 Encounters:  01/30/14 157 lb 1.9 oz (71.269 kg)  01/20/14 156 lb 14.4 oz (71.169 kg)  01/16/14 174 lb 12.8 oz (79.289 kg)    LABORATORY DATA:  EKG:  EKG is not ordered today.   Lab Results  Component Value Date   WBC 7.2 01/16/2014   HGB 10.9* 01/16/2014   HCT 34.0* 01/16/2014   PLT 284 01/16/2014   GLUCOSE 163* 01/20/2014   ALT 19 01/16/2014   AST 20 01/16/2014   NA 141 01/20/2014   K 3.7 01/20/2014   CL 104 01/20/2014   CREATININE 1.38* 01/20/2014   BUN 14 01/20/2014   CO2 24 01/20/2014   TSH 7.254* 01/16/2014   INR 1.28 01/16/2014    BNP (last 3 results) No results for input(s): PROBNP in the last 8760 hours.  Other Studies Reviewed Today: Echo Study Conclusions from  12/2013  - Left ventricle: The cavity size was normal. Wall thickness was normal. Systolic function was mildly reduced. The estimated ejection fraction was in the range of 45% to 50%. Diffuse hypokinesis. Doppler parameters are consistent with abnormal left ventricular relaxation (grade 1 diastolic dysfunction). - Aortic valve: There was mild regurgitation. - Mitral valve: Calcified annulus. - Left atrium: The atrium was mildly dilated.   Angiographic Findings from 12/2013:  Left main: 30% distal stenosis.   Left Anterior Descending Artery: 100% proximal occlusion. Mid and distal LAD fills from the patent vein graft. The diagonal fills from the patent vein graft.   Circumflex Artery: 100% proximal occlusion. The first and second OM branches fill from the patent vein graft.   Right Coronary Artery: Large dominant vessel with diffuse 99% stenosis in the proximal, mid and distal vessel. The PDA and PLA fill from the patent vein graft.   Graft Anatomy:  SVG to PDA is patent SVG to OM1/OM2 is patent with 95% stenosis mid body of SVG SVG to LAD is patent SVG to Diagonal is patent  Left Ventricular Angiogram: Deferred.   Impression: 1. Severe triple vessel disease s/p 5 vessel CABG with 5/5 patent bypass grafts. 2. Severe stenosis mid body of SVG to OM 3. Unstable angina 4. Successful PTCA/DES x 1 mid body of SVG to OM1/OM2  Recommendations: Continue ASA and Plavix for one year.        Assessment / Plan: 1.Chronic combined systolic and diastolic HF - with recent exacerbation for acute heart failure. Looks much better today. Much more compensated. Will recheck his labs. See again in 3 months.  2. Recent PCI/DES  to SVG to OM1/OM2 - No active chest pain - continue DAPT  3. Recent PPM placement - I increased his rate at his last visit to 60. Device will be checked today.  4. Advanced age  44. AS - with recent echo noted - but with AI noted.   6. Leg  pain - doppler as noted. Medical therapy. His symptoms have resolved with diuresis.  7. PAF - will cut the amiodarone back to just one a day. TSH was up a little - will recheck and will need to follow.  8. Anemia - recheck CBC today. No active bleeding noted.  See back in 3 months.  Current medicines are reviewed with the patient today.  The patient does not have concerns regarding medicines.  The following changes have been made:  See above  Labs/ tests ordered today include:    Orders Placed This Encounter  Procedures  . Basic metabolic panel  . CBC  . TSH    Disposition:   FU with me in 3 months.   Patient is agreeable to this plan and will call if any problems develop in the interim.   Signed: Burtis Junes, RN, ANP-C 01/30/2014 2:36 PM  [facility] [address] [place], [place]  [address] Phone: [phone] Fax: [phone]

## 2014-01-31 ENCOUNTER — Telehealth: Payer: Self-pay | Admitting: Cardiovascular Disease

## 2014-01-31 LAB — BASIC METABOLIC PANEL
BUN: 16 mg/dL (ref 6–23)
CO2: 27 mEq/L (ref 19–32)
Calcium: 8.9 mg/dL (ref 8.4–10.5)
Chloride: 101 mEq/L (ref 96–112)
Creatinine, Ser: 1.19 mg/dL (ref 0.40–1.50)
GFR: 61.26 mL/min (ref >60.00)
Glucose, Bld: 183 mg/dL — ABNORMAL HIGH (ref 70–99)
Potassium: 3.5 mEq/L (ref 3.5–5.1)
Sodium: 138 mEq/L (ref 135–145)

## 2014-01-31 MED ORDER — LEVOTHYROXINE SODIUM 25 MCG PO TABS: 25.0000 ug | ORAL_TABLET | Freq: Every day | ORAL | Status: DC

## 2014-01-31 NOTE — Telephone Encounter (Signed)
SPOKE  [place]  PER  DR NISHAN  PT  NEEDS TO  F/U WITH PMD  NO CHANGES TO MADE AT THIS TIME  WITH    MEDS    .Adonis Housekeeper

## 2014-01-31 NOTE — Telephone Encounter (Signed)
New message      Pt c/o of Chest Pain: STAT if CP now or developed within 24 hours  1. Are you having CP right now? no  2. Are you experiencing any other symptoms (ex. SOB, nausea, vomiting, sweating)? no  3. How long have you been experiencing CP? 3 occurances since last night associated with actvity  4. Is your CP continuous or coming and going?  Coming and going  5. Have you taken Nitroglycerin? Took 1 nitro  Home health visit pt at 3:30pm today---pt lives in an assisted living facility?

## 2014-01-31 NOTE — Addendum Note (Signed)
Addended by: Newt Minion on: 01/31/2014 06:03 PM   Modules accepted: Orders

## 2014-02-02 ENCOUNTER — Telehealth: Payer: Self-pay

## 2014-02-02 NOTE — Telephone Encounter (Signed)
-----   Message from Burtis Junes, NP sent at 01/31/2014  9:24 AM EST ----- Ok to report. Labs are satisfactory but his thyroid is going to need treatment. This is most likely due to the amiodarone which was cut back at his OV. Start Synthroid 25 mcg daily Check TSH in 6 weeks. May have to send a script to [place] where he resides.

## 2014-02-02 NOTE — Telephone Encounter (Signed)
Spoke with [place] at [facility] regarding medication addition for pt.  She is aware that Rx has been sent and requested a copy of order be faxed to facility to her attention. Fax # [phone] Fax sent 02/02/2014

## 2014-02-06 ENCOUNTER — Encounter: Payer: Self-pay | Admitting: Internal Medicine

## 2014-02-07 ENCOUNTER — Encounter: Payer: Self-pay | Admitting: Internal Medicine

## 2014-03-05 ENCOUNTER — Other Ambulatory Visit: Payer: Self-pay

## 2014-03-05 ENCOUNTER — Other Ambulatory Visit (INDEPENDENT_AMBULATORY_CARE_PROVIDER_SITE_OTHER): Payer: Medicare Other | Admitting: *Deleted

## 2014-03-05 DIAGNOSIS — R5383 Other fatigue: Secondary | ICD-10-CM

## 2014-03-05 DIAGNOSIS — I5022 Chronic systolic (congestive) heart failure: Secondary | ICD-10-CM

## 2014-03-05 LAB — TSH: TSH: 8.4 u[IU]/mL — ABNORMAL HIGH (ref 0.35–4.50)

## 2014-03-14 ENCOUNTER — Ambulatory Visit (INDEPENDENT_AMBULATORY_CARE_PROVIDER_SITE_OTHER): Payer: Medicare Other | Admitting: Internal Medicine

## 2014-03-14 ENCOUNTER — Encounter: Payer: Self-pay | Admitting: Internal Medicine

## 2014-03-14 ENCOUNTER — Other Ambulatory Visit: Payer: Self-pay

## 2014-03-14 VITALS — BP 122/84 | HR 64 | Ht 67.0 in | Wt 155.8 lb

## 2014-03-14 DIAGNOSIS — I459 Conduction disorder, unspecified: Secondary | ICD-10-CM

## 2014-03-14 DIAGNOSIS — I10 Essential (primary) hypertension: Secondary | ICD-10-CM

## 2014-03-14 DIAGNOSIS — I442 Atrioventricular block, complete: Secondary | ICD-10-CM | POA: Diagnosis not present

## 2014-03-14 DIAGNOSIS — I495 Sick sinus syndrome: Secondary | ICD-10-CM | POA: Diagnosis not present

## 2014-03-14 DIAGNOSIS — I480 Paroxysmal atrial fibrillation: Secondary | ICD-10-CM

## 2014-03-14 DIAGNOSIS — I441 Atrioventricular block, second degree: Secondary | ICD-10-CM | POA: Diagnosis not present

## 2014-03-14 DIAGNOSIS — I25810 Atherosclerosis of coronary artery bypass graft(s) without angina pectoris: Secondary | ICD-10-CM | POA: Diagnosis not present

## 2014-03-14 LAB — MDC_IDC_ENUM_SESS_TYPE_INCLINIC
Battery Impedance: 100 Ohm
Battery Voltage: 2.78 V
Brady Statistic AP VP Percent: 99 %
Date Time Interrogation Session: 20160309124726
Lead Channel Pacing Threshold Amplitude: 0.75 V
Lead Channel Pacing Threshold Pulse Width: 0.4 ms
Lead Channel Pacing Threshold Pulse Width: 0.76 ms
Lead Channel Sensing Intrinsic Amplitude: 22 mV
Lead Channel Setting Pacing Amplitude: 2 V
Lead Channel Setting Pacing Amplitude: 2.5 V
Lead Channel Setting Pacing Pulse Width: 0.4 ms
Lead Channel Setting Sensing Sensitivity: 4 mV
MDC IDC MSMT BATTERY REMAINING LONGEVITY: 110 mo
MDC IDC MSMT LEADCHNL RA IMPEDANCE VALUE: 541 Ohm
MDC IDC MSMT LEADCHNL RA PACING THRESHOLD AMPLITUDE: 0.75 V
MDC IDC MSMT LEADCHNL RV IMPEDANCE VALUE: 682 Ohm
MDC IDC STAT BRADY AP VS PERCENT: 1 %
MDC IDC STAT BRADY AS VP PERCENT: 0 %
MDC IDC STAT BRADY AS VS PERCENT: 0 %

## 2014-03-14 NOTE — Patient Instructions (Signed)
Your physician wants you to follow-up in: 12 months with Dr Vallery Ridge will receive a reminder letter in the mail two months in advance. If you don't receive a letter, please call our office to schedule the follow-up appointment.  Remote monitoring is used to monitor your Pacemaker or ICD from home. This monitoring reduces the number of office visits required to check your device to one time per year. It allows [place] to keep an eye on the functioning of your device to ensure it is working properly. You are scheduled for a device check from home on 06/13/14. You may send your transmission at any time that day. If you have a wireless device, the transmission will be sent automatically. After your physician reviews your transmission, you will receive a postcard with your next transmission date.

## 2014-03-15 NOTE — Progress Notes (Signed)
Electrophysiology Office Note   Date:  03/15/2014   ID:  Evan [name], DOB [birth year], MRN [id number]  PCP:  Horatio Pel, MD  Cardiologist:  Dr Johnsie Cancel Primary Electrophysiologist: Thompson Grayer, MD    Chief Complaint  Patient presents with  . Follow-up    chest pain     History of Present Illness: Evan [name] is a 81 y.o. male who presents today for electrophysiology evaluation.   He has done reasonably well since his pacemaker was implanted.  Recently, rate response was turned on by device clinic staff.  He notes that since that time, he has had significant exertional chest pain.  He denies pain at rest.  His pain will resolve with resting.  Today, he denies symptoms of palpitations,  shortness of breath, orthopnea, PND, lower extremity edema, claudication, dizziness, presyncope, syncope, bleeding, or neurologic sequela. The patient is tolerating medications without difficulties and is otherwise without complaint today.    Past Medical History  Diagnosis Date  . HYPERTENSION   . HYPERCHOLESTEROLEMIA   . CAD   . TRANSIENT ISCHEMIC ATTACKS, HX OF   . NEPHROLITHIASIS, HX OF   . GERD   . Trifascicular block     a. s/p MDT ADDRL1 pacemaker Dr Rayann Heman  . Syncope   . ALLERGIC RHINITIS   . Paroxysmal atrial fibrillation   . Atrial tachycardia   . CHF (congestive heart failure)   . Anginal pain   . DM     type 2  . Arthritis   . Presence of permanent cardiac pacemaker 12/12/2013   Past Surgical History  Procedure Laterality Date  . Ptca  1980's  . Coronary artery bypass graft  1996  . Back surgery  2000'S  . Av fistula repair    . 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 Coronary stent placement  12/11/13    SVG-OM DES  . Left heart catheterization with coronary/graft angiogram N/A 12/11/2013    Procedure: LEFT HEART CATHETERIZATION WITH Beatrix Fetters;  Surgeon: Burnell Blanks, MD;  Location: [facility] CATH LAB;   Service: Cardiovascular;  Laterality: N/A;  . Cardiac catheterization  12/11/2013    Procedure: CORONARY STENT INTERVENTION;  Surgeon: Burnell Blanks, MD;  Location[facility] CATH LAB;  Service: Cardiovascular;;  DES 3.5 x 15 Resolute to seq SVG of OM1/OM2   . Permanent pacemaker insertion N/A 12/12/2013    MDT ADDRL1 pacemaker implnated by Dr Rayann Heman  . Insert / replace / remove pacemaker       Current Outpatient Prescriptions  Medication Sig Dispense Refill  . amiodarone (PACERONE) 200 MG tablet Take 1 tablet (200 mg total) by mouth daily. 30 tablet 6  . amLODipine (NORVASC) 5 MG tablet Take 1 tablet (5 mg total) by mouth daily. 30 tablet 11  . aspirin 81 MG chewable tablet Chew 1 tablet (81 mg total) by mouth daily.    Marland Kitchen atorvastatin (LIPITOR) 10 MG tablet Take 1 tablet by mouth daily.  0  . bismuth subsalicylate (PEPTO BISMOL) 262 MG/15ML suspension Take 30 mLs by mouth as directed.    . carvedilol (COREG) 12.5 MG tablet Take 1 tablet (12.5 mg total) by mouth 2 (two) times daily with a meal. 60 tablet 11  . clopidogrel (PLAVIX) 75 MG tablet Take 1 tablet (75 mg total) by mouth daily with breakfast. 30 tablet 11  . cyanocobalamin 100 MCG tablet Take 100 mcg by mouth daily.    . finasteride (PROSCAR) 5  MG tablet Take 5 mg by mouth daily.    . furosemide (LASIX) 80 MG tablet Take 1 tablet (80 mg total) by mouth daily. 30 tablet 11  . hydrALAZINE (APRESOLINE) 25 MG tablet Take 3 tablets (75 mg total) by mouth every 8 (eight) hours. 100 tablet 11  . isosorbide mononitrate (IMDUR) 30 MG 24 hr tablet Take 1 tablet (30 mg total) by mouth daily. 30 tablet 11  . lansoprazole (PREVACID) 15 MG capsule Take 15 mg by mouth daily.      Marland Kitchen levothyroxine (LEVOTHROID) 25 MCG tablet Take 1 tablet (25 mcg total) by mouth daily before breakfast. 90 tablet 3  . naproxen sodium (ANAPROX) 220 MG tablet Take 220 mg by mouth daily as needed (pain).    Marland Kitchen NITROSTAT 0.4 MG SL tablet PLACE 1 TABLET (0.4 MG TOTAL)  UNDER THE TONGUE EVERY 5 (FIVE) MINUTES AS NEEDED FOR CHEST PAIN. 25 tablet 1  . saxagliptin HCl (ONGLYZA) 5 MG TABS tablet Take 5 mg by mouth daily.     . traMADol (ULTRAM) 50 MG tablet Take 1 tablet (50 mg total) by mouth every 6 (six) hours as needed. (Patient taking differently: Take 50 mg by mouth every 6 (six) hours as needed for moderate pain. ) 10 tablet 0  . vitamin C (ASCORBIC ACID) 500 MG tablet Take 500 mg by mouth daily.      . zaleplon (SONATA) 10 MG capsule Take 10 mg by mouth at bedtime as needed for sleep.     No current facility-administered medications for this visit.    Allergies:   Demerol and Meperidine hcl   Social History:  The patient  reports that he quit smoking about 58 years ago. He has never used smokeless tobacco. He reports that he does not drink alcohol or use illicit drugs.   Family History:  The patient's family history includes Coronary artery disease in his father; Diabetes in his mother.    ROS:  Please see the history of present illness.   All other systems are reviewed and negative.    PHYSICAL EXAM: VS:  BP 122/84 mmHg  Pulse 64  Ht 5\' 7"  (1.702 m)  Wt 155 lb 12.8 oz (70.67 kg)  BMI 24.40 kg/m2 , BMI Body mass index is 24.4 kg/(m^2). GEN: eldely, in no acute distress HEENT: normal Neck: no JVD, carotid bruits, or masses Cardiac: RRR; no murmurs, rubs, or gallops,no edema  Respiratory:  clear to auscultation bilaterally, normal work of breathing GI: soft, nontender, nondistended, + BS MS: no deformity or atrophy Skin: warm and dry, device pocket is well healed Neuro:  Strength and sensation are intact Psych: euthymic mood, full affect  EKG:  EKG is ordered today. The ekg ordered today shows AV pacing  Device interrogation is reviewed today in detail.  See PaceArt for details.   Recent Labs: 01/16/2014: ALT 19; B Natriuretic Peptide 1411.3*; Magnesium 2.1 01/30/2014: BUN 16; Creatinine 1.19; Hemoglobin 12.0*; Platelets 179.0; Potassium  3.5; Sodium 138 03/05/2014: TSH 8.40*    Lipid Panel  No results found for: CHOL, TRIG, HDL, CHOLHDL, VLDL, LDLCALC, LDLDIRECT   Wt Readings from Last 3 Encounters:  03/14/14 155 lb 12.8 oz (70.67 kg)  01/30/14 157 lb 1.9 oz (71.269 kg)  01/20/14 156 lb 14.4 oz (71.169 kg)      Other studies Reviewed: Additional studies/ records that were reviewed today include: device clinic note, Eulis Foster notes    ASSESSMENT AND PLAN:  1.  Complete heart block Normal pacemaker  function GIven exertional chest pain with rate response,  I have made rate response much less aggressive today.  With ambulation around the clinic, his symptoms are now resolved. No other changes are made carelink  2. Chest pain/ CAD CP is resolved with PPM reprogramming as above Given advanced age, a conservative management strategy is planned.  3. HTN Stable No change required today   Current medicines are reviewed at length with the patient today.   The patient does not have concerns regarding his medicines.  The following changes were made today:  none  Labs/ tests ordered today include:  Orders Placed This Encounter  Procedures  . Implantable device check    Follow-up: with Dr Gus Rankin as scheduled, Carelink, return to see device MD/PA in a year  Signed, Thompson Grayer, MD  03/15/2014 9:07 PM     [facility] [address] [place] [address] [phone] (office) [phone] (fax)

## 2014-04-12 ENCOUNTER — Other Ambulatory Visit: Payer: Self-pay | Admitting: Dermatology

## 2014-04-12 ENCOUNTER — Telehealth: Payer: Self-pay | Admitting: *Deleted

## 2014-04-12 NOTE — Telephone Encounter (Signed)
lvm patients appointment needs to be moved due to appointment being on flex schedule

## 2014-04-30 ENCOUNTER — Ambulatory Visit: Payer: Medicare Other | Admitting: Nurse Practitioner

## 2014-04-30 ENCOUNTER — Other Ambulatory Visit: Payer: Self-pay

## 2014-04-30 ENCOUNTER — Other Ambulatory Visit (INDEPENDENT_AMBULATORY_CARE_PROVIDER_SITE_OTHER): Payer: Medicare Other | Admitting: *Deleted

## 2014-04-30 DIAGNOSIS — R5383 Other fatigue: Secondary | ICD-10-CM | POA: Diagnosis not present

## 2014-04-30 DIAGNOSIS — E039 Hypothyroidism, unspecified: Secondary | ICD-10-CM

## 2014-04-30 LAB — TSH: TSH: 5.92 u[IU]/mL — ABNORMAL HIGH (ref 0.35–4.50)

## 2014-05-04 ENCOUNTER — Telehealth: Payer: Self-pay | Admitting: *Deleted

## 2014-05-04 NOTE — Telephone Encounter (Signed)
Called patient to assess CP/SOB after pacer sensor changes 03/14/14. Pt reports he is still having chest pain if he walks at normal to fast pace, he has to walk very slowly to avoid chest pain. Scheduled patient for device clinic 05/16/14 while he is here to see Truitt Merle.

## 2014-05-16 ENCOUNTER — Ambulatory Visit (INDEPENDENT_AMBULATORY_CARE_PROVIDER_SITE_OTHER): Payer: Medicare Other | Admitting: Nurse Practitioner

## 2014-05-16 ENCOUNTER — Encounter: Payer: Self-pay | Admitting: Nurse Practitioner

## 2014-05-16 ENCOUNTER — Ambulatory Visit (INDEPENDENT_AMBULATORY_CARE_PROVIDER_SITE_OTHER): Payer: Medicare Other | Admitting: *Deleted

## 2014-05-16 VITALS — BP 130/48 | HR 63 | Ht 67.0 in | Wt 164.0 lb

## 2014-05-16 DIAGNOSIS — I10 Essential (primary) hypertension: Secondary | ICD-10-CM

## 2014-05-16 DIAGNOSIS — Z950 Presence of cardiac pacemaker: Secondary | ICD-10-CM

## 2014-05-16 DIAGNOSIS — Z45018 Encounter for adjustment and management of other part of cardiac pacemaker: Secondary | ICD-10-CM

## 2014-05-16 DIAGNOSIS — I480 Paroxysmal atrial fibrillation: Secondary | ICD-10-CM | POA: Diagnosis not present

## 2014-05-16 DIAGNOSIS — I25810 Atherosclerosis of coronary artery bypass graft(s) without angina pectoris: Secondary | ICD-10-CM

## 2014-05-16 LAB — CUP PACEART INCLINIC DEVICE CHECK
Battery Impedance: 100 Ohm
Battery Remaining Longevity: 120 mo
Battery Voltage: 2.79 V
Brady Statistic AP VP Percent: 99 %
Brady Statistic AP VS Percent: 1 %
Brady Statistic AS VP Percent: 0 %
Brady Statistic AS VS Percent: 0 %
Date Time Interrogation Session: 20160511093945
Lead Channel Impedance Value: 659 Ohm
MDC IDC MSMT LEADCHNL RA IMPEDANCE VALUE: 509 Ohm
MDC IDC SET LEADCHNL RA PACING AMPLITUDE: 2 V
MDC IDC SET LEADCHNL RV PACING AMPLITUDE: 2.5 V
MDC IDC SET LEADCHNL RV PACING PULSEWIDTH: 0.4 ms
MDC IDC SET LEADCHNL RV SENSING SENSITIVITY: 4 mV

## 2014-05-16 LAB — BASIC METABOLIC PANEL
BUN: 14 mg/dL (ref 6–23)
CO2: 30 mEq/L (ref 19–32)
Calcium: 9.3 mg/dL (ref 8.4–10.5)
Chloride: 101 mEq/L (ref 96–112)
Creatinine, Ser: 1.11 mg/dL (ref 0.40–1.50)
GFR: 66.34 mL/min (ref >60.00)
Glucose, Bld: 249 mg/dL — ABNORMAL HIGH (ref 70–99)
Potassium: 4.1 mEq/L (ref 3.5–5.1)
Sodium: 138 mEq/L (ref 135–145)

## 2014-05-16 LAB — BRAIN NATRIURETIC PEPTIDE: Pro B Natriuretic peptide (BNP): 273 pg/mL — ABNORMAL HIGH (ref 0.0–100.0)

## 2014-05-16 NOTE — Progress Notes (Signed)
Pt continues to c/o of c/p during exertion. Changed ADL & Exertion Response both from 1 to 2. Successfully walk tested pt w/out c/p. Pt aware Carelink 06/13/14. ROV w/ AS in 50mo.

## 2014-05-16 NOTE — Progress Notes (Signed)
CARDIOLOGY OFFICE NOTE  Date:  05/16/2014    [name] Date of Birth: 1923-05-02 Medical Record #[id number]  PCP:  Horatio Pel, MD  Cardiologist:  Johnsie Cancel    Chief Complaint  Patient presents with  . FU for CAD and PAF    Follow up visit - seen for [name] and [facility]     History of Present Illness: Evan [name] is a 81 y.o. male who presents today for a follow up visit. Seen for [name] and Dr. Rayann Heman. He is an 81 year old male with known CAD - prior CABG back in 1996. Admitted in December of 2015 with syncope/bradycardia and chest pain - was cathed and had DES to the SVG to the OM - had 5/5 patent bypass grafts. On DAPT for one year with Plavix and aspirin. Cath was followed by PPM implant per Dr. Rayann Heman - dual chamber device. Has chronic weakness in the legs/atrophy and has had frequent falls. He is on chronic amiodarone for PAF. Other issues include aortic stenosis, DM, HLD, and HTN. Has some mild LV dysfunction per recent echo with grade 1 diastolic dysfunction.   He was last seen here right after Christmas to see [name] and seemed to be doing ok - continuing to have chronic weakness of his legs.   I saw him earlier this year in the flex clinic - concern for low HR - pacer set at 50. Evidence of volume overload. He was admitted for diuresis. He is a DNR. He was discharged back to nursing facility on O2 (O2 sats 87 with ambulation). ARB was stopped secondary to renal insufficiency. Pacemaker rate was increased from 50 to 60 at the time of his admission. The pt has had chronic leg pain and weakness. Arterial dopplers done showed distal Lt poplitial disease of 50-99%-no intervention planned.   Last seen by me at the end of January - he was doing well. Saw Dr. Rayann Heman in March - device was reprogrammed for chest pain.   Comes in today. Here alone. Has been referred back here by Dr. Shelia Media - looks like he has had more angina. He does not know his  medicines and really not able to verify exactly what he is taking. Has had his device checked prior to my visit. Back in assisted living at [facility]. He did not bring any papers - says he forgot to tell them he was coming here. He still drives - "cautiously" and with "trepidation". He has gained some weight but not short of breath. Trace swelling. Says that with the pacemaker change this AM he walked around and felt fine.  Past Medical History  Diagnosis Date  . HYPERTENSION   . HYPERCHOLESTEROLEMIA   . CAD   . TRANSIENT ISCHEMIC ATTACKS, HX OF   . NEPHROLITHIASIS, HX OF   . GERD   . Trifascicular block     a. s/p MDT ADDRL1 pacemaker Dr Rayann Heman  . Syncope   . ALLERGIC RHINITIS   . Paroxysmal atrial fibrillation   . Atrial tachycardia   . CHF (congestive heart failure)   . Anginal pain   . DM     type 2  . Arthritis   . Presence of permanent cardiac pacemaker 12/12/2013  . Diarrhea   . Stable angina   . Thyroid disease   . Iron deficiency anemia   . H/O: GI bleed   . GERD (gastroesophageal reflux disease)   . Urinary frequency   .  Weakness of left leg   . AC joint dislocation   . Rib fracture   . Nephrolithiasis   . Allergic rhinitis   . Vertigo   . BPH (benign prostatic hyperplasia)     Past Surgical History  Procedure Laterality Date  . Ptca  1980's  . Coronary artery bypass graft  1996  . Back surgery  2000'S  . Av fistula repair    . 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 Coronary stent placement  12/11/13    SVG-OM DES  . Left heart catheterization with coronary/graft angiogram N/A 12/11/2013    Procedure: LEFT HEART CATHETERIZATION WITH Beatrix Fetters;  Surgeon: Burnell Blanks, MD;  Location: [facility] CATH LAB;  Service: Cardiovascular;  Laterality: N/A;  . Cardiac catheterization  12/11/2013    Procedure: CORONARY STENT INTERVENTION;  Surgeon: Burnell Blanks, MD;  Location: [facility] CATH LAB;  Service:  Cardiovascular;;  DES 3.5 x 15 Resolute to seq SVG of OM1/OM2   . Permanent pacemaker insertion N/A 12/12/2013    MDT ADDRL1 pacemaker implnated by Dr Rayann Heman  . Insert / replace / remove pacemaker       Medications: Current Outpatient Prescriptions  Medication Sig Dispense Refill  . acetaminophen (TYLENOL) 500 MG tablet Take 500 mg by mouth every 6 (six) hours as needed for moderate pain.    Marland Kitchen amiodarone (PACERONE) 200 MG tablet Take 1 tablet (200 mg total) by mouth daily. 30 tablet 6  . amLODipine (NORVASC) 5 MG tablet Take 1 tablet (5 mg total) by mouth daily. 30 tablet 11  . aspirin 81 MG chewable tablet Chew 1 tablet (81 mg total) by mouth daily.    Marland Kitchen atorvastatin (LIPITOR) 10 MG tablet Take 1 tablet by mouth daily.  0  . carvedilol (COREG) 6.25 MG tablet Take 6.25 mg by mouth 2 (two) times daily with a meal.    . clopidogrel (PLAVIX) 75 MG tablet Take 1 tablet (75 mg total) by mouth daily with breakfast. 30 tablet 11  . finasteride (PROSCAR) 5 MG tablet Take 5 mg by mouth daily.    . furosemide (LASIX) 40 MG tablet Take 40 mg by mouth daily. Pt takes 60 mg daily    . hydrALAZINE (APRESOLINE) 25 MG tablet Take 3 tablets (75 mg total) by mouth every 8 (eight) hours. (Patient taking differently: Take 75 mg by mouth 3 (three) times daily. ) 100 tablet 11  . isosorbide mononitrate (IMDUR) 30 MG 24 hr tablet Take 1 tablet (30 mg total) by mouth daily. 30 tablet 11  . NITROSTAT 0.4 MG SL tablet PLACE 1 TABLET (0.4 MG TOTAL) UNDER THE TONGUE EVERY 5 (FIVE) MINUTES AS NEEDED FOR CHEST PAIN. 25 tablet 1  . saxagliptin HCl (ONGLYZA) 5 MG TABS tablet Take 5 mg by mouth daily.     . traMADol (ULTRAM) 50 MG tablet Take 1 tablet (50 mg total) by mouth every 6 (six) hours as needed. (Patient taking differently: Take 50 mg by mouth every 6 (six) hours as needed for moderate pain. ) 10 tablet 0  . trandolapril (MAVIK) 2 MG tablet Take 2 mg by mouth daily.    . vitamin C (ASCORBIC ACID) 500 MG tablet Take  500 mg by mouth daily.       No current facility-administered medications for this visit.    Allergies: Allergies  Allergen Reactions  . Meperidine Hcl     REACTION: Hypotension  . Demerol Other (See Comments)  hypotension  . Heparin Other (See Comments)    hypotension  . Metformin And Related Diarrhea    Social History: The patient  reports that he quit smoking about 58 years ago. He has never used smokeless tobacco. He reports that he does not drink alcohol or use illicit drugs.   Family History: The patient's family history includes Coronary artery disease in his father; Diabetes in his mother.   Review of Systems: Please see the history of present illness.   Otherwise, the review of systems is positive for chest pain.   All other systems are reviewed and negative.   Physical Exam: VS:  BP 130/48 mmHg  Pulse 63  Ht 5\' 7"  (1.702 m)  Wt 164 lb (74.39 kg)  BMI 25.68 kg/m2  SpO2 100% .  BMI Body mass index is 25.68 kg/(m^2).  Wt Readings from Last 3 Encounters:  05/16/14 164 lb (74.39 kg)  03/14/14 155 lb 12.8 oz (70.67 kg)  01/30/14 157 lb 1.9 oz (71.269 kg)    General: Pleasant, elderly male. He is in no acute distress. His weight is up 9 pounds. HEENT: Normal but with missing teeth. Neck: Supple, no JVD, carotid bruits, or masses noted.  Cardiac: Regular rate and rhythm. Harsh outflow murmur. Trace edema.  Respiratory:  Lungs are clear to auscultation bilaterally with normal work of breathing.  GI: Soft and nontender.  MS: No deformity or atrophy. Gait and ROM intact. Skin: Warm and dry. Color is normal.  Neuro:  Strength and sensation are intact and no gross focal deficits noted.  Psych: Alert, appropriate and with normal affect.   LABORATORY DATA:  EKG:  EKG is not ordered today.   Lab Results  Component Value Date   WBC 4.9 01/30/2014   HGB 12.0* 01/30/2014   HCT 36.4* 01/30/2014   PLT 179.0 01/30/2014   GLUCOSE 183* 01/30/2014   ALT 19 01/16/2014    AST 20 01/16/2014   NA 138 01/30/2014   K 3.5 01/30/2014   CL 101 01/30/2014   CREATININE 1.19 01/30/2014   BUN 16 01/30/2014   CO2 27 01/30/2014   TSH 5.92* 04/30/2014   INR 1.28 01/16/2014    BNP (last 3 results)  Recent Labs  01/16/14 1147  BNP 1411.3*    ProBNP (last 3 results) No results for input(s): PROBNP in the last 8760 hours.   Other Studies Reviewed Today:  Echo Study Conclusions from 12/2013  - Left ventricle: The cavity size was normal. Wall thickness was normal. Systolic function was mildly reduced. The estimated ejection fraction was in the range of 45% to 50%. Diffuse hypokinesis. Doppler parameters are consistent with abnormal left ventricular relaxation (grade 1 diastolic dysfunction). - Aortic valve: There was mild regurgitation. - Mitral valve: Calcified annulus. - Left atrium: The atrium was mildly dilated.   Angiographic Findings from 12/2013:  Left main: 30% distal stenosis.   Left Anterior Descending Artery: 100% proximal occlusion. Mid and distal LAD fills from the patent vein graft. The diagonal fills from the patent vein graft.   Circumflex Artery: 100% proximal occlusion. The first and second OM branches fill from the patent vein graft.   Right Coronary Artery: Large dominant vessel with diffuse 99% stenosis in the proximal, mid and distal vessel. The PDA and PLA fill from the patent vein graft.   Graft Anatomy:  SVG to PDA is patent SVG to OM1/OM2 is patent with 95% stenosis mid body of SVG SVG to LAD is patent SVG to Diagonal is  patent  Left Ventricular Angiogram: Deferred.   Impression: 1. Severe triple vessel disease s/p 5 vessel CABG with 5/5 patent bypass grafts. 2. Severe stenosis mid body of SVG to OM 3. Unstable angina 4. Successful PTCA/DES x 1 mid body of SVG to OM1/OM2  Recommendations: Continue ASA and Plavix for one year.        Assessment / Plan: 1. Chronic combined systolic and  diastolic HF - weight is up a little. Little swelling. Will check BNP and BMET today.    2. Recent PCI/DES to SVG to OM1/OM2 - continue DAPT. Continues with chest pain and DOE - may be better with recent PPM reprogramming - says this has helped - will give him a couple of days - if no better - he is to call us - could increase his Imdur. Will get his recall with [name] set up.  3. Underlying PPM - followed by Dr. Rayann Heman with recent check -more reprogramming today with his rate responsiveness - will see how he does - may need to increase his nitrates further.    4. Advanced age  Current medicines are reviewed with the patient today.  The patient does not have concerns regarding medicines other than what has been noted above.  The following changes have been made:  See above.  Labs/ tests ordered today include:    Orders Placed This Encounter  Procedures  . Basic metabolic panel  . Brain natriuretic peptide     Disposition:   FU with [name] in 6 months.   Patient is agreeable to this plan and will call if any problems develop in the interim.   Signed: Burtis Junes, RN, ANP-C 05/16/2014 9:56 AM  [facility] [address] [place], [place]  [address] Phone: [phone] Fax: [phone]

## 2014-05-16 NOTE — Patient Instructions (Addendum)
We will be checking the following labs today - BMET & BNP   Medication Instructions:    Continue with your current medicines.     Testing/Procedures To Be Arranged:  N/A  Follow-Up:   Schedule recall with [name] for June/July    Other Special Instructions:   Call us if you continue to have chest pain or shortness of breath and the pacemaker change did not help - I can change your medicines.  Call the [facility] office at [phone] if you have any questions, problems or concerns.

## 2014-05-22 ENCOUNTER — Telehealth: Payer: Self-pay | Admitting: Nurse Practitioner

## 2014-05-22 NOTE — Telephone Encounter (Signed)
Pt is doing better.  Truitt Merle, NP is aware and will leave medications alone.

## 2014-05-22 NOTE — Telephone Encounter (Signed)
Pt was told to call Danielle today--pls call

## 2014-06-05 ENCOUNTER — Encounter: Payer: Self-pay | Admitting: Internal Medicine

## 2014-06-13 ENCOUNTER — Ambulatory Visit (INDEPENDENT_AMBULATORY_CARE_PROVIDER_SITE_OTHER): Payer: Medicare Other | Admitting: *Deleted

## 2014-06-13 ENCOUNTER — Other Ambulatory Visit: Payer: Self-pay | Admitting: Internal Medicine

## 2014-06-13 ENCOUNTER — Telehealth: Payer: Self-pay | Admitting: Cardiology

## 2014-06-13 DIAGNOSIS — I495 Sick sinus syndrome: Secondary | ICD-10-CM | POA: Diagnosis not present

## 2014-06-13 LAB — CUP PACEART REMOTE DEVICE CHECK
Battery Impedance: 100 Ohm
Battery Voltage: 2.78 V
Brady Statistic AP VP Percent: 100 %
Brady Statistic AP VS Percent: 0 %
Brady Statistic AS VS Percent: 0 %
Date Time Interrogation Session: 20160608184919
Lead Channel Impedance Value: 659 Ohm
Lead Channel Pacing Threshold Amplitude: 0.625 V
Lead Channel Pacing Threshold Pulse Width: 0.4 ms
Lead Channel Pacing Threshold Pulse Width: 0.4 ms
Lead Channel Setting Pacing Amplitude: 2.5 V
Lead Channel Setting Sensing Sensitivity: 4 mV
MDC IDC MSMT BATTERY REMAINING LONGEVITY: 119 mo
MDC IDC MSMT LEADCHNL RA IMPEDANCE VALUE: 517 Ohm
MDC IDC MSMT LEADCHNL RV PACING THRESHOLD AMPLITUDE: 0.75 V
MDC IDC SET LEADCHNL RA PACING AMPLITUDE: 2 V
MDC IDC SET LEADCHNL RV PACING PULSEWIDTH: 0.4 ms
MDC IDC STAT BRADY AS VP PERCENT: 0 %

## 2014-06-13 NOTE — Progress Notes (Signed)
Remote pacemaker transmission.   

## 2014-06-13 NOTE — Telephone Encounter (Signed)
Spoke with pt and reminded pt of remote transmission that is due today. Pt verbalized understanding.   

## 2014-06-17 LAB — CUP PACEART REMOTE DEVICE CHECK
Battery Remaining Longevity: 119 mo
Brady Statistic AP VP Percent: 100 %
Brady Statistic AP VS Percent: 0 %
Brady Statistic AS VP Percent: 0 %
Brady Statistic AS VS Percent: 0 %
Date Time Interrogation Session: 20160608184919
Lead Channel Pacing Threshold Amplitude: 0.625 V
Lead Channel Pacing Threshold Pulse Width: 0.4 ms
Lead Channel Pacing Threshold Pulse Width: 0.4 ms
Lead Channel Setting Pacing Amplitude: 2 V
Lead Channel Setting Pacing Amplitude: 2.5 V
Lead Channel Setting Sensing Sensitivity: 4 mV
MDC IDC MSMT BATTERY IMPEDANCE: 100 Ohm
MDC IDC MSMT BATTERY VOLTAGE: 2.78 V
MDC IDC MSMT LEADCHNL RA IMPEDANCE VALUE: 517 Ohm
MDC IDC MSMT LEADCHNL RV IMPEDANCE VALUE: 659 Ohm
MDC IDC MSMT LEADCHNL RV PACING THRESHOLD AMPLITUDE: 0.75 V
MDC IDC SET LEADCHNL RV PACING PULSEWIDTH: 0.4 ms

## 2014-06-28 ENCOUNTER — Encounter: Payer: Self-pay | Admitting: Cardiology

## 2014-07-04 ENCOUNTER — Encounter: Payer: Self-pay | Admitting: Internal Medicine

## 2014-07-30 ENCOUNTER — Other Ambulatory Visit (INDEPENDENT_AMBULATORY_CARE_PROVIDER_SITE_OTHER): Payer: Medicare Other | Admitting: *Deleted

## 2014-07-30 DIAGNOSIS — E039 Hypothyroidism, unspecified: Secondary | ICD-10-CM | POA: Diagnosis not present

## 2014-07-30 LAB — TSH: TSH: 8.68 u[IU]/mL — ABNORMAL HIGH (ref 0.35–4.50)

## 2014-07-30 NOTE — Addendum Note (Signed)
Addended by: Eulis Foster on: 07/30/2014 07:59 AM   Modules accepted: Orders

## 2014-07-31 NOTE — Progress Notes (Signed)
Patient ID: CLEVESTER HELZER, male   DOB: 1923/06/25, 80 y.o.   MRN: [id number]     CARDIOLOGY OFFICE NOTE  Date:  07/31/2014    [name] Date of Birth: [birth year] Medical Record #[id number]  PCP:  Horatio Pel, MD  Cardiologist:  Johnsie Cancel    No chief complaint on file.    History of Present Illness: Laymon FINLEE MILO is a 81 y.o. male who presents today for f/u SSS and CAD  Prior CABG back in 1996. Admitted in December of 2015 with syncope/bradycardia and chest pain - was cathed and had DES to the SVG to the OM - had 5/5 patent bypass grafts. On DAPT for one year with Plavix and aspirin. Cath was followed by PPM implant per Dr. Rayann Heman - dual chamber device. Has chronic weakness in the legs/atrophy and has had frequent falls. He is on chronic amiodarone for PAF. Other issues include aortic stenosis, DM, HLD, and HTN. Has some mild LV dysfunction per recent echo with grade 1 diastolic dysfunction.     Last visit with PA lower pacer rate limit increased to 60 bpm   He is a DNR. He was discharged back to nursing facility on O2 (O2 sats 87 with ambulation). ARB was stopped secondary to renal insufficiency. Arterial dopplers done showed distal Lt poplitial disease of 50-99%-no intervention planned.      Past Medical History  Diagnosis Date  . HYPERTENSION   . HYPERCHOLESTEROLEMIA   . CAD   . TRANSIENT ISCHEMIC ATTACKS, HX OF   . NEPHROLITHIASIS, HX OF   . GERD   . Trifascicular block     a. s/p MDT ADDRL1 pacemaker Dr Rayann Heman  . Syncope   . ALLERGIC RHINITIS   . Paroxysmal atrial fibrillation   . Atrial tachycardia   . CHF (congestive heart failure)   . Anginal pain   . DM     type 2  . Arthritis   . Presence of permanent cardiac pacemaker 12/12/2013  . Diarrhea   . Stable angina   . Thyroid disease   . Iron deficiency anemia   . H/O: GI bleed   . GERD (gastroesophageal reflux disease)   . Urinary frequency   . Weakness of left leg   . AC joint  dislocation   . Rib fracture   . Nephrolithiasis   . Allergic rhinitis   . Vertigo   . BPH (benign prostatic hyperplasia)     Past Surgical History  Procedure Laterality Date  . Ptca  1980's  . Coronary artery bypass graft  1996  . Back surgery  2000'S  . Av fistula repair    . 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 Coronary stent placement  12/11/13    SVG-OM DES  . Left heart catheterization with coronary/graft angiogram N/A 12/11/2013    Procedure: LEFT HEART CATHETERIZATION WITH Beatrix Fetters;  Surgeon: Burnell Blanks, MD;  Location: [facility] C[facility] LAB;  Service: Cardiovascular;  Laterality: N/A;  . Cardiac catheterization  12/11/2013    Procedure: CORONARY STENT INTERVENTION;  Surgeon: Burnell Blanks, MD;  Location: [facility] CATH LAB;  Service: Cardiovascular;;  DES 3.5 x 15 Resolute to seq SVG of OM1/OM2   . Permanent pacemaker insertion N/A 12/12/2013    MDT ADDRL1 pacemaker implnated by Dr Rayann Heman  . Insert / replace / remove pacemaker       Medications: Current Outpatient Prescriptions  Medication Sig Dispense Refill  . acetaminophen (TYLENOL)  500 MG tablet Take 500 mg by mouth every 6 (six) hours as needed for moderate pain.    Marland Kitchen amiodarone (PACERONE) 200 MG tablet Take 1 tablet (200 mg total) by mouth daily. 30 tablet 6  . amLODipine (NORVASC) 5 MG tablet Take 1 tablet (5 mg total) by mouth daily. 30 tablet 11  . aspirin 81 MG chewable tablet Chew 1 tablet (81 mg total) by mouth daily.    Marland Kitchen atorvastatin (LIPITOR) 10 MG tablet Take 1 tablet by mouth daily.  0  . carvedilol (COREG) 6.25 MG tablet Take 6.25 mg by mouth 2 (two) times daily with a meal.    . clopidogrel (PLAVIX) 75 MG tablet Take 1 tablet (75 mg total) by mouth daily with breakfast. 30 tablet 11  . finasteride (PROSCAR) 5 MG tablet Take 5 mg by mouth daily.    . furosemide (LASIX) 40 MG tablet Take 40 mg by mouth daily. Pt takes 60 mg daily    . hydrALAZINE  (APRESOLINE) 25 MG tablet Take 3 tablets (75 mg total) by mouth every 8 (eight) hours. (Patient taking differently: Take 75 mg by mouth 3 (three) times daily. ) 100 tablet 11  . isosorbide mononitrate (IMDUR) 30 MG 24 hr tablet Take 1 tablet (30 mg total) by mouth daily. 30 tablet 11  . NITROSTAT 0.4 MG SL tablet PLACE 1 TABLET (0.4 MG TOTAL) UNDER THE TONGUE EVERY 5 (FIVE) MINUTES AS NEEDED FOR CHEST PAIN. 25 tablet 1  . saxagliptin HCl (ONGLYZA) 5 MG TABS tablet Take 5 mg by mouth daily.     . traMADol (ULTRAM) 50 MG tablet Take 1 tablet (50 mg total) by mouth every 6 (six) hours as needed. (Patient taking differently: Take 50 mg by mouth every 6 (six) hours as needed for moderate pain. ) 10 tablet 0  . trandolapril (MAVIK) 2 MG tablet Take 2 mg by mouth daily.    . vitamin C (ASCORBIC ACID) 500 MG tablet Take 500 mg by mouth daily.       No current facility-administered medications for this visit.    Allergies: Allergies  Allergen Reactions  . Meperidine Hcl     REACTION: Hypotension  . Demerol Other (See Comments)    hypotension  . Heparin Other (See Comments)    hypotension  . Metformin And Related Diarrhea    Social History: The patient  reports that he quit smoking about 58 years ago. He has never used smokeless tobacco. He reports that he does not drink alcohol or use illicit drugs.   Family History: The patient's family history includes Coronary artery disease in his father; Diabetes in his mother.   Review of Systems: Please see the history of present illness.   Otherwise, the review of systems is positive for chest pain.   All other systems are reviewed and negative.   Physical Exam: VS:  There were no vitals taken for this visit. Marland Kitchen  BMI There is no weight on file to calculate BMI.  Wt Readings from Last 3 Encounters:  05/16/14 74.39 kg (164 lb)  03/14/14 70.67 kg (155 lb 12.8 oz)  01/30/14 71.269 kg (157 lb 1.9 oz)    General: Pleasant, elderly male. He is in no  acute distress. His weight is up 9 pounds. HEENT: Normal but with missing teeth. Neck: Supple, no JVD, carotid bruits, or masses noted.  Cardiac: Regular rate and rhythm. Harsh outflow murmur. Trace edema.  Respiratory:  Lungs are clear to auscultation bilaterally with normal work  of breathing.  GI: Soft and nontender.  MS: No deformity or atrophy. Gait and ROM intact. Skin: Warm and dry. Color is normal.  Neuro:  Strength and sensation are intact and no gross focal deficits noted.  Psych: Alert, appropriate and with normal affect.   LABORATORY DATA:  EKG:  03/14/14  AV pacing rate 64    Lab Results  Component Value Date   WBC 4.9 01/30/2014   HGB 12.0* 01/30/2014   HCT 36.4* 01/30/2014   PLT 179.0 01/30/2014   GLUCOSE 249* 05/16/2014   ALT 19 01/16/2014   AST 20 01/16/2014   NA 138 05/16/2014   K 4.1 05/16/2014   CL 101 05/16/2014   CREATININE 1.11 05/16/2014   BUN 14 05/16/2014   CO2 30 05/16/2014   TSH 8.68* 07/30/2014   INR 1.28 01/16/2014    BNP (last 3 results)  Recent Labs  01/16/14 1147  BNP 1411.3*    ProBNP (last 3 results)  Recent Labs  05/16/14 1011  PROBNP 273.0*     Other Studies Reviewed Today:  Echo Study Conclusions from 12/2013  - Left ventricle: The cavity size was normal. Wall thickness was normal. Systolic function was mildly reduced. The estimated ejection fraction was in the range of 45% to 50%. Diffuse hypokinesis. Doppler parameters are consistent with abnormal left ventricular relaxation (grade 1 diastolic dysfunction). - Aortic valve: There was mild regurgitation. - Mitral valve: Calcified annulus. - Left atrium: The atrium was mildly dilated.   Angiographic Findings from 12/2013:  Left main: 30% distal stenosis.   Left Anterior Descending Artery: 100% proximal occlusion. Mid and distal LAD fills from the patent vein graft. The diagonal fills from the patent vein graft.   Circumflex Artery: 100% proximal  occlusion. The first and second OM branches fill from the patent vein graft.   Right Coronary Artery: Large dominant vessel with diffuse 99% stenosis in the proximal, mid and distal vessel. The PDA and PLA fill from the patent vein graft.   Graft Anatomy:  SVG to PDA is patent SVG to OM1/OM2 is patent with 95% stenosis mid body of SVG SVG to LAD is patent SVG to Diagonal is patent  Left Ventricular Angiogram: Deferred.   Impression: 1. Severe triple vessel disease s/p 5 vessel CABG with 5/5 patent bypass grafts. 2. Severe stenosis mid body of SVG to OM 3. Unstable angina 4. Successful PTCA/DES x 1 mid body of SVG to OM1/OM2  Recommendations: Continue ASA and Plavix for one year.        Assessment / Plan: 1. Chronic combined systolic and diastolic HF - stable  Continue current meds    2. 12/2013  PCI/DES to SVG to OM1/OM2 - continue DAPT.   3. Underlying PPM - followed by Dr. Rayann Heman with recent check -more reprogramming today with his rate responsiveness - will see how he does - may need to increase his nitrates further.    4. Advanced age  DNR  Current medicines are reviewed with the patient today.  The patient does not have concerns regarding medicines other than what has been noted above.  The following changes have been made:  See above.  Labs/ tests ordered today include:    No orders of the defined types were placed in this encounter.     Disposition:   FU with me  in 6 months.   Patient is agreeable to this plan and will call if any problems develop in the interim.   Jenkins Rouge

## 2014-08-01 ENCOUNTER — Encounter: Payer: Self-pay | Admitting: Cardiovascular Disease

## 2014-08-01 ENCOUNTER — Ambulatory Visit (INDEPENDENT_AMBULATORY_CARE_PROVIDER_SITE_OTHER): Payer: Medicare Other | Admitting: Cardiovascular Disease

## 2014-08-01 ENCOUNTER — Telehealth: Payer: Self-pay

## 2014-08-01 VITALS — BP 130/62 | HR 71 | Wt 165.2 lb

## 2014-08-01 DIAGNOSIS — I2510 Atherosclerotic heart disease of native coronary artery without angina pectoris: Secondary | ICD-10-CM | POA: Diagnosis not present

## 2014-08-01 DIAGNOSIS — I2583 Coronary atherosclerosis due to lipid rich plaque: Principal | ICD-10-CM

## 2014-08-01 DIAGNOSIS — R7989 Other specified abnormal findings of blood chemistry: Secondary | ICD-10-CM

## 2014-08-01 MED ORDER — LEVOTHYROXINE SODIUM 50 MCG PO TABS: 50.0000 ug | ORAL_TABLET | Freq: Every day | ORAL | Status: DC

## 2014-08-01 NOTE — Telephone Encounter (Signed)
Called patient about lab results. Per [name], increase his synthroid to 50 mcg per day and TSH in 2 months. Patient verbalized understanding, but we need to contact [place] at [employer]. [place] with orders. Talked to Rosalia Hammers RN at nurses station and will send fax to [phone].

## 2014-08-20 ENCOUNTER — Telehealth: Payer: Self-pay | Admitting: Cardiovascular Disease

## 2014-08-20 NOTE — Telephone Encounter (Signed)
Rec'd from [place] forward 4 pages to [place]

## 2014-09-13 ENCOUNTER — Telehealth: Payer: Self-pay | Admitting: Cardiology

## 2014-09-13 ENCOUNTER — Encounter: Payer: Medicare Other | Admitting: *Deleted

## 2014-09-13 NOTE — Telephone Encounter (Signed)
LMOVM reminding pt to send remote transmission.   

## 2014-09-14 ENCOUNTER — Encounter: Payer: Self-pay | Admitting: Cardiology

## 2014-10-02 ENCOUNTER — Other Ambulatory Visit: Payer: Medicare Other

## 2014-10-02 ENCOUNTER — Other Ambulatory Visit (INDEPENDENT_AMBULATORY_CARE_PROVIDER_SITE_OTHER): Payer: Medicare Other | Admitting: *Deleted

## 2014-10-02 DIAGNOSIS — R7989 Other specified abnormal findings of blood chemistry: Secondary | ICD-10-CM

## 2014-10-02 LAB — TSH: TSH: 4.92 u[IU]/mL — ABNORMAL HIGH (ref 0.35–4.50)

## 2014-10-03 ENCOUNTER — Other Ambulatory Visit: Payer: Self-pay | Admitting: *Deleted

## 2014-10-03 DIAGNOSIS — E079 Disorder of thyroid, unspecified: Secondary | ICD-10-CM

## 2014-12-19 ENCOUNTER — Other Ambulatory Visit: Payer: Medicare Other

## 2014-12-20 ENCOUNTER — Other Ambulatory Visit (INDEPENDENT_AMBULATORY_CARE_PROVIDER_SITE_OTHER): Payer: Medicare Other | Admitting: *Deleted

## 2014-12-20 DIAGNOSIS — I495 Sick sinus syndrome: Secondary | ICD-10-CM | POA: Diagnosis not present

## 2014-12-20 DIAGNOSIS — I480 Paroxysmal atrial fibrillation: Secondary | ICD-10-CM | POA: Diagnosis not present

## 2014-12-20 LAB — TSH: TSH: 0.458 u[IU]/mL (ref 0.350–4.500)

## 2014-12-20 NOTE — Addendum Note (Signed)
Addended by: Eulis Foster on: 12/20/2014 09:01 AM   Modules accepted: Orders

## 2014-12-20 NOTE — Addendum Note (Signed)
Addended by: Eulis Foster on: 12/20/2014 03:15 PM   Modules accepted: Orders

## 2015-01-23 ENCOUNTER — Encounter: Payer: Self-pay | Admitting: *Deleted

## 2015-01-25 ENCOUNTER — Encounter: Payer: Self-pay | Admitting: Cardiovascular Disease

## 2015-01-25 NOTE — Progress Notes (Signed)
Patient ID: SHYON SLAGTER, male   DOB: [birth year], 82 y.o.   MRN: [id number]     CARDIOLOGY OFFICE NOTE  Date:  01/28/2015    [name] Date of Birth: 1923-12-03 Medical Record [id number]  PCP:  Horatio Pel, MD  Cardiologist:  Johnsie Cancel    Chief Complaint  Patient presents with  . other    sick sinus syndrome  . Coronary Artery Disease     History of Present Illness: Amarian JOHNATHON DOBBYN is a 82 y.o. male who presents today for f/u SSS and CAD  Prior CABG back in 1996. Admitted in December of 2015 with syncope/bradycardia and chest pain - was cathed and had DES to the SVG to the OM - had 5/5 patent bypass grafts. On DAPT for one year with Plavix and aspirin. Cath was followed by PPM implant per Dr. Rayann Heman - dual chamber device. Has chronic weakness in the legs/atrophy and has had frequent falls. He is on chronic amiodarone for PAF. Other issues include aortic stenosis, DM, HLD, and HTN. Has some mild LV dysfunction per recent echo with grade 1 diastolic dysfunction.     Last visit with PA lower pacer rate limit increased to 60 bpm   He is a DNR. He was discharged back to nursing facility on O2 (O2 sats 87 with ambulation). ARB was stopped secondary to renal insufficiency. Arterial dopplers done showed distal Lt poplitial disease of 50-99%-no intervention planned.   Synthroid dose increased by PA Lab Results  Component Value Date   TSH 0.458 12/20/2014     Angina stable with exertion has not needed to take nitro Unable to due trans telephonic monitoring at [facility] on [address]   Past Medical History  Diagnosis Date  . HYPERTENSION   . HYPERCHOLESTEROLEMIA   . CAD   . TRANSIENT ISCHEMIC ATTACKS, HX OF   . NEPHROLITHIASIS, HX OF   . GERD   . Trifascicular block     a. s/p MDT ADDRL1 pacemaker Dr Rayann Heman  . Syncope   . ALLERGIC RHINITIS   . Paroxysmal atrial fibrillation (HCC)   . Atrial tachycardia ([place])   . CHF (congestive heart failure)  ([place])   . Anginal pain ([place])   . DM     type 2  . Arthritis   . Presence of permanent cardiac pacemaker 12/12/2013  . Diarrhea   . Stable angina (HCC)   . Thyroid disease   . Iron deficiency anemia   . H/O: GI bleed   . GERD (gastroesophageal reflux disease)   . Urinary frequency   . Weakness of left leg   . AC joint dislocation   . Rib fracture   . Nephrolithiasis   . Allergic rhinitis   . Vertigo   . BPH (benign prostatic hyperplasia)     Past Surgical History  Procedure Laterality Date  . Ptca  1980's  . Coronary artery bypass graft  1996  . Back surgery  2000'S  . Av fistula repair    . 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 Coronary stent placement  12/11/13    SVG-OM DES  . Left heart catheterization with coronary/graft angiogram N/A 12/11/2013    Procedure: LEFT HEART CATHETERIZATION WITH Beatrix Fetters;  Surgeon: Burnell Blanks, MD;  Location: [facility] CATH LAB;  Service: Cardiovascular;  Laterality: N/A;  . Cardiac catheterization  12/11/2013    Procedure: CORONARY STENT INTERVENTION;  Surgeon: Burnell Blanks, MD;  Location: [place] CATH LAB;  Service: Cardiovascular;;  DES 3.5 x 15 Resolute to seq SVG of OM1/OM2   . Permanent pacemaker insertion N/A 12/12/2013    MDT ADDRL1 pacemaker implnated by Dr Rayann Heman  . Insert / replace / remove pacemaker       Medications: Current Outpatient Prescriptions  Medication Sig Dispense Refill  . amiodarone (PACERONE) 200 MG tablet Take 1 tablet (200 mg total) by mouth daily. 30 tablet 6  . amLODipine (NORVASC) 5 MG tablet Take 1 tablet (5 mg total) by mouth daily. 30 tablet 11  . aspirin 81 MG chewable tablet Chew 1 tablet (81 mg total) by mouth daily.    Marland Kitchen atorvastatin (LIPITOR) 10 MG tablet Take 1 tablet by mouth daily.  0  . carvedilol (COREG) 12.5 MG tablet Take 12.5 mg by mouth 2 (two) times daily with a meal.   4  . clopidogrel (PLAVIX) 75 MG tablet Take 1 tablet (75 mg  total) by mouth daily with breakfast. 30 tablet 11  . Cyanocobalamin (VITAMIN B-12 PO) Take 1 tablet by mouth 3 (three) times a week.    . finasteride (PROSCAR) 5 MG tablet Take 5 mg by mouth daily.    . furosemide (LASIX) 80 MG tablet Take 80 mg by mouth daily.   8  . hydrALAZINE (APRESOLINE) 25 MG tablet Take 3 tablets (75 mg total) by mouth every 8 (eight) hours. 100 tablet 11  . isosorbide mononitrate (IMDUR) 30 MG 24 hr tablet Take 1 tablet (30 mg total) by mouth daily. 30 tablet 11  . levothyroxine (SYNTHROID, LEVOTHROID) 112 MCG tablet Take 112 mcg by mouth daily before breakfast.    . metFORMIN (GLUCOPHAGE) 500 MG tablet Take 1,000 mg by mouth daily.  10  . NITROSTAT 0.4 MG SL tablet PLACE 1 TABLET (0.4 MG TOTAL) UNDER THE TONGUE EVERY 5 (FIVE) MINUTES AS NEEDED FOR CHEST PAIN. 25 tablet 1  . saxagliptin HCl (ONGLYZA) 5 MG TABS tablet Take 5 mg by mouth daily.     . traMADol (ULTRAM) 50 MG tablet Take 50 mg by mouth every 6 (six) hours as needed for moderate pain or severe pain.    . trandolapril (MAVIK) 2 MG tablet Take 2 mg by mouth daily.    . vitamin C (ASCORBIC ACID) 500 MG tablet Take 500 mg by mouth daily.       No current facility-administered medications for this visit.    Allergies: Allergies  Allergen Reactions  . Meperidine Hcl     REACTION: Hypotension  . Demerol Other (See Comments)    hypotension  . Heparin Other (See Comments)    hypotension  . Metformin And Related Diarrhea    Social History: The patient  reports that he quit smoking about 59 years ago. He has never used smokeless tobacco. He reports that he does not drink alcohol or use illicit drugs.   Family History: The patient's family history includes Coronary artery disease in his father; Diabetes in his mother.   Review of Systems: Please see the history of present illness.   Otherwise, the review of systems is positive for chest pain.   All other systems are reviewed and negative.   Physical  Exam: VS:  BP 130/50 mmHg  Pulse 62  Ht 5\' 5"  (1.651 m)  Wt 70.943 kg (156 lb 6.4 oz)  BMI 26.03 kg/m2  SpO2 96% .  BMI Body mass index is 26.03 kg/(m^2).  Wt Readings from Last 3 Encounters:  01/28/15 70.943 kg (  156 lb 6.4 oz)  08/01/14 74.957 kg (165 lb 4 oz)  05/16/14 74.39 kg (164 lb)    General: Pleasant, elderly male. He is in no acute distress. His weight is up 9 pounds. HEENT: Normal but with missing teeth. Neck: Supple, no JVD, carotid bruits, or masses noted.  Cardiac: Regular rate and rhythm. Harsh outflow murmur. Trace edema.  Respiratory:  Lungs are clear to auscultation bilaterally with normal work of breathing.  GI: Soft and nontender.  MS: No deformity or atrophy. Gait and ROM intact. Skin: Warm and dry. Color is normal.  Neuro:  Strength and sensation are intact and no gross focal deficits noted.  Psych: Alert, appropriate and with normal affect.   LABORATORY DATA:  EKG:  03/14/14  AV pacing rate 64    Lab Results  Component Value Date   WBC 4.9 01/30/2014   HGB 12.0* 01/30/2014   HCT 36.4* 01/30/2014   PLT 179.0 01/30/2014   GLUCOSE 249* 05/16/2014   ALT 19 01/16/2014   AST 20 01/16/2014   NA 138 05/16/2014   K 4.1 05/16/2014   CL 101 05/16/2014   CREATININE 1.11 05/16/2014   BUN 14 05/16/2014   CO2 30 05/16/2014   TSH 0.458 12/20/2014   INR 1.28 01/16/2014    BNP (last 3 results) No results for input(s): BNP in the last 8760 hours.  ProBNP (last 3 results)  Recent Labs  05/16/14 1011  PROBNP 273.0*     Other Studies Reviewed Today:  Echo Study Conclusions from 12/2013  - Left ventricle: The cavity size was normal. Wall thickness was normal. Systolic function was mildly reduced. The estimated ejection fraction was in the range of 45% to 50%. Diffuse hypokinesis. Doppler parameters are consistent with abnormal left ventricular relaxation (grade 1 diastolic dysfunction). - Aortic valve: There was mild regurgitation. - Mitral  valve: Calcified annulus. - Left atrium: The atrium was mildly dilated.   Angiographic Findings from 12/2013:  Left main: 30% distal stenosis.   Left Anterior Descending Artery: 100% proximal occlusion. Mid and distal LAD fills from the patent vein graft. The diagonal fills from the patent vein graft.   Circumflex Artery: 100% proximal occlusion. The first and second OM branches fill from the patent vein graft.   Right Coronary Artery: Large dominant vessel with diffuse 99% stenosis in the proximal, mid and distal vessel. The PDA and PLA fill from the patent vein graft.   Graft Anatomy:  SVG to PDA is patent SVG to OM1/OM2 is patent with 95% stenosis mid body of SVG SVG to LAD is patent SVG to Diagonal is patent  Left Ventricular Angiogram: Deferred.   Impression: 1. Severe triple vessel disease s/p 5 vessel CABG with 5/5 patent bypass grafts. 2. Severe stenosis mid body of SVG to OM 3. Unstable angina 4. Successful PTCA/DES x 1 mid body of SVG to OM1/OM2  Recommendations: Continue ASA and Plavix for one year.        Assessment / Plan: 1. Chronic combined systolic and diastolic HF - stable  Continue current meds    2. 12/2013  PCI/DES to SVG to OM1/OM2 - continue DAPT.  And nitrates He will call if he gets any rest pain   3. Underlying PPM - followed by Dr. Rayann Heman with recent check -more reprogramming  with his rate responsiveness   Will need office f/u   4. Advanced age  DNR  Current medicines are reviewed with the patient today.  The patient does not have concerns  regarding medicines other than what has been noted above.  The following changes have been made:  See above.  Labs/ tests ordered today include:    No orders of the defined types were placed in this encounter.     Disposition:   FU with me  in 6 months.   Patient is agreeable to this plan and will call if any problems develop in the interim.   Jenkins Rouge

## 2015-01-28 ENCOUNTER — Encounter: Payer: Self-pay | Admitting: Internal Medicine

## 2015-01-28 ENCOUNTER — Ambulatory Visit (INDEPENDENT_AMBULATORY_CARE_PROVIDER_SITE_OTHER): Payer: Medicare Other | Admitting: *Deleted

## 2015-01-28 ENCOUNTER — Encounter: Payer: Self-pay | Admitting: Cardiovascular Disease

## 2015-01-28 ENCOUNTER — Ambulatory Visit (INDEPENDENT_AMBULATORY_CARE_PROVIDER_SITE_OTHER): Payer: Medicare Other | Admitting: Cardiovascular Disease

## 2015-01-28 VITALS — BP 130/50 | HR 62 | Ht 65.0 in | Wt 156.4 lb

## 2015-01-28 DIAGNOSIS — I495 Sick sinus syndrome: Secondary | ICD-10-CM

## 2015-01-28 DIAGNOSIS — I2510 Atherosclerotic heart disease of native coronary artery without angina pectoris: Secondary | ICD-10-CM | POA: Diagnosis not present

## 2015-01-28 DIAGNOSIS — I2583 Coronary atherosclerosis due to lipid rich plaque: Secondary | ICD-10-CM

## 2015-01-28 LAB — CUP PACEART INCLINIC DEVICE CHECK
Battery Impedance: 112 Ohm
Battery Remaining Longevity: 116 mo
Brady Statistic AP VP Percent: 100 %
Date Time Interrogation Session: 20170123103221
Implantable Lead Location: 753860
Implantable Lead Model: 5076
Implantable Lead Model: 5092
Lead Channel Impedance Value: 642 Ohm
Lead Channel Pacing Threshold Amplitude: 0.5 V
Lead Channel Pacing Threshold Pulse Width: 0.4 ms
Lead Channel Pacing Threshold Pulse Width: 0.76 ms
Lead Channel Sensing Intrinsic Amplitude: 15.67 mV
Lead Channel Setting Pacing Amplitude: 2 V
Lead Channel Setting Sensing Sensitivity: 4 mV
MDC IDC LEAD IMPLANT DT: 20151208
MDC IDC LEAD IMPLANT DT: 20151208
MDC IDC LEAD LOCATION: 753859
MDC IDC MSMT BATTERY VOLTAGE: 2.78 V
MDC IDC MSMT LEADCHNL RA IMPEDANCE VALUE: 494 Ohm
MDC IDC MSMT LEADCHNL RA PACING THRESHOLD AMPLITUDE: 0.625 V
MDC IDC MSMT LEADCHNL RA SENSING INTR AMPL: 2 mV
MDC IDC MSMT LEADCHNL RV PACING THRESHOLD AMPLITUDE: 1 V
MDC IDC MSMT LEADCHNL RV PACING THRESHOLD AMPLITUDE: 1 V
MDC IDC MSMT LEADCHNL RV PACING THRESHOLD PULSEWIDTH: 0.4 ms
MDC IDC MSMT LEADCHNL RV PACING THRESHOLD PULSEWIDTH: 0.4 ms
MDC IDC SET LEADCHNL RV PACING AMPLITUDE: 2.5 V
MDC IDC SET LEADCHNL RV PACING PULSEWIDTH: 0.4 ms
MDC IDC STAT BRADY AP VS PERCENT: 0 %
MDC IDC STAT BRADY AS VP PERCENT: 0 %
MDC IDC STAT BRADY AS VS PERCENT: 0 %

## 2015-01-28 NOTE — Progress Notes (Signed)
Pacemaker check in clinic. Normal device function. Thresholds, sensing, impedances consistent with previous measurements. Device programmed to maximize longevity. (1) mode switch episode (<0.1%)---<30 sec, no EGM. No high ventricular rates noted. Device programmed at appropriate safety margins. Histogram distribution appropriate for patient activity level. Device programmed to optimize intrinsic conduction. Estimated longevity 9.5 years. Patient will follow up with AS in 3 months.

## 2015-01-28 NOTE — Patient Instructions (Addendum)
Medication Instructions:  Your physician recommends that you continue on your current medications as directed. Please refer to the Current Medication list given to you today.  Labwork: NONE  Testing/Procedures: NONE  Follow-Up: Your physician wants you to follow-up in: 3 months with [name].  Your physician recommends that you schedule a follow-up appointment in: 3 months with Carlynn Herald NP   If you need a refill on your cardiac medications before your next appointment, please call your pharmacy.

## 2015-01-31 ENCOUNTER — Telehealth: Payer: Self-pay | Admitting: Cardiovascular Disease

## 2015-01-31 DIAGNOSIS — I5043 Acute on chronic combined systolic (congestive) and diastolic (congestive) heart failure: Secondary | ICD-10-CM

## 2015-01-31 MED ORDER — AMLODIPINE BESYLATE 5 MG PO TABS: 5.0000 mg | ORAL_TABLET | Freq: Every day | ORAL | Status: DC

## 2015-01-31 MED ORDER — CLOPIDOGREL BISULFATE 75 MG PO TABS: 75.0000 mg | ORAL_TABLET | Freq: Every day | ORAL | Status: DC

## 2015-01-31 MED ORDER — CARVEDILOL 12.5 MG PO TABS: 12.5000 mg | ORAL_TABLET | Freq: Two times a day (BID) | ORAL | Status: DC

## 2015-01-31 MED ORDER — ATORVASTATIN CALCIUM 10 MG PO TABS: 10.0000 mg | ORAL_TABLET | Freq: Every day | ORAL | Status: DC

## 2015-01-31 MED ORDER — NITROGLYCERIN 0.4 MG SL SUBL: SUBLINGUAL_TABLET | SUBLINGUAL | Status: DC

## 2015-01-31 MED ORDER — HYDRALAZINE HCL 25 MG PO TABS: 75.0000 mg | ORAL_TABLET | Freq: Three times a day (TID) | ORAL | Status: DC

## 2015-01-31 MED ORDER — FUROSEMIDE 80 MG PO TABS: 80.0000 mg | ORAL_TABLET | Freq: Every day | ORAL | Status: DC

## 2015-01-31 MED ORDER — AMIODARONE HCL 200 MG PO TABS: 200.0000 mg | ORAL_TABLET | Freq: Every day | ORAL | Status: DC

## 2015-01-31 MED ORDER — ISOSORBIDE MONONITRATE ER 30 MG PO TB24: 30.0000 mg | ORAL_TABLET | Freq: Every day | ORAL | Status: DC

## 2015-01-31 NOTE — Telephone Encounter (Signed)
Called [facility] back- Spoke w/ Sharee Pimple- she did not have specific medications but was told by her team lead RN- that every medication prescribed by our office needs to be changed to [facility]. Please advise

## 2015-01-31 NOTE — Telephone Encounter (Signed)
NEw Message  RN from [facility] calling to switch pharmacies for pt from [facility] to [facility] (mail). Rn did not know the specific meds but stated that all meds from our office that pt routinely takes should be changed to [facility]. .please advise    [email or web address] Phone [phone] Fax [phone]

## 2015-01-31 NOTE — Telephone Encounter (Signed)
Pt's Rx sent to pt's pharmacy as requested. Confirmation received.  °

## 2015-01-31 NOTE — Telephone Encounter (Signed)
Pt's pharmacy changed, I need that name of the medications. Please advise

## 2015-04-04 ENCOUNTER — Other Ambulatory Visit: Payer: Self-pay | Admitting: *Deleted

## 2015-04-04 ENCOUNTER — Telehealth: Payer: Self-pay | Admitting: Cardiovascular Disease

## 2015-04-04 DIAGNOSIS — I5043 Acute on chronic combined systolic (congestive) and diastolic (congestive) heart failure: Secondary | ICD-10-CM

## 2015-04-04 MED ORDER — AMIODARONE HCL 200 MG PO TABS: 200.0000 mg | ORAL_TABLET | Freq: Every day | ORAL | Status: DC

## 2015-04-04 MED ORDER — NITROGLYCERIN 0.4 MG SL SUBL: SUBLINGUAL_TABLET | SUBLINGUAL | Status: DC

## 2015-04-04 MED ORDER — CLOPIDOGREL BISULFATE 75 MG PO TABS: 75.0000 mg | ORAL_TABLET | Freq: Every day | ORAL | Status: DC

## 2015-04-04 MED ORDER — CARVEDILOL 12.5 MG PO TABS: 12.5000 mg | ORAL_TABLET | Freq: Two times a day (BID) | ORAL | Status: DC

## 2015-04-04 MED ORDER — HYDRALAZINE HCL 25 MG PO TABS: 75.0000 mg | ORAL_TABLET | Freq: Three times a day (TID) | ORAL | Status: DC

## 2015-04-04 MED ORDER — ATORVASTATIN CALCIUM 10 MG PO TABS: 10.0000 mg | ORAL_TABLET | Freq: Every day | ORAL | Status: DC

## 2015-04-04 MED ORDER — AMLODIPINE BESYLATE 5 MG PO TABS: 5.0000 mg | ORAL_TABLET | Freq: Every day | ORAL | Status: DC

## 2015-04-04 NOTE — Telephone Encounter (Signed)
New message     Patient calling has a new pharmacy - need all medication sent over to mail order .     *STAT* If patient is at the pharmacy, call can be transferred to refill team.   1. Which medications need to be refilled? (please list name of each medication and dose if known) all medication that prescribe by his cardiologist   2. Which pharmacy/location (including street and city if local pharmacy) is medication to be sent to? [facility] mail order phone #   - Pt will call back with phone #   3. Do they need a 30 day or 90 day supply? 90 days supply

## 2015-04-05 ENCOUNTER — Ambulatory Visit (INDEPENDENT_AMBULATORY_CARE_PROVIDER_SITE_OTHER): Payer: Medicare Other | Admitting: Nurse Practitioner

## 2015-04-05 ENCOUNTER — Encounter: Payer: Self-pay | Admitting: Nurse Practitioner

## 2015-04-05 ENCOUNTER — Encounter: Payer: Self-pay | Admitting: Internal Medicine

## 2015-04-05 VITALS — BP 100/42 | HR 80 | Ht 67.0 in | Wt 155.4 lb

## 2015-04-05 DIAGNOSIS — I10 Essential (primary) hypertension: Secondary | ICD-10-CM

## 2015-04-05 DIAGNOSIS — I442 Atrioventricular block, complete: Secondary | ICD-10-CM | POA: Diagnosis not present

## 2015-04-05 LAB — CUP PACEART INCLINIC DEVICE CHECK
Implantable Lead Implant Date: 20151208
Implantable Lead Location: 753859
Implantable Lead Location: 753860
Implantable Lead Model: 5076
Lead Channel Setting Pacing Amplitude: 2 V
Lead Channel Setting Pacing Pulse Width: 0.4 ms
MDC IDC LEAD IMPLANT DT: 20151208
MDC IDC SESS DTM: 20170331140524
MDC IDC SET LEADCHNL RV PACING AMPLITUDE: 2.5 V
MDC IDC SET LEADCHNL RV SENSING SENSITIVITY: 4 mV

## 2015-04-05 NOTE — Progress Notes (Signed)
Electrophysiology Office Note Date: 04/05/2015  ID:  Evan [name], DOB 01-18-1923, MRN [id number]  PCP: Horatio Pel, MD Primary Cardiologist: Johnsie Cancel Electrophysiologist: Allred  CC: Pacemaker follow-up  Evan [name] is a 82 y.o. male seen today for Dr Rayann Heman.  He presents today for routine electrophysiology followup.  Since last being seen in our clinic, the patient reports doing reasonably well.  He notices chest pain with exertion when his HR is >90bpm.  He denies palpitations, dyspnea, PND, orthopnea, nausea, vomiting, dizziness, syncope, edema, weight gain, or early satiety.  Device History: MDT dual chamber PPM implanted 2015 for symptomatic bradycardia    Past Medical History  Diagnosis Date  . HYPERTENSION   . HYPERCHOLESTEROLEMIA   . CAD   . TRANSIENT ISCHEMIC ATTACKS, HX OF   . NEPHROLITHIASIS, HX OF   . GERD   . Trifascicular block     a. s/p MDT ADDRL1 pacemaker Dr Rayann Heman  . Syncope   . ALLERGIC RHINITIS   . Paroxysmal atrial fibrillation (HCC)   . Atrial tachycardia ([place])   . CHF (congestive heart failure) ([place])   . Anginal pain ([place])   . DM     type 2  . Arthritis   . Presence of permanent cardiac pacemaker 12/12/2013  . Diarrhea   . Stable angina (HCC)   . Thyroid disease   . Iron deficiency anemia   . H/O: GI bleed   . GERD (gastroesophageal reflux disease)   . Urinary frequency   . Weakness of left leg   . AC joint dislocation   . Rib fracture   . Nephrolithiasis   . Allergic rhinitis   . Vertigo   . BPH (benign prostatic hyperplasia)    Past Surgical History  Procedure Laterality Date  . Ptca  1980's  . Coronary artery bypass graft  1996  . Back surgery  2000'S  . Av fistula repair    . 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 Coronary stent placement  12/11/13    SVG-OM DES  . Left heart catheterization with coronary/graft angiogram N/A 12/11/2013    Procedure: LEFT HEART CATHETERIZATION  WITH Beatrix Fetters;  Surgeon: Burnell Blanks, MD;  Location: [facility] C[facility] LAB;  Service: Cardiovascular;  Laterality: N/A;  . Cardiac catheterization  12/11/2013    Procedure: CORONARY STENT INTERVENTION;  Surgeon: Burnell Blanks, MD;  Location: [facility] CATH LAB;  Service: Cardiovascular;;  DES 3.5 x 15 Resolute to seq SVG of OM1/OM2   . Permanent pacemaker insertion N/A 12/12/2013    MDT ADDRL1 pacemaker implnated by Dr Rayann Heman  . Insert / replace / remove pacemaker      Current Outpatient Prescriptions  Medication Sig Dispense Refill  . amiodarone (PACERONE) 200 MG tablet Take 1 tablet (200 mg total) by mouth daily. 90 tablet 3  . amLODipine (NORVASC) 5 MG tablet Take 1 tablet (5 mg total) by mouth daily. 90 tablet 3  . aspirin 81 MG chewable tablet Chew 1 tablet (81 mg total) by mouth daily.    Marland Kitchen atorvastatin (LIPITOR) 10 MG tablet Take 1 tablet (10 mg total) by mouth daily. 30 tablet 3  . carvedilol (COREG) 12.5 MG tablet Take 1 tablet (12.5 mg total) by mouth 2 (two) times daily with a meal. 180 tablet 3  . clopidogrel (PLAVIX) 75 MG tablet Take 1 tablet (75 mg total) by mouth daily with breakfast. 30 tablet 3  . Cyanocobalamin (VITAMIN B-12 PO) Take  1 tablet by mouth 3 (three) times a week.    . finasteride (PROSCAR) 5 MG tablet Take 5 mg by mouth daily.    . furosemide (LASIX) 80 MG tablet Take 1 tablet (80 mg total) by mouth daily. 30 tablet 3  . isosorbide mononitrate (IMDUR) 30 MG 24 hr tablet Take 1 tablet (30 mg total) by mouth daily. 30 tablet 3  . levothyroxine (SYNTHROID, LEVOTHROID) 112 MCG tablet Take 112 mcg by mouth daily before breakfast.    . metFORMIN (GLUCOPHAGE) 500 MG tablet Take 1,000 mg by mouth daily.  10  . nitroGLYCERIN (NITROSTAT) 0.4 MG SL tablet PLACE 1 TABLET (0.4 MG TOTAL) UNDER THE TONGUE EVERY 5 (FIVE) MINUTES AS NEEDED FOR CHEST PAIN. 25 tablet 3  . saxagliptin HCl (ONGLYZA) 5 MG TABS tablet Take 5 mg by mouth daily.     . trandolapril  (MAVIK) 2 MG tablet Take 2 mg by mouth daily.    . vitamin C (ASCORBIC ACID) 500 MG tablet Take 500 mg by mouth daily.       No current facility-administered medications for this visit.    Allergies:   Meperidine hcl; Demerol; Heparin; and Metformin and related   Social History: Social History   Social History  . Marital Status: Married    Spouse Name: N/A  . Number of Children: N/A  . Years of Education: N/A   Occupational History  . Not on file.   Social History Main Topics  . Smoking status: Former Smoker    Quit date: 01/06/1956  . Smokeless tobacco: Never Used  . Alcohol Use: No  . Drug Use: No  . Sexual Activity: Not Currently   Other Topics Concern  . Not on file   Social History Narrative   Pt is married and lives with his wife.  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      Family History: Family History  Problem Relation Age of Onset  . Diabetes Mother   . Coronary artery disease Father      Review of Systems: All other systems reviewed and are otherwise negative except as noted above.   Physical Exam: VS:  BP 100/42 mmHg  Pulse 80  Ht 5\' 7"  (1.702 m)  Wt 155 lb 6.4 oz (70.489 kg)  BMI 24.33 kg/m2 , BMI Body mass index is 24.33 kg/(m^2).  GEN- The patient is elderly and kyphotic appearing, alert and oriented x 3 today.   HEENT: normocephalic, atraumatic; sclera clear, conjunctiva pink; hearing intact; oropharynx clear; neck supple  Lungs- Clear to ausculation bilaterally, normal work of breathing.  No wheezes, rales, rhonchi Heart- Regular rate and rhythm, 2/6 SEM GI- soft, non-tender, non-distended, bowel sounds present  Extremities- no clubbing, cyanosis, or edema; DP/PT/radial pulses 2+ bilaterally MS- no significant deformity or atrophy Skin- warm and dry, no rash or lesion; PPM pocket well healed Psych- euthymic mood, full affect Neuro- strength and sensation are intact  PPM Interrogation-  reviewed in detail today,  See PACEART report  EKG:  EKG is ordered today. The ekg ordered today shows AV pacing  Recent Labs: 05/16/2014: BUN 14; Creatinine, Ser 1.11; Potassium 4.1; Pro B Natriuretic peptide (BNP) 273.0*; Sodium 138 12/20/2014: TSH 0.458   Wt Readings from Last 3 Encounters:  04/05/15 155 lb 6.4 oz (70.489 kg)  01/28/15 156 lb 6.4 oz (70.943 kg)  08/01/14 165 lb 4 oz (74.957 kg)     Other studies Reviewed:  Additional studies/ records that were reviewed today include: Dr Johnsie Cancel and Dr Jackalyn Lombard office notes   Assessment and Plan:  1.  Complete heart block  Normal PPM function See Pace Art report Chest tightness with HR >90bpm while walking. Device reprogrammed today to a MTR of 90bpm.  Pt ambulated post change without chest pain.   2.  HTN Stable No change required today    Current medicines are reviewed at length with the patient today.   The patient does not have concerns regarding his medicines.  The following changes were made today:  none  Labs/ tests ordered today include: none  Orders Placed This Encounter  Procedures  . EKG 12-Lead     Disposition:   Follow up with Dr Johnsie Cancel as scheduled, Dr Rayann Heman 1 year      Signed, Chanetta Marshall, NP 04/05/2015 2:01 PM  [facility] [address] [place]  [address] [phone] (office) [phone] (fax)

## 2015-04-05 NOTE — Patient Instructions (Addendum)
Medication Instructions:   Your physician recommends that you continue on your current medications as directed. Please refer to the Current Medication list given to you today.    If you need a refill on your cardiac medications before your next appointment, please call your pharmacy.  Labwork:  NONE ORDER TODAY     Testing/Procedures:  NONE ORDER TODAY    Follow-Up:  Your physician wants you to follow-up in: [place] will receive a reminder letter in the mail two months in advance. If you don't receive a letter, please call our office to schedule the follow-up appointment.    Any Other Special Instructions Will Be Listed Below (If Applicable).

## 2015-05-10 ENCOUNTER — Encounter: Payer: Medicare Other | Admitting: Cardiovascular Disease

## 2015-05-10 NOTE — Progress Notes (Signed)
Patient ID: Evan [name], male   DOB: 15-Mar-1923, 82 y.o.   MRN: [id number]     CARDIOLOGY OFFICE NOTE  Date:  05/10/2015    Evan [name] Date of Birth: 05/01/1923 Medical Record [id number]  PCP:  Evan Pel, MD  Cardiologist:  Evan [name]    Chief Complaint  Patient presents with  . Coronary Artery Disease     History of Present Illness: Evan [name] is a 82 y.o. male who presents today for f/u SSS and CAD  Prior CABG back in 1996. Admitted in December of 2015 with syncope/bradycardia and chest pain - was cathed and had DES to the SVG to the OM - had 5/5 patent bypass grafts. On DAPT for one year with Plavix and aspirin. Cath was followed by PPM implant per Dr. Rayann [name] - dual chamber device. Has chronic weakness in the legs/atrophy and has had frequent falls. He is on chronic amiodarone for PAF. Other issues include aortic stenosis, DM, HLD, and HTN. Has some mild LV dysfunction per recent echo with grade 1 diastolic dysfunction.     Last visit with PA lower pacer rate limit increased to 60 bpm   He is a DNR. He was discharged back to nursing facility on O2 (O2 sats 87 with ambulation). ARB was stopped secondary to renal insufficiency. Arterial dopplers done showed distal Lt poplitial disease of 50-99%-no intervention planned.   Synthroid dose increased by PA Lab Results  Component Value Date   TSH 0.458 12/20/2014     Angina stable with exertion has not needed to take nitro Unable to due trans telephonic monitoring at [facility] living on [address]  Seen by EP PA 04/05/15 pacer reprogrammed    Past Medical History  Diagnosis Date  . HYPERTENSION   . HYPERCHOLESTEROLEMIA   . CAD   . TRANSIENT ISCHEMIC ATTACKS, HX OF   . NEPHROLITHIASIS, HX OF   . GERD   . Trifascicular block     a. s/p MDT ADDRL1 pacemaker Dr Evan [name]  . Syncope   . ALLERGIC RHINITIS   . Paroxysmal atrial fibrillation (HCC)   . Atrial tachycardia ([place])   . CHF (congestive heart  failure) ([place])   . Anginal pain ([place])   . DM     type 2  . Arthritis   . Presence of permanent cardiac pacemaker 12/12/2013  . Diarrhea   . Stable angina (HCC)   . Thyroid disease   . Iron deficiency anemia   . H/O: GI bleed   . GERD (gastroesophageal reflux disease)   . Urinary frequency   . Weakness of left leg   . AC joint dislocation   . Rib fracture   . Nephrolithiasis   . Allergic rhinitis   . Vertigo   . BPH (benign prostatic hyperplasia)     Past Surgical History  Procedure Laterality Date  . Ptca  1980's  . Coronary artery bypass graft  1996  . Back surgery  2000'S  . Av fistula repair    . 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 Coronary stent placement  12/11/13    SVG-OM DES  . Left heart catheterization with coronary/graft angiogram N/A 12/11/2013    Procedure: LEFT HEART CATHETERIZATION WITH Evan [name];  Surgeon: Evan Blanks, MD;  Location: [facility] CATH LAB;  Service: Cardiovascular;  Laterality: N/A;  . Cardiac catheterization  12/11/2013    Procedure: CORONARY STENT INTERVENTION;  Surgeon: Evan Blanks, MD;  Location: [facility] CATH LAB;  Service: Cardiovascular;;  DES 3.5 x 15 Resolute to seq SVG of OM1/OM2   . Permanent pacemaker insertion N/A 12/12/2013    MDT ADDRL1 pacemaker implnated by Dr Evan [name]  . Insert / replace / remove pacemaker       Medications: Current Outpatient Prescriptions  Medication Sig Dispense Refill  . amiodarone (PACERONE) 200 MG tablet Take 1 tablet (200 mg total) by mouth daily. 90 tablet 3  . amLODipine (NORVASC) 5 MG tablet Take 1 tablet (5 mg total) by mouth daily. 90 tablet 3  . aspirin 81 MG chewable tablet Chew 1 tablet (81 mg total) by mouth daily.    Marland Kitchen atorvastatin (LIPITOR) 10 MG tablet Take 1 tablet (10 mg total) by mouth daily. 30 tablet 3  . carvedilol (COREG) 12.5 MG tablet Take 1 tablet (12.5 mg total) by mouth 2 (two) times daily with a meal. 180 tablet 3  .  clopidogrel (PLAVIX) 75 MG tablet Take 1 tablet (75 mg total) by mouth daily with breakfast. 30 tablet 3  . Cyanocobalamin (VITAMIN B-12 PO) Take 1 tablet by mouth 3 (three) times a week.    . finasteride (PROSCAR) 5 MG tablet Take 5 mg by mouth daily.    . furosemide (LASIX) 80 MG tablet Take 1 tablet (80 mg total) by mouth daily. 30 tablet 3  . isosorbide mononitrate (IMDUR) 30 MG 24 hr tablet Take 1 tablet (30 mg total) by mouth daily. 30 tablet 3  . levothyroxine (SYNTHROID, LEVOTHROID) 112 MCG tablet Take 112 mcg by mouth daily before breakfast.    . metFORMIN (GLUCOPHAGE) 500 MG tablet Take 1,000 mg by mouth daily.  10  . nitroGLYCERIN (NITROSTAT) 0.4 MG SL tablet PLACE 1 TABLET (0.4 MG TOTAL) UNDER THE TONGUE EVERY 5 (FIVE) MINUTES AS NEEDED FOR CHEST PAIN. 25 tablet 3  . saxagliptin HCl (ONGLYZA) 5 MG TABS tablet Take 5 mg by mouth daily.     . trandolapril (MAVIK) 2 MG tablet Take 2 mg by mouth daily.    . vitamin C (ASCORBIC ACID) 500 MG tablet Take 500 mg by mouth daily.       No current facility-administered medications for this visit.    Allergies: Allergies  Allergen Reactions  . Meperidine Hcl     REACTION: Hypotension  . Demerol Other (See Comments)    hypotension  . Heparin Other (See Comments)    hypotension  . Metformin And Related Diarrhea    Social History: The patient  reports that he quit smoking about 59 years ago. He has never used smokeless tobacco. He reports that he does not drink alcohol or use illicit drugs.   Family History: The patient's family history includes Coronary artery disease in his father; Diabetes in his mother.   Review of Systems: Please see the history of present illness.   Otherwise, the review of systems is positive for chest pain.   All other systems are reviewed and negative.   Physical Exam: VS:  There were no vitals taken for this visit. Marland Kitchen  BMI There is no weight on file to calculate BMI.  Wt Readings from Last 3 Encounters:    04/05/15 70.489 kg (155 lb 6.4 oz)  01/28/15 70.943 kg (156 lb 6.4 oz)  08/01/14 74.957 kg (165 lb 4 oz)    General: Pleasant, elderly male. He is in no acute distress. His weight is up 9 pounds. HEENT: Normal but with missing teeth. Neck: Supple, no JVD, carotid bruits, or  masses noted.  Cardiac: Regular rate and rhythm. Harsh outflow murmur. Trace edema.  Respiratory:  Lungs are clear to auscultation bilaterally with normal work of breathing.  GI: Soft and nontender.  MS: No deformity or atrophy. Gait and ROM intact. Skin: Warm and dry. Color is normal.  Neuro:  Strength and sensation are intact and no gross focal deficits noted.  Psych: Alert, appropriate and with normal affect.   LABORATORY DATA:  EKG:  03/14/14  AV pacing rate 64    Lab Results  Component Value Date   WBC 4.9 01/30/2014   HGB 12.0* 01/30/2014   HCT 36.4* 01/30/2014   PLT 179.0 01/30/2014   GLUCOSE 249* 05/16/2014   ALT 19 01/16/2014   AST 20 01/16/2014   NA 138 05/16/2014   K 4.1 05/16/2014   CL 101 05/16/2014   CREATININE 1.11 05/16/2014   BUN 14 05/16/2014   CO2 30 05/16/2014   TSH 0.458 12/20/2014   INR 1.28 01/16/2014    BNP (last 3 results) No results for input(s): BNP in the last 8760 hours.  ProBNP (last 3 results)  Recent Labs  05/16/14 1011  PROBNP 273.0*     Other Studies Reviewed Today:  Echo Study Conclusions from 12/2013  - Left ventricle: The cavity size was normal. Wall thickness was normal. Systolic function was mildly reduced. The estimated ejection fraction was in the range of 45% to 50%. Diffuse hypokinesis. Doppler parameters are consistent with abnormal left ventricular relaxation (grade 1 diastolic dysfunction). - Aortic valve: There was mild regurgitation. - Mitral valve: Calcified annulus. - Left atrium: The atrium was mildly dilated.   Angiographic Findings from 12/2013:  Left main: 30% distal stenosis.   Left Anterior Descending Artery: 100%  proximal occlusion. Mid and distal LAD fills from the patent vein graft. The diagonal fills from the patent vein graft.   Circumflex Artery: 100% proximal occlusion. The first and second OM branches fill from the patent vein graft.   Right Coronary Artery: Large dominant vessel with diffuse 99% stenosis in the proximal, mid and distal vessel. The PDA and PLA fill from the patent vein graft.   Graft Anatomy:  SVG to PDA is patent SVG to OM1/OM2 is patent with 95% stenosis mid body of SVG SVG to LAD is patent SVG to Diagonal is patent  Left Ventricular Angiogram: Deferred.   Impression: 1. Severe triple vessel disease s/p 5 vessel CABG with 5/5 patent bypass grafts. 2. Severe stenosis mid body of SVG to OM 3. Unstable angina 4. Successful PTCA/DES x 1 mid body of SVG to OM1/OM2  Recommendations: Continue ASA and Plavix for one year.        Assessment / Plan: 1. Chronic combined systolic and diastolic HF - stable  Continue current meds    2. 12/2013  PCI/DES to SVG to OM1/OM2 - continue DAPT.  And nitrates He will call if he gets any rest pain   3. Underlying PPM - followed by Dr. Rayann [name] with recent check -more reprogramming  with his rate responsiveness      4. Advanced age  DNR  Current medicines are reviewed with the patient today.  The patient does not have concerns regarding medicines other than what has been noted above.  The following changes have been made:  See above.  Labs/ tests ordered today include:    No orders of the defined types were placed in this encounter.     Disposition:   FU with me  in 6 months.  Patient is agreeable to this plan and will call if any problems develop in the interim.   Evan [name]         This encounter was created in error - please disregard.

## 2015-05-21 IMAGING — CR DG CHEST 2V
2 series · 2 of 2 positions shown · non-contrast
Comparison: PA and lateral chest of December 13, 2013

CLINICAL DATA: Shortness of breath, history of coronary artery
disease, CABG. Pacemaker replacement in December 2013

EXAM:
CHEST  2 VIEW

[chest lat]
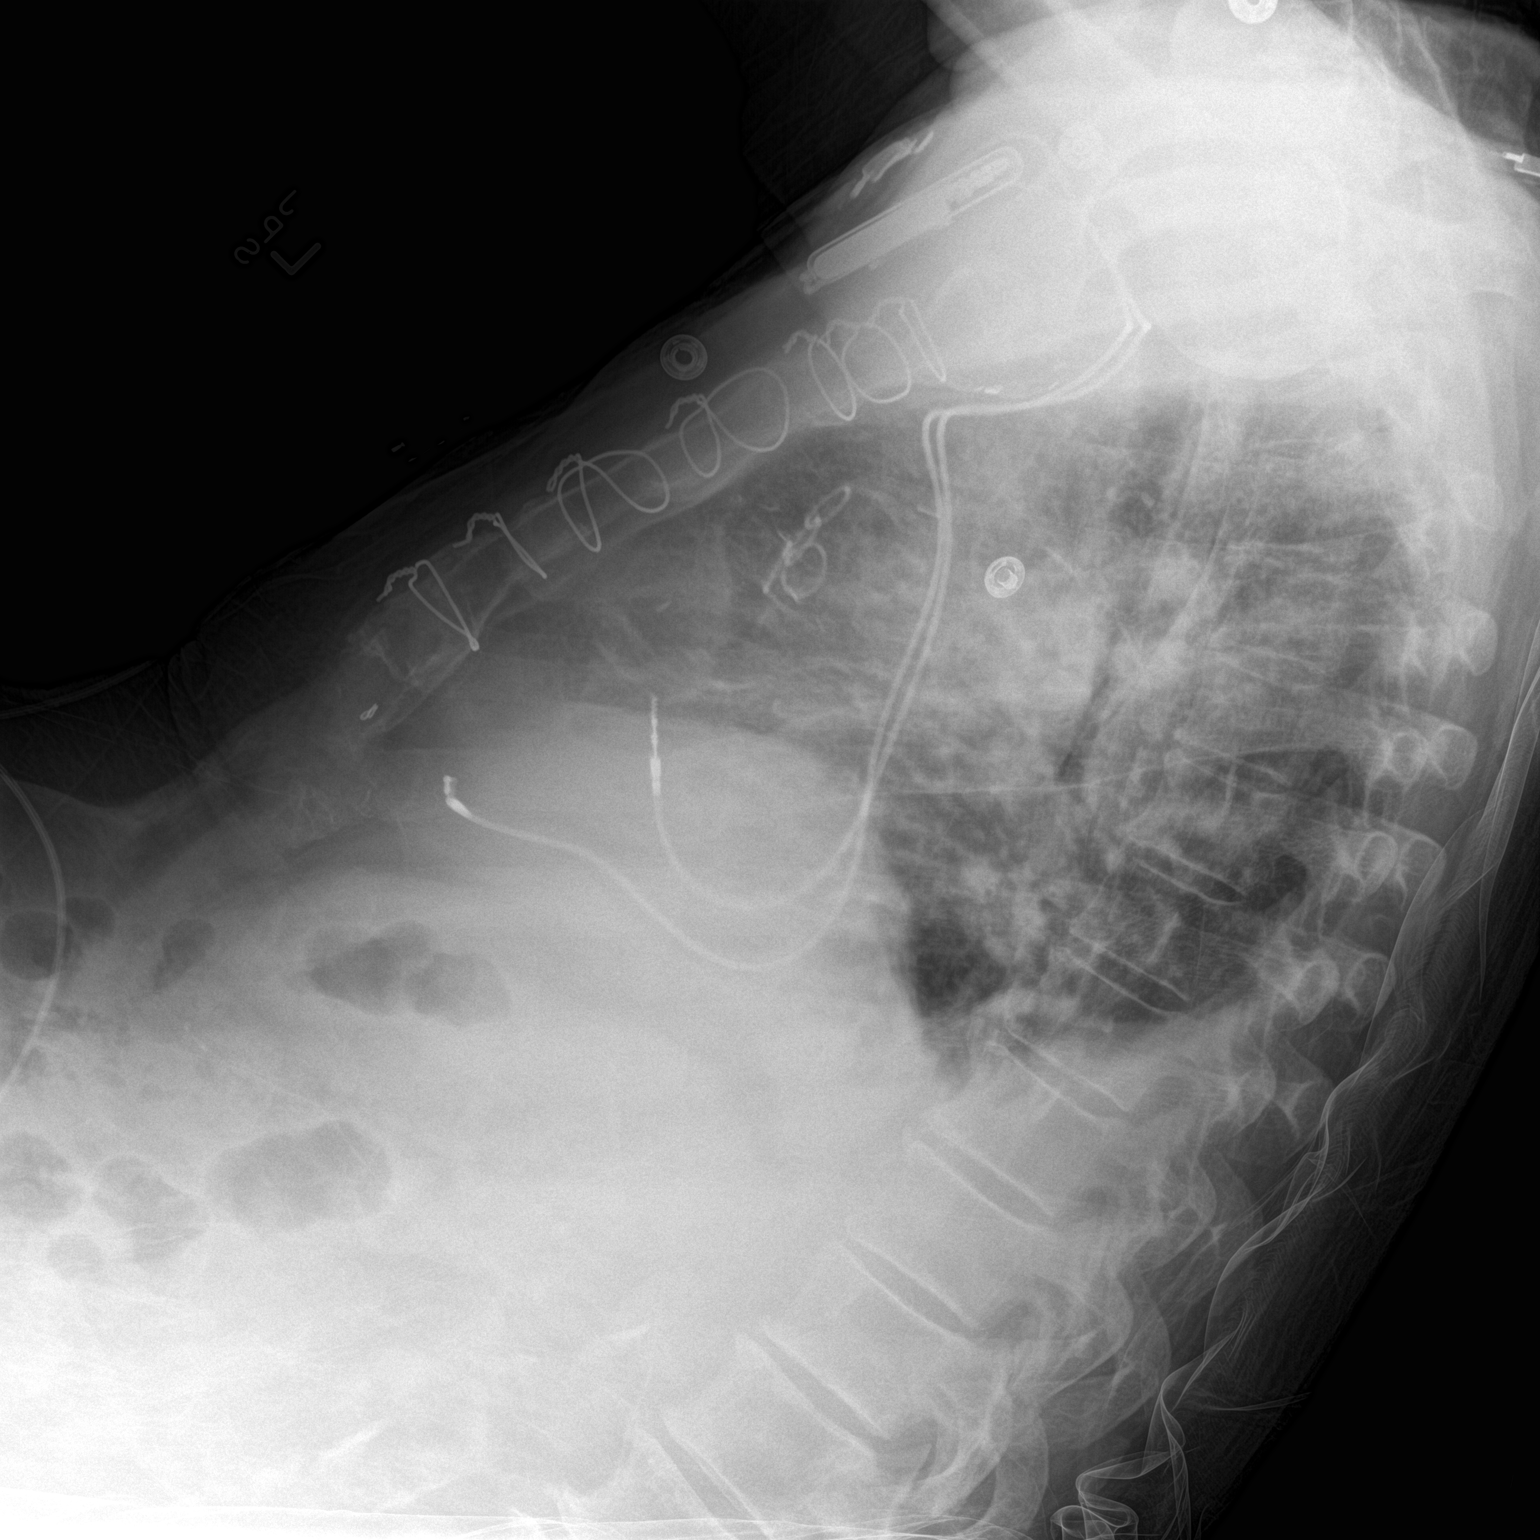

[chest ap]
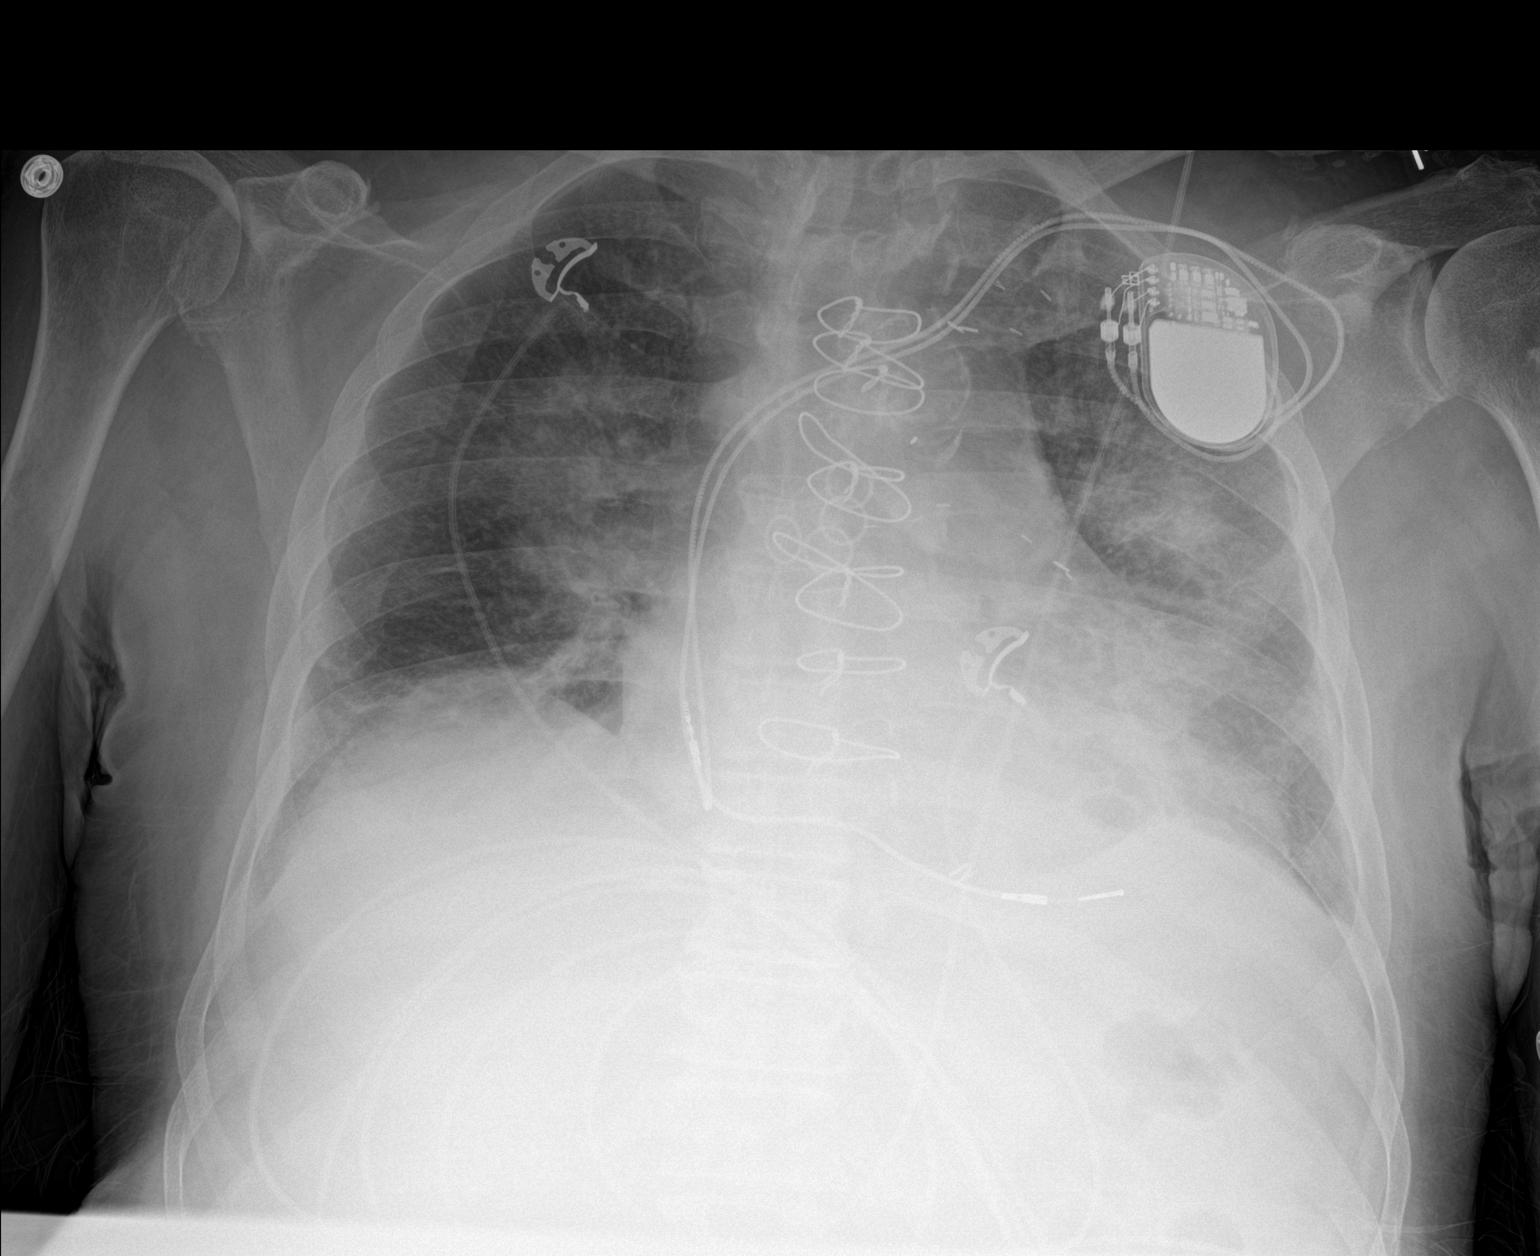

[2 of 2 positions shown; findings below may reference images not displayed]

FINDINGS: There are fluffy bilateral alveolar infiltrates. The lung volumes
are low. The retrocardiac region is dense on the left. There are
bilateral pleural effusions layering posteriorly. The cardiac
silhouette is mildly enlarged. The permanent pacemaker is in
appropriate position radiographically. There are post CABG changes.
IMPRESSION: Congestive heart failure with bilateral pulmonary interstitial and
alveolar edema with bilateral pleural effusions.

## 2015-06-13 ENCOUNTER — Encounter: Payer: Self-pay | Admitting: Cardiovascular Disease

## 2015-06-13 ENCOUNTER — Ambulatory Visit (INDEPENDENT_AMBULATORY_CARE_PROVIDER_SITE_OTHER): Payer: Medicare Other | Admitting: Cardiovascular Disease

## 2015-06-13 VITALS — BP 130/50 | HR 64 | Ht 67.0 in | Wt 154.8 lb

## 2015-06-13 DIAGNOSIS — I5043 Acute on chronic combined systolic (congestive) and diastolic (congestive) heart failure: Secondary | ICD-10-CM | POA: Diagnosis not present

## 2015-06-13 DIAGNOSIS — I442 Atrioventricular block, complete: Secondary | ICD-10-CM | POA: Diagnosis not present

## 2015-06-13 DIAGNOSIS — I10 Essential (primary) hypertension: Secondary | ICD-10-CM

## 2015-06-13 MED ORDER — ISOSORBIDE MONONITRATE ER 30 MG PO TB24: 30.0000 mg | ORAL_TABLET | Freq: Every day | ORAL | Status: DC

## 2015-06-13 NOTE — Progress Notes (Signed)
Patient ID: Evan [name], male   DOB: November 01, 1923, 82 y.o.   MRN: [id number]     CARDIOLOGY OFFICE NOTE  Date:  06/13/2015    Evan [name] Date of Birth: [birth year] Medical Record [id number]  PCP:  Evan Pel, MD  Cardiologist:  Evan [name]    Chief Complaint  Patient presents with  . Complete Heart Block    chest pain when walks to fast, per pt  . Hypertension     History of Present Illness: Evan [name] is a 82 y.o. male who presents today for f/u SSS and CAD  Prior CABG back in 1996. Admitted in December of 2015 with syncope/bradycardia and chest pain - was cathed and had DES to the SVG to the OM - had 5/5 patent bypass grafts. On DAPT for one year with Plavix and aspirin. Cath was followed by PPM implant per Dr. Rayann [name] - dual chamber device. Has chronic weakness in the legs/atrophy and has had frequent falls. He is on chronic amiodarone for PAF. Other issues include aortic stenosis, DM, HLD, and HTN. Has some mild LV dysfunction per recent echo with grade 1 diastolic dysfunction.      He is a DNR. He was discharged back to nursing facility on O2 (O2 sats 87 with ambulation). ARB was stopped secondary to renal insufficiency. Arterial dopplers done showed distal Lt poplitial disease of 50-99%-no intervention planned.   Synthroid dose increased by PA Lab Results  Component Value Date   TSH 0.458 12/20/2014    Angina stable with exertion has not needed to take nitro Unable to due trans telephonic monitoring at [facility] on [address] able to do 2 cross word puzzles / day Some insomnia melatonin helps a bit    Past Medical History  Diagnosis Date  . HYPERTENSION   . HYPERCHOLESTEROLEMIA   . CAD   . TRANSIENT ISCHEMIC ATTACKS, HX OF   . NEPHROLITHIASIS, HX OF   . GERD   . Trifascicular block     a. s/p MDT ADDRL1 pacemaker Dr Evan [name]  . Syncope   . ALLERGIC RHINITIS   . Paroxysmal atrial fibrillation (HCC)   . Atrial tachycardia  ([place])   . CHF (congestive heart failure) ([place])   . Anginal pain ([place])   . DM     type 2  . Arthritis   . Presence of permanent cardiac pacemaker 12/12/2013  . Diarrhea   . Stable angina (HCC)   . Thyroid disease   . Iron deficiency anemia   . H/O: GI bleed   . GERD (gastroesophageal reflux disease)   . Urinary frequency   . Weakness of left leg   . AC joint dislocation   . Rib fracture   . Nephrolithiasis   . Allergic rhinitis   . Vertigo   . BPH (benign prostatic hyperplasia)     Past Surgical History  Procedure Laterality Date  . Ptca  1980's  . Coronary artery bypass graft  1996  . Back surgery  2000'S  . Av fistula repair    . 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 Coronary stent placement  12/11/13    SVG-OM DES  . Left heart catheterization with coronary/graft angiogram N/A 12/11/2013    Procedure: LEFT HEART CATHETERIZATION WITH Beatrix Fetters;  Surgeon: Burnell Blanks, MD;  Location: [facility] CATH LAB;  Service: Cardiovascular;  Laterality: N/A;  . Cardiac catheterization  12/11/2013    Procedure: CORONARY  STENT INTERVENTION;  Surgeon: Burnell Blanks, MD;  Location: [facility] CATH LAB;  Service: Cardiovascular;;  DES 3.5 x 15 Resolute to seq SVG of OM1/OM2   . Permanent pacemaker insertion N/A 12/12/2013    MDT ADDRL1 pacemaker implnated by Dr Evan [name]  . Insert / replace / remove pacemaker       Medications: Current Outpatient Prescriptions  Medication Sig Dispense Refill  . amiodarone (PACERONE) 200 MG tablet Take 1 tablet (200 mg total) by mouth daily. 90 tablet 3  . amLODipine (NORVASC) 5 MG tablet Take 1 tablet (5 mg total) by mouth daily. 90 tablet 3  . aspirin 81 MG chewable tablet Chew 1 tablet (81 mg total) by mouth daily.    Marland Kitchen atorvastatin (LIPITOR) 10 MG tablet Take 1 tablet (10 mg total) by mouth daily. 30 tablet 3  . carvedilol (COREG) 12.5 MG tablet Take 1 tablet (12.5 mg total) by mouth 2 (two) times daily  with a meal. 180 tablet 3  . clopidogrel (PLAVIX) 75 MG tablet Take 1 tablet (75 mg total) by mouth daily with breakfast. 30 tablet 3  . Cyanocobalamin (VITAMIN B-12 PO) Take 1 tablet by mouth 3 (three) times a week.    . finasteride (PROSCAR) 5 MG tablet Take 5 mg by mouth daily.    . furosemide (LASIX) 80 MG tablet Take 1 tablet (80 mg total) by mouth daily. 30 tablet 3  . isosorbide mononitrate (IMDUR) 30 MG 24 hr tablet Take 1 tablet (30 mg total) by mouth daily. 90 tablet 3  . levothyroxine (SYNTHROID, LEVOTHROID) 112 MCG tablet Take 112 mcg by mouth daily before breakfast.    . metFORMIN (GLUCOPHAGE) 500 MG tablet Take 1,000 mg by mouth daily.  10  . nitroGLYCERIN (NITROSTAT) 0.4 MG SL tablet PLACE 1 TABLET (0.4 MG TOTAL) UNDER THE TONGUE EVERY 5 (FIVE) MINUTES AS NEEDED FOR CHEST PAIN. 25 tablet 3  . trandolapril (MAVIK) 2 MG tablet Take 2 mg by mouth daily.     No current facility-administered medications for this visit.    Allergies: Allergies  Allergen Reactions  . Meperidine Hcl     REACTION: Hypotension  . Demerol Other (See Comments)    hypotension  . Heparin Other (See Comments)    hypotension  . Metformin And Related Diarrhea    Social History: The patient  reports that he quit smoking about 59 years ago. He has never used smokeless tobacco. He reports that he does not drink alcohol or use illicit drugs.   Family History: The patient's family history includes Coronary artery disease in his father; Diabetes in his mother.   Review of Systems: Please see the history of present illness.   Otherwise, the review of systems is positive for chest pain.   All other systems are reviewed and negative.   Physical Exam: VS:  BP 130/50 mmHg  Pulse 64  Ht 5\' 7"  (1.702 m)  Wt 70.217 kg (154 lb 12.8 oz)  BMI 24.24 kg/m2  SpO2 97% .  BMI Body mass index is 24.24 kg/(m^2).  Wt Readings from Last 3 Encounters:  06/13/15 70.217 kg (154 lb 12.8 oz)  04/05/15 70.489 kg (155 lb  6.4 oz)  01/28/15 70.943 kg (156 lb 6.4 oz)    General: Pleasant, elderly male. He is in no acute distress. His weight is up 9 pounds. HEENT: Normal but with missing teeth. Neck: Supple, no JVD, carotid bruits, or masses noted. Pacer under left clavicle  Cardiac: Regular rate and rhythm. Harsh  outflow murmur. Trace edema.  Respiratory:  Lungs are clear to auscultation bilaterally with normal work of breathing.  GI: Soft and nontender.  MS: No deformity or atrophy. Gait and ROM intact. Skin: Warm and dry. Color is normal.  Neuro:  Strength and sensation are intact and no gross focal deficits noted.  Psych: Alert, appropriate and with normal affect.   LABORATORY DATA:  EKG:  03/14/14  AV pacing rate 64    Lab Results  Component Value Date   WBC 4.9 01/30/2014   HGB 12.0* 01/30/2014   HCT 36.4* 01/30/2014   PLT 179.0 01/30/2014   GLUCOSE 249* 05/16/2014   ALT 19 01/16/2014   AST 20 01/16/2014   NA 138 05/16/2014   K 4.1 05/16/2014   CL 101 05/16/2014   CREATININE 1.11 05/16/2014   BUN 14 05/16/2014   CO2 30 05/16/2014   TSH 0.458 12/20/2014   INR 1.28 01/16/2014    BNP (last 3 results) No results for input(s): BNP in the last 8760 hours.  ProBNP (last 3 results) No results for input(s): PROBNP in the last 8760 hours.   Other Studies Reviewed Today:  Echo Study Conclusions from 12/2013  - Left ventricle: The cavity size was normal. Wall thickness was normal. Systolic function was mildly reduced. The estimated ejection fraction was in the range of 45% to 50%. Diffuse hypokinesis. Doppler parameters are consistent with abnormal left ventricular relaxation (grade 1 diastolic dysfunction). - Aortic valve: There was mild regurgitation. - Mitral valve: Calcified annulus. - Left atrium: The atrium was mildly dilated.   Angiographic Findings from 12/2013:  Left main: 30% distal stenosis.   Left Anterior Descending Artery: 100% proximal occlusion. Mid and  distal LAD fills from the patent vein graft. The diagonal fills from the patent vein graft.   Circumflex Artery: 100% proximal occlusion. The first and second OM branches fill from the patent vein graft.   Right Coronary Artery: Large dominant vessel with diffuse 99% stenosis in the proximal, mid and distal vessel. The PDA and PLA fill from the patent vein graft.   Graft Anatomy:  SVG to PDA is patent SVG to OM1/OM2 is patent with 95% stenosis mid body of SVG SVG to LAD is patent SVG to Diagonal is patent  Left Ventricular Angiogram: Deferred.   Impression: 1. Severe triple vessel disease s/p 5 vessel CABG with 5/5 patent bypass grafts. 2. Severe stenosis mid body of SVG to OM 3. Unstable angina 4. Successful PTCA/DES x 1 mid body of SVG to OM1/OM2  Recommendations: Continue ASA and Plavix for one year.        Assessment / Plan: 1. Chronic combined systolic and diastolic HF - stable  Continue current meds    2. 12/2013  PCI/DES to SVG to OM1/OM2 - continue DAPT.  And nitrates He will call if he gets any rest pain   3. Underlying PPM - followed by Dr. Rayann [name] with recent check -more reprogramming  with his rate responsiveness   Will need office f/u   4. Advanced age  DNR confirmed with patient and doumented in NH papers forms filled out   5. Insomnia:  Continue melatonin avoid day time naps   Current medicines are reviewed with the patient today.  The patient does not have concerns regarding medicines other than what has been noted above.  The following changes have been made:  See above.  Labs/ tests ordered today include:    No orders of the defined types were placed in  this encounter.     Disposition:   FU with me  in 6 months.   Patient is agreeable to this plan and will call if any problems develop in the interim.   Jenkins Rouge

## 2015-06-13 NOTE — Patient Instructions (Signed)

## 2015-06-26 ENCOUNTER — Other Ambulatory Visit: Payer: Self-pay | Admitting: *Deleted

## 2015-06-26 MED ORDER — CLOPIDOGREL BISULFATE 75 MG PO TABS: 75.0000 mg | ORAL_TABLET | Freq: Every day | ORAL | Status: DC

## 2015-07-08 ENCOUNTER — Telehealth: Payer: Self-pay | Admitting: Internal Medicine

## 2015-07-08 ENCOUNTER — Telehealth: Payer: Self-pay | Admitting: Cardiology

## 2015-07-08 ENCOUNTER — Encounter: Payer: Medicare Other | Admitting: *Deleted

## 2015-07-08 NOTE — Telephone Encounter (Signed)
New Message  Pt calling to speak w/ Device- stated can no longer do Remot transmissions. Please call back and discuss.

## 2015-07-08 NOTE — Telephone Encounter (Signed)
Spoke with pt and reminded pt of remote transmission that is due today. Pt verbalized understanding.   

## 2015-07-08 NOTE — Telephone Encounter (Signed)
LMOM to return call to device clinic to discuss remote monitor.

## 2015-07-12 ENCOUNTER — Encounter: Payer: Self-pay | Admitting: Cardiology

## 2015-11-14 ENCOUNTER — Encounter: Payer: Medicare Other | Admitting: *Deleted

## 2015-11-14 ENCOUNTER — Telehealth: Payer: Self-pay | Admitting: Cardiology

## 2015-11-14 NOTE — Telephone Encounter (Signed)
Spoke with pt and reminded pt of remote transmission that is due today. Pt verbalized understanding.   

## 2015-11-15 ENCOUNTER — Telehealth: Payer: Self-pay | Admitting: Internal Medicine

## 2015-11-15 ENCOUNTER — Encounter: Payer: Self-pay | Admitting: Cardiology

## 2015-11-15 ENCOUNTER — Telehealth: Payer: Self-pay | Admitting: Cardiovascular Disease

## 2015-11-15 NOTE — Telephone Encounter (Signed)
Spoke w/ pt and informed him that we did not received a remote transmission from Thursday 11-14-15. I am going to mail patient instructions on how to use his monitor and once the pt receives these he is going to try again to send a remote transmission.

## 2015-11-15 NOTE — Progress Notes (Signed)
Letter  

## 2015-11-15 NOTE — Telephone Encounter (Signed)
New message  Pt call requesting to speak with RN about his device. Pt wants to be sure transmission was received, please call back to discuss

## 2015-11-15 NOTE — Telephone Encounter (Signed)
New message  Pt is having issues w/device/pacer

## 2015-11-20 NOTE — Progress Notes (Signed)
Patient ID: Evan [name], male   DOB: 11-18-1923, 82 y.o.   MRN: [id number]     CARDIOLOGY OFFICE NOTE  Date:  11/21/2015    Evan [name] Date of Birth: May 30, 1923 Medical Record [id number]  PCP:  Evan Pel, MD  Cardiologist:  Evan [name]    Chief Complaint  Patient presents with  . CSHF  . CHB     History of Present Illness: Evan [name] is a 82 y.o. male who presents today for f/u SSS and CAD  Prior CABG back in 1996. Admitted in December of 2015 with syncope/bradycardia and chest pain - was cathed and had DES to the SVG to the OM - had 5/5 patent bypass grafts. On DAPT for one year with Plavix and aspirin. Cath was followed by PPM implant per Dr. Rayann [name] - dual chamber device. Has chronic weakness in the legs/atrophy and has had frequent falls. He is on chronic amiodarone for PAF. Other issues include aortic stenosis, DM, HLD, and HTN. Has some mild LV dysfunction per recent echo with grade 1 diastolic dysfunction.      He is a DNR. He was discharged back to nursing facility on O2 (O2 sats 87 with ambulation). ARB was stopped secondary to renal insufficiency. Arterial dopplers done showed distal Lt poplitial disease of 50-99%-no intervention planned.   Synthroid dose increased by PA Lab Results  Component Value Date   TSH 0.458 12/20/2014    Angina stable with exertion has not needed to take nitro Unable to due trans telephonic monitoring at [facility] on [address] able to do 2 cross word puzzles / day Some insomnia off melatonin now  Can get some angina if HR over 85 Has fit bit and just slows down Has not taken nitro  Needs pacer checked cannot use [facility]telephonic at [facility]   Past Medical History:  Diagnosis Date  . AC joint dislocation   . ALLERGIC RHINITIS   . Allergic rhinitis   . Anginal pain ([place])   . Arthritis   . Atrial tachycardia ([place])   . BPH (benign prostatic hyperplasia)   . CAD   . CHF (congestive heart  failure) ([place])   . Diarrhea   . DM    type 2  . GERD   . GERD (gastroesophageal reflux disease)   . H/O: GI bleed   . HYPERCHOLESTEROLEMIA   . HYPERTENSION   . Iron deficiency anemia   . Nephrolithiasis   . NEPHROLITHIASIS, HX OF   . Paroxysmal atrial fibrillation (HCC)   . Presence of permanent cardiac pacemaker 12/12/2013  . Rib fracture   . Stable angina (HCC)   . Syncope   . Thyroid disease   . TRANSIENT ISCHEMIC ATTACKS, HX OF   . Trifascicular block    a. s/p MDT Evan [name] pacemaker Dr Evan [name]  . Urinary frequency   . Vertigo   . Weakness of left leg     Past Surgical History:  Procedure Laterality Date  . AV FISTULA REPAIR    . BACK SURGERY  2000'S  . CARDIAC CATHETERIZATION  12/11/2013   Procedure: CORONARY STENT INTERVENTION;  Surgeon: Evan Blanks, MD;  Location: [facility] CATH LAB;  Service: Cardiovascular;;  DES 3.5 x 15 Resolute to seq SVG of OM1/OM2   . CORONARY ARTERY BYPASS GRAFT  1996  . CORONARY STENT PLACEMENT  12/11/13   SVG-OM DES  . FEMORAL ARTERY ANEURYSM REPAIR     feroral pseudo-aneurysm repair  . INSERT / REPLACE /  REMOVE PACEMAKER    . LEFT HEART CATHETERIZATION WITH CORONARY/GRAFT ANGIOGRAM N/A 12/11/2013   Procedure: LEFT HEART CATHETERIZATION WITH Beatrix Fetters;  Surgeon: Evan Blanks, MD;  Location: [facility] CATH LAB;  Service: Cardiovascular;  Laterality: N/A;  . PERMANENT PACEMAKER INSERTION N/A 12/12/2013   MDT Evan [name] pacemaker implnated by Dr Evan [name]  . PTCA  1980's     Medications: Current Outpatient Prescriptions  Medication Sig Dispense Refill  . amiodarone (PACERONE) 200 MG tablet Take 1 tablet (200 mg total) by mouth daily. 90 tablet 3  . amLODipine (NORVASC) 5 MG tablet Take 1 tablet (5 mg total) by mouth daily. 90 tablet 3  . aspirin 81 MG chewable tablet Chew 1 tablet (81 mg total) by mouth daily.    Marland Kitchen atorvastatin (LIPITOR) 10 MG tablet Take 1 tablet (10 mg total) by mouth daily. 30 tablet 3  . carvedilol  (COREG) 12.5 MG tablet Take 1 tablet (12.5 mg total) by mouth 2 (two) times daily with a meal. 180 tablet 3  . clopidogrel (PLAVIX) 75 MG tablet Take 1 tablet (75 mg total) by mouth daily with breakfast. 90 tablet 3  . Cyanocobalamin (VITAMIN B-12 PO) Take 1 tablet by mouth 3 (three) times a week.    . finasteride (PROSCAR) 5 MG tablet Take 5 mg by mouth daily.    . furosemide (LASIX) 40 MG tablet Take 80 mg by mouth daily.  2  . isosorbide mononitrate (IMDUR) 30 MG 24 hr tablet Take 1 tablet (30 mg total) by mouth daily. 90 tablet 3  . levothyroxine (SYNTHROID, LEVOTHROID) 112 MCG tablet Take 112 mcg by mouth daily before breakfast.    . nitroGLYCERIN (NITROSTAT) 0.4 MG SL tablet PLACE 1 TABLET (0.4 MG TOTAL) UNDER THE TONGUE EVERY 5 (FIVE) MINUTES AS NEEDED FOR CHEST PAIN. 25 tablet 3  . sitaGLIPtin (JANUVIA) 100 MG tablet Take 50 mg by mouth daily.     No current facility-administered medications for this visit.     Allergies: Allergies  Allergen Reactions  . Meperidine Hcl     REACTION: Hypotension  . Demerol Other (See Comments)    hypotension  . Heparin Other (See Comments)    hypotension  . Metformin And Related Diarrhea    Social History: The patient  reports that he quit smoking about 59 years ago. He has never used smokeless tobacco. He reports that he does not drink alcohol or use drugs.   Family History: The patient's family history includes Coronary artery disease in his father; Diabetes in his mother.   Review of Systems: Please see the history of present illness.   Otherwise, the review of systems is positive for chest pain.   All other systems are reviewed and negative.   Physical Exam: VS:  BP 120/60   Pulse 71   Ht 5\' 7"  (1.702 m)   Wt 74.8 kg (164 lb 12.8 oz)   SpO2 98%   BMI 25.81 kg/m  .  BMI Body mass index is 25.81 kg/m.  Wt Readings from Last 3 Encounters:  11/21/15 74.8 kg (164 lb 12.8 oz)  06/13/15 70.2 kg (154 lb 12.8 oz)  04/05/15 70.5 kg  (155 lb 6.4 oz)    General: Pleasant, elderly male. He is in no acute distress. His weight is up 9 pounds.  HEENT: Normal but with missing teeth.  Neck: Supple, no JVD, carotid bruits, or masses noted. Pacer under left clavicle  Cardiac: Regular rate and rhythm. Harsh outflow murmur. Trace edema.  Respiratory:  Lungs are clear to auscultation bilaterally with normal work of breathing.  GI: Soft and nontender.  MS: No deformity or atrophy. Gait and ROM intact.  Skin: Warm and dry. Color is normal.  Neuro:  Strength and sensation are intact and no gross focal deficits noted.  Psych: Alert, appropriate and with normal affect. Pacer under left clavicle Post resection skin cancer left temple   LABORATORY DATA:  EKG:  03/14/14  AV pacing rate 64    Lab Results  Component Value Date   WBC 4.9 01/30/2014   HGB 12.0 (L) 01/30/2014   HCT 36.4 (L) 01/30/2014   PLT 179.0 01/30/2014   GLUCOSE 249 (H) 05/16/2014   ALT 19 01/16/2014   AST 20 01/16/2014   NA 138 05/16/2014   K 4.1 05/16/2014   CL 101 05/16/2014   CREATININE 1.11 05/16/2014   BUN 14 05/16/2014   CO2 30 05/16/2014   TSH 0.458 12/20/2014   INR 1.28 01/16/2014    BNP (last 3 results) No results for input(s): BNP in the last 8760 hours.  ProBNP (last 3 results) No results for input(s): PROBNP in the last 8760 hours.   Other Studies Reviewed Today:  Echo Study Conclusions from 12/2013  - Left ventricle: The cavity size was normal. Wall thickness was normal. Systolic function was mildly reduced. The estimated ejection fraction was in the range of 45% to 50%. Diffuse hypokinesis. Doppler parameters are consistent with abnormal left ventricular relaxation (grade 1 diastolic dysfunction). - Aortic valve: There was mild regurgitation. - Mitral valve: Calcified annulus. - Left atrium: The atrium was mildly dilated.   Angiographic Findings from 12/2013:  Left main: 30% distal stenosis.   Left Anterior  Descending Artery: 100% proximal occlusion. Mid and distal LAD fills from the patent vein graft. The diagonal fills from the patent vein graft.   Circumflex Artery: 100% proximal occlusion. The first and second OM branches fill from the patent vein graft.   Right Coronary Artery: Large dominant vessel with diffuse 99% stenosis in the proximal, mid and distal vessel. The PDA and PLA fill from the patent vein graft.   Graft Anatomy:  SVG to PDA is patent SVG to OM1/OM2 is patent with 95% stenosis mid body of SVG SVG to LAD is patent SVG to Diagonal is patent  Left Ventricular Angiogram: Deferred.   Impression: 1. Severe triple vessel disease s/p 5 vessel CABG with 5/5 patent bypass grafts. 2. Severe stenosis mid body of SVG to OM 3. Unstable angina 4. Successful PTCA/DES x 1 mid body of SVG to OM1/OM2  Recommendations: Continue ASA and Plavix for one year.        Assessment / Plan: 1. Chronic combined systolic and diastolic HF - stable  Continue current meds    2. 12/2013  PCI/DES to SVG to OM1/OM2 - continue DAPT.  And nitrates He will call if he gets any rest pain   3. Underlying PPM - followed by Dr. Rayann [name] normal thressholds will try to get checked today Needs office appts if transtelephonic cannot be used at [facility]  4. Advanced age  DNR confirmed with patient and doumented in NH papers forms filled out   5. Insomnia:  Improved off melatonin  6. Skin cancer:  F/u Albertini left temple slow to heal   Current medicines are reviewed with the patient today.  The patient does not have concerns regarding medicines other than what has been noted above.  The following changes have been made:  See above.  Labs/ tests ordered today include:    No orders of the defined types were placed in this encounter.    Disposition:   FU with me  in 6 months.   Patient is agreeable to this plan and will call if any problems develop in the interim.   Jenkins Rouge

## 2015-11-21 ENCOUNTER — Encounter: Payer: Self-pay | Admitting: Cardiology

## 2015-11-21 ENCOUNTER — Ambulatory Visit (INDEPENDENT_AMBULATORY_CARE_PROVIDER_SITE_OTHER): Payer: Medicare Other | Admitting: *Deleted

## 2015-11-21 ENCOUNTER — Encounter: Payer: Self-pay | Admitting: Cardiovascular Disease

## 2015-11-21 ENCOUNTER — Encounter (INDEPENDENT_AMBULATORY_CARE_PROVIDER_SITE_OTHER): Payer: Self-pay

## 2015-11-21 ENCOUNTER — Ambulatory Visit (INDEPENDENT_AMBULATORY_CARE_PROVIDER_SITE_OTHER): Payer: Medicare Other | Admitting: Cardiovascular Disease

## 2015-11-21 VITALS — BP 120/60 | HR 71 | Ht 67.0 in | Wt 164.8 lb

## 2015-11-21 DIAGNOSIS — I5022 Chronic systolic (congestive) heart failure: Secondary | ICD-10-CM

## 2015-11-21 DIAGNOSIS — I495 Sick sinus syndrome: Secondary | ICD-10-CM | POA: Diagnosis not present

## 2015-11-21 DIAGNOSIS — I459 Conduction disorder, unspecified: Secondary | ICD-10-CM | POA: Diagnosis not present

## 2015-11-21 DIAGNOSIS — I442 Atrioventricular block, complete: Secondary | ICD-10-CM | POA: Diagnosis not present

## 2015-11-21 LAB — CUP PACEART INCLINIC DEVICE CHECK
Brady Statistic AP VP Percent: 100 %
Brady Statistic AP VS Percent: 0 %
Brady Statistic AS VP Percent: 0 %
Brady Statistic AS VS Percent: 0 %
Implantable Lead Implant Date: 20151208
Implantable Lead Model: 5076
Implantable Lead Model: 5092
Lead Channel Impedance Value: 460 Ohm
Lead Channel Impedance Value: 655 Ohm
Lead Channel Pacing Threshold Amplitude: 0.75 V
Lead Channel Pacing Threshold Amplitude: 0.75 V
Lead Channel Pacing Threshold Pulse Width: 0.4 ms
Lead Channel Setting Sensing Sensitivity: 4 mV
MDC IDC LEAD IMPLANT DT: 20151208
MDC IDC LEAD LOCATION: 753859
MDC IDC LEAD LOCATION: 753860
MDC IDC MSMT BATTERY IMPEDANCE: 136 Ohm
MDC IDC MSMT BATTERY REMAINING LONGEVITY: 120 mo
MDC IDC MSMT BATTERY VOLTAGE: 2.79 V
MDC IDC MSMT LEADCHNL RV PACING THRESHOLD PULSEWIDTH: 0.4 ms
MDC IDC PG IMPLANT DT: 20151208
MDC IDC SESS DTM: 20171116114709
MDC IDC SET LEADCHNL RA PACING AMPLITUDE: 2 V
MDC IDC SET LEADCHNL RV PACING AMPLITUDE: 2.5 V
MDC IDC SET LEADCHNL RV PACING PULSEWIDTH: 0.4 ms

## 2015-11-21 NOTE — Progress Notes (Signed)
Pacemaker check in clinic. Normal device function. Thresholds, sensing, impedances consistent with previous measurements. Device programmed to maximize longevity. No mode switch or high ventricular rates noted. Device programmed at appropriate safety margins. Histogram distribution appropriate for patient activity level. Device programmed to optimize intrinsic conduction. Estimated longevity 10 years. Patient enrolled in remote follow-up, Carelink monitor education provided today. Patient education completed. Carelink on 02/20/16 and ROV with JA on 04/06/16.

## 2015-11-21 NOTE — Patient Instructions (Addendum)
Medication Instructions:  Your physician recommends that you continue on your current medications as directed. Please refer to the Current Medication list given to you today.  Labwork: NONE  Testing/Procedures: NONE  Follow-Up: Your physician wants you to follow-up in: 6 months with [name]. You will receive a reminder letter in the mail two months in advance. If you don't receive a letter, please call our office to schedule the follow-up appointment.  Your physician recommends that you schedule a follow-up appointment with Dr. Rayann Heman in March or EP PA per recall.   If you need a refill on your cardiac medications before your next appointment, please call your pharmacy.

## 2016-02-05 ENCOUNTER — Encounter: Payer: Self-pay | Admitting: Internal Medicine

## 2016-02-20 ENCOUNTER — Encounter: Payer: Medicare Other | Admitting: *Deleted

## 2016-02-20 ENCOUNTER — Telehealth: Payer: Self-pay | Admitting: Cardiology

## 2016-02-20 NOTE — Telephone Encounter (Signed)
LMOVM reminding pt to send remote transmission.   

## 2016-02-25 ENCOUNTER — Encounter: Payer: Self-pay | Admitting: Cardiology

## 2016-04-06 ENCOUNTER — Encounter: Payer: Medicare Other | Admitting: Internal Medicine

## 2016-04-06 ENCOUNTER — Other Ambulatory Visit: Payer: Self-pay | Admitting: *Deleted

## 2016-04-06 DIAGNOSIS — I5043 Acute on chronic combined systolic (congestive) and diastolic (congestive) heart failure: Secondary | ICD-10-CM

## 2016-04-06 MED ORDER — AMLODIPINE BESYLATE 5 MG PO TABS: 5.0000 mg | ORAL_TABLET | Freq: Every day | ORAL | 1 refills | Status: DC

## 2016-04-06 MED ORDER — AMIODARONE HCL 200 MG PO TABS: 200.0000 mg | ORAL_TABLET | Freq: Every day | ORAL | 1 refills | Status: DC

## 2016-04-09 ENCOUNTER — Telehealth: Payer: Self-pay | Admitting: Cardiovascular Disease

## 2016-04-09 NOTE — Telephone Encounter (Signed)
Pharmacy called and stated that they did not receive a refill for amiodarone. Informed pharmacy that refill was sent in on 04/06/16. Pharmacy rechecked and refill had been sent. Pharmacy request for our office to reach out to patient to have them call them to confirm shipping. Pharmacy has tried calling patient themselves, but have not been successful. Left message for patient to call back.

## 2016-04-09 NOTE — Telephone Encounter (Signed)
New message    [facility] is calling to find out about refill request for amiodarone 200 mg tablet for pt.

## 2016-04-10 ENCOUNTER — Encounter: Payer: Self-pay | Admitting: Internal Medicine

## 2016-04-11 ENCOUNTER — Other Ambulatory Visit: Payer: Self-pay | Admitting: Cardiovascular Disease

## 2016-04-13 ENCOUNTER — Ambulatory Visit (INDEPENDENT_AMBULATORY_CARE_PROVIDER_SITE_OTHER): Payer: Medicare Other | Admitting: Internal Medicine

## 2016-04-13 ENCOUNTER — Encounter: Payer: Self-pay | Admitting: Internal Medicine

## 2016-04-13 VITALS — BP 132/54 | HR 74 | Ht 67.0 in | Wt 161.4 lb

## 2016-04-13 DIAGNOSIS — I480 Paroxysmal atrial fibrillation: Secondary | ICD-10-CM

## 2016-04-13 DIAGNOSIS — I10 Essential (primary) hypertension: Secondary | ICD-10-CM | POA: Diagnosis not present

## 2016-04-13 DIAGNOSIS — I442 Atrioventricular block, complete: Secondary | ICD-10-CM | POA: Diagnosis not present

## 2016-04-13 LAB — CUP PACEART INCLINIC DEVICE CHECK
Brady Statistic AP VP Percent: 100 %
Brady Statistic AS VP Percent: 0 %
Brady Statistic AS VS Percent: 0 %
Date Time Interrogation Session: 20180409151723
Implantable Lead Implant Date: 20151208
Implantable Lead Location: 753860
Implantable Lead Model: 5092
Lead Channel Impedance Value: 623 Ohm
Lead Channel Pacing Threshold Amplitude: 0.875 V
Lead Channel Pacing Threshold Pulse Width: 0.4 ms
Lead Channel Setting Pacing Amplitude: 2 V
Lead Channel Setting Pacing Pulse Width: 0.4 ms
Lead Channel Setting Sensing Sensitivity: 4 mV
MDC IDC LEAD IMPLANT DT: 20151208
MDC IDC LEAD LOCATION: 753859
MDC IDC MSMT BATTERY IMPEDANCE: 160 Ohm
MDC IDC MSMT BATTERY REMAINING LONGEVITY: 113 mo
MDC IDC MSMT BATTERY VOLTAGE: 2.79 V
MDC IDC MSMT LEADCHNL RA IMPEDANCE VALUE: 447 Ohm
MDC IDC MSMT LEADCHNL RA PACING THRESHOLD AMPLITUDE: 0.625 V
MDC IDC MSMT LEADCHNL RA PACING THRESHOLD AMPLITUDE: 0.75 V
MDC IDC MSMT LEADCHNL RA PACING THRESHOLD PULSEWIDTH: 0.4 ms
MDC IDC MSMT LEADCHNL RA PACING THRESHOLD PULSEWIDTH: 0.4 ms
MDC IDC MSMT LEADCHNL RV PACING THRESHOLD AMPLITUDE: 1 V
MDC IDC MSMT LEADCHNL RV PACING THRESHOLD PULSEWIDTH: 0.4 ms
MDC IDC PG IMPLANT DT: 20151208
MDC IDC SET LEADCHNL RV PACING AMPLITUDE: 2.5 V
MDC IDC STAT BRADY AP VS PERCENT: 0 %

## 2016-04-13 MED ORDER — AMIODARONE HCL 200 MG PO TABS: 100.0000 mg | ORAL_TABLET | Freq: Every day | ORAL | 1 refills | Status: DC

## 2016-04-13 NOTE — Progress Notes (Signed)
Electrophysiology Office Note   Date:  04/13/2016   ID:  Evan [name], DOB 1923/06/01, MRN [id number]  PCP:  Horatio Pel, MD  Cardiologist:  Dr Johnsie Cancel Primary Electrophysiologist: Thompson Grayer, MD    Chief Complaint  Patient presents with  . Follow-up    CHB     History of Present Illness: Evan [name] is a 83 y.o. male who presents today for electrophysiology evaluation.  He iss doing reasonably well.  He has exertional chest pain with moderate activity.  He thinks that this corresponds to heart rates over 85 bpm.  Of note, last visit to EP clinic, he noticed CP with heart rates over 90 bpm and his device was reprogrammed to a max tracking rate of 90 bpm.  He is otherwise doing well. Today, he denies symptoms of palpitations,  shortness of breath, orthopnea, PND, lower extremity edema, claudication, dizziness, presyncope, syncope, bleeding, or neurologic sequela. The patient is tolerating medications without difficulties and is otherwise without complaint today.    Past Medical History:  Diagnosis Date  . AC joint dislocation   . ALLERGIC RHINITIS   . Allergic rhinitis   . Anginal pain ([place])   . Arthritis   . Atrial tachycardia ([place])   . BPH (benign prostatic hyperplasia)   . CAD   . CHF (congestive heart failure) ([place])   . Diarrhea   . DM    type 2  . GERD   . GERD (gastroesophageal reflux disease)   . H/O: GI bleed   . HYPERCHOLESTEROLEMIA   . HYPERTENSION   . Iron deficiency anemia   . Nephrolithiasis   . NEPHROLITHIASIS, HX OF   . Paroxysmal atrial fibrillation (HCC)   . Presence of permanent cardiac pacemaker 12/12/2013  . Rib fracture   . Stable angina (HCC)   . Syncope   . Thyroid disease   . TRANSIENT ISCHEMIC ATTACKS, HX OF   . 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 Urinary frequency   . Vertigo   . Weakness of left leg    Past Surgical History:  Procedure Laterality Date  . AV FISTULA REPAIR    . BACK  SURGERY  2000'S  . CARDIAC CATHETERIZATION  12/11/2013   Procedure: CORONARY STENT INTERVENTION;  Surgeon: Burnell Blanks, MD;  Location: [facility] CATH LAB;  Service: Cardiovascular;;  DES 3.5 x 15 Resolute to seq SVG of OM1/OM2   . CORONARY ARTERY BYPASS GRAFT  1996  . CORONARY STENT PLACEMENT  12/11/13   SVG-OM DES  . FEMORAL ARTERY ANEURYSM REPAIR     feroral pseudo-aneurysm repair  . INSERT / REPLACE / REMOVE PACEMAKER    . LEFT HEART CATHETERIZATION WITH CORONARY/GRAFT ANGIOGRAM N/A 12/11/2013   Procedure: LEFT HEART CATHETERIZATION WITH Beatrix Fetters;  Surgeon: Burnell Blanks, MD;  Location[facility] LAB;  Service: Cardiovascular;  Laterality: N/A;  . PERMANENT PACEMAKER INSERTION N/A 12/12/2013   MDT ADDRL1 pacemaker implnated by Dr Rayann Heman  . PTCA  1980's     Current Outpatient Prescriptions  Medication Sig Dispense Refill  . acetaminophen (TYLENOL) 325 MG tablet Take 650 mg by mouth every 6 (six) hours as needed (pain).    Marland Kitchen albuterol (PROVENTIL HFA;VENTOLIN HFA) 108 (90 Base) MCG/ACT inhaler Inhale 1-2 puffs into the lungs as directed. Use every 4-6 hours as needed for shortness of breath or wheezing    . amiodarone (PACERONE) 200 MG tablet Take 1 tablet (200 mg total) by mouth  daily. 90 tablet 1  . amLODipine (NORVASC) 5 MG tablet Take 1 tablet (5 mg total) by mouth daily. 90 tablet 1  . aspirin 81 MG chewable tablet Chew 1 tablet (81 mg total) by mouth daily.    Marland Kitchen atorvastatin (LIPITOR) 10 MG tablet Take 1 tablet (10 mg total) by mouth daily. 30 tablet 3  . benzonatate (TESSALON) 200 MG capsule Take 200 mg by mouth 3 (three) times daily as needed for cough.    . carvedilol (COREG) 12.5 MG tablet TAKE 1 TABLET BY MOUTH TWICE DAILY WITH A MEAL 180 tablet 0  . clopidogrel (PLAVIX) 75 MG tablet Take 1 tablet (75 mg total) by mouth daily with breakfast. 90 tablet 3  . Cyanocobalamin (VITAMIN B-12 PO) Take 1 tablet by mouth 3 (three) times a week.    . finasteride  (PROSCAR) 5 MG tablet Take 5 mg by mouth daily.    . furosemide (LASIX) 80 MG tablet Take 80 mg by mouth daily.    Marland Kitchen ipratropium (ATROVENT) 0.06 % nasal spray Place 2 sprays into both nostrils 3 (three) times daily as needed for rhinitis.    Marland Kitchen isosorbide mononitrate (IMDUR) 30 MG 24 hr tablet Take 1 tablet (30 mg total) by mouth daily. 90 tablet 3  . JANUVIA 50 MG tablet Take 50 mg by mouth daily.  2  . levothyroxine (SYNTHROID, LEVOTHROID) 112 MCG tablet Take 112 mcg by mouth daily before breakfast.    . nitroGLYCERIN (NITROSTAT) 0.4 MG SL tablet PLACE 1 TABLET (0.4 MG TOTAL) UNDER THE TONGUE EVERY 5 (FIVE) MINUTES AS NEEDED FOR CHEST PAIN. 25 tablet 3   No current facility-administered medications for this visit.     Allergies:   Clonidine derivatives; Codeine; Meperidine hcl; Motrin [ibuprofen]; Other; Penicillins; Demerol; Heparin; and Metformin and related   Social History:  The patient  reports that he quit smoking about 60 years ago. He has never used smokeless tobacco. He reports that he does not drink alcohol or use drugs.   Family History:  The patient's family history includes Coronary artery disease in his father; Diabetes in his mother.    ROS:  Please see the history of present illness.   All other systems are reviewed and negative.    PHYSICAL EXAM: VS:  BP (!) 132/54   Pulse 74   Ht 5\' 7"  (1.702 m)   Wt 161 lb 6.4 oz (73.2 kg)   SpO2 98%   BMI 25.28 kg/m  , BMI Body mass index is 25.28 kg/m. GEN: eldely, in no acute distress  HEENT: OP clear Neck: no JVD, carotid bruits, or masses Cardiac: RRR  Respiratory:  clear to auscultation bilaterally, normal work of breathing GI: soft, nontender, nondistended, + BS MS: no deformity or atrophy  Skin: warm and dry, device pocket is well healed Neuro:  Strength and sensation are intact Psych: euthymic mood, full affect  EKG:  EKG is ordered today. The ekg ordered today shows AV pacing  Device interrogation is  personally reviewed today in detail.  See PaceArt for details.     Wt Readings from Last 3 Encounters:  04/13/16 161 lb 6.4 oz (73.2 kg)  11/21/15 164 lb 12.8 oz (74.8 kg)  06/13/15 154 lb 12.8 oz (70.2 kg)     Other studies Reviewed: Additional studies/ records that were reviewed today include: EP clinic notes, Dr Mariana Arn notes, 2015 cath   ASSESSMENT AND PLAN:  1.  Complete heart block Normal pacemaker function GIven exertional chest pain  with rate response,  I have made rate response less aggressive again today.  Max tracking rate is now 80 bpm.  I do not think that additional device programming would be beneficial.  No other changes are made.  carelink  2. Chest pain/ CAD As above Given advanced age, a conservative management strategy is planned. May benefit from medicine titration if symptoms continue.  Cath 2015 revealed patent grafts.  Doubt further cardiology procedures would be of benfit.  3. HTN Stable No change required today  4. atach On amiodarone No arrhythmias in the past year per device interrogation Reduce amiodarone to 100mg  daily Will need LFTs/ TFTs upon follow-up with Dr Johnsie Cancel next month   Current medicines are reviewed at length with the patient today.   The patient does not have concerns regarding his medicines.  The following changes were made today:  none   Follow-up: with Dr Johnsie Cancel as scheduled, Carelink, return to see EP NP in 1 year  Signed, Thompson Grayer, MD  04/13/2016 2:49 PM     [facility] [address] [place] [address] [phone] (office) [phone] (fax)

## 2016-04-13 NOTE — Patient Instructions (Signed)
Medication Instructions:  Your physician has recommended you make the following change in your medication:  1) Decrease Amiodarone to 100 mg daily   Labwork: None ordered  Testing/Procedures: None ordered   Follow-Up: Remote monitoring is used to monitor your Pacemaker  from home. This monitoring reduces the number of office visits required to check your device to one time per year. It allows [place] to keep an eye on the functioning of your device to ensure it is working properly. You are scheduled for a device check from home on 07/13/16. You may send your transmission at any time that day. If you have a wireless device, the transmission will be sent automatically. After your physician reviews your transmission, you will receive a postcard with your next transmission date.  Your physician wants you to follow-up in: 12 months with Chanetta Marshall, NP You will receive a reminder letter in the mail two months in advance. If you don't receive a letter, please call our office to schedule the follow-up appointment.     Any Other Special Instructions Will Be Listed Below (If Applicable).     If you need a refill on your cardiac medications before your next appointment, please call your pharmacy.

## 2016-04-30 NOTE — Progress Notes (Signed)
Patient ID: [name], male   DOB: 09-02-1923, 83 y.o.   MRN: [id number]     CARDIOLOGY OFFICE NOTE  Date:  05/06/2016    Evan [name] Date of Birth: 10-14-1923 Medical Record #[id number]  PCP:  Horatio Pel, MD  Cardiologist:  Johnsie Cancel    Chief Complaint  Patient presents with  . CHB     History of Present Illness: Evan [name] is a 83 y.o. male who presents today for f/u SSS and CAD  Prior CABG back in 1996. Admitted in December of 2015 with syncope/bradycardia and chest pain - was cathed and had DES to the SVG to the OM - had 5/5 patent bypass grafts. On DAPT for one year with Plavix and aspirin. Cath was followed by PPM implant per Dr. Rayann Heman - dual chamber device. Has chronic weakness in the legs/atrophy and has had frequent falls. He is on chronic amiodarone for PAF. Other issues include aortic stenosis, DM, HLD, and HTN. Has some mild LV dysfunction per recent echo with grade 1 diastolic dysfunction.      He is a DNR. He was discharged back to nursing facility on O2 (O2 sats 87 with ambulation). ARB was stopped secondary to renal insufficiency. Arterial dopplers done showed distal Lt poplitial disease of 50-99%-no intervention planned.   Synthroid dose increased by PA Lab Results  Component Value Date   TSH 0.458 12/20/2014    Angina stable with exertion has not needed to take nitro Unable to due trans telephonic monitoring at [facility] on [address] able to do 2 cross word puzzles / day Some insomnia off melatonin now  Can get some angina if HR over 85 Has fit bit and just slows down Has not taken nitro  Needs pacer checked cannot use trans-telephonic at NH  Moving from assisted living at [facility] to unassisted at [facility] His long term care Insurance has been good for him    Past Medical History:  Diagnosis Date  . AC joint dislocation   . ALLERGIC RHINITIS   . Allergic rhinitis   . Anginal pain ([place])     . Arthritis   . Atrial tachycardia ([place])   . BPH (benign prostatic hyperplasia)   . CAD   . CHF (congestive heart failure) ([place])   . Diarrhea   . DM    type 2  . GERD   . GERD (gastroesophageal reflux disease)   . H/O: GI bleed   . HYPERCHOLESTEROLEMIA   . HYPERTENSION   . Iron deficiency anemia   . Nephrolithiasis   . NEPHROLITHIASIS, HX OF   . Paroxysmal atrial fibrillation (HCC)   . Presence of permanent cardiac pacemaker 12/12/2013  . Rib fracture   . Stable angina (HCC)   . Syncope   . Thyroid disease   . TRANSIENT ISCHEMIC ATTACKS, HX OF   . 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 Urinary frequency   . Vertigo   . Weakness of left leg     Past Surgical History:  Procedure Laterality Date  . AV FISTULA REPAIR    . BACK SURGERY  2000'S  . CARDIAC CATHETERIZATION  12/11/2013   Procedure: CORONARY STENT INTERVENTION;  Surgeon: Burnell Blanks, MD;  Location: [facility] CATH LAB;  Service: Cardiovascular;;  DES 3.5 x 15 Resolute to seq SVG of OM1/OM2   . CORONARY ARTERY BYPASS GRAFT  1996  . CORONARY STENT PLACEMENT  12/11/13  SVG-OM DES  . FEMORAL ARTERY ANEURYSM REPAIR     feroral pseudo-aneurysm repair  . INSERT / REPLACE / REMOVE PACEMAKER    . LEFT HEART CATHETERIZATION WITH CORONARY/GRAFT ANGIOGRAM N/A 12/11/2013   Procedure: LEFT HEART CATHETERIZATION WITH Beatrix Fetters;  Surgeon: Burnell Blanks, MD;  Location: [facility] CATH LAB;  Service: Cardiovascular;  Laterality: N/A;  . PERMANENT PACEMAKER INSERTION N/A 12/12/2013   MDT ADDRL1 pacemaker implnated by Dr Rayann Heman  . PTCA  1980's     Medications: Current Outpatient Prescriptions  Medication Sig Dispense Refill  . acetaminophen (TYLENOL) 325 MG tablet Take 650 mg by mouth every 6 (six) hours as needed (pain).    Marland Kitchen albuterol (PROVENTIL HFA;VENTOLIN HFA) 108 (90 Base) MCG/ACT inhaler Inhale 1-2 puffs into the lungs as directed. Use every 4-6 hours as needed for shortness  of breath or wheezing    . amiodarone (PACERONE) 200 MG tablet Take 0.5 tablets (100 mg total) by mouth daily. 90 tablet 1  . amLODipine (NORVASC) 5 MG tablet Take 1 tablet (5 mg total) by mouth daily. 90 tablet 1  . aspirin 81 MG chewable tablet Chew 1 tablet (81 mg total) by mouth daily.    Marland Kitchen atorvastatin (LIPITOR) 10 MG tablet Take 1 tablet (10 mg total) by mouth daily. 30 tablet 3  . benzonatate (TESSALON) 200 MG capsule Take 200 mg by mouth 3 (three) times daily as needed for cough.    . carvedilol (COREG) 12.5 MG tablet TAKE 1 TABLET BY MOUTH TWICE DAILY WITH A MEAL 180 tablet 0  . clopidogrel (PLAVIX) 75 MG tablet Take 1 tablet (75 mg total) by mouth daily with breakfast. 90 tablet 3  . Cyanocobalamin (VITAMIN B-12 PO) Take 1 tablet by mouth 3 (three) times a week.    . finasteride (PROSCAR) 5 MG tablet Take 5 mg by mouth daily.    . furosemide (LASIX) 80 MG tablet Take 80 mg by mouth daily.    Marland Kitchen ipratropium (ATROVENT) 0.06 % nasal spray Place 2 sprays into both nostrils 3 (three) times daily as needed for rhinitis.    Marland Kitchen isosorbide mononitrate (IMDUR) 30 MG 24 hr tablet Take 1 tablet (30 mg total) by mouth daily. 90 tablet 3  . JANUVIA 50 MG tablet Take 50 mg by mouth daily.  2  . levothyroxine (SYNTHROID, LEVOTHROID) 112 MCG tablet Take 112 mcg by mouth daily before breakfast.    . nitroGLYCERIN (NITROSTAT) 0.4 MG SL tablet PLACE 1 TABLET (0.4 MG TOTAL) UNDER THE TONGUE EVERY 5 (FIVE) MINUTES AS NEEDED FOR CHEST PAIN. 25 tablet 3   No current facility-administered medications for this visit.     Allergies: Allergies  Allergen Reactions  . Clonidine Derivatives     unknown reaction   . Codeine     unknown reaction   . Meperidine Hcl     REACTION: Hypotension  . Motrin [Ibuprofen]     unknown reaction   . Other     Celery - unknown reaction  . Penicillins     unknown reaction   . Demerol Other (See Comments)    hypotension  . Heparin Other (See Comments)    hypotension   . Metformin And Related Diarrhea    Social History: The patient  reports that he quit smoking about 60 years ago. He has never used smokeless tobacco. He reports that he does not drink alcohol or use drugs.   Family History: The patient's family history includes Coronary artery disease in his father;  Diabetes in his mother.   Review of Systems: Please see the history of present illness.   Otherwise, the review of systems is positive for chest pain.   All other systems are reviewed and negative.   Physical Exam: VS:  BP 140/60   Pulse 68   Ht 5\' 7"  (1.702 m)   Wt 159 lb 1.9 oz (72.2 kg)   SpO2 98%   BMI 24.92 kg/m  .  BMI Body mass index is 24.92 kg/m.  Wt Readings from Last 3 Encounters:  05/06/16 159 lb 1.9 oz (72.2 kg)  04/13/16 161 lb 6.4 oz (73.2 kg)  11/21/15 164 lb 12.8 oz (74.8 kg)    General: Pleasant, elderly male. He is in no acute distress. His weight is up 9 pounds.  HEENT: Normal but with missing teeth.  Neck: Supple, no JVD, left bruits, or masses noted. Pacer under left clavicle  Cardiac: Regular rate and rhythm. Harsh outflow murmur. Trace edema.  Respiratory:  Lungs are clear to auscultation bilaterally with normal work of breathing.  GI: Soft and nontender.  MS: No deformity or atrophy. Gait and ROM intact.  Skin: Warm and dry. Color is normal.  Neuro:  Strength and sensation are intact and no gross focal deficits noted.  Psych: Alert, appropriate and with normal affect. Pacer under left clavicle Post resection skin cancer left temple   LABORATORY DATA:  EKG:  03/14/14  AV pacing rate 64    Lab Results  Component Value Date   WBC 4.9 01/30/2014   HGB 12.0 (L) 01/30/2014   HCT 36.4 (L) 01/30/2014   PLT 179.0 01/30/2014   GLUCOSE 249 (H) 05/16/2014   ALT 19 01/16/2014   AST 20 01/16/2014   NA 138 05/16/2014   K 4.1 05/16/2014   CL 101 05/16/2014   CREATININE 1.11 05/16/2014   BUN 14 05/16/2014   CO2 30 05/16/2014   TSH 0.458 12/20/2014    INR 1.28 01/16/2014    BNP (last 3 results) No results for input(s): BNP in the last 8760 hours.  ProBNP (last 3 results) No results for input(s): PROBNP in the last 8760 hours.   Other Studies Reviewed Today:  Echo Study Conclusions from 12/2013  - Left ventricle: The cavity size was normal. Wall thickness was normal. Systolic function was mildly reduced. The estimated ejection fraction was in the range of 45% to 50%. Diffuse hypokinesis. Doppler parameters are consistent with abnormal left ventricular relaxation (grade 1 diastolic dysfunction). - Aortic valve: There was mild regurgitation. - Mitral valve: Calcified annulus. - Left atrium: The atrium was mildly dilated.   Angiographic Findings from 12/2013:  Left main: 30% distal stenosis.   Left Anterior Descending Artery: 100% proximal occlusion. Mid and distal LAD fills from the patent vein graft. The diagonal fills from the patent vein graft.   Circumflex Artery: 100% proximal occlusion. The first and second OM branches fill from the patent vein graft.   Right Coronary Artery: Large dominant vessel with diffuse 99% stenosis in the proximal, mid and distal vessel. The PDA and PLA fill from the patent vein graft.   Graft Anatomy:  SVG to PDA is patent SVG to OM1/OM2 is patent with 95% stenosis mid body of SVG SVG to LAD is patent SVG to Diagonal is patent  Left Ventricular Angiogram: Deferred.   Impression: 1. Severe triple vessel disease s/p 5 vessel CABG with 5/5 patent bypass grafts. 2. Severe stenosis mid body of SVG to OM 3. Unstable angina 4. Successful  PTCA/DES x 1 mid body of SVG to OM1/OM2  Recommendations: Continue ASA and Plavix for one year.        Assessment / Plan: 1. Chronic combined systolic and diastolic HF - stable  Continue current meds    2. 12/2013  PCI/DES to SVG to OM1/OM2 - continue DAPT.  And nitrates He will call if he gets any rest pain   3. Underlying PPM  - followed by Dr. Rayann Heman tracking rate adjusted last visit   4. Advanced age  DNR confirmed with patient and doumented in NH papers forms filled out   5. Insomnia:  Improved off melatonin  6. Skin cancer:  F/u Albertini left temple slow to heal   Current medicines are reviewed with the patient today.  The patient does not have concerns regarding medicines other than what has been noted above.  The following changes have been made:  See above.  Labs/ tests ordered today include:    No orders of the defined types were placed in this encounter.    Disposition:   FU with me  in 6 months.   Patient is agreeable to this plan and will call if any problems develop in the interim.   Jenkins Rouge

## 2016-05-06 ENCOUNTER — Encounter: Payer: Self-pay | Admitting: Cardiovascular Disease

## 2016-05-06 ENCOUNTER — Ambulatory Visit (INDEPENDENT_AMBULATORY_CARE_PROVIDER_SITE_OTHER): Payer: Medicare Other | Admitting: Cardiovascular Disease

## 2016-05-06 VITALS — BP 140/60 | HR 68 | Ht 67.0 in | Wt 159.1 lb

## 2016-05-06 DIAGNOSIS — I442 Atrioventricular block, complete: Secondary | ICD-10-CM | POA: Diagnosis not present

## 2016-05-06 NOTE — Patient Instructions (Signed)

## 2016-06-23 ENCOUNTER — Encounter (HOSPITAL_COMMUNITY): Payer: Self-pay

## 2016-06-23 ENCOUNTER — Inpatient Hospital Stay (HOSPITAL_COMMUNITY)
Admission: EM | Admit: 2016-06-23 | Discharge: 2016-06-26 | DRG: 377 | Disposition: A | Discharge status: Home Health | Payer: Medicare Other | Attending: Internal Medicine | Admitting: Internal Medicine

## 2016-06-23 ENCOUNTER — Inpatient Hospital Stay (HOSPITAL_COMMUNITY): Payer: Medicare Other

## 2016-06-23 DIAGNOSIS — E7800 Pure hypercholesterolemia, unspecified: Secondary | ICD-10-CM | POA: Diagnosis present

## 2016-06-23 DIAGNOSIS — I5032 Chronic diastolic (congestive) heart failure: Secondary | ICD-10-CM | POA: Diagnosis present

## 2016-06-23 DIAGNOSIS — I351 Nonrheumatic aortic (valve) insufficiency: Secondary | ICD-10-CM | POA: Diagnosis present

## 2016-06-23 DIAGNOSIS — Z87891 Personal history of nicotine dependence: Secondary | ICD-10-CM

## 2016-06-23 DIAGNOSIS — Z950 Presence of cardiac pacemaker: Secondary | ICD-10-CM | POA: Diagnosis not present

## 2016-06-23 DIAGNOSIS — E1140 Type 2 diabetes mellitus with diabetic neuropathy, unspecified: Secondary | ICD-10-CM | POA: Diagnosis present

## 2016-06-23 DIAGNOSIS — Z955 Presence of coronary angioplasty implant and graft: Secondary | ICD-10-CM

## 2016-06-23 DIAGNOSIS — Z9861 Coronary angioplasty status: Secondary | ICD-10-CM | POA: Diagnosis not present

## 2016-06-23 DIAGNOSIS — E1122 Type 2 diabetes mellitus with diabetic chronic kidney disease: Secondary | ICD-10-CM | POA: Diagnosis present

## 2016-06-23 DIAGNOSIS — Z8249 Family history of ischemic heart disease and other diseases of the circulatory system: Secondary | ICD-10-CM

## 2016-06-23 DIAGNOSIS — I471 Supraventricular tachycardia: Secondary | ICD-10-CM | POA: Diagnosis present

## 2016-06-23 DIAGNOSIS — E785 Hyperlipidemia, unspecified: Secondary | ICD-10-CM | POA: Diagnosis present

## 2016-06-23 DIAGNOSIS — Z880 Allergy status to penicillin: Secondary | ICD-10-CM

## 2016-06-23 DIAGNOSIS — Z888 Allergy status to other drugs, medicaments and biological substances status: Secondary | ICD-10-CM

## 2016-06-23 DIAGNOSIS — R011 Cardiac murmur, unspecified: Secondary | ICD-10-CM | POA: Diagnosis present

## 2016-06-23 DIAGNOSIS — I10 Essential (primary) hypertension: Secondary | ICD-10-CM | POA: Diagnosis not present

## 2016-06-23 DIAGNOSIS — K219 Gastro-esophageal reflux disease without esophagitis: Secondary | ICD-10-CM | POA: Diagnosis present

## 2016-06-23 DIAGNOSIS — E1121 Type 2 diabetes mellitus with diabetic nephropathy: Secondary | ICD-10-CM | POA: Diagnosis not present

## 2016-06-23 DIAGNOSIS — Z833 Family history of diabetes mellitus: Secondary | ICD-10-CM

## 2016-06-23 DIAGNOSIS — I21A1 Myocardial infarction type 2: Secondary | ICD-10-CM | POA: Diagnosis present

## 2016-06-23 DIAGNOSIS — N183 Chronic kidney disease, stage 3 (moderate): Secondary | ICD-10-CM | POA: Diagnosis present

## 2016-06-23 DIAGNOSIS — I480 Paroxysmal atrial fibrillation: Secondary | ICD-10-CM | POA: Diagnosis present

## 2016-06-23 DIAGNOSIS — K625 Hemorrhage of anus and rectum: Secondary | ICD-10-CM | POA: Diagnosis present

## 2016-06-23 DIAGNOSIS — Z7982 Long term (current) use of aspirin: Secondary | ICD-10-CM

## 2016-06-23 DIAGNOSIS — N400 Enlarged prostate without lower urinary tract symptoms: Secondary | ICD-10-CM | POA: Diagnosis present

## 2016-06-23 DIAGNOSIS — H9190 Unspecified hearing loss, unspecified ear: Secondary | ICD-10-CM | POA: Diagnosis present

## 2016-06-23 DIAGNOSIS — I25810 Atherosclerosis of coronary artery bypass graft(s) without angina pectoris: Secondary | ICD-10-CM | POA: Diagnosis present

## 2016-06-23 DIAGNOSIS — Z8673 Personal history of transient ischemic attack (TIA), and cerebral infarction without residual deficits: Secondary | ICD-10-CM | POA: Diagnosis not present

## 2016-06-23 DIAGNOSIS — D62 Acute posthemorrhagic anemia: Secondary | ICD-10-CM | POA: Diagnosis present

## 2016-06-23 DIAGNOSIS — I110 Hypertensive heart disease with heart failure: Secondary | ICD-10-CM | POA: Diagnosis present

## 2016-06-23 DIAGNOSIS — R079 Chest pain, unspecified: Secondary | ICD-10-CM

## 2016-06-23 DIAGNOSIS — Z66 Do not resuscitate: Secondary | ICD-10-CM | POA: Diagnosis present

## 2016-06-23 DIAGNOSIS — I214 Non-ST elevation (NSTEMI) myocardial infarction: Secondary | ICD-10-CM | POA: Diagnosis not present

## 2016-06-23 DIAGNOSIS — Z885 Allergy status to narcotic agent status: Secondary | ICD-10-CM

## 2016-06-23 DIAGNOSIS — Z7902 Long term (current) use of antithrombotics/antiplatelets: Secondary | ICD-10-CM

## 2016-06-23 DIAGNOSIS — K5791 Diverticulosis of intestine, part unspecified, without perforation or abscess with bleeding: Principal | ICD-10-CM | POA: Diagnosis present

## 2016-06-23 DIAGNOSIS — I2510 Atherosclerotic heart disease of native coronary artery without angina pectoris: Secondary | ICD-10-CM | POA: Diagnosis present

## 2016-06-23 DIAGNOSIS — Z886 Allergy status to analgesic agent status: Secondary | ICD-10-CM

## 2016-06-23 DIAGNOSIS — E039 Hypothyroidism, unspecified: Secondary | ICD-10-CM | POA: Diagnosis present

## 2016-06-23 DIAGNOSIS — Z79899 Other long term (current) drug therapy: Secondary | ICD-10-CM

## 2016-06-23 DIAGNOSIS — Z7984 Long term (current) use of oral hypoglycemic drugs: Secondary | ICD-10-CM

## 2016-06-23 LAB — CBC WITH DIFFERENTIAL/PLATELET
BASOS ABS: 0 10*3/uL (ref 0.0–0.1)
BASOS PCT: 0 %
EOS PCT: 1 %
Eosinophils Absolute: 0.1 10*3/uL (ref 0.0–0.7)
HCT: 24.4 % — ABNORMAL LOW (ref 39.0–52.0)
Hemoglobin: 8 g/dL — ABNORMAL LOW (ref 13.0–17.0)
LYMPHS PCT: 17 %
Lymphs Abs: 1.2 10*3/uL (ref 0.7–4.0)
MCH: 26.6 pg (ref 26.0–34.0)
MCHC: 32.8 g/dL (ref 30.0–36.0)
MCV: 81.1 fL (ref 78.0–100.0)
Monocytes Absolute: 0.4 10*3/uL (ref 0.1–1.0)
Monocytes Relative: 5 %
Neutro Abs: 5.5 10*3/uL (ref 1.7–7.7)
Neutrophils Relative %: 77 %
PLATELETS: 188 10*3/uL (ref 150–400)
RBC: 3.01 MIL/uL — AB (ref 4.22–5.81)
RDW: 15.9 % — ABNORMAL HIGH (ref 11.5–15.5)
WBC: 7.1 10*3/uL (ref 4.0–10.5)

## 2016-06-23 LAB — COMPREHENSIVE METABOLIC PANEL
ALBUMIN: 4.2 g/dL (ref 3.5–5.0)
ALT: 16 U/L — AB (ref 17–63)
AST: 21 U/L (ref 15–41)
Alkaline Phosphatase: 59 U/L (ref 38–126)
Anion gap: 10 (ref 5–15)
BUN: 27 mg/dL — AB (ref 6–20)
CHLORIDE: 109 mmol/L (ref 101–111)
CO2: 22 mmol/L (ref 22–32)
CREATININE: 1.5 mg/dL — AB (ref 0.61–1.24)
Calcium: 8.9 mg/dL (ref 8.9–10.3)
GFR calc Af Amer: 45 mL/min — ABNORMAL LOW (ref >60)
GFR, EST NON AFRICAN AMERICAN: 39 mL/min — AB (ref >60)
Glucose, Bld: 194 mg/dL — ABNORMAL HIGH (ref 65–99)
POTASSIUM: 4.5 mmol/L (ref 3.5–5.1)
SODIUM: 141 mmol/L (ref 135–145)
Total Bilirubin: 0.8 mg/dL (ref 0.3–1.2)
Total Protein: 6.5 g/dL (ref 6.5–8.1)

## 2016-06-23 LAB — PREPARE RBC (CROSSMATCH)

## 2016-06-23 LAB — TROPONIN I
TROPONIN I: 0.24 ng/mL — AB (ref <0.03)
Troponin I: 1.08 ng/mL (ref <0.03)

## 2016-06-23 LAB — ABO/RH: ABO/RH(D): O POS

## 2016-06-23 LAB — PROTIME-INR
INR: 1.11
Prothrombin Time: 14.3 seconds (ref 11.4–15.2)

## 2016-06-23 LAB — APTT: aPTT: 25 seconds (ref 24–36)

## 2016-06-23 LAB — GLUCOSE, CAPILLARY
GLUCOSE-CAPILLARY: 235 mg/dL — AB (ref 65–99)
GLUCOSE-CAPILLARY: 296 mg/dL — AB (ref 65–99)

## 2016-06-23 MED ORDER — ONDANSETRON HCL 4 MG PO TABS: 4.0000 mg | ORAL_TABLET | Freq: Four times a day (QID) | ORAL | Status: DC | PRN

## 2016-06-23 MED ORDER — FUROSEMIDE 40 MG PO TABS
80.0000 mg | ORAL_TABLET | Freq: Every day | ORAL | Status: DC
  Administered 2016-06-24 – 2016-06-26 (×3): 80 mg via ORAL
  Filled 2016-06-23 (×3): qty 2

## 2016-06-23 MED ORDER — ACETAMINOPHEN 650 MG RE SUPP: 650.0000 mg | Freq: Four times a day (QID) | RECTAL | Status: DC | PRN

## 2016-06-23 MED ORDER — FUROSEMIDE 10 MG/ML IJ SOLN
20.0000 mg | Freq: Once | INTRAMUSCULAR | Status: AC
  Administered 2016-06-23: 20 mg via INTRAVENOUS

## 2016-06-23 MED ORDER — ACETAMINOPHEN 325 MG PO TABS: 650.0000 mg | ORAL_TABLET | Freq: Four times a day (QID) | ORAL | Status: DC | PRN

## 2016-06-23 MED ORDER — SODIUM CHLORIDE 0.9 % IV SOLN
INTRAVENOUS | Status: DC
  Administered 2016-06-23: 12:00:00 via INTRAVENOUS

## 2016-06-23 MED ORDER — NITROGLYCERIN 2 % TD OINT
0.5000 [in_us] | TOPICAL_OINTMENT | Freq: Four times a day (QID) | TRANSDERMAL | Status: DC
  Filled 2016-06-23: qty 30

## 2016-06-23 MED ORDER — FUROSEMIDE 10 MG/ML IJ SOLN
20.0000 mg | Freq: Once | INTRAMUSCULAR | Status: AC
  Administered 2016-06-23: 20 mg via INTRAVENOUS
  Filled 2016-06-23: qty 2

## 2016-06-23 MED ORDER — INSULIN ASPART 100 UNIT/ML ~~LOC~~ SOLN
0.0000 [IU] | Freq: Three times a day (TID) | SUBCUTANEOUS | Status: DC
  Administered 2016-06-23: 3 [IU] via SUBCUTANEOUS
  Administered 2016-06-24: 5 [IU] via SUBCUTANEOUS
  Administered 2016-06-24: 2 [IU] via SUBCUTANEOUS
  Administered 2016-06-24: 1 [IU] via SUBCUTANEOUS
  Administered 2016-06-25: 2 [IU] via SUBCUTANEOUS
  Administered 2016-06-25: 1 [IU] via SUBCUTANEOUS
  Administered 2016-06-26: 2 [IU] via SUBCUTANEOUS
  Administered 2016-06-26: 1 [IU] via SUBCUTANEOUS

## 2016-06-23 MED ORDER — MORPHINE SULFATE (PF) 4 MG/ML IV SOLN: 1.0000 mg | INTRAVENOUS | Status: DC | PRN

## 2016-06-23 MED ORDER — LEVOTHYROXINE SODIUM 112 MCG PO TABS
112.0000 ug | ORAL_TABLET | Freq: Every day | ORAL | Status: DC
  Administered 2016-06-24 – 2016-06-26 (×3): 112 ug via ORAL
  Filled 2016-06-23 (×3): qty 1

## 2016-06-23 MED ORDER — ISOSORBIDE MONONITRATE ER 30 MG PO TB24: 30.0000 mg | ORAL_TABLET | Freq: Every day | ORAL | Status: DC

## 2016-06-23 MED ORDER — SODIUM CHLORIDE 0.9 % IV SOLN
INTRAVENOUS | Status: AC
  Administered 2016-06-23: 16:00:00 via INTRAVENOUS

## 2016-06-23 MED ORDER — SODIUM CHLORIDE 0.9 % IV SOLN: INTRAVENOUS | Status: DC

## 2016-06-23 MED ORDER — ONDANSETRON HCL 4 MG/2ML IJ SOLN: 4.0000 mg | Freq: Four times a day (QID) | INTRAMUSCULAR | Status: DC | PRN

## 2016-06-23 MED ORDER — PANTOPRAZOLE SODIUM 40 MG IV SOLR
40.0000 mg | Freq: Two times a day (BID) | INTRAVENOUS | Status: DC
  Administered 2016-06-23 – 2016-06-24 (×2): 40 mg via INTRAVENOUS
  Filled 2016-06-23: qty 40

## 2016-06-23 MED ORDER — ATORVASTATIN CALCIUM 10 MG PO TABS
10.0000 mg | ORAL_TABLET | Freq: Every day | ORAL | Status: DC
  Administered 2016-06-24 – 2016-06-26 (×3): 10 mg via ORAL
  Filled 2016-06-23 (×3): qty 1

## 2016-06-23 MED ORDER — SODIUM CHLORIDE 0.9 % IV SOLN: Freq: Once | INTRAVENOUS | Status: DC

## 2016-06-23 MED ORDER — AMIODARONE HCL 100 MG PO TABS
100.0000 mg | ORAL_TABLET | Freq: Every day | ORAL | Status: DC
  Administered 2016-06-23 – 2016-06-26 (×4): 100 mg via ORAL
  Filled 2016-06-23: qty 1

## 2016-06-23 MED ORDER — CARVEDILOL 12.5 MG PO TABS
12.5000 mg | ORAL_TABLET | Freq: Two times a day (BID) | ORAL | Status: DC
  Administered 2016-06-24 – 2016-06-26 (×5): 12.5 mg via ORAL
  Filled 2016-06-23 (×5): qty 1

## 2016-06-23 MED ORDER — SODIUM CHLORIDE 0.9% FLUSH
3.0000 mL | Freq: Two times a day (BID) | INTRAVENOUS | Status: DC
  Administered 2016-06-23 – 2016-06-25 (×4): 3 mL via INTRAVENOUS

## 2016-06-23 MED ORDER — FINASTERIDE 5 MG PO TABS
5.0000 mg | ORAL_TABLET | Freq: Every day | ORAL | Status: DC
  Administered 2016-06-24 – 2016-06-26 (×3): 5 mg via ORAL
  Filled 2016-06-23 (×3): qty 1

## 2016-06-23 MED ORDER — NITROGLYCERIN 0.4 MG SL SUBL
0.4000 mg | SUBLINGUAL_TABLET | Freq: Once | SUBLINGUAL | Status: AC
  Administered 2016-06-23: 0.4 mg via SUBLINGUAL
  Filled 2016-06-23: qty 1

## 2016-06-23 NOTE — H&P (Signed)
[facility] History and Physical  MARQUIZE [name] OJJ:[id number] DOB: 1923/06/02 DOA: 06/23/2016   PCP: Deland Pretty, MD  Specialists: He has seen Dr. Paulita Fujita in the past. [name] is his cardiologist. Dr. Rayann Heman is his electrophysiologist.  Chief Complaint: Rectal bleeding since Sunday  HPI: Evan [name] is a 83 y.o. male with a past medical history of coronary artery disease status post CABG and stent placement in 2015, history of pacemaker placement, history of paroxysmal atrial fibrillation but not on anticoagulation, history of aortic stenosis history of diabetes, hypertension, who was in his usual state of health until Sunday morning when he had an urge to use the bathroom. However, before he could make to the bathroom blood started coming out of his rectum. He felt somewhat dizzy, lightheaded. He finally reach the bathroom and had a large episode of rectal bleeding. Throughout the day. He had smaller episodes of rectal bleeding, however, did appear to be subsiding. By Monday morning, it had subsided. He did not seek any attention. He denied any abdominal pain. He did feels nauseated but did not have any vomiting. Denies any heartburns. However, he did have some chest pressure last night for which he had to take nitroglycerin. Also had chest pressure this morning, which has improved. He had similar episode about 9 months ago for which she only saw his primary care provider. He did not undergo any procedures. This morning he again had the urge to go to the bathroom and passed dark blood. At that point in time, he decided to seek attention.  In the emergency department, patient was found to have maroon stool on rectal examination. He was hemodynamically stable. Hemoglobin was 8, which was lower than his baseline.  Home Medications: Prior to Admission medications   Medication Sig Start Date End Date Taking? Authorizing Provider  amiodarone (PACERONE) 200 MG tablet Take 0.5 tablets  (100 mg total) by mouth daily. 04/13/16  Yes Allred, Jeneen Rinks, MD  amLODipine (NORVASC) 5 MG tablet Take 1 tablet (5 mg total) by mouth daily. 04/06/16  Yes Nahser, Wonda Cheng, MD  aspirin 81 MG chewable tablet Chew 1 tablet (81 mg total) by mouth daily. 12/13/13  Yes Kilroy, Luke K, PA-C  atorvastatin (LIPITOR) 10 MG tablet Take 1 tablet (10 mg total) by mouth daily. 04/04/15  Yes [name], MD  carvedilol (COREG) 12.5 MG tablet TAKE 1 TABLET BY MOUTH TWICE DAILY WITH A MEAL 04/13/16  Yes [name], MD  clopidogrel (PLAVIX) 75 MG tablet Take 1 tablet (75 mg total) by mouth daily with breakfast. 06/26/15  Yes [name], MD  finasteride (PROSCAR) 5 MG tablet Take 5 mg by mouth daily.   Yes [provider]  furosemide (LASIX) 80 MG tablet Take 80 mg by mouth daily.   Yes [provider]  isosorbide mononitrate (IMDUR) 30 MG 24 hr tablet Take 1 tablet (30 mg total) by mouth daily. 06/13/15  Yes [name], MD  JANUVIA 50 MG tablet Take 50 mg by mouth daily. 03/31/16  Yes [provider]  levothyroxine (SYNTHROID, LEVOTHROID) 112 MCG tablet Take 112 mcg by mouth daily before breakfast.   Yes [provider]  nitroGLYCERIN (NITROSTAT) 0.4 MG SL tablet PLACE 1 TABLET (0.4 MG TOTAL) UNDER THE TONGUE EVERY 5 (FIVE) MINUTES AS NEEDED FOR CHEST PAIN. 04/04/15  Yes [name], MD    Allergies:  Allergies  Allergen Reactions  . Clonidine Derivatives     unknown reaction   .  Codeine     unknown reaction   . Meperidine Hcl     REACTION: Hypotension  . Motrin [Ibuprofen]     unknown reaction   . Other     Celery - unknown reaction  . Penicillins     unknown reaction   . Demerol Other (See Comments)    hypotension  . Heparin Other (See Comments)    hypotension  . Metformin And Related Diarrhea    Past Medical History: Past Medical History:  Diagnosis Date  . AC joint dislocation   . ALLERGIC RHINITIS   . Allergic rhinitis   . Anginal pain  ([place])   . Arthritis   . Atrial tachycardia ([place])   . BPH (benign prostatic hyperplasia)   . CAD   . CHF (congestive heart failure) ([place])   . Diarrhea   . DM    type 2  . GERD   . GERD (gastroesophageal reflux disease)   . H/O: GI bleed   . HYPERCHOLESTEROLEMIA   . HYPERTENSION   . Iron deficiency anemia   . Nephrolithiasis   . NEPHROLITHIASIS, HX OF   . Paroxysmal atrial fibrillation (HCC)   . Presence of permanent cardiac pacemaker 12/12/2013  . Rib fracture   . Stable angina (HCC)   . Syncope   . Thyroid disease   . TRANSIENT ISCHEMIC ATTACKS, HX OF   . 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 Urinary frequency   . Vertigo   . Weakness of left leg     Past Surgical History:  Procedure Laterality Date  . AV FISTULA REPAIR    . BACK SURGERY  2000'S  . CARDIAC CATHETERIZATION  12/11/2013   Procedure: CORONARY STENT INTERVENTION;  Surgeon: Burnell Blanks, MD;  Location: [facility] CATH LAB;  Service: Cardiovascular;;  DES 3.5 x 15 Resolute to seq SVG of OM1/OM2   . CORONARY ARTERY BYPASS GRAFT  1996  . CORONARY STENT PLACEMENT  12/11/13   SVG-OM DES  . FEMORAL ARTERY ANEURYSM REPAIR     feroral pseudo-aneurysm repair  . INSERT / REPLACE / REMOVE PACEMAKER    . LEFT HEART CATHETERIZATION WITH CORONARY/GRAFT ANGIOGRAM N/A 12/11/2013   Procedure: LEFT HEART CATHETERIZATION WITH Beatrix Fetters;  Surgeon: Burnell Blanks, MD;  Location: [facility] CATH LAB;  Service: Cardiovascular;  Laterality: N/A;  . PERMANENT PACEMAKER INSERTION N/A 12/12/2013   MDT ADDRL1 pacemaker implnated by Dr Rayann Heman  . PTCA  1980's    Social History: He lives in an independent living facility. He denies smoking, alcohol use or illicit drug use. Uses a walker to ambulate.   Family History:  Family History  Problem Relation Age of Onset  . Diabetes Mother   . Coronary artery disease Father      Review of Systems - History obtained from the patient General ROS:  positive for  - fatigue Psychological ROS: negative Ophthalmic ROS: negative ENT ROS: positive for - Hearing impairment, wearing hearing aids Allergy and Immunology ROS: negative Hematological and Lymphatic ROS: positive for - bleeding problems Endocrine ROS: negative Respiratory ROS: no cough, shortness of breath, or wheezing Cardiovascular ROS: As in history of present illness Gastrointestinal ROS: As in history of present illness Genito-Urinary ROS: no dysuria, trouble voiding, or hematuria Musculoskeletal ROS: negative Neurological ROS: no TIA or stroke symptoms Dermatological ROS: negative  Physical Examination  Vitals:   06/23/16 1123 06/23/16 1125  BP: (!) 134/51   Pulse: 62   Resp: 18  Temp: 98 F (36.7 C)   TempSrc: Oral   SpO2: 100%   Weight:  70.8 kg (156 lb)  Height:  5\' 7"  (1.702 m)    BP (!) 134/51 (BP Location: Right Arm)   Pulse 62   Temp 98 F (36.7 C) (Oral)   Resp 18   Ht 5\' 7"  (1.702 m)   Wt 70.8 kg (156 lb)   SpO2 100%   BMI 24.43 kg/m   General appearance: alert, cooperative, appears stated age and no distress Head: Normocephalic, without obvious abnormality, atraumatic Eyes: conjunctivae/corneas clear. PERRL, EOM's intact. Throat: lips, mucosa, and tongue normal; teeth and gums normal Neck: no adenopathy, no carotid bruit, no JVD, supple, symmetrical, trachea midline and thyroid not enlarged, symmetric, no tenderness/mass/nodules Resp: clear to auscultation bilaterally Cardio: S1, S2 is normal, regular. Systolic murmur appreciated over the precordium. No S3, S4. No rubs, or bruit GI: Abdomen is soft. Nontender, nondistended. Bowel sounds are present. Some urge to urinate when palpated over the suprapubic area but no bladder distention appreciated. Extremities: extremities normal, atraumatic, no cyanosis or edema Pulses: 2+ and symmetric Skin: Skin color, texture, turgor normal. No rashes or lesions Lymph nodes: Cervical, supraclavicular, and  axillary nodes normal. Neurologic: Awake and alert. Oriented 3. No focal neurological deficits    Labs on Admission: I have personally reviewed following labs and imaging studies  CBC:  Recent Labs Lab 06/23/16 1139  WBC 7.1  NEUTROABS 5.5  HGB 8.0*  HCT 24.4*  MCV 81.1  PLT 443   Basic Metabolic Panel:  Recent Labs Lab 06/23/16 1139  NA 141  K 4.5  CL 109  CO2 22  GLUCOSE 194*  BUN 27*  CREATININE 1.50*  CALCIUM 8.9   GFR: Estimated Creatinine Clearance: 30.6 mL/min (A) (by C-G formula based on SCr of 1.5 mg/dL (H)).  Liver Function Tests:  Recent Labs Lab 06/23/16 1139  AST 21  ALT 16*  ALKPHOS 59  BILITOT 0.8  PROT 6.5  ALBUMIN 4.2   Coagulation Profile:  Recent Labs Lab 06/23/16 1139  INR 1.11    My interpretation of Electrocardiogram: Pending   Problem List  Principal Problem:   Rectal bleeding Active Problems:   Type 2 diabetes with nephropathy [facility])   Essential hypertension   CABG '96   CARDIAC MURMUR   CAD (coronary artery disease) of artery bypass graft   CAD S/P SVG-MO1/OM2 DES 12/11/13   Pacemaker implanted 12/12/13 (MDT)   Stage 3 chronic renal impairment associated with type 2 diabetes mellitus ([place])   Assessment: This is a 83 year old Caucasian male with a past medical history as stated earlier, who presents with 2 day history of rectal bleeding. He also developed chest pressure twice during these 2 days, which could be suggestive of angina. He does have a history of coronary artery disease. He appears to be experiencing lower GI bleed.  Plan: #1 Rectal bleeding: Based on history, this appears to be lower GI bleed. It appears that he last underwent colonoscopy in 2011 which showed diverticulosis. There were to close this seems to be the most plausible reason for his bleeding. ED physician is contacting [facility] gastroenterology. Patient will be kept nothing by mouth. PPI will be given for now. Monitor CBCs.  #2 Acute blood loss  anemia: Hemoglobin is lower than his baseline. Since he is experiencing some anginal symptoms he will be given blood transfusion with 2 units of blood. Lasix in between transfusions. Check CBC posttransfusion.  #3 Chest pressure in the  setting of coronary artery disease: Check EKG and troponin. This could be angina due to his anemia. He'll be transfused. He'll be monitored on telemetry. He is on aspirin and Plavix. He had drug-eluting stent placed in 2015. Safe to hold these due to his GI bleed for now. If his troponin noted to be elevated, we will consult cardiology. Transfusion should help his anginal symptoms. Continue his beta blocker, nitroglycerin.   #4 history of atrial fibrillation: Continue amiodarone. He is not on anticoagulation.  #5 history of essential hypertension: Blood pressure stable. Continue to monitor closely.  #6 history of type 2 diabetes with nephropathy: Monitor CBGs. Sliding scale insulin coverage.  #7 history of chronic kidney disease, stage III: Monitor creatinine closely. Monitor urine output.  #8 history of pacemaker placement.   DVT Prophylaxis: SCDs Code Status: Discussed with the patient. He is DO NOT RESUSCITATE Family Communication: Discussed with the patient  Consults called: EDP is calling gastroenterology   Severity of Illness: The appropriate patient status for this patient is INPATIENT. Inpatient status is judged to be reasonable and necessary in order to provide the required intensity of service to ensure the patient's safety. The patient's presenting symptoms, physical exam findings, and initial radiographic and laboratory data in the context of their chronic comorbidities is felt to place them at high risk for further clinical deterioration. Furthermore, it is not anticipated that the patient will be medically stable for discharge from the hospital within 2 midnights of admission. The following factors support the patient status of inpatient.   " The  patient's presenting symptoms include rectal bleeding. " The worrisome physical exam findings include cardiac murmur. " The initial radiographic and laboratory data are worrisome because of anemia. " The chronic co-morbidities include coronary artery disease.   * I certify that at the point of admission it is my clinical judgment that the patient will require inpatient hospital care spanning beyond 2 midnights from the point of admission due to high intensity of service, high risk for further deterioration and high frequency of surveillance required.*    Further management decisions will depend on results of further testing and patient's response to treatment.   [facility]  [facility] Pager [phone]  If 7PM-7AM, please contact night-coverage [email or web address] Password [facility]  06/23/2016, 12:48 PM

## 2016-06-23 NOTE — Progress Notes (Signed)
CRITICAL VALUE ALERT  Critical Value:  Troponin 0.24  Date & Time Notied:  5449  Provider Notified: 2010  Orders Received/Actions taken:  Nitro paste, morphine. MD consulting Cardiology

## 2016-06-23 NOTE — ED Provider Notes (Signed)
[facility] DEPT Provider Note   CSN: [id number] Arrival date & time: 06/23/16  1055     History   Chief Complaint Chief Complaint  Patient presents with  . GI Bleeding    HPI Evan [name] is a 83 y.o. male.  83 year old male presents with one-day history of rectal bleeding. He does take Plavix chronically. Denies any fever or chills. No vomiting noted. No abdominal discomfort. States he is weak at baseline and notices no difference. Was sitting on the toilet and passed a large dark stools been followed by a stool that was dark with mixed bright red blood. No prior history of rectal bleeding. No treatment use prior to arrival and called EMS for transport here.      Past Medical History:  Diagnosis Date  . AC joint dislocation   . ALLERGIC RHINITIS   . Allergic rhinitis   . Anginal pain ([place])   . Arthritis   . Atrial tachycardia ([place])   . BPH (benign prostatic hyperplasia)   . CAD   . CHF (congestive heart failure) ([place])   . Diarrhea   . DM    type 2  . GERD   . GERD (gastroesophageal reflux disease)   . H/O: GI bleed   . HYPERCHOLESTEROLEMIA   . HYPERTENSION   . Iron deficiency anemia   . Nephrolithiasis   . NEPHROLITHIASIS, HX OF   . Paroxysmal atrial fibrillation (HCC)   . Presence of permanent cardiac pacemaker 12/12/2013  . Rib fracture   . Stable angina (HCC)   . Syncope   . Thyroid disease   . TRANSIENT ISCHEMIC ATTACKS, HX OF   . 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 Urinary frequency   . Vertigo   . Weakness of left leg     Patient Active Problem List   Diagnosis Date Noted  . Patient had 2 or more falls in past year 01/20/2014  . Stage 3 chronic renal impairment associated with type 2 diabetes mellitus ([place]) 01/20/2014  . Acute on chronic systolic and diastolic heart failure, NYHA class 4 ([place]) 01/16/2014  . CAD S/P SVG-MO1/OM2 DES 12/11/13 12/13/2013  . Pacemaker implanted 12/12/13 (MDT) 12/13/2013  .  Complete heart block ([place]) 12/13/2013  . HOH (hard of hearing) 12/13/2013  . Syncope 12/12/2013  . 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 Sick sinus syndrome ([place]) 12/10/2013  . PAF (paroxysmal atrial fibrillation) ([place]) 12/10/2013  . Tachy-brady syndrome ([place]) 12/10/2013  . Symptomatic bradycardia 12/07/2013  . Heart block 03/22/2013  . CAD (coronary artery disease) of artery bypass graft 03/23/2012  . Angina pectoris- rate related 03/23/2012  . Carotid bruit 03/23/2012  . CARDIAC MURMUR 12/03/2009  . Type 2 diabetes with nephropathy ([place]) 12/02/2009  . HYPERCHOLESTEROLEMIA 12/02/2009  . ANEMIA 12/02/2009  . Essential hypertension 12/02/2009  . CABG '96 12/02/2009  . ALLERGIC RHINITIS 12/02/2009  . GERD 12/02/2009  . TRANSIENT ISCHEMIC ATTACKS, HX OF 12/02/2009  . NEPHROLITHIASIS, HX OF 12/02/2009    Past Surgical History:  Procedure Laterality Date  . AV FISTULA REPAIR    . BACK SURGERY  2000'S  . CARDIAC CATHETERIZATION  12/11/2013   Procedure: CORONARY STENT INTERVENTION;  Surgeon: Burnell Blanks, MD;  Location: [facility] CATH LAB;  Service: Cardiovascular;;  DES 3.5 x 15 Resolute to seq SVG of OM1/OM2   . CORONARY ARTERY BYPASS GRAFT  1996  . CORONARY STENT PLACEMENT  12/11/13   SVG-OM DES  . FEMORAL ARTERY ANEURYSM REPAIR  feroral pseudo-aneurysm repair  . INSERT / REPLACE / REMOVE PACEMAKER    . LEFT HEART CATHETERIZATION WITH CORONARY/GRAFT ANGIOGRAM N/A 12/11/2013   Procedure: LEFT HEART CATHETERIZATION WITH Beatrix Fetters;  Surgeon: Burnell Blanks, MD;  Location: [facility] CATH LAB;  Service: Cardiovascular;  Laterality: N/A;  . PERMANENT PACEMAKER INSERTION N/A 12/12/2013   MDT ADDRL1 pacemaker implnated by Dr Rayann Heman  . PTCA  1980's       Home Medications    Prior to Admission medications   Medication Sig Start Date End Date Taking? Authorizing Provider  amiodarone (PACERONE) 200 MG tablet Take 0.5 tablets (100 mg total) by mouth daily. 04/13/16  Yes  Allred, Jeneen Rinks, MD  amLODipine (NORVASC) 5 MG tablet Take 1 tablet (5 mg total) by mouth daily. 04/06/16  Yes Nahser, Wonda Cheng, MD  aspirin 81 MG chewable tablet Chew 1 tablet (81 mg total) by mouth daily. 12/13/13  Yes Kilroy, Luke K, PA-C  atorvastatin (LIPITOR) 10 MG tablet Take 1 tablet (10 mg total) by mouth daily. 04/04/15  Yes [name], MD  carvedilol (COREG) 12.5 MG tablet TAKE 1 TABLET BY MOUTH TWICE DAILY WITH A MEAL 04/13/16  Yes [name], MD  clopidogrel (PLAVIX) 75 MG tablet Take 1 tablet (75 mg total) by mouth daily with breakfast. 06/26/15  Yes [name], MD  finasteride (PROSCAR) 5 MG tablet Take 5 mg by mouth daily.   Yes [provider]  furosemide (LASIX) 80 MG tablet Take 80 mg by mouth daily.   Yes [provider]  isosorbide mononitrate (IMDUR) 30 MG 24 hr tablet Take 1 tablet (30 mg total) by mouth daily. 06/13/15  Yes [name], MD  JANUVIA 50 MG tablet Take 50 mg by mouth daily. 03/31/16  Yes [provider]  levothyroxine (SYNTHROID, LEVOTHROID) 112 MCG tablet Take 112 mcg by mouth daily before breakfast.   Yes [provider]  nitroGLYCERIN (NITROSTAT) 0.4 MG SL tablet PLACE 1 TABLET (0.4 MG TOTAL) UNDER THE TONGUE EVERY 5 (FIVE) MINUTES AS NEEDED FOR CHEST PAIN. 04/04/15  Yes [name], MD    Family History Family History  Problem Relation Age of Onset  . Diabetes Mother   . Coronary artery disease Father     Social History Social History  Substance Use Topics  . Smoking status: Former Smoker    Quit date: 01/06/1956  . Smokeless tobacco: Never Used  . Alcohol use No     Allergies   Clonidine derivatives; Codeine; Meperidine hcl; Motrin [ibuprofen]; Other; Penicillins; Demerol; Heparin; and Metformin and related   Review of Systems Review of Systems  All other systems reviewed and are negative.    Physical Exam Updated Vital Signs BP (!) 134/51 (BP Location: Right Arm)   Pulse 62   Temp 98  F (36.7 C) (Oral)   Resp 18   Ht 1.702 m (5\' 7" )   Wt 70.8 kg (156 lb)   SpO2 100%   BMI 24.43 kg/m   Physical Exam  Constitutional: He is oriented to person, place, and time. He appears well-developed and well-nourished.  Non-toxic appearance. No distress.  HENT:  Head: Normocephalic and atraumatic.  Eyes: Conjunctivae, EOM and lids are normal. Pupils are equal, round, and reactive to light.  Neck: Normal range of motion. Neck supple. No tracheal deviation present. No thyroid mass present.  Cardiovascular: Normal rate, regular rhythm and normal heart sounds.  Exam reveals no gallop.   No murmur heard. Pulmonary/Chest: Effort normal  and breath sounds normal. No stridor. No respiratory distress. He has no decreased breath sounds. He has no wheezes. He has no rhonchi. He has no rales.  Abdominal: Soft. Normal appearance and bowel sounds are normal. He exhibits no distension. There is no tenderness. There is no rebound and no CVA tenderness.  Genitourinary: Rectal exam shows no external hemorrhoid and no internal hemorrhoid.  Genitourinary Comments: Maroon colored stools noted  Musculoskeletal: Normal range of motion. He exhibits no edema or tenderness.  Neurological: He is alert and oriented to person, place, and time. He has normal strength. No cranial nerve deficit or sensory deficit. GCS eye subscore is 4. GCS verbal subscore is 5. GCS motor subscore is 6.  Skin: Skin is warm and dry. No abrasion and no rash noted.  Psychiatric: He has a normal mood and affect. His speech is normal and behavior is normal.  Nursing note and vitals reviewed.    ED Treatments / Results  Labs (all labs ordered are listed, but only abnormal results are displayed) Labs Reviewed  CBC WITH DIFFERENTIAL/PLATELET  COMPREHENSIVE METABOLIC PANEL  PROTIME-INR  APTT  TYPE AND SCREEN    EKG  EKG Interpretation None       Radiology No results found.  Procedures Procedures (including critical care  time)  Medications Ordered in ED Medications  0.9 %  sodium chloride infusion (not administered)     Initial Impression / Assessment and Plan / ED Course  I have reviewed the triage vital signs and the nursing notes.  Pertinent labs & imaging results that were available during my care of the patient were reviewed by me and considered in my medical decision making (see chart for details).     Patient to be admitted for evaluation of GI bleeding  Final Clinical Impressions(s) / ED Diagnoses   Final diagnoses:  None    New Prescriptions New Prescriptions   No medications on file     Lacretia Leigh, MD 06/27/16 2354

## 2016-06-23 NOTE — Consult Note (Signed)
Reason for Consult: GI bleed Referring Physician: Hospital team  Evan [name] is an 83 y.o. male.  HPI: Patient seen and examined and his hospital computer chart was reviewed and he been in his normal state of health until Sunday when he had significant bright red blood per rectum and he did take some extra Excedrin for back pain and then on Monday was actually okay but had darker stools today and presented to the emergency room and he did have more of maroon-colored today but is not having any pain and his previous workup in 2011 was reviewed and he lives by himself and he is on both an aspirin a day and Plavix but also takes some sleep aids which he does not know the name of and some Excedrin but has no other complaints although does have a fair amount of chronic chest pain  Past Medical History:  Diagnosis Date  . AC joint dislocation   . ALLERGIC RHINITIS   . Allergic rhinitis   . Anginal pain ([place])   . Arthritis   . Atrial tachycardia ([place])   . BPH (benign prostatic hyperplasia)   . CAD   . CHF (congestive heart failure) ([place])   . Diarrhea   . DM    type 2  . GERD   . GERD (gastroesophageal reflux disease)   . H/O: GI bleed   . HYPERCHOLESTEROLEMIA   . HYPERTENSION   . Iron deficiency anemia   . Nephrolithiasis   . NEPHROLITHIASIS, HX OF   . Paroxysmal atrial fibrillation (HCC)   . Presence of permanent cardiac pacemaker 12/12/2013  . Rib fracture   . Stable angina (HCC)   . Syncope   . Thyroid disease   . TRANSIENT ISCHEMIC ATTACKS, HX OF   . 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 Urinary frequency   . Vertigo   . Weakness of left leg     Past Surgical History:  Procedure Laterality Date  . AV FISTULA REPAIR    . BACK SURGERY  2000'S  . CARDIAC CATHETERIZATION  12/11/2013   Procedure: CORONARY STENT INTERVENTION;  Surgeon: Burnell Blanks, MD;  Location: [facility]ATH LAB;  Service: Cardiovascular;;  DES 3.5 x 15 Resolute to seq SVG of  OM1/OM2   . CORONARY ARTERY BYPASS GRAFT  1996  . CORONARY STENT PLACEMENT  12/11/13   SVG-OM DES  . FEMORAL ARTERY ANEURYSM REPAIR     feroral pseudo-aneurysm repair  . INSERT / REPLACE / REMOVE PACEMAKER    . LEFT HEART CATHETERIZATION WITH CORONARY/GRAFT ANGIOGRAM N/A 12/11/2013   Procedure: LEFT HEART CATHETERIZATION WITH Beatrix Fetters;  Surgeon: Burnell Blanks, MD;  Location: [facility] CATH LAB;  Service: Cardiovascular;  Laterality: N/A;  . PERMANENT PACEMAKER INSERTION N/A 12/12/2013   MDT ADDRL1 pacemaker implnated by Dr Rayann Heman  . PTCA  1980's    Family History  Problem Relation Age of Onset  . Diabetes Mother   . Coronary artery disease Father     Social History:  reports that he quit smoking about 60 years ago. He has never used smokeless tobacco. He reports that he does not drink alcohol or use drugs.  Allergies:  Allergies  Allergen Reactions  . Clonidine Derivatives     unknown reaction   . Codeine     unknown reaction   . Meperidine Hcl     REACTION: Hypotension  . Motrin [Ibuprofen]     unknown reaction   .  Other     Celery - unknown reaction  . Penicillins     unknown reaction   . Demerol Other (See Comments)    hypotension  . Heparin Other (See Comments)    hypotension  . Metformin And Related Diarrhea    Medications: I have reviewed the patient's current medications.  Results for orders placed or performed during the hospital encounter of 06/23/16 (from the past 48 hour(s))  ABO/Rh     Status: None   Collection Time: 06/23/16 10:55 AM  Result Value Ref Range   ABO/RH(D) O POS   CBC with Differential/Platelet     Status: Abnormal   Collection Time: 06/23/16 11:39 AM  Result Value Ref Range   WBC 7.1 4.0 - 10.5 K/uL   RBC 3.01 (L) 4.22 - 5.81 MIL/uL   Hemoglobin 8.0 (L) 13.0 - 17.0 g/dL   HCT 24.4 (L) 39.0 - 52.0 %   MCV 81.1 78.0 - 100.0 fL   MCH 26.6 26.0 - 34.0 pg   MCHC 32.8 30.0 - 36.0 g/dL   RDW 15.9 (H) 11.5 - 15.5 %    Platelets 188 150 - 400 K/uL   Neutrophils Relative % 77 %   Neutro Abs 5.5 1.7 - 7.7 K/uL   Lymphocytes Relative 17 %   Lymphs Abs 1.2 0.7 - 4.0 K/uL   Monocytes Relative 5 %   Monocytes Absolute 0.4 0.1 - 1.0 K/uL   Eosinophils Relative 1 %   Eosinophils Absolute 0.1 0.0 - 0.7 K/uL   Basophils Relative 0 %   Basophils Absolute 0.0 0.0 - 0.1 K/uL  Comprehensive metabolic panel     Status: Abnormal   Collection Time: 06/23/16 11:39 AM  Result Value Ref Range   Sodium 141 135 - 145 mmol/L   Potassium 4.5 3.5 - 5.1 mmol/L   Chloride 109 101 - 111 mmol/L   CO2 22 22 - 32 mmol/L   Glucose, Bld 194 (H) 65 - 99 mg/dL   BUN 27 (H) 6 - 20 mg/dL   Creatinine, Ser 1.50 (H) 0.61 - 1.24 mg/dL   Calcium 8.9 8.9 - 10.3 mg/dL   Total Protein 6.5 6.5 - 8.1 g/dL   Albumin 4.2 3.5 - 5.0 g/dL   AST 21 15 - 41 U/L   ALT 16 (L) 17 - 63 U/L   Alkaline Phosphatase 59 38 - 126 U/L   Total Bilirubin 0.8 0.3 - 1.2 mg/dL   GFR calc non Af Amer 39 (L) >60 mL/min   GFR calc Af Amer 45 (L) >60 mL/min    Comment: (NOTE) The eGFR has been calculated using the CKD EPI equation. This calculation has not been validated in all clinical situations. eGFR's persistently <60 mL/min signify possible Chronic Kidney Disease.    Anion gap 10 5 - 15  Protime-INR     Status: None   Collection Time: 06/23/16 11:39 AM  Result Value Ref Range   Prothrombin Time 14.3 11.4 - 15.2 seconds   INR 1.11   APTT     Status: None   Collection Time: 06/23/16 11:39 AM  Result Value Ref Range   aPTT 25 24 - 36 seconds  Type and screen     Status: None (Preliminary result)   Collection Time: 06/23/16 11:39 AM  Result Value Ref Range   ABO/RH(D) O POS    Antibody Screen NEG    Sample Expiration 06/26/2016    Unit Number [id number]    Blood Component Type RED CELLS,LR  Unit division 00    Status of Unit ALLOCATED    Transfusion Status OK TO TRANSFUSE    Crossmatch Result Compatible    Unit Number [id number]     Blood Component Type RED CELLS,LR    Unit division 00    Status of Unit ALLOCATED    Transfusion Status OK TO TRANSFUSE    Crossmatch Result Compatible   Troponin I     Status: Abnormal   Collection Time: 06/23/16 12:40 PM  Result Value Ref Range   Troponin I 0.24 (HH) <0.03 ng/mL    Comment: CRITICAL RESULT CALLED TO, READ BACK BY AND VERIFIED WITH: G.KOEHN RN AT 1419 ON 06/23/16 BY S.VANHOORNE   Prepare RBC     Status: None   Collection Time: 06/23/16  3:00 PM  Result Value Ref Range   Order Confirmation ORDER PROCESSED BY BLOOD BANK     Dg Chest Port 1 View  Result Date: 06/23/2016 CLINICAL DATA:  83 y/o  M; chest pain. EXAM: PORTABLE CHEST 1 VIEW COMPARISON:  01/17/2014 chest radiograph FINDINGS: Stable mildly enlarged cardiac silhouette. Post median sternotomy. 2 lead pacemaker. Aortic atherosclerosis with calcification. Patchy opacities in the left mid and lower lung zone may represent pulmonary edema or developing pneumonia. No pleural effusion or pneumothorax. Bones are unremarkable. IMPRESSION: 1. Patchy opacities in left mid and lower lung zone may represent asymmetric edema or developing pneumonia. 2. Aortic atherosclerosis and mild cardiomegaly. Electronically Signed   By: Kristine Garbe M.D.   On: 06/23/2016 15:29    ROSNegative except above  Blood pressure (!) 150/49, pulse 63, temperature 97.7 F (36.5 C), temperature source Oral, resp. rate 14, height '5\' 7"'$  (1.702 m), weight 74.1 kg (163 lb 5.8 oz), SpO2 99 %. Physical ExamVital signs stable afebrile no acute distress lungs are clear regular rate and rhythm abdomen is soft nontender labs reviewed decreased hemoglobin increased BUN and creatinine  Assessment/Plan: GI blood loss probably lower however with his aspirin and Excedrin I think proceeding with a endoscopy just to make sure they are okay to restart in the future or to see if he needs chronic pump inhibitors is indicated and I discussed that with him and  we will proceed with that test tomorrow at  1 PM and will allow clear liquids until midnight and call sooner if signs of active bleeding  Evan [name] 06/23/2016, 3:33 PM

## 2016-06-23 NOTE — ED Notes (Signed)
Pt sat drop to 83-84 on room air. Pt place on 2 L [place] and sat at 95 % now.

## 2016-06-23 NOTE — ED Triage Notes (Signed)
Pt coming from [place] state with c/o GI bleed that started on Saturday with bright red stool and stop on Sunday and started back on Monday and this morning it was black stool. Pt denies any n/v. Pt denies pain to the abdomen at this time. Pt currently on aspirin and Plavix.

## 2016-06-23 NOTE — Progress Notes (Signed)
CSW acknowledged consult- pt lives in [place] [facility]. Will follow if higher level of care needed.

## 2016-06-23 NOTE — Consult Note (Addendum)
Cardiology Consultation:   Patient ID: Evan [name]; [id number]; [birth year]   Admit date: 06/23/2016 Date of Consult: 06/23/2016  Primary Care Provider: Deland Pretty, MD Primary Cardiologist: Johnsie Cancel Primary Electrophysiologist:  Allred   Patient Profile:   Evan [name] is a 83 y.o. male with a hx of Coronary artery disease post CABG with stent placement in 2015, pacemaker, paroxysmal atrial fibrillation not on anticoagulation, aortic regurgitation mild, diabetes with hypertension who is being seen today for the evaluation of elevated troponin and chest pressure at the request of [place] Pink.  History of Present Illness:   Mr. Evan [name] came in with chief complaint of rectal bleeding since Sunday after having the urge to use the bathroom. Before he could make it to the bathroom he started to note blood. He felt dizzy. He then had a large episode of rectal bleeding. This seemed to decrease by Monday. No abdominal pain although he felt nausea. No vomiting. He had a complaint of chest pressure on the night of 06/22/16 and took a nitroglycerin and had some chest pain as well earlier this morning. He passed some dark blood earlier this morning as well. At this point, he decided to come in.  He had maroon stool on rectal exam in the emergency room and his hemoglobin was 8, lower than baseline.  He currently feels as though he may be having some minimal discomfort, no real change with deep breathing or twisting his torso. He does think that some of his chest pain may have been exacerbated by him having to clean up "his mess ". He also states that both of his arms are slightly uncomfortable and he pointed to the IV in place and wonders if it is from blood draws and IV placement. Overall he seems quite pleasant currently and in no distress.    Past Medical History:  Diagnosis Date  . AC joint dislocation   . ALLERGIC RHINITIS   . Allergic rhinitis   . Anginal pain ([place])   . Arthritis   .  Atrial tachycardia ([place])   . BPH (benign prostatic hyperplasia)   . CAD   . CHF (congestive heart failure) ([place])   . Diarrhea   . DM    type 2  . GERD   . GERD (gastroesophageal reflux disease)   . H/O: GI bleed   . HYPERCHOLESTEROLEMIA   . HYPERTENSION   . Iron deficiency anemia   . Nephrolithiasis   . NEPHROLITHIASIS, HX OF   . Paroxysmal atrial fibrillation (HCC)   . Presence of permanent cardiac pacemaker 12/12/2013  . Rib fracture   . Stable angina (HCC)   . Syncope   . Thyroid disease   . TRANSIENT ISCHEMIC ATTACKS, HX OF   . 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 Urinary frequency   . Vertigo   . Weakness of left leg     Past Surgical History:  Procedure Laterality Date  . AV FISTULA REPAIR    . BACK SURGERY  2000'S  . CARDIAC CATHETERIZATION  12/11/2013   Procedure: CORONARY STENT INTERVENTION;  Surgeon: Burnell Blanks, MD;  Location: [facility] CATH LAB;  Service: Cardiovascular;;  DES 3.5 x 15 Resolute to seq SVG of OM1/OM2   . CORONARY ARTERY BYPASS GRAFT  1996  . CORONARY STENT PLACEMENT  12/11/13   SVG-OM DES  . FEMORAL ARTERY ANEURYSM REPAIR     feroral pseudo-aneurysm repair  . INSERT / REPLACE / REMOVE PACEMAKER    .  LEFT HEART CATHETERIZATION WITH CORONARY/GRAFT ANGIOGRAM N/A 12/11/2013   Procedure: LEFT HEART CATHETERIZATION WITH Beatrix Fetters;  Surgeon: Burnell Blanks, MD;  Location: [facility] CATH LAB;  Service: Cardiovascular;  Laterality: N/A;  . PERMANENT PACEMAKER INSERTION N/A 12/12/2013   MDT ADDRL1 pacemaker implnated by Dr Rayann Heman  . PTCA  1980's     Inpatient Medications: Scheduled Meds: . amiodarone  100 mg Oral Daily  . [START ON 06/24/2016] atorvastatin  10 mg Oral Daily  . [START ON 06/24/2016] carvedilol  12.5 mg Oral BID WC  . [START ON 06/24/2016] finasteride  5 mg Oral Daily  . furosemide  20 mg Intravenous Once  . furosemide  20 mg Intravenous Once  . [START ON 06/24/2016] furosemide  80 mg Oral  Daily  . insulin aspart  0-9 Units Subcutaneous TID WC  . levothyroxine  112 mcg Oral QAC breakfast  . nitroGLYCERIN  0.5 inch Topical Q6H  . pantoprazole (PROTONIX) IV  40 mg Intravenous Q12H  . sodium chloride flush  3 mL Intravenous Q12H   Continuous Infusions: . sodium chloride    . sodium chloride     PRN Meds: acetaminophen **OR** acetaminophen, morphine injection, ondansetron **OR** ondansetron (ZOFRAN) IV  Allergies:    Allergies  Allergen Reactions  . Clonidine Derivatives     unknown reaction   . Codeine     unknown reaction   . Meperidine Hcl     REACTION: Hypotension  . Motrin [Ibuprofen]     unknown reaction   . Other     Celery - unknown reaction  . Penicillins     unknown reaction   . Demerol Other (See Comments)    hypotension  . Heparin Other (See Comments)    hypotension  . Metformin And Related Diarrhea    Social History:   Social History   Social History  . Marital status: Married    Spouse name: N/A  . Number of children: N/A  . Years of education: N/A   Occupational History  . Not on file.   Social History Main Topics  . Smoking status: Former Smoker    Quit date: 01/06/1956  . Smokeless tobacco: Never Used  . Alcohol use No  . Drug use: No  . Sexual activity: Not Currently   Other Topics Concern  . Not on file   Social History Narrative   Pt is married and lives with his wife.  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      Family History:   The patient's family history includes Coronary artery disease in his father; Diabetes in his mother.  ROS:  Please see the history of present illness.  ROS  No syncope, fevers, orthopnea, PND, rash All other ROS reviewed and negative.     Physical Exam/Data:   Vitals:   06/23/16 1300 06/23/16 1400 06/23/16 1404 06/23/16 1419  BP: (!) 155/49 (!) 121/50  (!) 150/49  Pulse: 60 60  63  Resp: 14 14  14   Temp:    97.7 F (36.5 C)  TempSrc:     Oral  SpO2:   95% 99%  Weight:    163 lb 5.8 oz (74.1 kg)  Height:    5\' 7"  (1.702 m)   No intake or output data in the 24 hours ending 06/23/16 1505 Filed Weights   06/23/16 1125 06/23/16 1419  Weight: 156 lb (70.8 kg) 163 lb 5.8 oz (74.1 kg)  Body mass index is 25.59 kg/m.  General:  Elderly, in no acute distress HEENT: normal Lymph: no adenopathy Neck: no JVD Endocrine:  No thryomegaly Vascular: No carotid bruits; FA pulses 2+ bilaterally without bruits  Cardiac:  normal S1, S2; RRR; faint diastolic murmur pacemaker site intact Lungs:  clear to auscultation bilaterally, no wheezing, rhonchi or rales  Abd: soft, nontender, no hepatomegaly  Ext: no edema Musculoskeletal:  No deformities, BUE and BLE strength normal and equal Skin: warm and dry  Neuro:  CNs 2-12 intact, no focal abnormalities noted Psych:  Normal affect    EKG:  The EKG was personally reviewed and demonstrates Dual-chamber pacemaker.  Relevant CV Studies:  ECHO 2015  - Left ventricle: The cavity size was normal. Wall thickness was normal. Systolic function was mildly reduced. The estimated ejection fraction was in the range of 45% to 50%. Diffuse hypokinesis. Doppler parameters are consistent with abnormal left ventricular relaxation (grade 1 diastolic dysfunction). - Aortic valve: There was mild regurgitation. - Mitral valve: Calcified annulus. - Left atrium: The atrium was mildly dilated.  Laboratory Data:  Chemistry Recent Labs Lab 06/23/16 1139  NA 141  K 4.5  CL 109  CO2 22  GLUCOSE 194*  BUN 27*  CREATININE 1.50*  CALCIUM 8.9  GFRNONAA 39*  GFRAA 45*  ANIONGAP 10     Recent Labs Lab 06/23/16 1139  PROT 6.5  ALBUMIN 4.2  AST 21  ALT 16*  ALKPHOS 59  BILITOT 0.8   Hematology Recent Labs Lab 06/23/16 1139  WBC 7.1  RBC 3.01*  HGB 8.0*  HCT 24.4*  MCV 81.1  MCH 26.6  MCHC 32.8  RDW 15.9*  PLT 188   Cardiac Enzymes Recent Labs Lab 06/23/16 1240    TROPONINI 0.24*   No results for input(s): TROPIPOC in the last 168 hours.  BNPNo results for input(s): BNP, PROBNP in the last 168 hours.  DDimer No results for input(s): DDIMER in the last 168 hours.  Radiology/Studies:  No results found.  Assessment and Plan:   Elevated troponin/demand ischemia  - 0.24, mildly elevated in the setting of rectal bleed, maroon blood.  - He did have an accompanied chest pressure last night and earlier this morning. It is likely that he is experiencing a degree of demand ischemia given his underlying coronary artery disease in the setting of worsening anemia.  - Agree with transfusion, 2 units with Lasix in between.  - We will reevaluate his anginal symptoms post transfusion. Nitroglycerin when necessary, continue beta blocker as long as blood pressure permits.  - We will continue to treat medically. No IV heparin obviously given his recent hematochezia. Continue statin therapy. No aspirin therapy also given his recent bleed.  Dual-chamber pacemaker  - Functioning well. Stable.  Atrial tachycardia  - On amiodarone to help prevent.  - Dose recently adjusted to 100 mg a day on 04/13/16 in review of Dr. Jackalyn Lombard note.  Coronary artery disease  - Previous cardiac catheterization in 2015 revealed patent grafts.  - Continue with medical management, especially given his advanced age.  Aortic regurgitation  - Mild should be of no clinical significance  Lower GI bleeding  - If necessary, he may go forward with colonoscopy or other endoscopy procedure with moderate overall risk from a cardiac perspective.  We will follow along  Signed, Candee Furbish, MD  06/23/2016 3:05 PM

## 2016-06-23 NOTE — ED Notes (Signed)
Bed: WA03 Expected date:  Expected time:  Means of arrival:  Comments: EMS-GI bleed

## 2016-06-24 ENCOUNTER — Encounter (HOSPITAL_COMMUNITY)
Admission: EM | Disposition: A | Discharge status: Home Health | Payer: Self-pay | Source: Home / Self Care | Attending: Internal Medicine

## 2016-06-24 DIAGNOSIS — E1122 Type 2 diabetes mellitus with diabetic chronic kidney disease: Secondary | ICD-10-CM

## 2016-06-24 DIAGNOSIS — I10 Essential (primary) hypertension: Secondary | ICD-10-CM

## 2016-06-24 DIAGNOSIS — N183 Chronic kidney disease, stage 3 (moderate): Secondary | ICD-10-CM

## 2016-06-24 DIAGNOSIS — R011 Cardiac murmur, unspecified: Secondary | ICD-10-CM

## 2016-06-24 DIAGNOSIS — E1121 Type 2 diabetes mellitus with diabetic nephropathy: Secondary | ICD-10-CM

## 2016-06-24 DIAGNOSIS — I25810 Atherosclerosis of coronary artery bypass graft(s) without angina pectoris: Secondary | ICD-10-CM

## 2016-06-24 DIAGNOSIS — I21A1 Myocardial infarction type 2: Secondary | ICD-10-CM

## 2016-06-24 DIAGNOSIS — R079 Chest pain, unspecified: Secondary | ICD-10-CM

## 2016-06-24 DIAGNOSIS — I214 Non-ST elevation (NSTEMI) myocardial infarction: Secondary | ICD-10-CM

## 2016-06-24 LAB — BPAM RBC
Blood Product Expiration Date: 201806302359
Blood Product Expiration Date: 201807062359
ISSUE DATE / TIME: 201806191558
ISSUE DATE / TIME: 201806192243
UNIT TYPE AND RH: 5100
Unit Type and Rh: 5100

## 2016-06-24 LAB — BASIC METABOLIC PANEL
Anion gap: 10 (ref 5–15)
BUN: 20 mg/dL (ref 6–20)
CHLORIDE: 109 mmol/L (ref 101–111)
CO2: 24 mmol/L (ref 22–32)
CREATININE: 1.35 mg/dL — AB (ref 0.61–1.24)
Calcium: 8.3 mg/dL — ABNORMAL LOW (ref 8.9–10.3)
GFR calc Af Amer: 52 mL/min — ABNORMAL LOW (ref >60)
GFR, EST NON AFRICAN AMERICAN: 45 mL/min — AB (ref >60)
Glucose, Bld: 153 mg/dL — ABNORMAL HIGH (ref 65–99)
Potassium: 3.8 mmol/L (ref 3.5–5.1)
SODIUM: 143 mmol/L (ref 135–145)

## 2016-06-24 LAB — TYPE AND SCREEN
ABO/RH(D): O POS
Antibody Screen: NEGATIVE
UNIT DIVISION: 0
UNIT DIVISION: 0

## 2016-06-24 LAB — CBC
HEMATOCRIT: 31.7 % — AB (ref 39.0–52.0)
Hemoglobin: 10.2 g/dL — ABNORMAL LOW (ref 13.0–17.0)
MCH: 25.2 pg — ABNORMAL LOW (ref 26.0–34.0)
MCHC: 32.2 g/dL (ref 30.0–36.0)
MCV: 78.5 fL (ref 78.0–100.0)
Platelets: 184 10*3/uL (ref 150–400)
RBC: 4.04 MIL/uL — ABNORMAL LOW (ref 4.22–5.81)
RDW: 16.4 % — AB (ref 11.5–15.5)
WBC: 8.5 10*3/uL (ref 4.0–10.5)

## 2016-06-24 LAB — GLUCOSE, CAPILLARY
GLUCOSE-CAPILLARY: 156 mg/dL — AB (ref 65–99)
Glucose-Capillary: 131 mg/dL — ABNORMAL HIGH (ref 65–99)
Glucose-Capillary: 253 mg/dL — ABNORMAL HIGH (ref 65–99)
Glucose-Capillary: 95 mg/dL (ref 65–99)

## 2016-06-24 LAB — PROTIME-INR
INR: 1.08
Prothrombin Time: 14 seconds (ref 11.4–15.2)

## 2016-06-24 LAB — TROPONIN I: Troponin I: 8.21 ng/mL (ref <0.03)

## 2016-06-24 SURGERY — ESOPHAGOGASTRODUODENOSCOPY (EGD) WITH PROPOFOL
Anesthesia: Monitor Anesthesia Care

## 2016-06-24 MED ORDER — PANTOPRAZOLE SODIUM 40 MG PO TBEC
40.0000 mg | DELAYED_RELEASE_TABLET | Freq: Every day | ORAL | Status: DC
  Administered 2016-06-24 – 2016-06-26 (×3): 40 mg via ORAL
  Filled 2016-06-24 (×3): qty 1

## 2016-06-24 NOTE — Progress Notes (Signed)
PROGRESS NOTE    Evan [name]  PPJ:[id number] DOB: 1923/10/08 DOA: 06/23/2016 PCP: Deland Pretty, MD  Brief Narrative:  Evan [name] is a 83 y.o. male with a past medical history of coronary artery disease status post CABG and stent placement in 2015, history of pacemaker placement, history of paroxysmal atrial fibrillation but not on anticoagulation, hx of atrial tachycardia, history of aortic stenosis, history of diabetes, hypertension, and other comorbids who was in his usual state of health until Sunday morning when he had an urge to use the bathroom. However, before he could make to the bathroom blood started coming out of his rectum. He felt somewhat dizzy, lightheaded. He finally reach the bathroom and had a large episode of rectal bleeding. Throughout the day. He had smaller episodes of rectal bleeding, however, did appear to be subsiding. By Monday morning, it had subsided. He did not seek any attention. He denied any abdominal pain. He did have some chest pressure last night for which he had to take nitroglycerin. Also had chest pressure n the morning of admission, which has improved. He had similar episode about 9 months ago for which she only saw his primary care provider. The morning of admission he again had the urge to go to the bathroom and passed dark blood. At that point in time, he decided to seek attention. In the emergency department, patient was found to have maroon stool on rectal examination. He was transfused 2 units of pRBC's and Cardiology and Gastroenterology were consulted. Because patient's Troponin elevated to 8.21, patient was deemed a high risk for endoscopy and is currently being managed medically. His Chest Pain is now resolved.   Assessment & Plan:   Principal Problem:   Rectal bleeding Active Problems:   Type 2 diabetes with nephropathy ([place])   Essential hypertension   CABG '96   CARDIAC MURMUR   CAD (coronary artery disease) of artery bypass graft  CAD S/P SVG-MO1/OM2 DES 12/11/13   Pacemaker implanted 12/12/13 (MDT)   Stage 3 chronic renal impairment associated with type 2 diabetes mellitus (HCC)   Acute blood loss anemia  Acute suspected Lower GI Bleed/BRBPR/Rectal bleeding:  -Based on history, this appears to be lower GI bleed. -It appears that he last underwent colonoscopy in 2011 which showed diverticulosis. This seems to be the most plausible reason for his bleeding however patient was tacking Excedrin for back pain and was on ASA and Plavix -[facility] Consulted for further Recc's -Patient was to undergo an Endoscopy and was to undergo at 1 pm today but procedure was cancelled after Cardiology recommend holding colonoscopy at this time given his High Risk from a Cardiac Perspective as Troponin Level was 8.21 this AM -C/w IV Pantoprazole 40 mg q12h -Per GI EGD on Hold and can have Clear Liquid Diet for today and re-evaluate  ABLA from Above -Hemoglobin is lower than his baseline.  -Since he was experiencing some anginal symptoms he was transfused 2 units of PRBC's  -Lasix was given between transfusions.  -Hb/Hct went from 8.0/24.4 -> 10.2/31.7 -Continue to Monitor and repeat CBC in AM -Patient is still having some bright red blood per rectum  Type 2 NSTEMI in the setting of CAD and Anemia  -Checked EKG and Troponin. Patient is CP free now and EKG showed paced rhythm. -Troponin went from 0.24 -> 1.08 -> 8.21 -This could be angina due to his anemia. He was transfused 2 units of pRBC's -C/w Telemetry Monitoring -Wason aspirin and Plavix and had  drug-eluting stent placed in 2015 and held these due to his GI bleed for now. -Cardiology Dr.Skains Consulted and per Cards cannot receive traditional medications such as IV heparin, aspirin or Plavix (give his current bleeding) and therefore we will need to manage him   medically at this time. -C/w Atorvastatin 10 mg po Daily, and Carvedilol 12.5 mg po BID -C/w Morphine 1 mg  IV q2hprn for Severe CP and with NTG 2% Ointment 1 inch topically q6h  Atrial Tachycardia and Hx of PAF -Continue Amiodarone 100 mg po Daily for prevention.  -He is not on anticoagulation. -C/w Telemetry Monitoring   Essential Hypertension  -Blood pressure stable.  -C/w Carvedilol 12.5 mg po BID and with Furosemide 80 mg po Daily -Continue to monitor closely.  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 -Stable. C/w Telemetry Monitoring  Hypothyroidism -C/w Levothyroxine 112 mcg po Daily before Breakfast  BPH -C/w Finasteride 5 mg po Daily  Hx of Diastolic CHF -Currently not decompensated -C/w Carvedilol 12.5 mg po BID and Furosemide 80 mg po Daily -Cardiology Consulted and Following   Hyperlipidemia -C/w Atorvastatin 10 mg po Daily  DVT prophylaxis: SCDs Code Status: DO NOT RESUSCITATE Family Communication: (Specify name, relationship & date discussed. NO "discussed with patient") Disposition Plan: (specify when and where you expect patient to be discharged). Include barriers to DC in this tab.   Consultants:   Gastroenterology Dr. Watt Climes  Cardiology Dr. Marlou Porch   Procedures: None   Antimicrobials: Anti-infectives    None     Subjective: Seen and examined and denied any more chest pain. Did not know if he was still bleeding from rectum but per nursing report patient is still having some BRBPR but its not as much. Denied SOB or Nausea.   Objective: Vitals:   06/23/16 2305 06/24/16 0204 06/24/16 0448 06/24/16 0913  BP: (!) 132/43 (!) 167/50 (!) 159/52 (!) 156/48  Pulse: 60 (!) 59 60 61  Resp: 18 16 16    Temp: 98.5 F (36.9 C) 98.6 F (37 C) 98.1 F (36.7 C)    TempSrc: Oral Oral Oral   SpO2: 97% 94% 100%   Weight:      Height:        Intake/Output Summary (Last 24 hours) at 06/24/16 1345 Last data filed at 06/24/16 0204  Gross per 24 hour  Intake          1988.17 ml  Output              200 ml  Net          1788.17 ml   Filed Weights   06/23/16 1125 06/23/16 1419  Weight: 70.8 kg (156 lb) 74.1 kg (163 lb 5.8 oz)   Examination: Physical Exam:  Constitutional: elderly Caucasian Male who is NAD and appears calm and comfortable Eyes: Lids and conjunctivae normal, sclerae anicteric  ENMT: External Ears, Nose appear normal. Extremely hard of hearing. Neck: Appears normal, supple, no cervical masses, normal ROM, no appreciable thyromegaly, no visible JVD Respiratory: Clear to auscultation bilaterally, no wheezing, rales, rhonchi or crackles. Normal respiratory effort and patient is not tachypenic. No accessory muscle use.  Cardiovascular: RRR, loud Murmur appreciated. S1 and S2 auscultated. Some lower extremity  edema Abdomen: Soft, non-tender, non-distended. No masses palpated. No appreciable hepatosplenomegaly. Bowel sounds positive.  GU: Deferred. Musculoskeletal: No clubbing / cyanosis of digits/nails. No joint deformity upper and lower extremities.  Skin: No rashes, lesions, ulcers on a limited skin evaluation. No induration; Warm and dry.  Neurologic: CN 2-12 grossly intact with no focal deficits. Romberg sign cerebellar reflexes not assessed.  Psychiatric: Normal judgment and insight. Alert and oriented x 3. Normal mood and appropriate affect.   Data Reviewed: I have personally reviewed following labs and imaging studies  CBC:  Recent Labs Lab 06/23/16 1139 06/24/16 0559  WBC 7.1 8.5  NEUTROABS 5.5  --   HGB 8.0* 10.2*  HCT 24.4* 31.7*  MCV 81.1 78.5  PLT 188 147   Basic Metabolic Panel:  Recent Labs Lab 06/23/16 1139 06/24/16 0559  NA 141 143  K 4.5 3.8  CL 109 109  CO2 22 24  GLUCOSE 194* 153*  BUN 27* 20    CREATININE 1.50* 1.35*  CALCIUM 8.9 8.3*   GFR: Estimated Creatinine Clearance: 34 mL/min (A) (by C-G formula based on SCr of 1.35 mg/dL (H)). Liver Function Tests:  Recent Labs Lab 06/23/16 1139  AST 21  ALT 16*  ALKPHOS 59  BILITOT 0.8  PROT 6.5  ALBUMIN 4.2   No results for input(s): LIPASE, AMYLASE in the last 168 hours. No results for input(s): AMMONIA in the last 168 hours. Coagulation Profile:  Recent Labs Lab 06/23/16 1139 06/24/16 0559  INR 1.11 1.08   Cardiac Enzymes:  Recent Labs Lab 06/23/16 1240 06/23/16 1813 06/24/16 0559  TROPONINI 0.24* 1.08* 8.21*   BNP (last 3 results) No results for input(s): PROBNP in the last 8760 hours. HbA1C: No results for input(s): HGBA1C in the last 72 hours. CBG:  Recent Labs Lab 06/23/16 1627 06/23/16 2027 06/24/16 0752 06/24/16 1154  GLUCAP 235* 296* 156* 131*   Lipid Profile: No results for input(s): CHOL, HDL, LDLCALC, TRIG, CHOLHDL, LDLDIRECT in the last 72 hours. Thyroid Function Tests: No results for input(s): TSH, T4TOTAL, FREET4, T3FREE, THYROIDAB in the last 72 hours. Anemia Panel: No results for input(s): VITAMINB12, FOLATE, FERRITIN, TIBC, IRON, RETICCTPCT in the last 72 hours. Sepsis Labs: No results for input(s): PROCALCITON, LATICACIDVEN in the last 168 hours.  No results found for this or any previous visit (from the past 240 hour(s)).   Radiology Studies: Dg Chest Port 1 View  Result Date: 06/23/2016 CLINICAL DATA:  83 y/o  M; chest pain. EXAM: PORTABLE CHEST 1 VIEW COMPARISON:  01/17/2014 chest radiograph FINDINGS: Stable mildly enlarged cardiac silhouette. Post median sternotomy. 2 lead pacemaker. Aortic atherosclerosis with calcification. Patchy opacities in the left mid and lower lung zone may represent pulmonary edema or developing pneumonia. No pleural effusion or pneumothorax. Bones are unremarkable. IMPRESSION: 1. Patchy opacities in left mid and lower lung zone may represent  asymmetric edema or developing pneumonia. 2. Aortic atherosclerosis and mild cardiomegaly. Electronically Signed   By: Kristine Garbe M.D.   On: 06/23/2016 15:29   Scheduled Meds: . amiodarone  100 mg Oral Daily  . atorvastatin  10 mg Oral Daily  . carvedilol  12.5 mg Oral BID WC  . finasteride  5 mg Oral Daily  . furosemide  80 mg Oral Daily  . insulin aspart  0-9 Units Subcutaneous TID WC  . levothyroxine  112 mcg Oral QAC breakfast  . nitroGLYCERIN  0.5 inch Topical Q6H  . pantoprazole (PROTONIX) IV  40 mg Intravenous Q12H  . sodium  chloride flush  3 mL Intravenous Q12H   Continuous Infusions: . sodium chloride    . sodium chloride      LOS: 1 day   Kerney Elbe, DO [facility] Pager [phone]  If 7PM-7AM, please contact night-coverage [email or web address] Password [email or web address] 06/24/2016, 1:45 PM

## 2016-06-24 NOTE — Progress Notes (Signed)
CRITICAL VALUE ALERT  Critical Value:  Troponin 8.21  Date & Time Notied:  06/24/2016 1779  Provider Notified: Dr Alfredia Ferguson  Orders Received/Actions taken: Cardiology consult

## 2016-06-24 NOTE — Progress Notes (Signed)
Discuss case with cardiology and endoscopy team who recommends we hold endoscopy today and see how he does and we will allow clear liquids and will see later today

## 2016-06-24 NOTE — Progress Notes (Signed)
Evan [name] 2:13 PM  Subjective: Patient seen and examined and discussed with cardiology and his daughter and is had a little bright red blood per rectum today but no chest pain and no other abdominal complaints  Objective: Vital signs stable afebrile no acute distress abdomen is soft nontender hemoglobin increased nicely with transfusion BUN and creatinine okay  Assessment: Probable diverticular bleed  Plan: Will hold off on endoscopy for now and proceed when necessary signs of upper bleeding or if we have a need to know about ulcers before starting aspirin and we'll continue observation and probably resume Plavix soon and if no signs of further bleeding may advance diet tomorrow  [facility]  Pager [phone] After 5PM or if no answer call [phone]

## 2016-06-24 NOTE — Progress Notes (Signed)
     Troponin increased to 8. Rhythm on ECG is paced  Discussed with Dr. Watt Climes of gastroenterology. Recommend holding off of endoscopy at this time given his high risk from a cardiac perspective.  Unfortunately given his recent drop in hemoglobin and GI bleed, I cannot utilize traditional medication such as IV heparin, aspirin or Plavix. We will revisit this over the next few days. Continue with statin, beta blocker as tolerated.  History troponin elevation certainly could be secondary to a supply demand mismatch/demand ischemia in the setting of his anemia and overall stressors on his body in the setting of his recent bleeding episode.   He does have underlying coronary artery disease, prior CABG back in 1996 and his current myocardial infarction may be related to his prior placed drug-eluting stent, SVG to the obtuse marginal. According to Dr. Kyla Balzarine last office note note 5 out of 5 bypass grafts were patent.   Candee Furbish, MD

## 2016-06-24 NOTE — Progress Notes (Addendum)
Progress Note  Patient Name: Evan [name] Date of Encounter: 06/24/2016  Primary Cardiologist: Nishan/Allred  Subjective   Evan [name] is a 83 y.o. male with a hx of Coronary artery disease post CABG with stent placement in 2015, pacemaker, paroxysmal atrial fibrillation not on anticoagulation, aortic regurgitation mild, diabetes with hypertension who is being seen today for the evaluation of elevated troponin and chest pressure at the request of [place] Pink.  He received 2 units of blood with IV lasix yesterday and since has not had any chest pain. No pain through the night or this morning. He reports feeling significantly better. Unfortunately he has a significant elevated troponin at 8 this morning. Hemoglobin better at 10.2 this morning.   Inpatient Medications    Scheduled Meds: . amiodarone  100 mg Oral Daily  . atorvastatin  10 mg Oral Daily  . carvedilol  12.5 mg Oral BID WC  . finasteride  5 mg Oral Daily  . furosemide  80 mg Oral Daily  . insulin aspart  0-9 Units Subcutaneous TID WC  . levothyroxine  112 mcg Oral QAC breakfast  . nitroGLYCERIN  0.5 inch Topical Q6H  . pantoprazole (PROTONIX) IV  40 mg Intravenous Q12H  . sodium chloride flush  3 mL Intravenous Q12H   Continuous Infusions: . sodium chloride    . sodium chloride     PRN Meds: acetaminophen **OR** acetaminophen, morphine injection, ondansetron **OR** ondansetron (ZOFRAN) IV   Vital Signs    Vitals:   06/23/16 2305 06/24/16 0204 06/24/16 0448 06/24/16 0913  BP: (!) 132/43 (!) 167/50 (!) 159/52 (!) 156/48  Pulse: 60 (!) 59 60 61  Resp: 18 16 16    Temp: 98.5 F (36.9 C) 98.6 F (37 C) 98.1 F (36.7 C)   TempSrc: Oral Oral Oral   SpO2: 97% 94% 100%   Weight:      Height:        Intake/Output Summary (Last 24 hours) at 06/24/16 0954 Last data filed at 06/24/16 0204  Gross per 24 hour  Intake          1988.17 ml  Output              200 ml  Net          1788.17 ml   Filed Weights    06/23/16 1125 06/23/16 1419  Weight: 156 lb (70.8 kg) 163 lb 5.8 oz (74.1 kg)    Telemetry    HR 60s, paced rhythm, occasional PVC - Personally Reviewed   Physical Exam   GEN: Well nourished, well developed, pale appearing. Hard of hearing HEENT: normal  Neck: no JVD, carotid bruits, or masses Cardiac: RRR. no murmurs, rubs, or gallops,no edema. Intact distal pulses bilaterally.  Respiratory: clear to auscultation bilaterally, normal work of breathing GI: soft, nontender, nondistended, + BS MS: no deformity or atrophy  Skin: warm and dry, no rash Neuro: Alert and Oriented x 3, Strength and sensation are intact Psych:   Full affect  Labs    Chemistry Recent Labs Lab 06/23/16 1139 06/24/16 0559  NA 141 143  K 4.5 3.8  CL 109 109  CO2 22 24  GLUCOSE 194* 153*  BUN 27* 20  CREATININE 1.50* 1.35*  CALCIUM 8.9 8.3*  PROT 6.5  --   ALBUMIN 4.2  --   AST 21  --   ALT 16*  --   ALKPHOS 59  --   BILITOT 0.8  --   GFRNONAA 39* 45*  GFRAA 45* 52*  ANIONGAP 10 10     Hematology Recent Labs Lab 06/23/16 1139 06/24/16 0559  WBC 7.1 8.5  RBC 3.01* 4.04*  HGB 8.0* 10.2*  HCT 24.4* 31.7*  MCV 81.1 78.5  MCH 26.6 25.2*  MCHC 32.8 32.2  RDW 15.9* 16.4*  PLT 188 184    Cardiac Enzymes Recent Labs Lab 06/23/16 1240 06/23/16 1813 06/24/16 0559  TROPONINI 0.24* 1.08* 8.21*   No results for input(s): TROPIPOC in the last 168 hours.   BNPNo results for input(s): BNP, PROBNP in the last 168 hours.   DDimer No results for input(s): DDIMER in the last 168 hours.   Radiology    Dg Chest Port 1 View  Result Date: 06/23/2016 CLINICAL DATA:  83 y/o  M; chest pain. EXAM: PORTABLE CHEST 1 VIEW COMPARISON:  01/17/2014 chest radiograph FINDINGS: Stable mildly enlarged cardiac silhouette. Post median sternotomy. 2 lead pacemaker. Aortic atherosclerosis with calcification. Patchy opacities in the left mid and lower lung zone may represent pulmonary edema or developing  pneumonia. No pleural effusion or pneumothorax. Bones are unremarkable. IMPRESSION: 1. Patchy opacities in left mid and lower lung zone may represent asymmetric edema or developing pneumonia. 2. Aortic atherosclerosis and mild cardiomegaly. Electronically Signed   By: Kristine Garbe M.D.   On: 06/23/2016 15:29    Cardiac Studies    ECHO 2015  - Left ventricle: The cavity size was normal. Wall thickness was normal. Systolic function was mildly reduced. The estimated ejection fraction was in the range of 45% to 50%. Diffuse hypokinesis. Doppler parameters are consistent with abnormal left ventricular relaxation (grade 1 diastolic dysfunction). - Aortic valve: There was mild regurgitation. - Mitral valve: Calcified annulus. - Left atrium: The atrium was mildly dilated.   Patient Profile     Evan [name] is a 83 y.o. male with a hx of Coronary artery disease post CABG with stent placement in 2015, pacemaker, paroxysmal atrial fibrillation not on anticoagulation, aortic regurgitation mild, diabetes with hypertension who is being seen today for the evaluation of elevated troponin and chest pressure at the request of [place] Pink.  Assessment & Plan   Chest pain with elevated Troponin (NSTEMI):   1.08 << 8.21 Patient has had a drop in hemoglobin and GI bleed, received transfusion, hemoglobin is now stable.    He cannot have endoscopy at this time due to high cardiac risk.  He cannot receive traditional medications such as IV heparin, aspirin or Plavix (give his current bleeding) and therefore we will need to manage him   medically at this time. The patient has remained chest pain free since last night. The plan  is to continue statin and beta blocker as tolerated. Will re-visit in a few days.  Troponin could be due to demand ischemia due to anemia requiring transfusions but he does have hx of CAD, prior CABG x 5  in 1996 . He also has hx of DES, SVG to the obtuse marginal.     Dual-chamber pacemaker-- pacing.  Atrial Tachycardia: on amiodarone for prevention. On 100 mg a day.  Lower GI bleed: See above, patient is high risk for endoscopy/colonoscopy at this time.   Aortic regurgitation: Mild, not thought to be of clinical significance.   Signed, Linus Mako, PA-C  06/24/2016, 9:54 AM    Personally seen and examined. Agree with above.  Mild BRBPR today. Only one stool. Feels better, no CP, no SOB. Daughter, [name] in room.   GEN: Well nourished, well developed, in  no acute distress Elderly HEENT: normal  Neck: no JVD, carotid bruits, or masses Cardiac: RRR; no murmurs, rubs, or gallops,no edema  Respiratory:  clear to auscultation bilaterally, normal work of breathing GI: soft, nontender, nondistended, + BS MS: no deformity or atrophy  Skin: warm and dry, no rash Neuro:  Alert and Oriented x 3, Strength and sensation are intact Psych: euthymic mood, full affect  MI  - likely type 2 MI in the setting of anemia, blood loss, demand situation  - Unable to treat with traditional meds (no Heparin, asa, plavix)  - Has Circ stent 2015.   - Now CP free  - Med mgt. No invasive workup (more harm than benefit)  - Discussed with he and daughter  - Cont Statin, Coreg  GIB  - hold on endoscopy because of high risk (MI)  - if urgent, increased bleeding, may need to proceed.  - Discussed with Dr. Watt Climes  - May wish to wait for several days/ weeks before restart ASA  With elevated troponin, he is at increased CV risk over the next few weeks.  Candee Furbish, MD

## 2016-06-25 LAB — COMPREHENSIVE METABOLIC PANEL
ALBUMIN: 3.6 g/dL (ref 3.5–5.0)
ALK PHOS: 54 U/L (ref 38–126)
ALT: 16 U/L — ABNORMAL LOW (ref 17–63)
ANION GAP: 9 (ref 5–15)
AST: 27 U/L (ref 15–41)
BILIRUBIN TOTAL: 0.7 mg/dL (ref 0.3–1.2)
BUN: 17 mg/dL (ref 6–20)
CALCIUM: 8.3 mg/dL — AB (ref 8.9–10.3)
CO2: 26 mmol/L (ref 22–32)
Chloride: 106 mmol/L (ref 101–111)
Creatinine, Ser: 1.52 mg/dL — ABNORMAL HIGH (ref 0.61–1.24)
GFR calc Af Amer: 45 mL/min — ABNORMAL LOW (ref >60)
GFR, EST NON AFRICAN AMERICAN: 39 mL/min — AB (ref >60)
GLUCOSE: 159 mg/dL — AB (ref 65–99)
Potassium: 3.5 mmol/L (ref 3.5–5.1)
Sodium: 141 mmol/L (ref 135–145)
TOTAL PROTEIN: 6 g/dL — AB (ref 6.5–8.1)

## 2016-06-25 LAB — TROPONIN I: Troponin I: 3.3 ng/mL (ref <0.03)

## 2016-06-25 LAB — CBC
HEMATOCRIT: 29.7 % — AB (ref 39.0–52.0)
Hemoglobin: 9.6 g/dL — ABNORMAL LOW (ref 13.0–17.0)
MCH: 26 pg (ref 26.0–34.0)
MCHC: 32.3 g/dL (ref 30.0–36.0)
MCV: 80.5 fL (ref 78.0–100.0)
Platelets: 166 10*3/uL (ref 150–400)
RBC: 3.69 MIL/uL — ABNORMAL LOW (ref 4.22–5.81)
RDW: 16.7 % — AB (ref 11.5–15.5)
WBC: 5.5 10*3/uL (ref 4.0–10.5)

## 2016-06-25 LAB — GLUCOSE, CAPILLARY
GLUCOSE-CAPILLARY: 142 mg/dL — AB (ref 65–99)
Glucose-Capillary: 104 mg/dL — ABNORMAL HIGH (ref 65–99)
Glucose-Capillary: 162 mg/dL — ABNORMAL HIGH (ref 65–99)
Glucose-Capillary: 160 mg/dL — ABNORMAL HIGH (ref 65–99)

## 2016-06-25 LAB — MAGNESIUM: Magnesium: 2.2 mg/dL (ref 1.7–2.4)

## 2016-06-25 LAB — PHOSPHORUS: Phosphorus: 2.8 mg/dL (ref 2.5–4.6)

## 2016-06-25 NOTE — Progress Notes (Signed)
Progress Note  Patient Name: Evan [name] Date of Encounter: 06/25/2016  Primary Cardiologist: Nishan/Allred  Subjective   Evan J Markhamis a 83 y.o.malewith a hx of Coronary artery disease post CABG with stent placement in 2015, pacemaker, paroxysmal atrial fibrillation not on anticoagulation, aortic regurgitation mild, diabetes with hypertensionwho is being seen today for the evaluation of elevated troponin and chest pressureat the request of [place] Pink.  Patient feeling well this morning. He endorses still feeling weak. No chest pain or shortness of breath, still having a small amount of rectal bleeding. Vital signs well controlled this AM.  Inpatient Medications    Scheduled Meds: . amiodarone  100 mg Oral Daily  . atorvastatin  10 mg Oral Daily  . carvedilol  12.5 mg Oral BID WC  . finasteride  5 mg Oral Daily  . furosemide  80 mg Oral Daily  . insulin aspart  0-9 Units Subcutaneous TID WC  . levothyroxine  112 mcg Oral QAC breakfast  . nitroGLYCERIN  0.5 inch Topical Q6H  . pantoprazole  40 mg Oral Daily  . sodium chloride flush  3 mL Intravenous Q12H   Continuous Infusions: . sodium chloride    . sodium chloride     PRN Meds: acetaminophen **OR** acetaminophen, morphine injection, ondansetron **OR** ondansetron (ZOFRAN) IV   Vital Signs    Vitals:   06/24/16 0913 06/24/16 1413 06/24/16 2031 06/25/16 0420  BP: (!) 156/48 (!) 136/37 (!) 118/40 115/63  Pulse: 61 60 60 67  Resp:  18 20 18   Temp:  98.1 F (36.7 C) 98.3 F (36.8 C) 98 F (36.7 C)  TempSrc:  Oral Oral Oral  SpO2:  99% 97% 94%  Weight:      Height:        Intake/Output Summary (Last 24 hours) at 06/25/16 0808 Last data filed at 06/25/16 0421  Gross per 24 hour  Intake              210 ml  Output                0 ml  Net              210 ml   Filed Weights   06/23/16 1125 06/23/16 1419  Weight: 156 lb (70.8 kg) 163 lb 5.8 oz (74.1 kg)    Telemetry    HR 60s, occasional  PVCs, SR - Personally Reviewed   Physical Exam   GEN: Well nourished, well developed, pale HEENT: normal  Neck: no JVD, carotid bruits, or masses Cardiac: RRR. + systolic murmur. No rubs, or gallops,no edema. Intact distal pulses bilaterally.  Respiratory: clear to auscultation bilaterally, normal work of breathing GI: soft, nontender, nondistended, + BS MS: no deformity or atrophy  Skin: warm and dry, no rash Neuro: Alert and Oriented x 3, Strength and sensation are intact Psych:   Full affect  Labs    Chemistry Recent Labs Lab 06/23/16 1139 06/24/16 0559  NA 141 143  K 4.5 3.8  CL 109 109  CO2 22 24  GLUCOSE 194* 153*  BUN 27* 20  CREATININE 1.50* 1.35*  CALCIUM 8.9 8.3*  PROT 6.5  --   ALBUMIN 4.2  --   AST 21  --   ALT 16*  --   ALKPHOS 59  --   BILITOT 0.8  --   GFRNONAA 39* 45*  GFRAA 45* 52*  ANIONGAP 10 10     Hematology Recent Labs Lab 06/23/16 1139 06/24/16 0559  WBC 7.1 8.5  RBC 3.01* 4.04*  HGB 8.0* 10.2*  HCT 24.4* 31.7*  MCV 81.1 78.5  MCH 26.6 25.2*  MCHC 32.8 32.2  RDW 15.9* 16.4*  PLT 188 184    Cardiac Enzymes Recent Labs Lab 06/23/16 1240 06/23/16 1813 06/24/16 0559  TROPONINI 0.24* 1.08* 8.21*   No results for input(s): TROPIPOC in the last 168 hours.   BNPNo results for input(s): BNP, PROBNP in the last 168 hours.   DDimer No results for input(s): DDIMER in the last 168 hours.   Radiology    Dg Chest Port 1 View  Result Date: 06/23/2016 CLINICAL DATA:  83 y/o  M; chest pain. EXAM: PORTABLE CHEST 1 VIEW COMPARISON:  01/17/2014 chest radiograph FINDINGS: Stable mildly enlarged cardiac silhouette. Post median sternotomy. 2 lead pacemaker. Aortic atherosclerosis with calcification. Patchy opacities in the left mid and lower lung zone may represent pulmonary edema or developing pneumonia. No pleural effusion or pneumothorax. Bones are unremarkable. IMPRESSION: 1. Patchy opacities in left mid and lower lung zone may  represent asymmetric edema or developing pneumonia. 2. Aortic atherosclerosis and mild cardiomegaly. Electronically Signed   By: Kristine Garbe M.D.   On: 06/23/2016 15:29    Cardiac Studies   ECHO 2015  - Left ventricle: The cavity size was normal. Wall thickness was normal. Systolic function was mildly reduced. The estimated ejection fraction was in the range of 45% to 50%. Diffuse hypokinesis. Doppler parameters are consistent with abnormal left ventricular relaxation (grade 1 diastolic dysfunction). - Aortic valve: There was mild regurgitation. - Mitral valve: Calcified annulus. - Left atrium: The atrium was mildly dilated.  Patient Profile     Evan [name] a 83 y.o.malewith a hx of Coronary artery disease post CABG with stent placement in 2015, pacemaker, paroxysmal atrial fibrillation not on anticoagulation, aortic regurgitation mild, diabetes with hypertensionwho is being seen today for the evaluation of elevated troponin and chest pressureat the request of [place] Pink.  Assessment & Plan    MI Patient had Troponin elevation to 8 in the setting of anemia, GI bleed blood loss. This is likely related to demand as the patient had not been having problems with chest pain prior to this incident nor has he had symptoms since. He did endorse a significant amount of exertion the day he had the chest pain. At this time because of his active bleeding he cannot receive Heparin, ASA or Plavix at this time.  -- He has not had any further CP continue plan for no invasive work-up. Does have hx of stent 2015 to circ -- continue statin and coreg -- Mr. Mofield is at increased cardiovascular risk for next few weeks due to his elevated troponin.  GIB -- Patient is high risk because of elevated troponin (NSTEMI) and therefore endopscopy is not recommended at this time unless it becomes urgent/emergent. Dr. Marlou Porch has discussed this directly with Dr. Watt Climes.  -- May wish to  wait several days/weeksbefore restarting aspirin.      Signed, Linus Mako, PA-C  06/25/2016, 8:08 AM    Personally seen and examined. Agree with above.  MI Type 2  - Troponin now down to 3.3  - Continue with med mgt  - No angina  - When able, would like to restart perhaps Plavix only. Consider restart in 2-4 weeks, once he no longer demonstrates any bleeding.   Alert, RRR, CTAB, no edema  No further recs at this time Will sign off.  Will set up cardiology follow up  in 2-4 weeks with [name] or APP.   Please call if ?

## 2016-06-25 NOTE — Progress Notes (Signed)
PROGRESS NOTE    Evan [name]  YOV:[id number] DOB: Mar 25, 1923 DOA: 06/23/2016 PCP: Deland Pretty, MD  Brief Narrative:  Evan [name] is a 83 y.o. male with a past medical history of coronary artery disease status post CABG and stent placement in 2015, history of pacemaker placement, history of paroxysmal atrial fibrillation but not on anticoagulation, hx of atrial tachycardia, history of aortic stenosis, history of diabetes, hypertension, and other comorbids who was in his usual state of health until Sunday morning when he had an urge to use the bathroom. However, before he could make to the bathroom blood started coming out of his rectum. He felt somewhat dizzy, lightheaded. He finally reach the bathroom and had a large episode of rectal bleeding. Throughout the day. He had smaller episodes of rectal bleeding, however, did appear to be subsiding. By Monday morning, it had subsided. He did not seek any attention. He denied any abdominal pain. He did have some chest pressure last night for which he had to take nitroglycerin. Also had chest pressure n the morning of admission, which has improved. He had similar episode about 9 months ago for which she only saw his primary care provider.  The morning of admission he again had the urge to go to the bathroom and passed dark blood. At that point in time, he decided to seek attention. In the emergency department, patient was found to have maroon stool on rectal examination. He was transfused 2 units of pRBC's and Cardiology and Gastroenterology were consulted. Because patient's Troponin elevated to 8.21, patient was deemed a high risk for endoscopy and is currently being managed medically. His Chest Pain is now resolved and Troponin Trending down. Cardiology would like to possible restart Plavix in 2-4 weeks and recommends outpatient Cardiology Follow Up. Patient had some bleeding but states it has improved and Gastroenterology advanced diet to Soft Diet.    Assessment & Plan:   Principal Problem:   Rectal bleeding Active Problems:   Type 2 diabetes with nephropathy ([place])   Essential hypertension   CABG '96   CARDIAC MURMUR   CAD (coronary artery disease) of artery bypass graft   CAD S/P SVG-MO1/OM2 DES 12/11/13   Pacemaker implanted 12/12/13 (MDT)   Stage 3 chronic renal impairment associated with type 2 diabetes mellitus (HCC)   Acute blood loss anemia   NSTEMI (non-ST elevated myocardial infarction) ([place])  Acute suspected Lower GI Bleed/BRBPR/Rectal bleeding:  -Based on history, this appears to be lower GI bleed. -It appears that he last underwent colonoscopy in 2011 which showed diverticulosis. This seems to be the most plausible reason for his bleeding however patient was tacking Excedrin for back pain and was on ASA and Plavix -[facility] Consulted for further Recc's -Patient was to undergo an Endoscopy and was to undergo at 1 pm today but procedure was cancelled after Cardiology recommend holding colonoscopy at this time given his High Risk from a Cardiac Perspective as Troponin Level was 8.21 but trended down to 3.30 -C/w IV Pantoprazole 40 mg q12h -Per GI EGD on Hold and advanced diet from Clear to Soft now that Hb/Hct is relatively stable.  ABLA from Above -Hemoglobin is lower than his baseline.  -Since he was experiencing some anginal symptoms he was transfused 2 units of PRBC's  -Lasix was given between transfusions.  -Hb/Hct went from 8.0/24.4 -> 10.2/31.7 -> 9.6/29.7 -Continue to Monitor and repeat CBC in AM -Patient is still having some bright red blood per rectum but not as  much. -Will likely need to restart Plavix in 2-4 weeks when patient demonstrates no GIB  Type 2 NSTEMI in the setting of CAD and Anemia  -Checked EKG and Troponin. Patient is CP free now and EKG showed paced rhythm. -Troponin went from 0.24 -> 1.08 -> 8.21 -> 3.30 -This could be angina due to his anemia. He was transfused 2 units of  pRBC's -C/w Telemetry Monitoring -Was on aspirin and Plavix and had drug-eluting stent placed in 2015 and held these due to his GI bleed for now. -Cardiology Dr.Skains Consulted and per Cards cannot receive traditional medications such as IV heparin, aspirin or Plavix (give his current bleeding) and therefore we will need to manage him medically at this time. -C/w Atorvastatin 10 mg po Daily, and Carvedilol 12.5 mg po BID -C/w Morphine 1 mg IV q2hprn for Severe CP and with NTG 2% Ointment 1 inch topically q6h -Cardiology recommending continuing medical management and consider restarting Plavix in 2-4 weeks when he develops no bleeding. Cardiology signing off and setting up outpatient follow up with Dr. Pamelia Hoit as they have no further recc's at this time.   Atrial Tachycardia and Hx of PAF -Continue Amiodarone 100 mg po Daily for prevention.  -He is not on anticoagulation. -C/w Telemetry Monitoring   Essential Hypertension  -Blood pressure stable.  -C/w Carvedilol 12.5 mg po BID and with Furosemide 80 mg po Daily -Continue to monitor closely.  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 -Stable. C/w Telemetry Monitoring  Hypothyroidism -C/w Levothyroxine 112 mcg po Daily before Breakfast  BPH -C/w Finasteride 5 mg po Daily  Hx of Diastolic CHF -Currently not decompensated -C/w Carvedilol 12.5 mg po BID and Furosemide 80 mg po Daily -Cardiology Consulted and Following   Hyperlipidemia -C/w Atorvastatin 10 mg po Daily  DVT prophylaxis: SCDs Code Status: DO NOT RESUSCITATE Family Communication: No family present at bedside Disposition Plan: Will get PT Eval.  Likely D/C Home in AM  Consultants:   Gastroenterology Dr. Watt Climes  Cardiology Dr. Marlou Porch   Procedures: None   Antimicrobials: Anti-infectives    None     Subjective: Seen and examined and denied CP or SOB. States he felt ok and had a little bit of bleeding. No nausea or vomiting and doing well per patient.   Objective: Vitals:   06/24/16 1413 06/24/16 2031 06/25/16 0420 06/25/16 1132  BP: (!) 136/37 (!) 118/40 115/63 (!) 138/46  Pulse: 60 60 67 60  Resp: 18 20 18    Temp: 98.1 F (36.7 C) 98.3 F (36.8 C) 98 F (36.7 C)   TempSrc: Oral Oral Oral   SpO2: 99% 97% 94%   Weight:      Height:        Intake/Output Summary (Last 24 hours) at 06/25/16 1355 Last data filed at 06/25/16 0900  Gross per 24 hour  Intake              410 ml  Output                0 ml  Net              410 ml   Filed Weights   06/23/16 1125 06/23/16  1419  Weight: 70.8 kg (156 lb) 74.1 kg (163 lb 5.8 oz)   Examination: Physical Exam:  Constitutional: Extremely pleasant elderly Caucasian male in NAD and appears comfortable sitting at bedside in chair. Eyes: Sclerae anicteric; Lids normal ENMT: External Ears and nose appear normal. Patient is hard of hearing Neck: Supple with no JVD Respiratory: CTAB. No wheezing/rales/rhonchi Cardiovascular: RRR. Patient has a 2/6 Systolic Murmur Abdomen:  Soft, NT, ND. Bowel Sounds present GU: Deferred Musculoskeletal: No contractures; No cyanosis Skin: Warm and dry. No rashes or lesions on a limited skin eval Neurologic: CN 2-12 grossly intact. No appreciable focal deficits  Psychiatric: Pleasant mood and affect. Intact judgement and insight. A and O x3.    Data Reviewed: I have personally reviewed following labs and imaging studies  CBC:  Recent Labs Lab 06/23/16 1139 06/24/16 0559 06/25/16 1056  WBC 7.1 8.5 5.5  NEUTROABS 5.5  --   --   HGB 8.0* 10.2* 9.6*  HCT 24.4* 31.7* 29.7*  MCV 81.1 78.5 80.5  PLT 188 184 703   Basic Metabolic  Panel:  Recent Labs Lab 06/23/16 1139 06/24/16 0559 06/25/16 1056  NA 141 143 141  K 4.5 3.8 3.5  CL 109 109 106  CO2 22 24 26   GLUCOSE 194* 153* 159*  BUN 27* 20 17  CREATININE 1.50* 1.35* 1.52*  CALCIUM 8.9 8.3* 8.3*  MG  --   --  2.2  PHOS  --   --  2.8   GFR: Estimated Creatinine Clearance: 30.2 mL/min (A) (by C-G formula based on SCr of 1.52 mg/dL (H)). Liver Function Tests:  Recent Labs Lab 06/23/16 1139 06/25/16 1056  AST 21 27  ALT 16* 16*  ALKPHOS 59 54  BILITOT 0.8 0.7  PROT 6.5 6.0*  ALBUMIN 4.2 3.6   No results for input(s): LIPASE, AMYLASE in the last 168 hours. No results for input(s): AMMONIA in the last 168 hours. Coagulation Profile:  Recent Labs Lab 06/23/16 1139 06/24/16 0559  INR 1.11 1.08   Cardiac Enzymes:  Recent Labs Lab 06/23/16 1240 06/23/16 1813 06/24/16 0559 06/25/16 1056  TROPONINI 0.24* 1.08* 8.21* 3.30*   BNP (last 3 results) No results for input(s): PROBNP in the last 8760 hours. HbA1C: No results for input(s): HGBA1C in the last 72 hours. CBG:  Recent Labs Lab 06/24/16 1154 06/24/16 1625 06/24/16 2218 06/25/16 0748 06/25/16 1159  GLUCAP 131* 253* 95 104* 142*   Lipid Profile: No results for input(s): CHOL, HDL, LDLCALC, TRIG, CHOLHDL, LDLDIRECT in the last 72 hours. Thyroid Function Tests: No results for input(s): TSH, T4TOTAL, FREET4, T3FREE, THYROIDAB in the last 72 hours. Anemia Panel: No results for input(s): VITAMINB12, FOLATE, FERRITIN, TIBC, IRON, RETICCTPCT in the last 72 hours. Sepsis Labs: No results for input(s): PROCALCITON, LATICACIDVEN in the last 168 hours.  No results found for this or any previous visit (from the past 240 hour(s)).   Radiology Studies: Dg Chest Port 1 View  Result Date: 06/23/2016 CLINICAL DATA:  83 y/o  M; chest pain. EXAM: PORTABLE CHEST 1 VIEW COMPARISON:  01/17/2014 chest radiograph FINDINGS: Stable mildly enlarged cardiac silhouette. Post median sternotomy. 2 lead  pacemaker. Aortic atherosclerosis with calcification. Patchy opacities in the left mid and lower lung zone may represent pulmonary edema or developing pneumonia. No pleural effusion or pneumothorax. Bones are unremarkable. IMPRESSION: 1. Patchy opacities in left mid and lower lung zone may represent asymmetric edema or developing pneumonia. 2. Aortic atherosclerosis and mild cardiomegaly. Electronically Signed   By:  Kristine Garbe M.D.   On: 06/23/2016 15:29   Scheduled Meds: . amiodarone  100 mg Oral Daily  . atorvastatin  10 mg Oral Daily  . carvedilol  12.5 mg Oral BID WC  . finasteride  5 mg Oral Daily  . furosemide  80 mg Oral Daily  . insulin aspart  0-9 Units Subcutaneous TID WC  . levothyroxine  112 mcg Oral QAC breakfast  . nitroGLYCERIN  0.5 inch Topical Q6H  . pantoprazole  40 mg Oral Daily  . sodium chloride flush  3 mL Intravenous Q12H   Continuous Infusions: . sodium chloride    . sodium chloride      LOS: 2 days   Kerney Elbe, DO [facility] Pager [phone]  If 7PM-7AM, please contact night-coverage [email or web address] Password [email or web address] 06/25/2016, 1:55 PM

## 2016-06-25 NOTE — Progress Notes (Signed)
Evan [name] 10:48 AM  Subjective: The patient feels well and has not seen any blood today and had only minimal yesterday and has no new complaints and we discussed his sleep aids as well  Objective: Vital signs stable afebrile no acute distress abdomen is soft nontender hemoglobin pending  Assessment: GI bleeding in patient on aspirin and Plavix  Plan: As long as hemoglobin okay may have soft solids later today and will have to decide on when the restart blood thinners and if he truly needs both  [facility] E  Pager [phone] After 5PM or if no answer call [phone]

## 2016-06-25 NOTE — Care Management Note (Signed)
Case Management Note  Patient Details  Name: Evan [name] MRN: [id number] Date of Birth: [birth year]  Subjective/Objective:   Rectal bleeding                 Action/Plan: Pt from [facility]. Will discharge with [facility].    Expected Discharge Date:   (UNKNOWN)               Expected Discharge Plan:  [place]  In-House Referral:     Discharge planning Services  CM Consult  Post Acute Care Choice:    Choice offered to:  Patient  DME Arranged:    DME Agency:     HH Arranged:  RN, PT [place] Agency:  [facility] (now [facility])  Status of Service:  In process, will continue to follow  If discussed at Long Length of Stay Meetings, dates discussed:    Additional CommentsPurcell Mouton, RN 06/25/2016, 3:13 PM

## 2016-06-26 LAB — CBC WITH DIFFERENTIAL/PLATELET
BASOS PCT: 0 %
Basophils Absolute: 0 10*3/uL (ref 0.0–0.1)
EOS ABS: 0.1 10*3/uL (ref 0.0–0.7)
Eosinophils Relative: 3 %
HEMATOCRIT: 29.8 % — AB (ref 39.0–52.0)
Hemoglobin: 9.7 g/dL — ABNORMAL LOW (ref 13.0–17.0)
LYMPHS ABS: 0.8 10*3/uL (ref 0.7–4.0)
Lymphocytes Relative: 16 %
MCH: 26.3 pg (ref 26.0–34.0)
MCHC: 32.6 g/dL (ref 30.0–36.0)
MCV: 80.8 fL (ref 78.0–100.0)
MONO ABS: 0.5 10*3/uL (ref 0.1–1.0)
MONOS PCT: 11 %
Neutro Abs: 3.5 10*3/uL (ref 1.7–7.7)
Neutrophils Relative %: 70 %
Platelets: 185 10*3/uL (ref 150–400)
RBC: 3.69 MIL/uL — ABNORMAL LOW (ref 4.22–5.81)
RDW: 16.7 % — AB (ref 11.5–15.5)
WBC: 4.9 10*3/uL (ref 4.0–10.5)

## 2016-06-26 LAB — COMPREHENSIVE METABOLIC PANEL
ALBUMIN: 3.5 g/dL (ref 3.5–5.0)
ALK PHOS: 54 U/L (ref 38–126)
ALT: 15 U/L — ABNORMAL LOW (ref 17–63)
ANION GAP: 9 (ref 5–15)
AST: 21 U/L (ref 15–41)
BILIRUBIN TOTAL: 0.9 mg/dL (ref 0.3–1.2)
BUN: 19 mg/dL (ref 6–20)
CALCIUM: 8.3 mg/dL — AB (ref 8.9–10.3)
CO2: 27 mmol/L (ref 22–32)
Chloride: 106 mmol/L (ref 101–111)
Creatinine, Ser: 1.42 mg/dL — ABNORMAL HIGH (ref 0.61–1.24)
GFR calc non Af Amer: 42 mL/min — ABNORMAL LOW (ref >60)
GFR, EST AFRICAN AMERICAN: 49 mL/min — AB (ref >60)
GLUCOSE: 147 mg/dL — AB (ref 65–99)
POTASSIUM: 3.5 mmol/L (ref 3.5–5.1)
SODIUM: 142 mmol/L (ref 135–145)
TOTAL PROTEIN: 5.9 g/dL — AB (ref 6.5–8.1)

## 2016-06-26 LAB — GLUCOSE, CAPILLARY
GLUCOSE-CAPILLARY: 135 mg/dL — AB (ref 65–99)
Glucose-Capillary: 194 mg/dL — ABNORMAL HIGH (ref 65–99)

## 2016-06-26 LAB — MAGNESIUM: Magnesium: 2.4 mg/dL (ref 1.7–2.4)

## 2016-06-26 LAB — PHOSPHORUS: PHOSPHORUS: 3.7 mg/dL (ref 2.5–4.6)

## 2016-06-26 MED ORDER — CLOPIDOGREL BISULFATE 75 MG PO TABS: 75.0000 mg | ORAL_TABLET | Freq: Every day | ORAL | 0 refills | Status: DC

## 2016-06-26 MED ORDER — PANTOPRAZOLE SODIUM 40 MG PO TBEC: 40.0000 mg | DELAYED_RELEASE_TABLET | Freq: Every day | ORAL | 0 refills | Status: DC

## 2016-06-26 NOTE — Progress Notes (Signed)
Pt discharged with all belongings. No s/sx of distress upon discharge. Reviewed DC orders and medications with patient using teachback. Pt verbalizes understanding. [facility]. [facility] and have RX switched.

## 2016-06-26 NOTE — Progress Notes (Signed)
Evan [name] 11:24 AM  Subjective: Patient with only 1 small dark stool and no new complaints and case discussed with the hospital team and probably going home today  Objective: Vital signs stable afebrile no acute distress abdomen is soft nontender hemoglobin stable  Assessment: Probable diverticular bleeding in patient on aspirin and Plavix  Plan: Okay with me to go home and resume Plavix on Monday but hold aspirin until seen by cardiology and happy to see back when necessary particularly if endoscopy is requested to evaluate any aspirin at risk lesions of the upper tract and please let us know if we can help  [facility] E  Pager [phone] After 5PM or if no answer call [phone]

## 2016-06-26 NOTE — Evaluation (Signed)
Occupational Therapy Evaluation Patient Details Name: Evan [name] MRN: [id number] DOB: 08/23/1923 Today's Date: 06/26/2016    History of Present Illness 83 y.o. male with a past medical history of coronary artery disease status post CABG and stent placement in 2015, pacemaker placement, paroxysmal atrial fibrillation but not on anticoagulation, atrial tachycardia, aortic stenosis, diabetes, hypertension, and vertigo admitted for rectal bleeding   Clinical Impression   PATIENT WAS SEEN FOR SKILLED OT EVALUATION. PATIENT WAS ABLE TO PERFORM ADLS AT MOD I LEVEL. PATIENT PERFORMED TRANSFER TO COMMODE AND WALK IN SHOWER AT MOD I WITH GOOD SAFETY. PATIENT IS AN AGREEMENT THAT HE IS AT BASELINE PERFORMANCE. PATIENT DOES NOT REQUIRE FURTHER OT AT THIS TIME.     Follow Up Recommendations  No OT follow up    Equipment Recommendations  None recommended by OT    Recommendations for Other Services       Precautions / Restrictions Precautions Precautions: Fall      Mobility Bed Mobility               General bed mobility comments: pt up in recliner on arrival  Transfers Overall transfer level: Needs assistance Equipment used: Rolling walker (2 wheeled) Transfers: Sit to/from [employer] Transfers Sit to Stand: Modified independent (Device/Increase time) Stand pivot transfers: Modified independent (Device/Increase time)       General transfer comment: performs transfer safely    Balance Overall balance assessment:  (reports last fall was 6 months ago)                                         ADL either performed or assessed with clinical judgement   ADL Overall ADL's : At baseline                                       General ADL Comments: PATIENT IS MOD I WITH ADLS AND ADL TRANSFERS.      Vision Baseline Vision/History: Wears glasses;Macular Degeneration Wears Glasses: At all times Patient Visual Report: No change from  baseline       Perception     Praxis      Pertinent Vitals/Pain Pain Assessment: No/denies pain     Hand Dominance Right   Extremity/Trunk Assessment Upper Extremity Assessment Upper Extremity Assessment: Overall WFL for tasks assessed   Lower Extremity Assessment Lower Extremity Assessment: Generalized weakness       Communication Communication Communication: No difficulties   Cognition Arousal/Alertness: Awake/alert Behavior During Therapy: WFL for tasks assessed/performed Overall Cognitive Status: Within Functional Limits for tasks assessed                                     General Comments       Exercises     Shoulder Instructions      Home Living Family/patient expects to be discharged to:: Private residence Living Arrangements: Alone   Type of Home: Independent living facility             Bathroom Shower/Tub: Walk-in shower   Bathroom Toilet: Handicapped height     Home Equipment: Environmental consultant - 4 wheels;Shower seat - built in;Grab bars - toilet;Grab bars - tub/shower;Hand held shower head  Prior Functioning/Environment Level of Independence: Independent with assistive device(s)        Comments: pt doesn't use assistive device around apartment but does use walker for ambulating in facility, ambulates to dining hall        OT Problem List:        OT Treatment/Interventions:      OT Goals(Current goals can be found in the care plan section) Acute Rehab OT Goals Patient Stated Goal: GO HOME  OT Frequency:     Barriers to D/C:            Co-evaluation              AM-PAC PT "6 Clicks" Daily Activity     Outcome Measure Help from another person eating meals?: None Help from another person taking care of personal grooming?: None Help from another person toileting, which includes using toliet, bedpan, or urinal?: None Help from another person bathing (including washing, rinsing, drying)?: None Help from  another person to put on and taking off regular upper body clothing?: None Help from another person to put on and taking off regular lower body clothing?: None 6 Click Score: 24   End of Session Equipment Utilized During Treatment: Gait belt;Rolling walker  Activity Tolerance: Patient tolerated treatment well Patient left: in chair;with call bell/phone within reach;with chair alarm set                   Time: 1140-1208 OT Time Calculation (min): 28 min Charges:  OT General Charges $OT Visit: 1 Procedure OT Evaluation $OT Eval Low Complexity: 1 Procedure G-Codes:     6 CLICKS  Marvetta Vohs 06/26/2016, 12:09 PM

## 2016-06-26 NOTE — Discharge Summary (Signed)
Physician Discharge Summary  Evan [name] HQI:[id number] DOB: 1923-06-11 DOA: 06/23/2016  PCP: Deland Pretty, MD  Admit date: 06/23/2016 Discharge date: 06/26/2016  Admitted From: Home Disposition:  Home with [place] PT and RN  Recommendations for Outpatient Follow-up:  1. Follow up with PCP in 1-2 weeks 2. Follow up with Cardiology [name] in 1-2 weeks 3. Follow up with Gastroenterology Dr. Watt Climes in 1-2 weeks 4. If [name] would like to start patient back on ASA have [name] contact Dr. Watt Climes in Gastroenterology to evaluate for EGD to see if its safe to resume ASA and discuss case  5. Start Plavix Monday 06/29/16 per GI Recommendations 6. Please obtain CMP/CBC, Mag, Phos in one week  Home Health: Yes Equipment/Devices: None recommended by PT    Discharge Condition: Stable CODE STATUS: FULL CODE  Diet recommendation: Heart Healthy Carb Modified Diet  Brief/Interim Summary: Evan J Markhamis a 83 y.o.malewith a past medical history of coronary artery disease status post CABG and stent placement in 2015, history of pacemaker placement, history of paroxysmal atrial fibrillation but not on anticoagulation, hx of atrial tachycardia, history of aortic stenosis, history of diabetes, hypertension, and other comorbids who was in his usual state of health until Sunday morning when he had an urge to use the bathroom. However, before he could make to the bathroom blood started coming out of his rectum. He felt somewhat dizzy, lightheaded. He finally reach the bathroom and had a large episode of rectal bleeding. Throughout the day. He had smaller episodes of rectal bleeding, however, did appear to be subsiding. By Monday morning, it had subsided. He did not seek any attention. He denied any abdominal pain. He did have some chest pressure last night for which he had to take nitroglycerin. Also had chest pressure on the morning of admission, which has improved. He had similar episode  about 9 months ago for which she only saw his primary care provider.  The morning of admission he again hadthe urge to go to the bathroom and passed dark blood. At that point in time, he decided to seek attention. In the emergency department, patient was found to have maroon stool on rectal examination. He was transfused 2 units of pRBC's and Cardiology and Gastroenterology were consulted. Patient was found to have an NSTEMI and Because patient's Troponin elevated to 8.21, patient was deemed a high risk for endoscopy and was being managed medically. His Chest Pain is now resolved and Troponin Trending down. Cardiology would like to possible restart Plavix in 2-4 weeks and recommends outpatient Cardiology Follow Up but GI recommended that it is ok to Restart Plavix on Monday 06/29/16. Patient had some bleeding but states it has improved and Gastroenterology advanced diet to Soft Diet and patient had no further drop in Hb/Hct as it is now stable. Patient was advised to stay off ASA for now. If patient needs to be on ASA, [name] will have to evaluate the patient to medically clear the patient for an EGD to be done by Dr. Watt Climes as an outpatient. Patient will need to follow up with PCP, Cardiology, and Gastroenterology as he has been deemed medically stable.   Discharge Diagnoses:  Principal Problem:   Rectal bleeding Active Problems:   Type 2 diabetes with nephropathy [facility])   Essential hypertension   CABG '96   CARDIAC MURMUR   CAD (coronary artery disease) of artery bypass graft   CAD S/P SVG-MO1/OM2 DES 12/11/13   Pacemaker implanted 12/12/13 (MDT)  Stage 3 chronic renal impairment associated with type 2 diabetes mellitus (HCC)   Acute blood loss anemia   NSTEMI (non-ST elevated myocardial infarction) ([place])  Acute suspected Lower GI Bleed/BRBPR/Rectal bleeding:  -Based on history, this appears to be lower GI bleed. -It appears that he last underwent colonoscopy in 2011 which showed  diverticulosis. This seems to be the most plausible reason for his bleeding however patient was taking Excedrin for back pain and was on ASA and Plavix -[facility] Consulted for further Recc's -Patient was to undergo an Endoscopy and was to undergo but procedure was cancelled after Cardiology recommend holding colonoscopy at this time given his High Risk from a Cardiac Perspective as Troponin Level was 8.21 -Tropoinin now Trended down to 3.30 -Changed IV Pantoprazole 40 mg q12h back to home po Dose -Per GI EGD on Hold and advanced diet from Clear to Soft now that Hb/Hct is relatively stable. -IF needs to be on ASA will need an outpatient EGD -Follow up with Gastroenterology as an outpatient in 1-2 weeks.   ABLA from Above -Hemoglobin is lower than his baseline.  -Since he was experiencing some anginal symptoms he was transfused 2 units of PRBC's  -Lasix was given between transfusions.  -Hb/Hct went from 8.0/24.4 -> 10.2/31.7 -> 9.6/29.7 -> 9.7/29.8 -Continue to Monitor and repeat CBC as an outpatient  -Patient is still having some bright red blood per rectum but not as much. -Per Cardiology Will likely need to restart Plavix in 2-4 weeks when patient demonstrates no GIB however per GI ok to resume Plavix on Monday 06/29/16 after my conversation with Dr. Watt Climes -Follow up with Gastroenterology if bleed worsens  Type 2 NSTEMI in the setting of CAD and Anemia  -Checked EKG and Troponin. Patient is CP free now and EKG showed paced rhythm. -Troponin went from 0.24 -> 1.08 -> 8.21 -> 3.30 -This could be angina due to his anemia. He was transfused 2 units of pRBC's -HadTelemetry Monitoring -Was on aspirin and Plavix and had drug-eluting stent placed in 2015 and held these due to his GI bleedfor now. Per GI ok to resume Plavix on Monday 06/29/16 -Cardiology Dr.Skains Consulted and per Cards cannot receive traditional medications such as IV heparin, aspirin or Plavix (give his current  bleeding) and therefore we will need to manage him medically at this time. -C/w Atorvastatin 10 mg po Daily, and Carvedilol 12.5 mg po BID -C/w Morphine 1 mg IV q2hprn for Severe CP and with NTG 2% Ointment 1 inch topically q6h -Cardiology recommending continuing medical management and consider restarting Plavix in 2-4 weeks when he develops no bleeding. Cardiology signing off and setting up outpatient follow up with Dr. Pamelia Hoit as they have no further recc's at this time.  -Per GI ok to Restart Plavix on Monday 06/29/16 -If patient needs to be on ASA [name] will need to contact Dr. Watt Climes to see about outpatient EGD as it was never done in the hospital as patient was deemed a High Cardiac Risk -Follow up with Cardiology [name].   Atrial Tachycardia and Hx of PAF -Continue Amiodarone 100 mg po Daily for prevention.  -He is not on anticoagulation. -Telemetry Monitoring while hospitalized  Essential Hypertension  -Blood pressure stable.  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 -Stable. C/w Telemetry Monitoring -Follow up with [name] as an outpatient   Hypothyroidism -C/w Levothyroxine 112 mcg po Daily before Breakfast  BPH -C/w Finasteride 5 mg po Daily  Hx of Diastolic CHF -Currently not decompensated -C/w Carvedilol 12.5 mg po BID and Furosemide 80 mg po Daily -Cardiology Consulted and Following -Follow up with [name]  as an outpatient    Hyperlipidemia -C/w Atorvastatin 10 mg po Daily  Discharge Instructions  Discharge Instructions    (HEART FAILURE PATIENTS) Call MD:  Anytime you have any of the following symptoms: 1) 3 pound weight gain in 24 hours or 5 pounds in 1 week 2) shortness of breath, with or without a dry hacking cough 3) swelling in the hands, feet or stomach 4) if you have to sleep on extra pillows at night in order to breathe.    Complete by:  As directed    Call MD for:  difficulty breathing, headache or visual disturbances    Complete by:  As directed    Call MD for:  extreme fatigue    Complete by:  As directed    Call MD for:  hives    Complete by:  As directed    Call MD for:  persistant dizziness or light-headedness    Complete by:  As directed    Call MD for:  persistant nausea and vomiting    Complete by:  As directed    Call MD for:  redness, tenderness, or signs of infection (pain, swelling, redness, odor or green/yellow discharge around incision site)    Complete by:  As directed    Call MD for:  severe uncontrolled pain    Complete by:  As directed    Call MD for:  temperature >100.4    Complete by:  As directed    Diet - low sodium heart healthy    Complete by:  As directed    Discharge instructions    Complete by:  As directed    Follow up with PCP, Cardiology, and Gastroenterology as an outpatient. Take all medications as prescribed and restart Plavix on Monday per GI Dr. Perley Jain recommendation. If symptoms change or worsen please return to the ED for evaluation.   Increase activity slowly    Complete by:  As directed      Allergies as of 06/26/2016      Reactions   Clonidine Derivatives    unknown reaction   Codeine    unknown reaction   Meperidine Hcl    REACTION: Hypotension   Motrin [ibuprofen]    unknown reaction   Other    Celery - unknown reaction   Penicillins    unknown reaction   Demerol Other (See Comments)   hypotension   Heparin Other  (See Comments)   hypotension   Metformin And Related Diarrhea      Medication List    STOP taking these medications   aspirin 81 MG chewable tablet     TAKE these medications   amiodarone 200 MG tablet Commonly known as:  PACERONE Take 0.5 tablets (100 mg total) by mouth daily.   amLODipine 5 MG tablet Commonly known as:  NORVASC Take 1 tablet (5 mg total) by mouth daily.   atorvastatin 10 MG tablet Commonly known as:  LIPITOR Take 1 tablet (10 mg total) by mouth daily.   carvedilol 12.5 MG tablet Commonly known as:  COREG TAKE 1 TABLET BY MOUTH TWICE DAILY WITH A MEAL   clopidogrel 75 MG tablet Commonly known as:  PLAVIX Take 1 tablet (75 mg total) by mouth daily with breakfast. Start taking on:  06/29/2016 What changed:  These instructions start on 06/29/2016. If you are unsure what to do until then, ask your doctor or other care provider.   finasteride 5 MG tablet Commonly known as:  PROSCAR Take 5 mg by mouth daily.   furosemide 80 MG tablet Commonly known as:  LASIX Take 80 mg by mouth daily.   isosorbide mononitrate 30 MG 24 hr tablet Commonly known as:  IMDUR Take 1 tablet (30 mg total) by mouth daily.   JANUVIA 50 MG tablet Generic drug:  sitaGLIPtin Take 50 mg by mouth daily.   levothyroxine 112 MCG tablet Commonly known as:  SYNTHROID, LEVOTHROID Take 112 mcg by mouth daily before breakfast.   nitroGLYCERIN 0.4 MG SL tablet Commonly known as:  NITROSTAT PLACE 1 TABLET (0.4 MG TOTAL) UNDER THE TONGUE EVERY 5 (FIVE) MINUTES AS NEEDED FOR CHEST PAIN.   pantoprazole 40 MG tablet Commonly known as:  PROTONIX Take 1 tablet (40 mg total) by mouth daily. Start taking on:  06/27/2016      Follow-up Information    Eileen Stanford, PA-C. Go on 07/10/2016.   Specialties:  Cardiology, Radiology Why:  Your appointment time is at 11am. Please arrive 10-15 minutes early for your follow-up appointment Contact information: [address] [place] [address] [phone]          Allergies  Allergen Reactions  . Clonidine Derivatives     unknown reaction   . Codeine     unknown reaction   . Meperidine Hcl     REACTION: Hypotension  . Motrin [Ibuprofen]     unknown reaction   . Other     Celery - unknown reaction  . Penicillins     unknown reaction   . Demerol Other (See Comments)    hypotension  . Heparin Other (See Comments)    hypotension  . Metformin And Related Diarrhea   Consultations:  Gastroenterology Dr. Clarene Essex  Cardiology Dr. Candee Furbish  Procedures/Studies: Dg Chest Port 1 View  Result Date: 06/23/2016 CLINICAL DATA:  83 y/o  M; chest pain. EXAM: PORTABLE CHEST 1 VIEW COMPARISON:  01/17/2014 chest radiograph FINDINGS: Stable mildly enlarged cardiac silhouette. Post median sternotomy. 2 lead pacemaker. Aortic atherosclerosis with calcification. Patchy opacities in the left mid and lower lung zone may represent pulmonary edema or developing pneumonia. No pleural effusion or pneumothorax. Bones are unremarkable. IMPRESSION: 1. Patchy opacities in left mid and lower lung zone may represent asymmetric edema or developing pneumonia. 2. Aortic atherosclerosis and mild cardiomegaly. Electronically Signed   By: Kristine Garbe M.D.   On: 06/23/2016 15:29     Subjective: Seen and examined and was doing well. Stated he had very slight blood from rectum. No nausea or vomiting. Ready to go home.  Discharge Exam: Vitals:   06/25/16 2209 06/26/16 0515  BP: (!) 100/54 101/60  Pulse: 65 67  Resp: 18 18  Temp: 97.4 F (36.3 C) 97.5 F (36.4 C)   Vitals:   06/25/16 1436 06/25/16 1815 06/25/16 2209 06/26/16  0515  BP: (!) 103/42 (!) 129/39 (!) 100/54 101/60  Pulse: (!) 59 63 65 67  Resp: 16  18 18   Temp: 97.9 F (36.6 C)  97.4 F (36.3 C) 97.5 F (36.4 C)  TempSrc: Oral  Oral Oral  SpO2: 100%  100% 100%  Weight:      Height:       General: Pt is alert, awake, not in  acute distress Cardiovascular: RRR, S1/S2 +, no rubs, no gallops Respiratory: Diminished bilaterally, no wheezing, no rhonchi Abdominal: Soft, NT, ND, bowel sounds + Extremities: no edema, no cyanosis  The results of significant diagnostics from this hospitalization (including imaging, microbiology, ancillary and laboratory) are listed below for reference.    Microbiology: No results found for this or any previous visit (from the past 240 hour(s)).   Labs: BNP (last 3 results) No results for input(s): BNP in the last 8760 hours. Basic Metabolic Panel:  Recent Labs Lab 06/23/16 1139 06/24/16 0559 06/25/16 1056 06/26/16 0505  NA 141 143 141 142  K 4.5 3.8 3.5 3.5  CL 109 109 106 106  CO2 22 24 26 27   GLUCOSE 194* 153* 159* 147*  BUN 27* 20 17 19   CREATININE 1.50* 1.35* 1.52* 1.42*  CALCIUM 8.9 8.3* 8.3* 8.3*  MG  --   --  2.2 2.4  PHOS  --   --  2.8 3.7   Liver Function Tests:  Recent Labs Lab 06/23/16 1139 06/25/16 1056 06/26/16 0505  AST 21 27 21   ALT 16* 16* 15*  ALKPHOS 59 54 54  BILITOT 0.8 0.7 0.9  PROT 6.5 6.0* 5.9*  ALBUMIN 4.2 3.6 3.5   No results for input(s): LIPASE, AMYLASE in the last 168 hours. No results for input(s): AMMONIA in the last 168 hours. CBC:  Recent Labs Lab 06/23/16 1139 06/24/16 0559 06/25/16 1056 06/26/16 0505  WBC 7.1 8.5 5.5 4.9  NEUTROABS 5.5  --   --  3.5  HGB 8.0* 10.2* 9.6* 9.7*  HCT 24.4* 31.7* 29.7* 29.8*  MCV 81.1 78.5 80.5 80.8  PLT 188 184 166 185   Cardiac Enzymes:  Recent Labs Lab 06/23/16 1240 06/23/16 1813 06/24/16 0559 06/25/16 1056  TROPONINI 0.24* 1.08* 8.21* 3.30*   BNP: Invalid input(s): POCBNP CBG:  Recent Labs Lab 06/25/16 1159 06/25/16 1621 06/25/16 2204 06/26/16 0733 06/26/16 1216  GLUCAP 142* 162* 160* 135* 194*   D-Dimer No results for input(s): DDIMER in the last 72 hours. Hgb A1c No results for input(s): HGBA1C in the last 72 hours. Lipid Profile No results for input(s):  CHOL, HDL, LDLCALC, TRIG, CHOLHDL, LDLDIRECT in the last 72 hours. Thyroid function studies No results for input(s): TSH, T4TOTAL, T3FREE, THYROIDAB in the last 72 hours.  Invalid input(s): FREET3 Anemia work up No results for input(s): VITAMINB12, FOLATE, FERRITIN, TIBC, IRON, RETICCTPCT in the last 72 hours. Urinalysis    Component Value Date/Time   COLORURINE YELLOW 12/07/2013 1737   APPEARANCEUR CLEAR 12/07/2013 1737   LABSPEC 1.023 12/07/2013 1737   PHURINE 5.0 12/07/2013 1737   GLUCOSEU NEGATIVE 12/07/2013 1737   HGBUR NEGATIVE 12/07/2013 1737   BILIRUBINUR NEGATIVE 12/07/2013 1737   KETONESUR NEGATIVE 12/07/2013 1737   PROTEINUR 30 (A) 12/07/2013 1737   UROBILINOGEN 0.2 12/07/2013 1737   NITRITE NEGATIVE 12/07/2013 1737   LEUKOCYTESUR TRACE (A) 12/07/2013 1737   Sepsis Labs Invalid input(s): PROCALCITONIN,  WBC,  LACTICIDVEN Microbiology No results found for this or any previous visit (from the past 240 hour(s)).  Time  coordinating discharge: 35 minutes  SIGNED:  Kerney Elbe, DO [facility] 06/26/2016, 5:37 PM Pager [phone]  If 7PM-7AM, please contact night-coverage [email or web address] Password [email or web address]

## 2016-06-26 NOTE — Evaluation (Signed)
Physical Therapy One Time Evaluation Patient Details Name: Evan [name] MRN: [id number] DOB: Oct 17, 1923 Today's Date: 06/26/2016   History of Present Illness  83 y.o. male with a past medical history of coronary artery disease status post CABG and stent placement in 2015, pacemaker placement, paroxysmal atrial fibrillation but not on anticoagulation, atrial tachycardia, aortic stenosis, diabetes, hypertension, and vertigo admitted for rectal bleeding  Clinical Impression  Patient evaluated by Physical Therapy with no further acute PT needs identified. All education has been completed and the patient has no further questions. Pt mobilizing well with RW and denies any symptoms during mobility.  Pt states he always uses walker when ambulating to dining hall.  Recommend resuming HHPT upon d/c. PT is signing off. Thank you for this referral.     Follow Up Recommendations Home health PT (resume HHPT - was using [facility])    Equipment Recommendations  None recommended by PT    Recommendations for Other Services       Precautions / Restrictions Precautions Precautions: Fall      Mobility  Bed Mobility               General bed mobility comments: pt up in recliner on arrival  Transfers Overall transfer level: Needs assistance Equipment used: Rolling walker (2 wheeled) Transfers: Sit to/from Stand Sit to Stand: Supervision;Min guard         General transfer comment: performs transfer safely  Ambulation/Gait Ambulation/Gait assistance: Min guard;Supervision Ambulation Distance (Feet): 220 Feet Assistive device: Rolling walker (2 wheeled) Gait Pattern/deviations: Step-through pattern;Decreased stride length;Trunk flexed     General Gait Details: steady with RW, cues for posture, pt not used to using RW - typically uses rollator, denies any symptoms  Stairs            Wheelchair Mobility    Modified Rankin (Stroke Patients Only)       Balance Overall  balance assessment:  (reports last fall was 6 months ago)                                           Pertinent Vitals/Pain Pain Assessment: No/denies pain    Home Living Family/patient expects to be discharged to:: Private residence Living Arrangements: Alone   Type of Home: Independent living facility         Home Equipment: Walker - 4 wheels      Prior Function Level of Independence: Independent with assistive device(s)         Comments: pt doesn't use assistive device around apartment but does use walker for ambulating in facility, ambulates to dining hall     Hand Dominance        Extremity/Trunk Assessment        Lower Extremity Assessment Lower Extremity Assessment: Generalized weakness       Communication   Communication: HOH  Cognition Arousal/Alertness: Awake/alert Behavior During Therapy: WFL for tasks assessed/performed Overall Cognitive Status: Within Functional Limits for tasks assessed                                        General Comments      Exercises     Assessment/Plan    PT Assessment All further PT needs can be met in the next venue of care  PT Problem  List Decreased strength;Decreased mobility;Decreased balance;Decreased activity tolerance       PT Treatment Interventions      PT Goals (Current goals can be found in the Care Plan section)  Acute Rehab PT Goals PT Goal Formulation: All assessment and education complete, DC therapy    Frequency     Barriers to discharge        Co-evaluation               AM-PAC PT "6 Clicks" Daily Activity  Outcome Measure Difficulty turning over in bed (including adjusting bedclothes, sheets and blankets)?: None Difficulty moving from lying on back to sitting on the side of the bed? : None Difficulty sitting down on and standing up from a chair with arms (e.g., wheelchair, bedside commode, etc,.)?: None Help needed moving to and from a bed to  chair (including a wheelchair)?: A Little Help needed walking in hospital room?: A Little Help needed climbing 3-5 steps with a railing? : A Little 6 Click Score: 21    End of Session Equipment Utilized During Treatment: Gait belt Activity Tolerance: Patient tolerated treatment well Patient left: in chair;with call bell/phone within reach;with chair alarm set Nurse Communication: Mobility status PT Visit Diagnosis: Difficulty in walking, not elsewhere classified (R26.2);Muscle weakness (generalized) (M62.81)    Time: 0102-7253 PT Time Calculation (min) (ACUTE ONLY): 11 min   Charges:   PT Evaluation $PT Eval Low Complexity: 1 Procedure     PT G Codes:       Carmelia Bake, PT, DPT 06/26/2016 Pager: [phone]   York Ram E 06/26/2016, 11:39 AM

## 2016-06-27 ENCOUNTER — Other Ambulatory Visit: Payer: Self-pay | Admitting: Cardiovascular Disease

## 2016-06-27 DIAGNOSIS — I5043 Acute on chronic combined systolic (congestive) and diastolic (congestive) heart failure: Secondary | ICD-10-CM

## 2016-06-29 ENCOUNTER — Telehealth: Payer: Self-pay | Admitting: Cardiovascular Disease

## 2016-06-29 NOTE — Telephone Encounter (Signed)
Not on coumadin not sure why INR checked continue to hold plavix

## 2016-06-29 NOTE — Telephone Encounter (Signed)
Ena Dawley nurse with [facility] wanted to clarify with [name] that he was aware that patient did have a recent heart attack and GI bleed. Informed Ena Dawley that I am sure [name] reviewed the patient's chart, but would send him the message to make sure he is aware.

## 2016-06-29 NOTE — Telephone Encounter (Signed)
New message    Ena Dawley from [facility] is calling to report pt INR. She states his INR is 1.0 and PT is 12.4. She said since he has been in hospital with GI bleed, his plavix has been held for a week now and he will start retaking it today.

## 2016-06-29 NOTE — Telephone Encounter (Signed)
Pt not on warfarin prior to bleed or currently. Will forward to [name] and his nurse for follow up.

## 2016-07-10 ENCOUNTER — Ambulatory Visit: Payer: Medicare Other | Admitting: Physician Assistant

## 2016-07-13 ENCOUNTER — Ambulatory Visit (INDEPENDENT_AMBULATORY_CARE_PROVIDER_SITE_OTHER): Payer: Medicare Other | Admitting: *Deleted

## 2016-07-13 DIAGNOSIS — I442 Atrioventricular block, complete: Secondary | ICD-10-CM | POA: Diagnosis not present

## 2016-07-14 LAB — CUP PACEART REMOTE DEVICE CHECK
Battery Impedance: 184 Ohm
Battery Remaining Longevity: 110 mo
Battery Voltage: 2.78 V
Brady Statistic AP VP Percent: 100 %
Brady Statistic AP VS Percent: 0 %
Brady Statistic AS VP Percent: 0 %
Implantable Lead Implant Date: 20151208
Implantable Lead Location: 753859
Implantable Lead Model: 5076
Implantable Lead Model: 5092
Implantable Pulse Generator Implant Date: 20151208
Lead Channel Impedance Value: 423 Ohm
Lead Channel Impedance Value: 640 Ohm
Lead Channel Pacing Threshold Amplitude: 0.625 V
Lead Channel Pacing Threshold Amplitude: 0.875 V
Lead Channel Setting Pacing Amplitude: 2 V
Lead Channel Setting Pacing Amplitude: 2.5 V
Lead Channel Setting Pacing Pulse Width: 0.4 ms
MDC IDC LEAD IMPLANT DT: 20151208
MDC IDC LEAD LOCATION: 753860
MDC IDC MSMT LEADCHNL RA PACING THRESHOLD PULSEWIDTH: 0.4 ms
MDC IDC MSMT LEADCHNL RV PACING THRESHOLD PULSEWIDTH: 0.4 ms
MDC IDC SESS DTM: 20180709131816
MDC IDC SET LEADCHNL RV SENSING SENSITIVITY: 4 mV
MDC IDC STAT BRADY AS VS PERCENT: 0 %

## 2016-07-14 NOTE — Progress Notes (Signed)
Remote pacemaker transmission.   

## 2016-07-16 ENCOUNTER — Ambulatory Visit (INDEPENDENT_AMBULATORY_CARE_PROVIDER_SITE_OTHER): Payer: Medicare Other | Admitting: Physician Assistant

## 2016-07-16 ENCOUNTER — Encounter: Payer: Self-pay | Admitting: Physician Assistant

## 2016-07-16 VITALS — BP 148/60 | HR 61 | Ht 67.0 in | Wt 158.4 lb

## 2016-07-16 DIAGNOSIS — I2510 Atherosclerotic heart disease of native coronary artery without angina pectoris: Secondary | ICD-10-CM

## 2016-07-16 DIAGNOSIS — I5042 Chronic combined systolic (congestive) and diastolic (congestive) heart failure: Secondary | ICD-10-CM | POA: Diagnosis not present

## 2016-07-16 DIAGNOSIS — Z79899 Other long term (current) drug therapy: Secondary | ICD-10-CM | POA: Diagnosis not present

## 2016-07-16 DIAGNOSIS — Z8719 Personal history of other diseases of the digestive system: Secondary | ICD-10-CM | POA: Diagnosis not present

## 2016-07-16 DIAGNOSIS — N189 Chronic kidney disease, unspecified: Secondary | ICD-10-CM | POA: Diagnosis not present

## 2016-07-16 DIAGNOSIS — I2583 Coronary atherosclerosis due to lipid rich plaque: Secondary | ICD-10-CM | POA: Diagnosis not present

## 2016-07-16 DIAGNOSIS — I480 Paroxysmal atrial fibrillation: Secondary | ICD-10-CM | POA: Diagnosis not present

## 2016-07-16 DIAGNOSIS — Z950 Presence of cardiac pacemaker: Secondary | ICD-10-CM

## 2016-07-16 NOTE — Progress Notes (Signed)
Cardiology Office Note    Date:  07/16/2016   ID:  Evan [name], DOB Feb 05, 1923, MRN [id number]  PCP:  Evan Pretty, MD  Cardiologist:  [name] / Dr. Rayann Heman   CC: post hospital follow up  History of Present Illness:  Evan [name] is a 83 y.o. male with a history of CAD s/p CABG x5V (1996) s/p DES to SVG--> OM1/2 (2015), DMT2, LV dysfunction (EF 45-50% in 2015), PAD, hypothyroidism, CHB s/p PPM, PAF, and PAT on amiodarone who presents to clinic for post hospital follow up.   He was admitted in 2015 with syncope/bradycardia and chest pain. He was was cathed and had DES to the SVG to the OM - had 5/5 patent bypass grafts. Cath was followed by PPM implant per Dr. Rayann Heman - dual chamber device. Has chronic weakness in the legs/atrophy and has had frequent falls. He is on chronic amiodarone for PAF. He is not felt to be a good candidate for [place].   He was recently admitted 6/19-6/22/18. He presented with hematochezia and chest pain. Hg dropped from 12 to 8. He was transfused PRBCs. Troponin peaked at 8.21. He was not started on heparin and ASA/plavix were discontinued given active bleeding. He was not felt to be stable for EGD given significant rise in troponin. His troponin trended down and Hg remained stable. GI cleared him for plavix and he was discharged home.   Today he presents to clinic for follow up. He has been feeling a lot better since discharge. He has not had anymore blood in his stool. He has had some minor chest pain. No SOB. No LE edema, orthopnea or PND. No dizziness or syncope. He walks about 1/2 a mile a day with no issues. Feels better if he takes deep breaths in.   Past Medical History:  Diagnosis Date  . AC joint dislocation   . ALLERGIC RHINITIS   . Allergic rhinitis   . Anginal pain ([place])   . Arthritis   . Atrial tachycardia ([place])   . BPH (benign prostatic hyperplasia)   . CAD   . CHF (congestive heart failure) ([place])   . Diarrhea   . DM    type 2  .  GERD   . GERD (gastroesophageal reflux disease)   . H/O: GI bleed   . HYPERCHOLESTEROLEMIA   . HYPERTENSION   . Iron deficiency anemia   . Nephrolithiasis   . NEPHROLITHIASIS, HX OF   . Paroxysmal atrial fibrillation (HCC)   . Presence of permanent cardiac pacemaker 12/12/2013  . Rib fracture   . Stable angina (HCC)   . Syncope   . Thyroid disease   . TRANSIENT ISCHEMIC ATTACKS, HX OF   . 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 Urinary frequency   . Vertigo   . Weakness of left leg     Past Surgical History:  Procedure Laterality Date  . AV FISTULA REPAIR    . BACK SURGERY  2000'S  . CARDIAC CATHETERIZATION  12/11/2013   Procedure: CORONARY STENT INTERVENTION;  Surgeon: Burnell Blanks, MD;  Location: [facility] CATH LAB;  Service: Cardiovascular;;  DES 3.5 x 15 Resolute to seq SVG of OM1/OM2   . CORONARY ARTERY BYPASS GRAFT  1996  . CORONARY STENT PLACEMENT  12/11/13   SVG-OM DES  . FEMORAL ARTERY ANEURYSM REPAIR     feroral pseudo-aneurysm repair  . INSERT / REPLACE / REMOVE PACEMAKER    .  LEFT HEART CATHETERIZATION WITH CORONARY/GRAFT ANGIOGRAM N/A 12/11/2013   Procedure: LEFT HEART CATHETERIZATION WITH Beatrix Fetters;  Surgeon: Burnell Blanks, MD;  Location: [facility] CATH LAB;  Service: Cardiovascular;  Laterality: N/A;  . PERMANENT PACEMAKER INSERTION N/A 12/12/2013   MDT ADDRL1 pacemaker implnated by Dr Rayann Heman  . PTCA  1980's    Current Medications: Outpatient Medications Prior to Visit  Medication Sig Dispense Refill  . amiodarone (PACERONE) 200 MG tablet Take 0.5 tablets (100 mg total) by mouth daily. 90 tablet 1  . amLODipine (NORVASC) 5 MG tablet Take 1 tablet (5 mg total) by mouth daily. 90 tablet 1  . atorvastatin (LIPITOR) 10 MG tablet Take 1 tablet (10 mg total) by mouth daily. 30 tablet 3  . carvedilol (COREG) 12.5 MG tablet TAKE 1 TABLET BY MOUTH TWICE DAILY WITH A MEAL 180 tablet 0  . clopidogrel (PLAVIX) 75 MG tablet  Take 1 tablet (75 mg total) by mouth daily with breakfast. 90 tablet 0  . finasteride (PROSCAR) 5 MG tablet Take 5 mg by mouth daily.    . furosemide (LASIX) 80 MG tablet Take 80 mg by mouth daily.    . isosorbide mononitrate (IMDUR) 30 MG 24 hr tablet TAKE 1 TABLET BY MOUTH DAILY GENERIC EQUIVALENT FOR IMDUR 90 tablet 2  . JANUVIA 50 MG tablet Take 50 mg by mouth daily.  2  . levothyroxine (SYNTHROID, LEVOTHROID) 112 MCG tablet Take 112 mcg by mouth daily before breakfast.    . nitroGLYCERIN (NITROSTAT) 0.4 MG SL tablet PLACE 1 TABLET (0.4 MG TOTAL) UNDER THE TONGUE EVERY 5 (FIVE) MINUTES AS NEEDED FOR CHEST PAIN. 25 tablet 3  . pantoprazole (PROTONIX) 40 MG tablet Take 1 tablet (40 mg total) by mouth daily. 30 tablet 0   No facility-administered medications prior to visit.      Allergies:   Clonidine derivatives; Codeine; Meperidine hcl; Motrin [ibuprofen]; Other; Penicillins; Demerol; Heparin; and Metformin and related   Social History   Social History  . Marital status: Married    Spouse name: N/A  . Number of children: N/A  . Years of education: N/A   Social History Main Topics  . Smoking status: Former Smoker    Quit date: 01/06/1956  . Smokeless tobacco: Never Used  . Alcohol use No  . Drug use: No  . Sexual activity: Not Currently   Other Topics Concern  . None   Social History Narrative   Pt is married and lives with his wife.  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       Family History:  The patient's family history includes Coronary artery disease in his father; Diabetes in his mother.      ROS:   Please see the history of present illness.    ROS All other systems reviewed and are negative.   PHYSICAL EXAM:   VS:  BP (!) 148/60   Pulse 61   Ht 5\' 7"  (1.702 m)   Wt 158 lb 6.4 oz (71.8 kg)   SpO2 98%   BMI 24.81 kg/m    GEN: Well nourished, well developed, in no acute distress, elderly and frail  HEENT: normal   Neck: no JVD, carotid bruits, or masses Cardiac: RRR; + murmur, rubs, or gallops,no edema  Respiratory:  clear to auscultation bilaterally, normal work of breathing GI: soft, nontender, nondistended, + BS MS: no deformity or atrophy  Skin: warm and dry, no rash Neuro:  Alert and Oriented x 3, Strength and sensation are intact Psych: euthymic mood, full affect   Wt Readings from Last 3 Encounters:  07/16/16 158 lb 6.4 oz (71.8 kg)  06/23/16 163 lb 5.8 oz (74.1 kg)  05/06/16 159 lb 1.9 oz (72.2 kg)      Studies/Labs Reviewed:   EKG:  EKG is NOT ordered today.   Recent Labs: 06/26/2016: ALT 15; BUN 19; Creatinine, Ser 1.42; Hemoglobin 9.7; Magnesium 2.4; Platelets 185; Potassium 3.5; Sodium 142   Lipid Panel No results found for: CHOL, TRIG, HDL, CHOLHDL, VLDL, LDLCALC, LDLDIRECT  Additional studies/ records that were reviewed today include:  2D ECHO: 12/08/2013 LV EF: 45% -  50% Study Conclusions  Left ventricle: The cavity size was normal. Wall thickness was normal. Systolic function was mildly reduced. The estimated ejection fraction was in the range of 45% to 50%. Diffuse hypokinesis. Doppler parameters are consistent with abnormal left ventricular relaxation (grade 1 diastolic dysfunction). - Aortic valve: There was mild regurgitation. - Mitral valve: Calcified annulus. - Left atrium: The atrium was mildly dilated.   ASSESSMENT & PLAN:   CAD s/p CABG and PCI: with recent NSTEMI ( possibly type II) in the setting of acute anemia from hematochezia. GI cleared him for plavix. I do not think he needs to be on DAPT with ASA and plavix at this time. Continue plavix, statin and BB.   GIB: no more blood in stool. Will get a CBC today and make sure he gets follow up with Dr. Watt Climes  Chronic combined S/D CHF: continue lasix 80mg  BID. Appears euvolemic  CKD: creat 1.42 at discharge, which is around his baseline. Will check a BMET today  PAF/PAT: continue low dose  amio, followed by Dr. Rayann Heman. Not felt to be a good candidate for [place]  CHB s/p PPM: followed by Dr. Rayann Heman  Medication Adjustments/Labs and Tests Ordered: Current medicines are reviewed at length with the patient today.  Concerns regarding medicines are outlined above.  Medication changes, Labs and Tests ordered today are listed in the Patient Instructions below. Patient Instructions  Medication Instructions:  Your physician recommends that you continue on your current medications as directed. Please refer to the Current Medication list given to you today.   Labwork: TODAY:  BMET, CBC  Testing/Procedures: None ordered  Follow-Up: Your physician recommends that you schedule a follow-up appointment in: 3 MONTHS WITH [name]  Any Other Special Instructions Will Be Listed Below (If Applicable). Dr. Perley Jain office will contact you with an appointment.  If you need a refill on your cardiac medications before your next appointment, please call your pharmacy.      Signed, Angelena Form, PA-C  07/16/2016 2:52 PM    [facility] [address], [place], [place]  [address] Phone: [phone]; Fax: [phone]

## 2016-07-16 NOTE — Patient Instructions (Addendum)
Medication Instructions:  Your physician recommends that you continue on your current medications as directed. Please refer to the Current Medication list given to you today.   Labwork: TODAY:  BMET  Testing/Procedures: None ordered  Follow-Up: Your physician recommends that you schedule a follow-up appointment in: 3 MONTHS WITH [name]  Any Other Special Instructions Will Be Listed Below (If Applicable). Dr. Perley Jain office will contact you with an appointment.  If you need a refill on your cardiac medications before your next appointment, please call your pharmacy.

## 2016-07-17 LAB — BASIC METABOLIC PANEL
BUN / CREAT RATIO: 11 (ref 10–24)
BUN: 16 mg/dL (ref 10–36)
CALCIUM: 8.6 mg/dL (ref 8.6–10.2)
CO2: 23 mmol/L (ref 20–29)
Chloride: 103 mmol/L (ref 96–106)
Creatinine, Ser: 1.46 mg/dL — ABNORMAL HIGH (ref 0.76–1.27)
GFR, EST AFRICAN AMERICAN: 48 mL/min/{1.73_m2} — AB (ref >59)
GFR, EST NON AFRICAN AMERICAN: 42 mL/min/{1.73_m2} — AB (ref >59)
Glucose: 136 mg/dL — ABNORMAL HIGH (ref 65–99)
Potassium: 3.8 mmol/L (ref 3.5–5.2)
SODIUM: 141 mmol/L (ref 134–144)

## 2016-07-17 LAB — CBC
HEMOGLOBIN: 9.3 g/dL — AB (ref 13.0–17.7)
Hematocrit: 30 % — ABNORMAL LOW (ref 37.5–51.0)
MCH: 24.6 pg — AB (ref 26.6–33.0)
MCHC: 31 g/dL — AB (ref 31.5–35.7)
MCV: 79 fL (ref 79–97)
Platelets: 245 10*3/uL (ref 150–379)
RBC: 3.78 x10E6/uL — ABNORMAL LOW (ref 4.14–5.80)
RDW: 17 % — AB (ref 12.3–15.4)
WBC: 4.1 10*3/uL (ref 3.4–10.8)

## 2016-07-20 ENCOUNTER — Encounter: Payer: Self-pay | Admitting: Cardiology

## 2016-07-23 ENCOUNTER — Telehealth: Payer: Self-pay | Admitting: Cardiovascular Disease

## 2016-07-23 NOTE — Telephone Encounter (Signed)
Spoke with pt and made him aware that Bonney Leitz, PA-C said labs are stable.  Pt appreciative for call.

## 2016-07-23 NOTE — Telephone Encounter (Signed)
Follow Up:    Returning Heather call from yesterday,concerning his lab results.

## 2016-07-28 ENCOUNTER — Encounter: Payer: Self-pay | Admitting: *Deleted

## 2016-07-28 ENCOUNTER — Telehealth: Payer: Self-pay | Admitting: Cardiovascular Disease

## 2016-07-28 NOTE — Telephone Encounter (Signed)
New message   Evan [name] from [facility] is stating that the pt has gained 3lbs since the 16th. He is now at 157. Pt is also having some sickness and diarrhea.

## 2016-07-28 NOTE — Telephone Encounter (Signed)
Wilfred Lacy the nurse with home health calling about a 3 lbs weight gain over a week with some BLE edema. Patient's vital signs BP 140/60, HR 60, and Resp 20. She stated patient is complaining of sickness to stomach and diarrhea. Wilfred Lacy stated she wanted to let [name] know of patient's condition. Informed Brett with home health to contact PCP about stomach issues and diarrhea. Informed Wilfred Lacy that our office advises patients to call if they have a weight gain of over 5 lbs in one week or 3 lbs in 24 hour period. Wilfred Lacy stated that usually patient's weight is check in the morning and this weight was this afternoon, so it might be inaccurate weight gain. Encouraged Wilfred Lacy to contact PCP for now, that [name] is out of the office this week. Wilfred Lacy verbalized understanding and will call PCP for advisement.

## 2016-07-30 ENCOUNTER — Emergency Department (HOSPITAL_COMMUNITY): Payer: Medicare Other

## 2016-07-30 ENCOUNTER — Encounter (HOSPITAL_COMMUNITY): Payer: Self-pay | Admitting: Emergency Medicine

## 2016-07-30 ENCOUNTER — Inpatient Hospital Stay (HOSPITAL_COMMUNITY)
Admission: EM | Admit: 2016-07-30 | Discharge: 2016-08-05 | DRG: 291 | Disposition: A | Discharge status: Skilled Nursing Facility | Payer: Medicare Other | Attending: Family Medicine | Admitting: Family Medicine

## 2016-07-30 ENCOUNTER — Observation Stay (HOSPITAL_BASED_OUTPATIENT_CLINIC_OR_DEPARTMENT_OTHER): Payer: Medicare Other

## 2016-07-30 DIAGNOSIS — Z951 Presence of aortocoronary bypass graft: Secondary | ICD-10-CM

## 2016-07-30 DIAGNOSIS — I361 Nonrheumatic tricuspid (valve) insufficiency: Secondary | ICD-10-CM | POA: Diagnosis not present

## 2016-07-30 DIAGNOSIS — R0600 Dyspnea, unspecified: Secondary | ICD-10-CM | POA: Diagnosis not present

## 2016-07-30 DIAGNOSIS — I255 Ischemic cardiomyopathy: Secondary | ICD-10-CM | POA: Diagnosis present

## 2016-07-30 DIAGNOSIS — I130 Hypertensive heart and chronic kidney disease with heart failure and stage 1 through stage 4 chronic kidney disease, or unspecified chronic kidney disease: Secondary | ICD-10-CM | POA: Diagnosis not present

## 2016-07-30 DIAGNOSIS — I509 Heart failure, unspecified: Secondary | ICD-10-CM

## 2016-07-30 DIAGNOSIS — K219 Gastro-esophageal reflux disease without esophagitis: Secondary | ICD-10-CM | POA: Diagnosis not present

## 2016-07-30 DIAGNOSIS — G4700 Insomnia, unspecified: Secondary | ICD-10-CM | POA: Diagnosis present

## 2016-07-30 DIAGNOSIS — Z955 Presence of coronary angioplasty implant and graft: Secondary | ICD-10-CM

## 2016-07-30 DIAGNOSIS — I340 Nonrheumatic mitral (valve) insufficiency: Secondary | ICD-10-CM | POA: Diagnosis not present

## 2016-07-30 DIAGNOSIS — Z87891 Personal history of nicotine dependence: Secondary | ICD-10-CM

## 2016-07-30 DIAGNOSIS — I2510 Atherosclerotic heart disease of native coronary artery without angina pectoris: Secondary | ICD-10-CM | POA: Diagnosis present

## 2016-07-30 DIAGNOSIS — Z79899 Other long term (current) drug therapy: Secondary | ICD-10-CM

## 2016-07-30 DIAGNOSIS — E7800 Pure hypercholesterolemia, unspecified: Secondary | ICD-10-CM | POA: Diagnosis present

## 2016-07-30 DIAGNOSIS — E1122 Type 2 diabetes mellitus with diabetic chronic kidney disease: Secondary | ICD-10-CM | POA: Diagnosis present

## 2016-07-30 DIAGNOSIS — L8990 Pressure ulcer of unspecified site, unspecified stage: Secondary | ICD-10-CM | POA: Insufficient documentation

## 2016-07-30 DIAGNOSIS — D509 Iron deficiency anemia, unspecified: Secondary | ICD-10-CM | POA: Diagnosis present

## 2016-07-30 DIAGNOSIS — I5043 Acute on chronic combined systolic (congestive) and diastolic (congestive) heart failure: Secondary | ICD-10-CM | POA: Diagnosis not present

## 2016-07-30 DIAGNOSIS — Z950 Presence of cardiac pacemaker: Secondary | ICD-10-CM

## 2016-07-30 DIAGNOSIS — I480 Paroxysmal atrial fibrillation: Secondary | ICD-10-CM | POA: Diagnosis not present

## 2016-07-30 DIAGNOSIS — N183 Chronic kidney disease, stage 3 unspecified: Secondary | ICD-10-CM | POA: Diagnosis present

## 2016-07-30 DIAGNOSIS — Z7902 Long term (current) use of antithrombotics/antiplatelets: Secondary | ICD-10-CM

## 2016-07-30 DIAGNOSIS — R0789 Other chest pain: Secondary | ICD-10-CM

## 2016-07-30 DIAGNOSIS — Z8673 Personal history of transient ischemic attack (TIA), and cerebral infarction without residual deficits: Secondary | ICD-10-CM

## 2016-07-30 DIAGNOSIS — J9601 Acute respiratory failure with hypoxia: Secondary | ICD-10-CM

## 2016-07-30 DIAGNOSIS — I10 Essential (primary) hypertension: Secondary | ICD-10-CM | POA: Diagnosis present

## 2016-07-30 DIAGNOSIS — R197 Diarrhea, unspecified: Secondary | ICD-10-CM

## 2016-07-30 DIAGNOSIS — I442 Atrioventricular block, complete: Secondary | ICD-10-CM | POA: Diagnosis not present

## 2016-07-30 DIAGNOSIS — E039 Hypothyroidism, unspecified: Secondary | ICD-10-CM | POA: Diagnosis present

## 2016-07-30 DIAGNOSIS — E1151 Type 2 diabetes mellitus with diabetic peripheral angiopathy without gangrene: Secondary | ICD-10-CM | POA: Diagnosis present

## 2016-07-30 DIAGNOSIS — I5023 Acute on chronic systolic (congestive) heart failure: Secondary | ICD-10-CM

## 2016-07-30 DIAGNOSIS — N400 Enlarged prostate without lower urinary tract symptoms: Secondary | ICD-10-CM | POA: Diagnosis present

## 2016-07-30 DIAGNOSIS — E876 Hypokalemia: Secondary | ICD-10-CM | POA: Diagnosis present

## 2016-07-30 DIAGNOSIS — Z66 Do not resuscitate: Secondary | ICD-10-CM | POA: Diagnosis present

## 2016-07-30 DIAGNOSIS — I350 Nonrheumatic aortic (valve) stenosis: Secondary | ICD-10-CM | POA: Diagnosis present

## 2016-07-30 LAB — FOLATE: FOLATE: 11.7 ng/mL (ref >5.9)

## 2016-07-30 LAB — C DIFFICILE QUICK SCREEN W PCR REFLEX
C DIFFICLE (CDIFF) ANTIGEN: POSITIVE — AB
C Diff toxin: NEGATIVE

## 2016-07-30 LAB — BRAIN NATRIURETIC PEPTIDE: B NATRIURETIC PEPTIDE 5: 1276.9 pg/mL — AB (ref 0.0–100.0)

## 2016-07-30 LAB — ECHOCARDIOGRAM COMPLETE
AO mean calculated velocity dopler: 139 cm/s
AOVTI: 48.9 cm
AV Mean grad: 9 mmHg
AVPG: 17 mmHg
AVPHT: 369 ms
AVPKVEL: 207 cm/s
Ao-asc: 26 cm
CHL CUP MV DEC (S): 173
CHL CUP RV SYS PRESS: 63 mmHg
EWDT: 173 ms
FS: 29 % (ref 28–44)
HEIGHTINCHES: 67 in
IV/PV OW: 1.42
LA diam index: 2.45 cm/m2
LA vol: 74.5 mL
LASIZE: 45 mm
LAVOLA4C: 74.2 mL
LAVOLIN: 40.6 mL/m2
LDCA: 2.01 cm2
LEFT ATRIUM END SYS DIAM: 45 mm
LV PW d: 8.52 mm — AB (ref 0.6–1.1)
LVOT diameter: 16 mm
MV M vel: 67.8
MV Peak grad: 10 mmHg
MV pk A vel: 55.1 m/s
MVANNULUSVTI: 42.2 cm
MVPKEVEL: 156 m/s
Mean grad: 2 mmHg
RV TAPSE: 16.3 mm
Reg peak vel: 346 cm/s
TR max vel: 346 cm/s
VTI: 195 cm
Weight: 2486.4 oz

## 2016-07-30 LAB — COMPREHENSIVE METABOLIC PANEL
ALBUMIN: 3.5 g/dL (ref 3.5–5.0)
ALK PHOS: 65 U/L (ref 38–126)
ALT: 13 U/L — AB (ref 17–63)
AST: 16 U/L (ref 15–41)
Anion gap: 13 (ref 5–15)
BUN: 23 mg/dL — AB (ref 6–20)
CALCIUM: 8.6 mg/dL — AB (ref 8.9–10.3)
CO2: 21 mmol/L — ABNORMAL LOW (ref 22–32)
Chloride: 103 mmol/L (ref 101–111)
Creatinine, Ser: 1.83 mg/dL — ABNORMAL HIGH (ref 0.61–1.24)
GFR calc Af Amer: 36 mL/min — ABNORMAL LOW (ref >60)
GFR calc non Af Amer: 31 mL/min — ABNORMAL LOW (ref >60)
GLUCOSE: 207 mg/dL — AB (ref 65–99)
Potassium: 3.5 mmol/L (ref 3.5–5.1)
SODIUM: 137 mmol/L (ref 135–145)
TOTAL PROTEIN: 6.2 g/dL — AB (ref 6.5–8.1)
Total Bilirubin: 1.4 mg/dL — ABNORMAL HIGH (ref 0.3–1.2)

## 2016-07-30 LAB — I-STAT ARTERIAL BLOOD GAS, ED
ACID-BASE DEFICIT: 3 mmol/L — AB (ref 0.0–2.0)
Bicarbonate: 20.6 mmol/L (ref 20.0–28.0)
O2 SAT: 95 %
PH ART: 7.455 — AB (ref 7.350–7.450)
PO2 ART: 69 mmHg — AB (ref 83.0–108.0)
Patient temperature: 98.6
TCO2: 21 mmol/L (ref 0–100)
pCO2 arterial: 29.2 mmHg — ABNORMAL LOW (ref 32.0–48.0)

## 2016-07-30 LAB — CBC
HCT: 28.9 % — ABNORMAL LOW (ref 39.0–52.0)
HEMOGLOBIN: 9 g/dL — AB (ref 13.0–17.0)
MCH: 23.9 pg — AB (ref 26.0–34.0)
MCHC: 31.1 g/dL (ref 30.0–36.0)
MCV: 76.9 fL — ABNORMAL LOW (ref 78.0–100.0)
Platelets: 216 10*3/uL (ref 150–400)
RBC: 3.76 MIL/uL — ABNORMAL LOW (ref 4.22–5.81)
RDW: 17.3 % — ABNORMAL HIGH (ref 11.5–15.5)
WBC: 6.5 10*3/uL (ref 4.0–10.5)

## 2016-07-30 LAB — DIFFERENTIAL
BASOS ABS: 0 10*3/uL (ref 0.0–0.1)
BASOS PCT: 0 %
Eosinophils Absolute: 0 10*3/uL (ref 0.0–0.7)
Eosinophils Relative: 0 %
LYMPHS PCT: 10 %
Lymphs Abs: 0.6 10*3/uL — ABNORMAL LOW (ref 0.7–4.0)
Monocytes Absolute: 0.3 10*3/uL (ref 0.1–1.0)
Monocytes Relative: 4 %
NEUTROS ABS: 5.6 10*3/uL (ref 1.7–7.7)
Neutrophils Relative %: 86 %

## 2016-07-30 LAB — URINALYSIS, ROUTINE W REFLEX MICROSCOPIC
Bilirubin Urine: NEGATIVE
GLUCOSE, UA: NEGATIVE mg/dL
Hgb urine dipstick: NEGATIVE
KETONES UR: NEGATIVE mg/dL
NITRITE: NEGATIVE
PH: 5 (ref 5.0–8.0)
PROTEIN: 30 mg/dL — AB
Specific Gravity, Urine: 1.019 (ref 1.005–1.030)

## 2016-07-30 LAB — RETICULOCYTES
RBC.: 4.03 MIL/uL — ABNORMAL LOW (ref 4.22–5.81)
Retic Count, Absolute: 76.6 10*3/uL (ref 19.0–186.0)
Retic Ct Pct: 1.9 % (ref 0.4–3.1)

## 2016-07-30 LAB — PROCALCITONIN: Procalcitonin: <0.1 ng/mL

## 2016-07-30 LAB — IRON AND TIBC
IRON: 21 ug/dL — AB (ref 45–182)
SATURATION RATIOS: 6 % — AB (ref 17.9–39.5)
TIBC: 340 ug/dL (ref 250–450)
UIBC: 319 ug/dL

## 2016-07-30 LAB — LIPASE, BLOOD: Lipase: 40 U/L (ref 11–51)

## 2016-07-30 LAB — TROPONIN I
TROPONIN I: 0.03 ng/mL — AB (ref <0.03)
TROPONIN I: 0.04 ng/mL — AB (ref <0.03)

## 2016-07-30 LAB — MAGNESIUM: Magnesium: 2.1 mg/dL (ref 1.7–2.4)

## 2016-07-30 LAB — GLUCOSE, CAPILLARY
GLUCOSE-CAPILLARY: 178 mg/dL — AB (ref 65–99)
Glucose-Capillary: 162 mg/dL — ABNORMAL HIGH (ref 65–99)

## 2016-07-30 LAB — FERRITIN: Ferritin: 29 ng/mL (ref 24–336)

## 2016-07-30 LAB — PHOSPHORUS: Phosphorus: 3.7 mg/dL (ref 2.5–4.6)

## 2016-07-30 LAB — VITAMIN B12: VITAMIN B 12: 542 pg/mL (ref 180–914)

## 2016-07-30 LAB — CLOSTRIDIUM DIFFICILE BY PCR: Toxigenic C. Difficile by PCR: NEGATIVE

## 2016-07-30 MED ORDER — SODIUM CHLORIDE 0.9 % IV SOLN
INTRAVENOUS | Status: DC
  Administered 2016-07-30: 18:00:00 via INTRAVENOUS

## 2016-07-30 MED ORDER — IPRATROPIUM-ALBUTEROL 0.5-2.5 (3) MG/3ML IN SOLN
3.0000 mL | Freq: Once | RESPIRATORY_TRACT | Status: AC
Start: 2016-07-30 — End: 2016-07-30
  Administered 2016-07-30: 3 mL via RESPIRATORY_TRACT
  Filled 2016-07-30: qty 3

## 2016-07-30 MED ORDER — AMLODIPINE BESYLATE 5 MG PO TABS
5.0000 mg | ORAL_TABLET | Freq: Every day | ORAL | Status: DC
  Administered 2016-07-31: 5 mg via ORAL
  Filled 2016-07-30: qty 1

## 2016-07-30 MED ORDER — IPRATROPIUM-ALBUTEROL 0.5-2.5 (3) MG/3ML IN SOLN
3.0000 mL | RESPIRATORY_TRACT | Status: DC
  Administered 2016-07-30 – 2016-07-31 (×3): 3 mL via RESPIRATORY_TRACT
  Filled 2016-07-30 (×3): qty 3

## 2016-07-30 MED ORDER — ACETAMINOPHEN 650 MG RE SUPP: 650.0000 mg | Freq: Four times a day (QID) | RECTAL | Status: DC | PRN

## 2016-07-30 MED ORDER — NITROGLYCERIN 0.4 MG SL SUBL: 0.4000 mg | SUBLINGUAL_TABLET | SUBLINGUAL | Status: DC | PRN

## 2016-07-30 MED ORDER — GI COCKTAIL ~~LOC~~: 30.0000 mL | Freq: Four times a day (QID) | ORAL | Status: DC | PRN

## 2016-07-30 MED ORDER — CARVEDILOL 12.5 MG PO TABS
12.5000 mg | ORAL_TABLET | Freq: Two times a day (BID) | ORAL | Status: DC
  Administered 2016-07-30 – 2016-08-05 (×12): 12.5 mg via ORAL
  Filled 2016-07-30 (×12): qty 1

## 2016-07-30 MED ORDER — HYDRALAZINE HCL 20 MG/ML IJ SOLN: 5.0000 mg | INTRAMUSCULAR | Status: DC | PRN

## 2016-07-30 MED ORDER — LEVOTHYROXINE SODIUM 112 MCG PO TABS
112.0000 ug | ORAL_TABLET | Freq: Every day | ORAL | Status: DC
  Administered 2016-07-31 – 2016-08-03 (×4): 112 ug via ORAL
  Filled 2016-07-30 (×5): qty 1

## 2016-07-30 MED ORDER — FINASTERIDE 5 MG PO TABS
5.0000 mg | ORAL_TABLET | Freq: Every day | ORAL | Status: DC
  Administered 2016-07-31 – 2016-08-05 (×6): 5 mg via ORAL
  Filled 2016-07-30 (×6): qty 1

## 2016-07-30 MED ORDER — FUROSEMIDE 10 MG/ML IJ SOLN
80.0000 mg | Freq: Two times a day (BID) | INTRAMUSCULAR | Status: AC
  Administered 2016-07-30 – 2016-08-01 (×3): 80 mg via INTRAVENOUS
  Filled 2016-07-30 (×3): qty 8

## 2016-07-30 MED ORDER — AMIODARONE HCL 200 MG PO TABS
100.0000 mg | ORAL_TABLET | Freq: Every day | ORAL | Status: DC
  Administered 2016-07-30 – 2016-08-05 (×7): 100 mg via ORAL
  Filled 2016-07-30 (×7): qty 1

## 2016-07-30 MED ORDER — PERFLUTREN LIPID MICROSPHERE
INTRAVENOUS | Status: AC
  Administered 2016-07-30: 17:00:00
  Filled 2016-07-30: qty 10

## 2016-07-30 MED ORDER — INSULIN ASPART 100 UNIT/ML ~~LOC~~ SOLN
0.0000 [IU] | Freq: Every day | SUBCUTANEOUS | Status: DC
  Administered 2016-08-01: 2 [IU] via SUBCUTANEOUS
  Administered 2016-08-04: 3 [IU] via SUBCUTANEOUS

## 2016-07-30 MED ORDER — ISOSORBIDE MONONITRATE ER 30 MG PO TB24
30.0000 mg | ORAL_TABLET | Freq: Every day | ORAL | Status: DC
  Administered 2016-07-31 – 2016-08-05 (×6): 30 mg via ORAL
  Filled 2016-07-30 (×6): qty 1

## 2016-07-30 MED ORDER — ONDANSETRON HCL 4 MG PO TABS: 4.0000 mg | ORAL_TABLET | Freq: Four times a day (QID) | ORAL | Status: DC | PRN

## 2016-07-30 MED ORDER — ACETAMINOPHEN 325 MG PO TABS: 650.0000 mg | ORAL_TABLET | Freq: Four times a day (QID) | ORAL | Status: DC | PRN

## 2016-07-30 MED ORDER — CLOPIDOGREL BISULFATE 75 MG PO TABS
75.0000 mg | ORAL_TABLET | Freq: Every day | ORAL | Status: DC
  Administered 2016-07-31 – 2016-08-05 (×6): 75 mg via ORAL
  Filled 2016-07-30 (×6): qty 1

## 2016-07-30 MED ORDER — IPRATROPIUM-ALBUTEROL 0.5-2.5 (3) MG/3ML IN SOLN: 3.0000 mL | Freq: Four times a day (QID) | RESPIRATORY_TRACT | Status: DC

## 2016-07-30 MED ORDER — ALBUTEROL SULFATE (2.5 MG/3ML) 0.083% IN NEBU
2.5000 mg | INHALATION_SOLUTION | RESPIRATORY_TRACT | Status: DC | PRN
  Filled 2016-07-30: qty 3

## 2016-07-30 MED ORDER — INSULIN ASPART 100 UNIT/ML ~~LOC~~ SOLN
0.0000 [IU] | Freq: Three times a day (TID) | SUBCUTANEOUS | Status: DC
  Administered 2016-07-31 (×3): 2 [IU] via SUBCUTANEOUS
  Administered 2016-08-01: 3 [IU] via SUBCUTANEOUS
  Administered 2016-08-01: 1 [IU] via SUBCUTANEOUS
  Administered 2016-08-01 – 2016-08-02 (×2): 2 [IU] via SUBCUTANEOUS
  Administered 2016-08-02: 3 [IU] via SUBCUTANEOUS
  Administered 2016-08-02: 2 [IU] via SUBCUTANEOUS
  Administered 2016-08-03: 7 [IU] via SUBCUTANEOUS
  Administered 2016-08-03 (×2): 1 [IU] via SUBCUTANEOUS
  Administered 2016-08-04: 5 [IU] via SUBCUTANEOUS
  Administered 2016-08-04 (×2): 2 [IU] via SUBCUTANEOUS
  Administered 2016-08-05: 3 [IU] via SUBCUTANEOUS

## 2016-07-30 MED ORDER — ATORVASTATIN CALCIUM 10 MG PO TABS
10.0000 mg | ORAL_TABLET | Freq: Every day | ORAL | Status: DC
  Administered 2016-07-31 – 2016-08-05 (×6): 10 mg via ORAL
  Filled 2016-07-30 (×6): qty 1

## 2016-07-30 MED ORDER — FUROSEMIDE 10 MG/ML IJ SOLN
80.0000 mg | Freq: Once | INTRAMUSCULAR | Status: AC
  Administered 2016-07-30: 80 mg via INTRAVENOUS
  Filled 2016-07-30: qty 8

## 2016-07-30 MED ORDER — ONDANSETRON HCL 4 MG/2ML IJ SOLN: 4.0000 mg | Freq: Four times a day (QID) | INTRAMUSCULAR | Status: DC | PRN

## 2016-07-30 NOTE — Progress Notes (Signed)
  Echocardiogram 2D Echocardiogram has been performed.  Evan [name] 07/30/2016, 4:56 PM

## 2016-07-30 NOTE — ED Notes (Signed)
Dr. Merrell at bedside. 

## 2016-07-30 NOTE — H&P (Signed)
History and Physical    GAD AYMOND ZOX:[id number] DOB: [birth year] DOA: 07/30/2016  PCP: Deland Pretty, MD Patient coming from: home  Chief Complaint: SOB and CP  HPI: Evan [name] is a 83 y.o. male with medical history significant of clear frequency, TIA, CAD/NSTEMI/CABG/PAF/CHF/heart block status post pacer maker placement, thyroid dysfunction, syncope, nephrolithiasis, hypertension, hyperlipidemia, GERD, diabetes, CHF, BPH.   SOB and fatigue. Started 2 days ago. Associated w/ intermittent, short lived chest pain episodes which pt states are normal for him. Oftentimes patient will use nitroglycerin when he has this chest pain episodes with immediate relief. Associated with gradual weight gain and lower extremity swelling.  Diarrhea: Pt admitted in June for GI bleed which has resolved. Pt reports 4-5 days aof watery stools. Denies melena, hematochezia, bright red blood per rectum. Patient has a partially-threaded 4 episodes per day. Denies any abdominal pain, nausea, vomiting, fevers, chills, dysuria, frequency. Patient is not taken anything for her symptoms.   ED Course: Objective findings outlined below. Lasix given in ED.  Review of Systems: As per HPI otherwise all other systems reviewed and are negative  Ambulatory Status: no significant restrictions.   Past Medical History:  Diagnosis Date  . AC joint dislocation   . ALLERGIC RHINITIS   . Allergic rhinitis   . Anginal pain ([place])   . Arthritis   . Atrial tachycardia ([place])   . BPH (benign prostatic hyperplasia)   . CAD   . CHF (congestive heart failure) ([place])   . Diarrhea   . DM    type 2  . GERD   . GERD (gastroesophageal reflux disease)   . H/O: GI bleed   . HYPERCHOLESTEROLEMIA   . HYPERTENSION   . Iron deficiency anemia   . Nephrolithiasis   . NEPHROLITHIASIS, HX OF   . Paroxysmal atrial fibrillation (HCC)   . Presence of permanent cardiac pacemaker 12/12/2013  . Rib fracture   . Stable angina (HCC)     . Syncope   . Thyroid disease   . TRANSIENT ISCHEMIC ATTACKS, HX OF   . 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 Urinary frequency   . Vertigo   . Weakness of left leg     Past Surgical History:  Procedure Laterality Date  . AV FISTULA REPAIR    . BACK SURGERY  2000'S  . CARDIAC CATHETERIZATION  12/11/2013   Procedure: CORONARY STENT INTERVENTION;  Surgeon: Burnell Blanks, MD;  Location: [facility] CATH LAB;  Service: Cardiovascular;;  DES 3.5 x 15 Resolute to seq SVG of OM1/OM2   . CORONARY ARTERY BYPASS GRAFT  1996  . CORONARY STENT PLACEMENT  12/11/13   SVG-OM DES  . FEMORAL ARTERY ANEURYSM REPAIR     feroral pseudo-aneurysm repair  . INSERT / REPLACE / REMOVE PACEMAKER    . LEFT HEART CATHETERIZATION WITH CORONARY/GRAFT ANGIOGRAM N/A 12/11/2013   Procedure: LEFT HEART CATHETERIZATION WITH Beatrix Fetters;  Surgeon: Burnell Blanks, MD;  Location: [facility] CATH LAB;  Service: Cardiovascular;  Laterality: N/A;  . PERMANENT PACEMAKER INSERTION N/A 12/12/2013   MDT ADDRL1 pacemaker implnated by Dr Rayann Heman  . PTCA  1980's    Social History   Social History  . Marital status: Married    Spouse name: N/A  . Number of children: N/A  . Years of education: N/A   Occupational History  . Not on file.   Social History Main Topics  . Smoking status: Former Smoker  Quit date: 01/06/1956  . Smokeless tobacco: Never Used  . Alcohol use No  . Drug use: No  . Sexual activity: Not Currently   Other Topics Concern  . Not on file   Social History Narrative   Pt is married and lives with his wife.  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      Allergies  Allergen Reactions  . Clonidine Derivatives     unknown reaction   . Codeine     unknown reaction   . Meperidine Hcl     REACTION: Hypotension  . Motrin [Ibuprofen]     unknown reaction   . Other     [place] - unknown reaction  . Penicillins      unknown reaction   . Demerol Other (See Comments)    hypotension  . Heparin Other (See Comments)    hypotension  . Metformin And Related Diarrhea    Family History  Problem Relation Age of Onset  . Diabetes Mother   . Coronary artery disease Father       Prior to Admission medications   Medication Sig Start Date End Date Taking? Authorizing Provider  amiodarone (PACERONE) 200 MG tablet Take 0.5 tablets (100 mg total) by mouth daily. 04/13/16   Allred, Jeneen Rinks, MD  amLODipine (NORVASC) 5 MG tablet Take 1 tablet (5 mg total) by mouth daily. 04/06/16   Nahser, Wonda Cheng, MD  atorvastatin (LIPITOR) 10 MG tablet Take 1 tablet (10 mg total) by mouth daily. 04/04/15   [name], MD  carvedilol (COREG) 12.5 MG tablet TAKE 1 TABLET BY MOUTH TWICE DAILY WITH A MEAL 04/13/16   [name], MD  clopidogrel (PLAVIX) 75 MG tablet Take 1 tablet (75 mg total) by mouth daily with breakfast. 06/29/16   Raiford Noble Latif, DO  finasteride (PROSCAR) 5 MG tablet Take 5 mg by mouth daily.    [provider]  furosemide (LASIX) 80 MG tablet Take 80 mg by mouth daily.    [provider]  isosorbide mononitrate (IMDUR) 30 MG 24 hr tablet TAKE 1 TABLET BY MOUTH DAILY GENERIC EQUIVALENT FOR IMDUR 06/29/16   [name], MD  JANUVIA 50 MG tablet Take 50 mg by mouth daily. 03/31/16   [provider]  levothyroxine (SYNTHROID, LEVOTHROID) 112 MCG tablet Take 112 mcg by mouth daily before breakfast.    [provider]  nitroGLYCERIN (NITROSTAT) 0.4 MG SL tablet PLACE 1 TABLET (0.4 MG TOTAL) UNDER THE TONGUE EVERY 5 (FIVE) MINUTES AS NEEDED FOR CHEST PAIN. 04/04/15   [name], MD  pantoprazole (PROTONIX) 40 MG tablet Take 1 tablet (40 mg total) by mouth daily. 06/27/16   Raiford Noble Ashton, DO    Physical Exam: Vitals:   07/30/16 1145 07/30/16 1200 07/30/16 1230 07/30/16 1300  BP: (!) 155/54 (!) 164/56 (!) 176/54   Pulse: (!) 59 60 (!) 59 62  Resp: (!) 26 (!) 28  (!) 28 (!) 31  Temp:      TempSrc:      SpO2: 90% (!) 89% (!) 87% 99%  Weight:      Height:         General:  Appears calm and comfortable Eyes:  PERRL, EOMI, normal lids, iris ENT:  Hard of hearing, mmm Neck:  no LAD, masses or thyromegaly Cardiovascular:  RRR, II/VI systolic murmur. 1+ LE edema.  Respiratory: Wheezing in all lung fields with diminished  breath sounds in the right with few crackles. Increased effort. 86% O2 saturations on 2 L nasal cannula while speaking. When at rest O2 saturations in the mid 90s on 2 L nasal cannula. Abdomen:  soft, ntnd, NABS Skin:  no rash or induration seen on limited exam Musculoskeletal:  grossly normal tone BUE/BLE, good ROM, no bony abnormality Psychiatric:  grossly normal mood and affect, speech fluent and appropriate, AOx3 Neurologic:  CN 2-12 grossly intact, moves all extremities in coordinated fashion, sensation intact  Labs on Admission: I have personally reviewed following labs and imaging studies  CBC:  Recent Labs Lab 07/30/16 0852  WBC 6.5  NEUTROABS 5.6  HGB 9.0*  HCT 28.9*  MCV 76.9*  PLT 818   Basic Metabolic Panel:  Recent Labs Lab 07/30/16 0852  NA 137  K 3.5  CL 103  CO2 21*  GLUCOSE 207*  BUN 23*  CREATININE 1.83*  CALCIUM 8.6*   GFR: Estimated Creatinine Clearance: 25.1 mL/min (A) (by C-G formula based on SCr of 1.83 mg/dL (H)). Liver Function Tests:  Recent Labs Lab 07/30/16 0852  AST 16  ALT 13*  ALKPHOS 65  BILITOT 1.4*  PROT 6.2*  ALBUMIN 3.5    Recent Labs Lab 07/30/16 0852  LIPASE 40   No results for input(s): AMMONIA in the last 168 hours. Coagulation Profile: No results for input(s): INR, PROTIME in the last 168 hours. Cardiac Enzymes:  Recent Labs Lab 07/30/16 0852  TROPONINI 0.03*   BNP (last 3 results) No results for input(s): PROBNP in the last 8760 hours. HbA1C: No results for input(s): HGBA1C in the last 72 hours. CBG: No results for input(s): GLUCAP in the  last 168 hours. Lipid Profile: No results for input(s): CHOL, HDL, LDLCALC, TRIG, CHOLHDL, LDLDIRECT in the last 72 hours. Thyroid Function Tests: No results for input(s): TSH, T4TOTAL, FREET4, T3FREE, THYROIDAB in the last 72 hours. Anemia Panel: No results for input(s): VITAMINB12, FOLATE, FERRITIN, TIBC, IRON, RETICCTPCT in the last 72 hours. Urine analysis:    Component Value Date/Time   COLORURINE YELLOW 07/30/2016 1017   APPEARANCEUR CLEAR 07/30/2016 1017   LABSPEC 1.019 07/30/2016 1017   PHURINE 5.0 07/30/2016 1017   GLUCOSEU NEGATIVE 07/30/2016 1017   HGBUR NEGATIVE 07/30/2016 1017   BILIRUBINUR NEGATIVE 07/30/2016 1017   KETONESUR NEGATIVE 07/30/2016 1017   PROTEINUR 30 (A) 07/30/2016 1017   UROBILINOGEN 0.2 12/07/2013 1737   NITRITE NEGATIVE 07/30/2016 1017   LEUKOCYTESUR MODERATE (A) 07/30/2016 1017    Creatinine Clearance: Estimated Creatinine Clearance: 25.1 mL/min (A) (by C-G formula based on SCr of 1.83 mg/dL (H)).  Sepsis Labs: @LABRCNTIP (procalcitonin:4,lacticidven:4) )No results found for this or any previous visit (from the past 240 hour(s)).   Radiological Exams on Admission: Dg Chest 2 View  Result Date: 07/30/2016 CLINICAL DATA:  Chronic shortness of breath.  Hypoxia. EXAM: CHEST  2 VIEW COMPARISON:  06/23/2016. FINDINGS: Cardiac pacer noted stable position. Prior CABG. Cardiomegaly again noted. Progressive bilateral pulmonary pulmonary interstitial infiltrates consistent pulmonary edema. Small bilateral pleural effusions. IMPRESSION: Cardiac pacer stable position. Prior CABG. Progressive changes of congestive heart failure with bilateral pulmonary interstitial edema and small pleural effusions . Electronically Signed   By: Marcello Moores  Register   On: 07/30/2016 10:52    EKG: Independently reviewed. ventricularly paced rhythm. No significant change from previous  Assessment/Plan Active Problems:   HYPERCHOLESTEROLEMIA   Essential hypertension   CABG '96    GERD   PAF (paroxysmal atrial fibrillation) ([place])   Pacemaker implanted 12/12/13 (  MDT)   Complete heart block (HCC)   Acute on chronic systolic and diastolic heart failure, NYHA class 4 (HCC)   Stage 3 chronic renal impairment associated with type 2 diabetes mellitus (HCC)   CHF exacerbation (HCC)   Atypical chest pain   Acute respiratory failure with hypoxia (HCC)   Diarrhea   Acute respiratory failure: Likely primarily due to CHF exacerbation but cannot r/o other acute respiratory etiology given physical exam findings. Pt wheezing diffusely w/ some crackles. O2 demand and effort appears to be increasing during time in ED. CXR w/o definitive findings concerning for CAP but cannot r/o at this time. Afebrile, tachypnea noted. WBC 6.5. BPN 1276. Doubt PE. - Procalcitonin, Respiratory viral panel.  - Duonebs Q4prn, O2 PRN - ABG - Consider starting ABX based on results of above studies.  - Lasix 80 IV BID, Strict I/O, Daily wts, Echo - Repeat CXR in am  CP: pt w/ extensive cardiac hx including CAD/CABG/NSTEMI/PAF/CHF/Heart block. CP episodes as described by pt in HPI are his baseline and have been occurring for a very long time. Relieved w/ nitro. EKG remains unchanged from previous and no signs of ACS. Addtionally Tropnonin was 0.03. Suspect pts complaints and minimally elevated troponin are from cardiac strain from CHF exacerbation - tele - Cycle trop - Continue Amiodarone, plavix, lipitor  Diarrhea: several day h/o diarrhea that appears to be worsening. No fevers or elevation of WBC. No known recent ABX and no ABD pain. Favor viral etiology but cannot r/o Cdiff at this time - Stool pathogen, Cdiff,  - Start imodium if pathogen panel and Cdiff are negative.  - hold PPI - IVF  CKD: Cr 1.83. Baseline 1.5.  - BMP in am  Anemia: Due to chronic disease. Microcytic. Hemoglobin 9. Baseline approximately 9.5-10. - Anemia panel - CBC in am  HTN: - continue Imdur, Norvasc, Coreg  GERD: -  hold protonix due to GI sx as above  DM: - SSI  DVT prophylaxis: SCD  Code Status: DNR  Family Communication: none  Disposition Plan: pending improvement   Consults called: none  Admission status: observation    Evan Londo J MD [facility]  If 7PM-7AM, please contact night-coverage [email or web address] Password [email or web address]  07/30/2016, 1:46 PM

## 2016-07-30 NOTE — ED Notes (Signed)
Troponin 0.03 Dr. Oleta Mouse made aware

## 2016-07-30 NOTE — ED Notes (Signed)
Attempted to call report

## 2016-07-30 NOTE — ED Triage Notes (Signed)
Pt arrives from [facility] reporting diarrhea and weakness ongoing for past week.  Pt reports SOB and CP chronically, EMS reports pt took 1 NTG sl with relief..  Pt denies blood in stool, denies N/V, reports some cramping abd pain.  RA sats 85%, pt placed on 2L [place], sats improved to 95%.  No focal deficits noted, pt denies COP or SOB at this time. Dr. Oleta Mouse at bedside.

## 2016-07-30 NOTE — ED Provider Notes (Signed)
[facility] DEPT Provider Note   CSN: [id number] Arrival date & time: 07/30/16  4497     History   Chief Complaint Chief Complaint  Patient presents with  . Weakness  . Diarrhea    HPI Evan [name] is a 83 y.o. male.  The history is provided by the patient.  Diarrhea   This is a new problem. The current episode started more than 2 days ago. The problem occurs 2 to 4 times per day. The problem has not changed since onset.The stool consistency is described as watery. There has been no fever. Associated symptoms include cough. Pertinent negatives include no abdominal pain, no vomiting, no chills and no headaches. He has tried nothing for the symptoms. The treatment provided no relief.   83 year old male who presents with diarrhea and generalized weakness. He has a history of paroxysmal atrial fibrillation, coronary artery disease status post CABG, hypertension, hyperlipidemia, complete heart block with pacemaker placement, diabetes, and aortic stenosis with systolic and diastolic heart failure. I EF of 45-50% with grade 1 diastolic dysfunction on 5300 echo.  Patient reports that he was recently admitted in June for GI bleed. States that he has been having some weakness since the admission, but over the past week has felt more weak and fatigued. Over the past week also with watery diarrhea that is not bloody or melanotic. States 3 episodes daily. Some intermittent mild abdominal cramping that comes and goes but no nausea or vomiting, fevers or chills, or urinary complaints. Has not tried any treatments for diarrhea.   Has also noted some increasing shortness of breath and episodic chest pain that has been coming and going over the past few days. States that he has been taking increased doses of nitroglycerin over the past 2 days including one this morning. States that normally she will have chest pain with exercise, and it is not uncommon for him. States that he had brief chest pain  while moving around today, and took one nitroglycerin with relief. Denies orthopnea or PND. Has had mild nonproductive cough.  Past Medical History:  Diagnosis Date  . AC joint dislocation   . ALLERGIC RHINITIS   . Allergic rhinitis   . Anginal pain ([place])   . Arthritis   . Atrial tachycardia ([place])   . BPH (benign prostatic hyperplasia)   . CAD   . CHF (congestive heart failure) ([place])   . Diarrhea   . DM    type 2  . GERD   . GERD (gastroesophageal reflux disease)   . H/O: GI bleed   . HYPERCHOLESTEROLEMIA   . HYPERTENSION   . Iron deficiency anemia   . Nephrolithiasis   . NEPHROLITHIASIS, HX OF   . Paroxysmal atrial fibrillation (HCC)   . Presence of permanent cardiac pacemaker 12/12/2013  . Rib fracture   . Stable angina (HCC)   . Syncope   . Thyroid disease   . TRANSIENT ISCHEMIC ATTACKS, HX OF   . 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 Urinary frequency   . Vertigo   . Weakness of left leg     Patient Active Problem List   Diagnosis Date Noted  . CHF exacerbation ([place]) 07/30/2016  . NSTEMI (non-ST elevated myocardial infarction) ([place]) 06/24/2016  . Rectal bleeding 06/23/2016  . Acute blood loss anemia 06/23/2016  . Patient had 2 or more falls in past year 01/20/2014  . Stage 3 chronic renal impairment associated with type 2 diabetes  mellitus ([place]) 01/20/2014  . Acute on chronic systolic and diastolic heart failure, NYHA class 4 ([place]) 01/16/2014  . CAD S/P SVG-MO1/OM2 DES 12/11/13 12/13/2013  . Pacemaker implanted 12/12/13 (MDT) 12/13/2013  . Complete heart block ([place]) 12/13/2013  . HOH (hard of hearing) 12/13/2013  . Syncope 12/12/2013  . 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 Sick sinus syndrome ([place]) 12/10/2013  . PAF (paroxysmal atrial fibrillation) ([place]) 12/10/2013  . Tachy-brady syndrome ([place]) 12/10/2013  . Symptomatic bradycardia 12/07/2013  . Heart block 03/22/2013  . CAD (coronary artery disease) of artery bypass graft 03/23/2012    . Angina pectoris- rate related 03/23/2012  . Carotid bruit 03/23/2012  . CARDIAC MURMUR 12/03/2009  . Type 2 diabetes with nephropathy ([place]) 12/02/2009  . HYPERCHOLESTEROLEMIA 12/02/2009  . ANEMIA 12/02/2009  . Essential hypertension 12/02/2009  . CABG '96 12/02/2009  . ALLERGIC RHINITIS 12/02/2009  . GERD 12/02/2009  . TRANSIENT ISCHEMIC ATTACKS, HX OF 12/02/2009  . NEPHROLITHIASIS, HX OF 12/02/2009    Past Surgical History:  Procedure Laterality Date  . AV FISTULA REPAIR    . BACK SURGERY  2000'S  . CARDIAC CATHETERIZATION  12/11/2013   Procedure: CORONARY STENT INTERVENTION;  Surgeon: Burnell Blanks, MD;  Location: [facility] CATH LAB;  Service: Cardiovascular;;  DES 3.5 x 15 Resolute to seq SVG of OM1/OM2   . CORONARY ARTERY BYPASS GRAFT  1996  . CORONARY STENT PLACEMENT  12/11/13   SVG-OM DES  . FEMORAL ARTERY ANEURYSM REPAIR     feroral pseudo-aneurysm repair  . INSERT / REPLACE / REMOVE PACEMAKER    . LEFT HEART CATHETERIZATION WITH CORONARY/GRAFT ANGIOGRAM N/A 12/11/2013   Procedure: LEFT HEART CATHETERIZATION WITH Beatrix Fetters;  Surgeon: Burnell Blanks, MD;  Location: [facility] CATH LAB;  Service: Cardiovascular;  Laterality: N/A;  . PERMANENT PACEMAKER INSERTION N/A 12/12/2013   MDT ADDRL1 pacemaker implnated by Dr Rayann Heman  . PTCA  1980's       Home Medications    Prior to Admission medications   Medication Sig Start Date End Date Taking? Authorizing Provider  amiodarone (PACERONE) 200 MG tablet Take 0.5 tablets (100 mg total) by mouth daily. 04/13/16   Allred, Jeneen Rinks, MD  amLODipine (NORVASC) 5 MG tablet Take 1 tablet (5 mg total) by mouth daily. 04/06/16   Nahser, Wonda Cheng, MD  atorvastatin (LIPITOR) 10 MG tablet Take 1 tablet (10 mg total) by mouth daily. 04/04/15   [name], MD  carvedilol (COREG) 12.5 MG tablet TAKE 1 TABLET BY MOUTH TWICE DAILY WITH A MEAL 04/13/16   [name], MD  clopidogrel (PLAVIX) 75 MG tablet Take 1 tablet (75 mg  total) by mouth daily with breakfast. 06/29/16   Raiford Noble Latif, DO  finasteride (PROSCAR) 5 MG tablet Take 5 mg by mouth daily.    [provider]  furosemide (LASIX) 80 MG tablet Take 80 mg by mouth daily.    [provider]  isosorbide mononitrate (IMDUR) 30 MG 24 hr tablet TAKE 1 TABLET BY MOUTH DAILY GENERIC EQUIVALENT FOR IMDUR 06/29/16   [name], MD  JANUVIA 50 MG tablet Take 50 mg by mouth daily. 03/31/16   [provider]  levothyroxine (SYNTHROID, LEVOTHROID) 112 MCG tablet Take 112 mcg by mouth daily before breakfast.    [provider]  nitroGLYCERIN (NITROSTAT) 0.4 MG SL tablet PLACE 1 TABLET (0.4 MG TOTAL) UNDER THE TONGUE EVERY 5 (FIVE) MINUTES AS NEEDED FOR CHEST PAIN. 04/04/15   [name], MD  pantoprazole (  PROTONIX) 40 MG tablet Take 1 tablet (40 mg total) by mouth daily. 06/27/16   Kerney Elbe, DO    Family History Family History  Problem Relation Age of Onset  . Diabetes Mother   . Coronary artery disease Father     Social History Social History  Substance Use Topics  . Smoking status: Former Smoker    Quit date: 01/06/1956  . Smokeless tobacco: Never Used  . Alcohol use No     Allergies   Clonidine derivatives; Codeine; Meperidine hcl; Motrin [ibuprofen]; Other; Penicillins; Demerol; Heparin; and Metformin and related   Review of Systems Review of Systems  Constitutional: Negative for chills.  Respiratory: Positive for cough.   Gastrointestinal: Positive for diarrhea. Negative for abdominal pain and vomiting.  Neurological: Negative for headaches.  All other systems reviewed and are negative.    Physical Exam Updated Vital Signs BP (!) 155/54   Pulse (!) 59   Temp 98.1 F (36.7 C) (Oral)   Resp (!) 26   Ht 5\' 7"  (1.702 m)   Wt 70.8 kg (156 lb)   SpO2 90%   BMI 24.43 kg/m   Physical Exam Physical Exam  Nursing note and vitals reviewed. Constitutional: Chronically ill appearing elderly  man, non-toxic, and in no acute distress Head: Normocephalic and atraumatic.  Mouth/Throat: Oropharynx is clear and moist.  Neck: Normal range of motion. Neck supple.  Cardiovascular: Normal rate and regular rhythm.  no significant LE edema. Pulmonary/Chest: Effort normal and breath sounds with crackles in the mid to low lung bilaterally.  Abdominal: Soft. There is no tenderness. There is no rebound and no guarding.  Musculoskeletal: Normal range of motion.  Neurological: Alert, no facial droop, fluent speech, moves all extremities symmetrically Skin: Skin is warm and dry.  Psychiatric: Cooperative   ED Treatments / Results  Labs (all labs ordered are listed, but only abnormal results are displayed) Labs Reviewed  COMPREHENSIVE METABOLIC PANEL - Abnormal; Notable for the following:       Result Value   CO2 21 (*)    Glucose, Bld 207 (*)    BUN 23 (*)    Creatinine, Ser 1.83 (*)    Calcium 8.6 (*)    Total Protein 6.2 (*)    ALT 13 (*)    Total Bilirubin 1.4 (*)    GFR calc non Af Amer 31 (*)    GFR calc Af Amer 36 (*)    All other components within normal limits  CBC - Abnormal; Notable for the following:    RBC 3.76 (*)    Hemoglobin 9.0 (*)    HCT 28.9 (*)    MCV 76.9 (*)    MCH 23.9 (*)    RDW 17.3 (*)    All other components within normal limits  URINALYSIS, ROUTINE W REFLEX MICROSCOPIC - Abnormal; Notable for the following:    Protein, ur 30 (*)    Leukocytes, UA MODERATE (*)    Bacteria, UA RARE (*)    Squamous Epithelial / LPF 0-5 (*)    All other components within normal limits  DIFFERENTIAL - Abnormal; Notable for the following:    Lymphs Abs 0.6 (*)    All other components within normal limits  TROPONIN I - Abnormal; Notable for the following:    Troponin I 0.03 (*)    All other components within normal limits  BRAIN NATRIURETIC PEPTIDE - Abnormal; Notable for the following:    B Natriuretic Peptide 1,276.9 (*)  All other components within normal limits   LIPASE, BLOOD    EKG  EKG Interpretation  Date/Time:  Thursday July 30 2016 08:44:24 EDT Ventricular Rate:  60 PR Interval:    QRS Duration: 190 QT Interval:  531 QTC Calculation: 531 R Axis:   -78 Text Interpretation:  Junctional rhythm Nonspecific IVCD with LAD LVH with secondary repolarization abnormality Ventricularly paced rhythm When compared to June 24, 2016 EKG appears similar  Confirmed by Brantley Stage [phone]) on 07/30/2016 8:53:08 AM       Radiology Dg Chest 2 View  Result Date: 07/30/2016 CLINICAL DATA:  Chronic shortness of breath.  Hypoxia. EXAM: CHEST  2 VIEW COMPARISON:  06/23/2016. FINDINGS: Cardiac pacer noted stable position. Prior CABG. Cardiomegaly again noted. Progressive bilateral pulmonary pulmonary interstitial infiltrates consistent pulmonary edema. Small bilateral pleural effusions. IMPRESSION: Cardiac pacer stable position. Prior CABG. Progressive changes of congestive heart failure with bilateral pulmonary interstitial edema and small pleural effusions . Electronically Signed   By: Marcello Moores  Register   On: 07/30/2016 10:52    Procedures Procedures (including critical care time)  Medications Ordered in ED Medications  furosemide (LASIX) injection 80 mg (not administered)     Initial Impression / Assessment and Plan / ED Course  I have reviewed the triage vital signs and the nursing notes.  Pertinent labs & imaging results that were available during my care of the patient were reviewed by me and considered in my medical decision making (see chart for details).     Presenting with diarrhea and generalized weakness. Also some shortness of breath over the past 2 days with increased episodes of chest pain. he is chest pain-free here in the emergency department. Is noted to be hypoxic on room air to 85% requiring 2 L of supplemental oxygen. Crackles and rhonchi noted through the lung. Chest x-ray visualized and shows evidence of interstitial edema and with  elevated BNP in the 1000. Presentation is consistent with acute CHF exacerbation. It will be provided 80 mg of IV Lasix. EKG is not acutely ischemic and troponin of 0.03 which is significantly improved from what he was in the hospital with in June. Dr. Marily Memos from hospitalist service will admit for ongoing treatment.  Final Clinical Impressions(s) / ED Diagnoses   Final diagnoses:  Acute on chronic systolic congestive heart failure [facility])    New Prescriptions New Prescriptions   No medications on file     Forde Dandy, MD 07/30/16 1206

## 2016-07-31 ENCOUNTER — Observation Stay (HOSPITAL_COMMUNITY): Payer: Medicare Other

## 2016-07-31 DIAGNOSIS — I10 Essential (primary) hypertension: Secondary | ICD-10-CM

## 2016-07-31 DIAGNOSIS — I25119 Atherosclerotic heart disease of native coronary artery with unspecified angina pectoris: Secondary | ICD-10-CM

## 2016-07-31 DIAGNOSIS — I509 Heart failure, unspecified: Secondary | ICD-10-CM

## 2016-07-31 DIAGNOSIS — Z87891 Personal history of nicotine dependence: Secondary | ICD-10-CM | POA: Diagnosis not present

## 2016-07-31 DIAGNOSIS — Z955 Presence of coronary angioplasty implant and graft: Secondary | ICD-10-CM | POA: Diagnosis not present

## 2016-07-31 DIAGNOSIS — I208 Other forms of angina pectoris: Secondary | ICD-10-CM

## 2016-07-31 DIAGNOSIS — E7800 Pure hypercholesterolemia, unspecified: Secondary | ICD-10-CM | POA: Diagnosis present

## 2016-07-31 DIAGNOSIS — I442 Atrioventricular block, complete: Secondary | ICD-10-CM

## 2016-07-31 DIAGNOSIS — R0600 Dyspnea, unspecified: Secondary | ICD-10-CM | POA: Diagnosis present

## 2016-07-31 DIAGNOSIS — E039 Hypothyroidism, unspecified: Secondary | ICD-10-CM | POA: Diagnosis present

## 2016-07-31 DIAGNOSIS — Z950 Presence of cardiac pacemaker: Secondary | ICD-10-CM

## 2016-07-31 DIAGNOSIS — I130 Hypertensive heart and chronic kidney disease with heart failure and stage 1 through stage 4 chronic kidney disease, or unspecified chronic kidney disease: Secondary | ICD-10-CM | POA: Diagnosis present

## 2016-07-31 DIAGNOSIS — E876 Hypokalemia: Secondary | ICD-10-CM

## 2016-07-31 DIAGNOSIS — N183 Chronic kidney disease, stage 3 (moderate): Secondary | ICD-10-CM | POA: Diagnosis present

## 2016-07-31 DIAGNOSIS — D509 Iron deficiency anemia, unspecified: Secondary | ICD-10-CM | POA: Diagnosis present

## 2016-07-31 DIAGNOSIS — Z79899 Other long term (current) drug therapy: Secondary | ICD-10-CM | POA: Diagnosis not present

## 2016-07-31 DIAGNOSIS — G4700 Insomnia, unspecified: Secondary | ICD-10-CM | POA: Diagnosis present

## 2016-07-31 DIAGNOSIS — E1122 Type 2 diabetes mellitus with diabetic chronic kidney disease: Secondary | ICD-10-CM | POA: Diagnosis present

## 2016-07-31 DIAGNOSIS — I255 Ischemic cardiomyopathy: Secondary | ICD-10-CM | POA: Diagnosis present

## 2016-07-31 DIAGNOSIS — J9601 Acute respiratory failure with hypoxia: Secondary | ICD-10-CM | POA: Diagnosis present

## 2016-07-31 DIAGNOSIS — E1151 Type 2 diabetes mellitus with diabetic peripheral angiopathy without gangrene: Secondary | ICD-10-CM | POA: Diagnosis present

## 2016-07-31 DIAGNOSIS — I2510 Atherosclerotic heart disease of native coronary artery without angina pectoris: Secondary | ICD-10-CM | POA: Diagnosis present

## 2016-07-31 DIAGNOSIS — Z7902 Long term (current) use of antithrombotics/antiplatelets: Secondary | ICD-10-CM | POA: Diagnosis not present

## 2016-07-31 DIAGNOSIS — I5043 Acute on chronic combined systolic (congestive) and diastolic (congestive) heart failure: Secondary | ICD-10-CM | POA: Diagnosis present

## 2016-07-31 DIAGNOSIS — I25810 Atherosclerosis of coronary artery bypass graft(s) without angina pectoris: Secondary | ICD-10-CM | POA: Diagnosis not present

## 2016-07-31 DIAGNOSIS — K219 Gastro-esophageal reflux disease without esophagitis: Secondary | ICD-10-CM | POA: Diagnosis present

## 2016-07-31 DIAGNOSIS — I480 Paroxysmal atrial fibrillation: Secondary | ICD-10-CM | POA: Diagnosis present

## 2016-07-31 DIAGNOSIS — N400 Enlarged prostate without lower urinary tract symptoms: Secondary | ICD-10-CM | POA: Diagnosis present

## 2016-07-31 DIAGNOSIS — Z8673 Personal history of transient ischemic attack (TIA), and cerebral infarction without residual deficits: Secondary | ICD-10-CM | POA: Diagnosis not present

## 2016-07-31 DIAGNOSIS — R197 Diarrhea, unspecified: Secondary | ICD-10-CM | POA: Diagnosis present

## 2016-07-31 DIAGNOSIS — Z951 Presence of aortocoronary bypass graft: Secondary | ICD-10-CM | POA: Diagnosis not present

## 2016-07-31 LAB — BASIC METABOLIC PANEL
ANION GAP: 11 (ref 5–15)
BUN: 20 mg/dL (ref 6–20)
CALCIUM: 8 mg/dL — AB (ref 8.9–10.3)
CO2: 23 mmol/L (ref 22–32)
CREATININE: 1.55 mg/dL — AB (ref 0.61–1.24)
Chloride: 106 mmol/L (ref 101–111)
GFR, EST AFRICAN AMERICAN: 44 mL/min — AB (ref >60)
GFR, EST NON AFRICAN AMERICAN: 38 mL/min — AB (ref >60)
Glucose, Bld: 161 mg/dL — ABNORMAL HIGH (ref 65–99)
Potassium: 3.2 mmol/L — ABNORMAL LOW (ref 3.5–5.1)
SODIUM: 140 mmol/L (ref 135–145)

## 2016-07-31 LAB — CBC
HEMATOCRIT: 27.7 % — AB (ref 39.0–52.0)
HEMOGLOBIN: 8.6 g/dL — AB (ref 13.0–17.0)
MCH: 23.6 pg — ABNORMAL LOW (ref 26.0–34.0)
MCHC: 31 g/dL (ref 30.0–36.0)
MCV: 76.1 fL — ABNORMAL LOW (ref 78.0–100.0)
Platelets: 238 10*3/uL (ref 150–400)
RBC: 3.64 MIL/uL — ABNORMAL LOW (ref 4.22–5.81)
RDW: 16.9 % — AB (ref 11.5–15.5)
WBC: 6.8 10*3/uL (ref 4.0–10.5)

## 2016-07-31 LAB — GASTROINTESTINAL PANEL BY PCR, STOOL (REPLACES STOOL CULTURE)
ADENOVIRUS F40/41: NOT DETECTED
ASTROVIRUS: NOT DETECTED
CAMPYLOBACTER SPECIES: NOT DETECTED
Cryptosporidium: NOT DETECTED
Cyclospora cayetanensis: NOT DETECTED
ENTAMOEBA HISTOLYTICA: NOT DETECTED
ENTEROTOXIGENIC E COLI (ETEC): NOT DETECTED
Enteroaggregative E coli (EAEC): NOT DETECTED
Enteropathogenic E coli (EPEC): NOT DETECTED
Giardia lamblia: NOT DETECTED
NOROVIRUS GI/GII: NOT DETECTED
Plesimonas shigelloides: NOT DETECTED
Rotavirus A: NOT DETECTED
SAPOVIRUS (I, II, IV, AND V): NOT DETECTED
SHIGA LIKE TOXIN PRODUCING E COLI (STEC): NOT DETECTED
Salmonella species: NOT DETECTED
Shigella/Enteroinvasive E coli (EIEC): NOT DETECTED
VIBRIO CHOLERAE: NOT DETECTED
Vibrio species: NOT DETECTED
Yersinia enterocolitica: NOT DETECTED

## 2016-07-31 LAB — GLUCOSE, CAPILLARY
GLUCOSE-CAPILLARY: 172 mg/dL — AB (ref 65–99)
Glucose-Capillary: 183 mg/dL — ABNORMAL HIGH (ref 65–99)
Glucose-Capillary: 183 mg/dL — ABNORMAL HIGH (ref 65–99)
Glucose-Capillary: 139 mg/dL — ABNORMAL HIGH (ref 65–99)

## 2016-07-31 MED ORDER — POTASSIUM CHLORIDE CRYS ER 20 MEQ PO TBCR
40.0000 meq | EXTENDED_RELEASE_TABLET | Freq: Once | ORAL | Status: AC
  Administered 2016-07-31: 40 meq via ORAL
  Filled 2016-07-31: qty 2

## 2016-07-31 MED ORDER — SACUBITRIL-VALSARTAN 24-26 MG PO TABS
1.0000 | ORAL_TABLET | Freq: Two times a day (BID) | ORAL | Status: DC
  Administered 2016-08-01 – 2016-08-05 (×9): 1 via ORAL
  Filled 2016-07-31 (×9): qty 1

## 2016-07-31 NOTE — Progress Notes (Signed)
Patient is active with [facility] for [facility] services as prior to admission for [facility] and PT; Aneta Mins [phone]

## 2016-07-31 NOTE — Consult Note (Addendum)
Cardiology Consult    Patient ID: Evan [name] MRN: [id number], DOB/AGE: 81-Dec-1925   Admit date: 07/30/2016 Date of Consult: 07/31/2016  Primary Physician: Deland Pretty, MD Primary Cardiologist: Nishan/Allred Requesting Provider: Algis Liming Reason for Consultation: HF  Evan [name] is a 83 y.o. male who is being seen today for the evaluation of heart failure at the request of Dr. Algis Liming.   Patient Profile    83 year old male with past medical history of CAD s/p CABG 5 vessel (1996), s/p DES to SVG --> OM 1/2 (2015), diabetes, chronic systolic heart failure, PVD, hypothyroidism, complete heart block status post pacemaker, PAF and PAT on amiodarone who presented with shortness of breath and chest pain.  Past Medical History   Past Medical History:  Diagnosis Date  . AC joint dislocation   . ALLERGIC RHINITIS   . Allergic rhinitis   . Anginal pain ([place])   . Arthritis   . Atrial tachycardia ([place])   . BPH (benign prostatic hyperplasia)   . CAD   . CHF (congestive heart failure) ([place])   . Diarrhea   . DM    type 2  . GERD   . GERD (gastroesophageal reflux disease)   . H/O: GI bleed   . HYPERCHOLESTEROLEMIA   . HYPERTENSION   . Iron deficiency anemia   . Nephrolithiasis   . NEPHROLITHIASIS, HX OF   . Paroxysmal atrial fibrillation ([facility])   . Presence of permanent cardiac pacemaker 12/12/2013  . Rib fracture   . Stable angina ([facility])   . Syncope   . Thyroid disease   . TRANSIENT ISCHEMIC ATTACKS, HX OF   . 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 Urinary frequency   . Vertigo   . Weakness of left leg     Past Surgical History:  Procedure Laterality Date  . AV FISTULA REPAIR    . BACK SURGERY  2000'S  . CARDIAC CATHETERIZATION  12/11/2013   Procedure: CORONARY STENT INTERVENTION;  Surgeon: Burnell Blanks, MD;  Location: [facility] CATH LAB;  Service: Cardiovascular;;  DES 3.5 x 15 Resolute to seq SVG of OM1/OM2   . CORONARY ARTERY  BYPASS GRAFT  1996  . CORONARY STENT PLACEMENT  12/11/13   SVG-OM DES  . FEMORAL ARTERY ANEURYSM REPAIR     feroral pseudo-aneurysm repair  . INSERT / REPLACE / REMOVE PACEMAKER    . LEFT HEART CATHETERIZATION WITH CORONARY/GRAFT ANGIOGRAM N/A 12/11/2013   Procedure: LEFT HEART CATHETERIZATION WITH Beatrix Fetters;  Surgeon: Burnell Blanks, MD;  Location: [facility] CATH LAB;  Service: Cardiovascular;  Laterality: N/A;  . PERMANENT PACEMAKER INSERTION N/A 12/12/2013   MDT ADDRL1 pacemaker implnated by Dr Rayann Heman  . PTCA  1980's     Allergies  Allergies  Allergen Reactions  . Clonidine Derivatives     unknown reaction   . Codeine     unknown reaction   . Meperidine Hcl     REACTION: Hypotension  . Motrin [Ibuprofen]     unknown reaction   . Other     Celery - unknown reaction  . Penicillins     unknown reaction   . Demerol Other (See Comments)    hypotension  . Heparin Other (See Comments)    hypotension  . Metformin And Related Diarrhea    History of Present Illness    Evan [name] is a 83 year old male with past medical history of CAD s/p CABG 5 vessel (1996), s/p DES to  SVG --> OM 1/2 (2015), diabetes, chronic systolic heart failure, PVD, hypothyroidism, complete heart block status post pacemaker, PAF and PAT on amiodarone. He is followed by Dr. admission and Dr. Rayann Heman in the outpatient setting. He was admitted in 2015 with syncope/bradycardia and chest pain, At that time showed  5/5 patent bypass grafts, but severe stenosis in the mid body of the SVG to OM, that was treated with PTCA/DES 1. Cath was followed by pacemaker placed by Dr. Tollie Eth device. He was admitted again 6/19-6/22/18 for hematochezia and chest pain. Hemoglobin dropped from 12-8. He was transfused rbc's, troponin peaked at 8.21. He was not started on heparin, and aspirin and Plavix were discontinued given his active bleeding. He was not felt to be a stable candidate for EGD given his  troponin rise. Troponin trended back down and hemoglobin remained stable. GI saw him and cleared him to restart Plavix once he was discharged home. Echo in 2015 showed EF of 45-50%, diffuse hypokinesis, grade 1 diastolic dysfunction.  He was recently seen in the clinic on 07/16/16 for follow-up, and reported feeling much better since discharge. No further blood noted in his stool, and minor chest pain. No shortness of breath, lower extremity edema or orthopnea. States he is able to walk about a half a mile a day without any issues.   Presented to the ED on 07/30/16 with several days of weakness and dyspnea worse on exertion. Also reported GI symptoms with nausea and diarrhea. Admission labs showed stable electrolytes, creatinine 1.8, BNP 1276, troponin 0.03>> 0.04, hemoglobin 9. Chest x-ray showed bilateral pulmonary edema with small pleural effusions. EKG V paced. He was started on IV Lasix, and admitted for further workup. Echo this admission shows severely reduced EF of 25-30% with diffuse hypokinesis and systolic dyssynchrony in the septal to lateral wall and apex to base.  Inpatient Medications    . amiodarone  100 mg Oral Daily  . amLODipine  5 mg Oral Daily  . atorvastatin  10 mg Oral Daily  . carvedilol  12.5 mg Oral BID WC  . clopidogrel  75 mg Oral Q breakfast  . finasteride  5 mg Oral Daily  . furosemide  80 mg Intravenous Q12H  . insulin aspart  0-5 Units Subcutaneous QHS  . insulin aspart  0-9 Units Subcutaneous TID WC  . isosorbide mononitrate  30 mg Oral Daily  . levothyroxine  112 mcg Oral QAC breakfast    Family History    Family History  Problem Relation Age of Onset  . Diabetes Mother   . Coronary artery disease Father     Social History    Social History   Social History  . Marital status: Widowed    Spouse name: N/A  . Number of children: N/A  . Years of education: N/A   Occupational History  . Not on file.   Social History Main Topics  . Smoking status:  Former Smoker    Quit date: 01/06/1956  . Smokeless tobacco: Never Used  . Alcohol use No  . Drug use: No  . Sexual activity: Not Currently   Other Topics Concern  . Not on file   Social History Narrative   Pt is married and lives with his wife.  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       Review of Systems    See history of present illness  All other systems reviewed and are otherwise negative  except as noted above.  Physical Exam    Blood pressure (!) 126/44, pulse (!) 59, temperature 98.4 F (36.9 C), temperature source Oral, resp. rate 18, height 5\' 7"  (1.702 m), weight 155 lb 6.4 oz (70.5 kg), SpO2 100 %.  General: Pleasant, older white male NAD Psych: Normal affect. Neuro: Alert and oriented X 3. Moves all extremities spontaneously. HEENT: Normal  Neck: Supple without bruits, mild JVD. Lungs:  Resp regular and unlabored, diminished in lower lobes. Heart: RRR no s3, s4,2/6 SEM @ RUSB-Carotids. Non-displaced PMI Abdomen: Soft, non-tender, non-distended, BS + x 4.  Extremities: No clubbing, cyanosis trace LE edema. DP/PT/Radials 2+ and equal bilaterally.  Labs    Troponin (Point of Care Test) No results for input(s): TROPIPOC in the last 72 hours.  Recent Labs  07/30/16 0852 07/30/16 1617  TROPONINI 0.03* 0.04*   Lab Results  Component Value Date   WBC 6.8 07/31/2016   HGB 8.6 (L) 07/31/2016   HCT 27.7 (L) 07/31/2016   MCV 76.1 (L) 07/31/2016   PLT 238 07/31/2016    Recent Labs Lab 07/30/16 0852 07/31/16 0340  NA 137 140  K 3.5 3.2*  CL 103 106  CO2 21* 23  BUN 23* 20  CREATININE 1.83* 1.55*  CALCIUM 8.6* 8.0*  PROT 6.2*  --   BILITOT 1.4*  --   ALKPHOS 65  --   ALT 13*  --   AST 16  --   GLUCOSE 207* 161*   No results found for: CHOL, HDL, LDLCALC, TRIG No results found for: [facility]   Radiology Studies    Dg Chest 2 View  Result Date: 07/31/2016 CLINICAL DATA:  Respiratory failure. EXAM:  CHEST  2 VIEW COMPARISON:  07/30/2016. FINDINGS: Cardiac pacer in stable position. Prior CABG. Cardiomegaly. Progressive bilateral pulmonary infiltrates consistent pulmonary edema. Tiny bilateral pleural effusions cannot be excluded. No pneumothorax . IMPRESSION: Cardiac pacer in stable position. Prior CABG. Congestive heart failure with continued progression of bilateral pulmonary edema. Tiny bilateral pleural effusions . Electronically Signed   By: Marcello Moores  Register   On: 07/31/2016 08:02   Dg Chest 2 View  Result Date: 07/30/2016 CLINICAL DATA:  Chronic shortness of breath.  Hypoxia. EXAM: CHEST  2 VIEW COMPARISON:  06/23/2016. FINDINGS: Cardiac pacer noted stable position. Prior CABG. Cardiomegaly again noted. Progressive bilateral pulmonary pulmonary interstitial infiltrates consistent pulmonary edema. Small bilateral pleural effusions. IMPRESSION: Cardiac pacer stable position. Prior CABG. Progressive changes of congestive heart failure with bilateral pulmonary interstitial edema and small pleural effusions . Electronically Signed   By: Marcello Moores  Register   On: 07/30/2016 10:52    ECG & Cardiac Imaging    EKG: Vpaced   Echo: 07/30/16  Study Conclusions  - Left ventricle: The cavity size was normal. Systolic function was   severely reduced. The estimated ejection fraction was in the   range of 25% to 30%. Severe diffuse hypokinesis with no   identifiable regional variations. There is substantial systolic   dyssynchrony, both septal-to-lateral wall and apex-to-base.   Doppler parameters are consistent with restrictive physiology,   indicative of decreased left ventricular diastolic compliance   and/or increased left atrial pressure. Acoustic contrast   opacification revealed no evidence ofthrombus. - Ventricular septum: Septal motion showed abnormal function,   dyssynergy, and paradox. These changes are consistent with right   ventricular pacing. - Aortic valve: Noncoronary cusp mobility  was severely restricted.   Transvalvular velocity was increased less than expected, due to   low cardiac output.  There was mild stenosis. There was mild   regurgitation directed centrally in the LVOT. Valve area (VTI):   1.41 cm^2. - Mitral valve: Calcified annulus. There was mild to moderate   regurgitation directed centrally. - Left atrium: The atrium was moderately dilated. - Right ventricle: Systolic function was mildly reduced. - Tricuspid valve: There was moderate regurgitation. - Pulmonary arteries: Systolic pressure was moderately increased.   PA peak pressure: 63 mm Hg (S).  Assessment & Plan    83 year old male with past medical history of CAD s/p CABG 5 vessel (1996), s/p DES to SVG --> OM 1/2 (2015), diabetes, chronic systolic heart failure, PVD, hypothyroidism, complete heart block status post pacemaker, PAF and PAT on amiodarone who presented with shortness of breath and chest pain.  1. Acute on chronic combined heart failure: Echo this admission shows worsening LV function from 45-50% and 2015-to 25-30%. BMP 1200 on admission, and chest x-ray with bilateral pulmonary edema noted. He has been started on IV Lasix with improvement, and good urine output. He is on good medical therapy with Coreg, Imdur, and Plavix. He denies any significant weight gain at home, but reports decreased oral intake. Suspect he has lost body mass, but gained fluid in its place. -- Would continue IV Lasix today, recheck BMET in the a.m. before further dosing. -- Will stop amlodipine, and Entresto -- daily weights -- Strict I&Os   2. CAD s/p 5v CABG: Denies any chest pain, troponin currently 0.04. Underwent previous cath in 2015 with 5/5 patent grafts, but DES placed to SVG to OM. Suspect worsening CAD, with new heart failure. At this time would not consider him a good candidate for further invasive procedures. Suggest medical therapy moving forward. -- On Coreg, Imdur, Plavix and statin.  3. Bilateral  pleural effusions: Noted on x-ray on admission, currently being reduced with IV Lasix. Follow-up chest x-ray shows improvement today. Would continue with Lasix as above.  4. CKD stage II: Baseline creatinine appears are round 1.5. Was initially elevated at 1.8 on admission but has improved. -- Follow BMET with diuresis and initiation of Entresto  5. Anemia: History of recent GI bleed, requiring transfusion of PRBCs. Baseline hemoglobin 9.5. Hemoglobin 9.0>> 8.6 today -- Follow CBC  6. Diabetes mellitus: SSI  7. HTN: Stable, recommendations as above.  Barnet Pall, NP-C Pager [phone] 07/31/2016, 1:54 PM   I have seen, examined and evaluated the patient this PM along with Reino Bellis, NP-C.  After reviewing all the available data and chart, we discussed the patients laboratory, study & physical findings as well as symptoms in detail. I agree with her findings, examination as well as impression recommendations as per our discussion.    Attending adjustments noted in italics.   Very pleasant elderly gentleman with a long-standing history of known coronary disease s/p CABG. Also has history of DPM He also has some mild aortic stenosis. Recently admitted with GI bleeding issues and had troponin elevation at that time. Last echo prior to this hospitalization was in 2015 at the time of cardiac catheterization with PCI to the SVG-OM1-OM 2. He was admitted with basically weakness and diarrhea and found to have an ejection fraction of 25-30% which is down from 40 and 45%. He has chronic stable angina that he has not noted any exacerbation in symptoms, he simply just notes that he has been very fatigued with exercise intolerance most notably from his waist down. He has not had any PND or orthopnea, but has had some  surgical dyspnea and edema.   With only trivial lower extremity edema and no rales, he doesn't seem that for volume overloaded.He comes in actually with diarrhea which  makes be reluctant to be aggressive with diuresis. . I don't think that he is actually in acute congestive heart failure more so simply a new diagnosis of cardiomyopathy which is most likely ischemic.  Standard course of action in a patient with mildly reduced ejection fraction who has known coronary disease would be to consider right and left heart catheterization to determine the true etiology. However since he really is not noticing any anginal symptoms, and we have no clear idea as to when this drop occurred (i.e. has not been evaluated since 2015), I am not sure that we would potentially find anything to do. Other potential etiology for reduced pump function could be pacemaker syndrome as this is also a new intervention since 2015.  EKG shows wide complex beats that are heavy appearance of V-paced beats  At this point, however I think is probably more effective to initiate appropriate therapy for cardiomyopathy with some mild heart failure symptoms, and then determine if further evaluation is warranted based on his symptoms. Given his age and general comorbidities, a question the benefit from invasive evaluation at this time.  If symptoms persist, and he is not getting better, then we can consider right & left heart catheterization.  Plan for now would be maybe diurese today with IV Lasix and hold off tomorrow morning until he can be reevaluated. We will start Entresto and discontinue amlodipine Continue beta blocker.  - Will interrogate PPM([employer]) to determine his pacing time - ? Pacemaker Syndrome  Cardiology will follow   Glenetta Hew, M.D., M.S. Interventional Cardiologist   Pager # [phone] Phone # [phone] [address] [place], [place] [address]

## 2016-07-31 NOTE — Progress Notes (Signed)
PROGRESS NOTE   Evan [name]  IWO:[id number]    DOB: [birth year]    DOA: 07/30/2016  PCP: Deland Pretty, MD   I have briefly reviewed patients previous medical records in [facility].  Brief Narrative:  83 year old male, lives at an independent living facility, ambulates with the help of a walker, not on home oxygen, PMH of CAD status post CABG, stent, DM 2, chronic systolic CHF, cardiomyopathy, complete heart block status post pacemaker, PAF on amiodarone, hypothyroid, HLD, HTN, iron deficiency anemia, GERD, chronic chest pain, presented with a several days history of progressively worsening dyspnea, orthopnea, no change in chronic chest pain, worsening lower extremity swelling but weight stable at 155 pounds. He was hospitalized 6/19-6/22 for acute lower GI bleed, EGD and colonoscopy were deferred due to high cardiac risk, ABLA, type 2 NSTEMI. He also reports a couple of episodes of diarrhea. Chest x-ray and clinical picture consistent with decompensated CHF. Echo shows worsening EF. Cardiology consulted.   Assessment & Plan:   Active Problems:   HYPERCHOLESTEROLEMIA   Essential hypertension   CABG '96   GERD   PAF (paroxysmal atrial fibrillation) ([place])   Pacemaker implanted 12/12/13 (MDT)   Complete heart block (HCC)   Acute on chronic systolic and diastolic heart failure, NYHA class 4 (HCC)   Stage 3 chronic renal impairment associated with type 2 diabetes mellitus (HCC)   CHF exacerbation (HCC)   Atypical chest pain   Acute respiratory failure with hypoxia (HCC)   Diarrhea   1. Acute on chronic combined CHF: Cardiology consultation appreciated. Echo this admission shows worsening of year from 45-50 percent in 2015 to 25-30 percent. As per cardiology, his symptoms likely are related more to worsening cardiomyopathy than severe CHF. They recommended continuing IV Lasix today and reassessing volume status in a.m. Amlodipine stopped. Entresto started. Continue carvedilol,  atorvastatin, Plavix and Imdur. -1.2 L since admission. Clinically improving. 2. Cardiomyopathy: Worsening EF as stated above. Likely ischemic cardiomyopathy. As per cardiology input, medical management for now. 3. CAD status post CABG, chronic stable angina: No change in his chest pain features. Medical management for now. Continue carvedilol, Imdur, Plavix and statin. 4. Paroxysmal A. fib: AV paced rhythm. Amiodarone. Not anticoagulation candidate due to GI bleed. 5. Bilateral pleural effusions: Likely due to CHF. Diuresis and follow chest x-ray periodically. 6. Diarrhea: Unclear etiology. C. difficile PCR negative. GI pathogen panel PCR negative. Improved. If has recurrent diarrhea, consider antimotility agents. 7. Anemia: Recent GI bleed that required blood transfusion. Hemoglobin probably in the low 9 g range. 8.6 today. No overt bleeding. Follow CBC in a.m. and transfuse if hemoglobin less than 8 g per DL. 8. Hypokalemia: Replace and follow. 9. Type II DM: Reasonable inpatient control. Continue SSI. 10. Essential hypertension: Controlled. 11. Stage III chronic kidney disease: Baseline creatinine probably in the 1.4 range. Current worsening may be related to poor perfusion from CHF and diuresis. Follow BMP closely. 12. Acute respiratory failure with hypoxia: Due to CHF and pleural effusions. Treat underlying cause and wean oxygen as tolerated.   DVT prophylaxis: SCDs Code Status: DO NOT RESUSCITATE Family Communication: None at bedside Disposition: DC home when medically stable   Consultants:  Cardiology   Procedures:  None  Antimicrobials:  None    Subjective: Seen this morning. Dyspnea somewhat improved. No current chest pain. Had 2 BMs all day yesterday and none since this morning.   ROS: Denies dizziness, lightheadedness or palpitations. No cough, fever or chills reported.  Objective:  Vitals:   07/31/16 0035 07/31/16 0315 07/31/16 0806 07/31/16 1230  BP: (!) 150/48  (!) 146/45  (!) 126/44  Pulse:  78  (!) 59  Resp:  18  18  Temp: 97.9 F (36.6 C) 98.2 F (36.8 C)  98.4 F (36.9 C)  TempSrc: Oral Oral  Oral  SpO2:  95% 94% 100%  Weight:      Height:        Examination:  General exam: Pleasant elderly male, moderately built and frail, chronically ill-looking, sitting up comfortably in chair this morning. Respiratory system: Clear anteriorly. Crackles up to the mid back posteriorly. No wheezing or rhonchi. Respiratory effort normal. Cardiovascular system: S1 & S2 heard, RRR. No JVD, murmurs, rubs, gallops or clicks. 1+ bilateral ankle edema. Telemetry: AV paced rhythm. Gastrointestinal system: Abdomen is nondistended, soft and nontender. No organomegaly or masses felt. Normal bowel sounds heard. Central nervous system: Alert and oriented. No focal neurological deficits. Extremities: Symmetric 5 x 5 power. Skin: No rashes, lesions or ulcers Psychiatry: Judgement and insight appear normal. Mood & affect appropriate.     Data Reviewed: I have personally reviewed following labs and imaging studies  CBC:  Recent Labs Lab 07/30/16 0852 07/31/16 0340  WBC 6.5 6.8  NEUTROABS 5.6  --   HGB 9.0* 8.6*  HCT 28.9* 27.7*  MCV 76.9* 76.1*  PLT 216 408   Basic Metabolic Panel:  Recent Labs Lab 07/30/16 0852 07/30/16 1617 07/31/16 0340  NA 137  --  140  K 3.5  --  3.2*  CL 103  --  106  CO2 21*  --  23  GLUCOSE 207*  --  161*  BUN 23*  --  20  CREATININE 1.83*  --  1.55*  CALCIUM 8.6*  --  8.0*  MG  --  2.1  --   PHOS  --  3.7  --    Liver Function Tests:  Recent Labs Lab 07/30/16 0852  AST 16  ALT 13*  ALKPHOS 65  BILITOT 1.4*  PROT 6.2*  ALBUMIN 3.5   Coagulation Profile: No results for input(s): INR, PROTIME in the last 168 hours. Cardiac Enzymes:  Recent Labs Lab 07/30/16 0852 07/30/16 1617  TROPONINI 0.03* 0.04*   HbA1C: No results for input(s): HGBA1C in the last 72 hours. CBG:  Recent Labs Lab  07/30/16 1719 07/30/16 2102 07/31/16 0805 07/31/16 1210 07/31/16 1713  GLUCAP 162* 178* 172* 183* 183*    Recent Results (from the past 240 hour(s))  Gastrointestinal Panel by PCR , Stool     Status: None   Collection Time: 07/30/16  1:35 PM  Result Value Ref Range Status   Campylobacter species NOT DETECTED NOT DETECTED Final   Plesimonas shigelloides NOT DETECTED NOT DETECTED Final   Salmonella species NOT DETECTED NOT DETECTED Final   Yersinia enterocolitica NOT DETECTED NOT DETECTED Final   Vibrio species NOT DETECTED NOT DETECTED Final   Vibrio cholerae NOT DETECTED NOT DETECTED Final   Enteroaggregative E coli (EAEC) NOT DETECTED NOT DETECTED Final   Enteropathogenic E coli (EPEC) NOT DETECTED NOT DETECTED Final   Enterotoxigenic E coli (ETEC) NOT DETECTED NOT DETECTED Final   Shiga like toxin producing E coli (STEC) NOT DETECTED NOT DETECTED Final   Shigella/Enteroinvasive E coli (EIEC) NOT DETECTED NOT DETECTED Final   Cryptosporidium NOT DETECTED NOT DETECTED Final   Cyclospora cayetanensis NOT DETECTED NOT DETECTED Final   Entamoeba histolytica NOT DETECTED NOT DETECTED Final   Giardia lamblia NOT DETECTED  NOT DETECTED Final   Adenovirus F40/41 NOT DETECTED NOT DETECTED Final   Astrovirus NOT DETECTED NOT DETECTED Final   Norovirus GI/GII NOT DETECTED NOT DETECTED Final   Rotavirus A NOT DETECTED NOT DETECTED Final   Sapovirus (I, II, IV, and V) NOT DETECTED NOT DETECTED Final  C difficile quick scan w PCR reflex     Status: Abnormal   Collection Time: 07/30/16  1:35 PM  Result Value Ref Range Status   C Diff antigen POSITIVE (A) NEGATIVE Final   C Diff toxin NEGATIVE NEGATIVE Final   C Diff interpretation Results are indeterminate. See PCR results.  Final  Clostridium Difficile by PCR     Status: None   Collection Time: 07/30/16  1:35 PM  Result Value Ref Range Status   Toxigenic C Difficile by pcr NEGATIVE NEGATIVE Final    Comment: Patient is colonized with non  toxigenic C. difficile. May not need treatment unless significant symptoms are present.         Radiology Studies: Dg Chest 2 View  Result Date: 07/31/2016 CLINICAL DATA:  Respiratory failure. EXAM: CHEST  2 VIEW COMPARISON:  07/30/2016. FINDINGS: Cardiac pacer in stable position. Prior CABG. Cardiomegaly. Progressive bilateral pulmonary infiltrates consistent pulmonary edema. Tiny bilateral pleural effusions cannot be excluded. No pneumothorax . IMPRESSION: Cardiac pacer in stable position. Prior CABG. Congestive heart failure with continued progression of bilateral pulmonary edema. Tiny bilateral pleural effusions . Electronically Signed   By: Marcello Moores  Register   On: 07/31/2016 08:02   Dg Chest 2 View  Result Date: 07/30/2016 CLINICAL DATA:  Chronic shortness of breath.  Hypoxia. EXAM: CHEST  2 VIEW COMPARISON:  06/23/2016. FINDINGS: Cardiac pacer noted stable position. Prior CABG. Cardiomegaly again noted. Progressive bilateral pulmonary pulmonary interstitial infiltrates consistent pulmonary edema. Small bilateral pleural effusions. IMPRESSION: Cardiac pacer stable position. Prior CABG. Progressive changes of congestive heart failure with bilateral pulmonary interstitial edema and small pleural effusions . Electronically Signed   By: Marcello Moores  Register   On: 07/30/2016 10:52        Scheduled Meds: . amiodarone  100 mg Oral Daily  . atorvastatin  10 mg Oral Daily  . carvedilol  12.5 mg Oral BID WC  . clopidogrel  75 mg Oral Q breakfast  . finasteride  5 mg Oral Daily  . furosemide  80 mg Intravenous Q12H  . insulin aspart  0-5 Units Subcutaneous QHS  . insulin aspart  0-9 Units Subcutaneous TID WC  . isosorbide mononitrate  30 mg Oral Daily  . levothyroxine  112 mcg Oral QAC breakfast  . [START ON 08/01/2016] sacubitril-valsartan  1 tablet Oral BID   Continuous Infusions: . sodium chloride 50 mL/hr at 07/30/16 1743     LOS: 0 days     Kaylena Pacifico, MD, FACP, FHM. [facility] Pager [phone]  If 7PM-7AM, please contact night-coverage [email or web address] Password [facility] 07/31/2016, 6:13 PM

## 2016-08-01 LAB — PROCALCITONIN: PROCALCITONIN: 0.11 ng/mL

## 2016-08-01 LAB — GLUCOSE, CAPILLARY
GLUCOSE-CAPILLARY: 149 mg/dL — AB (ref 65–99)
GLUCOSE-CAPILLARY: 216 mg/dL — AB (ref 65–99)
Glucose-Capillary: 204 mg/dL — ABNORMAL HIGH (ref 65–99)
Glucose-Capillary: 190 mg/dL — ABNORMAL HIGH (ref 65–99)

## 2016-08-01 LAB — BASIC METABOLIC PANEL
ANION GAP: 11 (ref 5–15)
BUN: 22 mg/dL — ABNORMAL HIGH (ref 6–20)
CHLORIDE: 104 mmol/L (ref 101–111)
CO2: 25 mmol/L (ref 22–32)
Calcium: 8.1 mg/dL — ABNORMAL LOW (ref 8.9–10.3)
Creatinine, Ser: 1.65 mg/dL — ABNORMAL HIGH (ref 0.61–1.24)
GFR calc Af Amer: 41 mL/min — ABNORMAL LOW (ref >60)
GFR, EST NON AFRICAN AMERICAN: 35 mL/min — AB (ref >60)
GLUCOSE: 131 mg/dL — AB (ref 65–99)
POTASSIUM: 3.4 mmol/L — AB (ref 3.5–5.1)
Sodium: 140 mmol/L (ref 135–145)

## 2016-08-01 LAB — CBC
HCT: 27.8 % — ABNORMAL LOW (ref 39.0–52.0)
HEMOGLOBIN: 8.2 g/dL — AB (ref 13.0–17.0)
MCH: 22.7 pg — ABNORMAL LOW (ref 26.0–34.0)
MCHC: 29.5 g/dL — ABNORMAL LOW (ref 30.0–36.0)
MCV: 77 fL — AB (ref 78.0–100.0)
PLATELETS: 244 10*3/uL (ref 150–400)
RBC: 3.61 MIL/uL — ABNORMAL LOW (ref 4.22–5.81)
RDW: 17.1 % — ABNORMAL HIGH (ref 11.5–15.5)
WBC: 6.8 10*3/uL (ref 4.0–10.5)

## 2016-08-01 MED ORDER — POTASSIUM CHLORIDE CRYS ER 20 MEQ PO TBCR
40.0000 meq | EXTENDED_RELEASE_TABLET | Freq: Once | ORAL | Status: AC
  Administered 2016-08-01: 40 meq via ORAL
  Filled 2016-08-01: qty 2

## 2016-08-01 MED ORDER — FUROSEMIDE 10 MG/ML IJ SOLN
40.0000 mg | Freq: Two times a day (BID) | INTRAMUSCULAR | Status: AC
  Administered 2016-08-01 – 2016-08-02 (×2): 40 mg via INTRAVENOUS
  Filled 2016-08-01 (×2): qty 4

## 2016-08-01 NOTE — Progress Notes (Addendum)
PROGRESS NOTE   Evan [name]  SHF:[id number]    DOB: March 14, 1923    DOA: 07/30/2016  PCP: Deland Pretty, MD   I have briefly reviewed patients previous medical records in [facility].  Brief Narrative:  83 year old male, lives at an independent living facility, ambulates with the help of a walker, not on home oxygen, PMH of CAD status post CABG, stent, DM 2, chronic systolic CHF, cardiomyopathy, complete heart block status post pacemaker, PAF on amiodarone, hypothyroid, HLD, HTN, iron deficiency anemia, GERD, chronic chest pain, presented with a several days history of progressively worsening dyspnea, orthopnea, no change in chronic chest pain, worsening lower extremity swelling but weight stable at 155 pounds. He was hospitalized 6/19-6/22 for acute lower GI bleed, EGD and colonoscopy were deferred due to high cardiac risk, ABLA, type 2 NSTEMI. He also reports a couple of episodes of diarrhea. Chest x-ray and clinical picture consistent with decompensated CHF. Echo shows worsening EF. Cardiology consulted.   Assessment & Plan:   Principal Problem:   Acute on chronic systolic and diastolic heart failure, NYHA class 4 (HCC) Active Problems:   HYPERCHOLESTEROLEMIA   Essential hypertension   CABG '96   GERD   PAF (paroxysmal atrial fibrillation) ([place])   Pacemaker implanted 12/12/13 (MDT)   Complete heart block (HCC)   Stage 3 chronic renal impairment associated with type 2 diabetes mellitus ([place])   Atypical chest pain   Acute respiratory failure with hypoxia ([place])   1. Acute on chronic combined CHF: Cardiology consultation appreciated. Echo this admission shows worsening of EF from 45-50 percent in 2015 to 25-30 percent. As per cardiology, his symptoms likely are related more to worsening cardiomyopathy than severe CHF. They recommended continuing IV Lasix yesterday and reassessing volume status today. Amlodipine stopped. Entresto started. Continue carvedilol, atorvastatin, Plavix and  Imdur. -1.6 L & -5 pounds since admission. Pro-calcitonin very low, low index of suspicion for pneumonia. Clinically improved. May consider oral Lasix, defer to Cardiology follow-up today-pending. Follow-up chest x-ray in a.m. 2. Cardiomyopathy: Worsening EF as stated above. Likely ischemic cardiomyopathy. As per cardiology input, medical management for now. 3. CAD status post CABG, chronic stable angina: No change in his chest pain features. Medical management for now. Continue carvedilol, Imdur, Plavix and statin. No chest pain since admission. 4. Paroxysmal A. fib: AV paced rhythm. Amiodarone. Not anticoagulation candidate due to GI bleed. Stable. 5. Bilateral pleural effusions: Likely due to CHF. Diuresis and follow chest x-ray periodically. 6. Diarrhea: Unclear etiology. C. difficile antigen positive, toxin negative, PCR negative. GI pathogen panel PCR negative. Resolved.  7. Microcytic Anemia/iron deficiency: Recent GI bleed that required blood transfusion. Hemoglobin probably in the low 9 g range. No overt bleeding. No melena reported. Hemoglobin has gradually drifted down to 8.2. Follow CBC in a.m. and transfuse if hemoglobin less than 78 g per DL. Anemia panel: Iron 21, ferritin 29, folate 11.7 and B12: 542. Iron supplements. 8. Hypokalemia: Continue to replace and follow. Magnesium 2.1. 9. Type II DM: Reasonable inpatient control. Continue SSI. Stable. 10. Essential hypertension: Controlled. No change. 11. Stage III chronic kidney disease: Baseline creatinine probably in the 1.4 range. Current worsening may be related to poor perfusion from CHF and diuresis. Follow BMP closely. 12. Acute respiratory failure with hypoxia: Due to CHF and pleural effusions. Treat underlying cause and wean oxygen as tolerated. Currently comfortable on room air. Reassess with ambulation.   DVT prophylaxis: SCDs Code Status: DO NOT RESUSCITATE Family Communication: None at bedside Disposition:  DC home when  medically stable   Consultants:  Cardiology   Procedures:  2-D echo 07/30/16: Study Conclusions  - Left ventricle: The cavity size was normal. Systolic function was   severely reduced. The estimated ejection fraction was in the   range of 25% to 30%. Severe diffuse hypokinesis with no   identifiable regional variations. There is substantial systolic   dyssynchrony, both septal-to-lateral wall and apex-to-base.   Doppler parameters are consistent with restrictive physiology,   indicative of decreased left ventricular diastolic compliance   and/or increased left atrial pressure. Acoustic contrast   opacification revealed no evidence ofthrombus. - Ventricular septum: Septal motion showed abnormal function,   dyssynergy, and paradox. These changes are consistent with right   ventricular pacing. - Aortic valve: Noncoronary cusp mobility was severely restricted.   Transvalvular velocity was increased less than expected, due to   low cardiac output. There was mild stenosis. There was mild   regurgitation directed centrally in the LVOT. Valve area (VTI):   1.41 cm^2. - Mitral valve: Calcified annulus. There was mild to moderate   regurgitation directed centrally. - Left atrium: The atrium was moderately dilated. - Right ventricle: Systolic function was mildly reduced. - Tricuspid valve: There was moderate regurgitation. - Pulmonary arteries: Systolic pressure was moderately increased.   PA peak pressure: 63 mm Hg (S).  Antimicrobials:  None    Subjective: No further diarrhea. Stated that he had formed dark brown color stools this morning without blood or black stools. Denies dyspnea at rest but states that he does not know how he will do with activity but since he has not ambulated in the hospital. No chest pain reported. Cough, mildly wet but nonproductive.  ROS: Denies dizziness, lightheadedness or palpitations. No cough, fever or chills reported. No  change.  Objective:  Vitals:   07/31/16 2058 08/01/16 0328 08/01/16 0451 08/01/16 0842  BP:   (!) 120/42 (!) 125/37  Pulse: 62  60 (!) 58  Resp: 20  18 18   Temp:   98.1 F (36.7 C) 97.6 F (36.4 C)  TempSrc:   Oral Oral  SpO2: 98% 97% 97% 96%  Weight:   68.9 kg (151 lb 12.8 oz)   Height:        Examination:  General exam: Pleasant elderly male, moderately built and frail, chronically ill-looking, sitting up comfortably in chair this morning.Currently on room air. Extremely hard of hearing. Uses right hearing aid. Respiratory system: Improved breath sounds. Clear anteriorly. Bibasal crackles but much improved compared to yesterday. No wheezing or rhonchi. No increased work of breathing. Able to speak in full sentences. Cardiovascular system: S1 & S2 heard, RRR. No JVD, murmurs, rubs, gallops or clicks. No pedal edema. Telemetry: AV paced rhythm. Gastrointestinal system: Abdomen is nondistended, soft and nontender. No organomegaly or masses felt. Normal bowel sounds heard. Stable. Central nervous system: Alert and oriented. No focal neurological deficits. Stable. Extremities: Symmetric 5 x 5 power. Skin: No rashes, lesions or ulcers Psychiatry: Judgement and insight appear normal. Mood & affect appropriate.     Data Reviewed: I have personally reviewed following labs and imaging studies  CBC:  Recent Labs Lab 07/30/16 0852 07/31/16 0340 08/01/16 0547  WBC 6.5 6.8 6.8  NEUTROABS 5.6  --   --   HGB 9.0* 8.6* 8.2*  HCT 28.9* 27.7* 27.8*  MCV 76.9* 76.1* 77.0*  PLT 216 238 175   Basic Metabolic Panel:  Recent Labs Lab 07/30/16 0852 07/30/16 1617 07/31/16 0340 08/01/16 0547  NA 137  --  140 140  K 3.5  --  3.2* 3.4*  CL 103  --  106 104  CO2 21*  --  23 25  GLUCOSE 207*  --  161* 131*  BUN 23*  --  20 22*  CREATININE 1.83*  --  1.55* 1.65*  CALCIUM 8.6*  --  8.0* 8.1*  MG  --  2.1  --   --   PHOS  --  3.7  --   --    Liver Function Tests:  Recent Labs Lab  07/30/16 0852  AST 16  ALT 13*  ALKPHOS 65  BILITOT 1.4*  PROT 6.2*  ALBUMIN 3.5   Coagulation Profile: No results for input(s): INR, PROTIME in the last 168 hours. Cardiac Enzymes:  Recent Labs Lab 07/30/16 0852 07/30/16 1617  TROPONINI 0.03* 0.04*   HbA1C: No results for input(s): HGBA1C in the last 72 hours. CBG:  Recent Labs Lab 07/31/16 0805 07/31/16 1210 07/31/16 1713 07/31/16 2135 08/01/16 0727  GLUCAP 172* 183* 183* 139* 149*    Recent Results (from the past 240 hour(s))  Gastrointestinal Panel by PCR , Stool     Status: None   Collection Time: 07/30/16  1:35 PM  Result Value Ref Range Status   Campylobacter species NOT DETECTED NOT DETECTED Final   Plesimonas shigelloides NOT DETECTED NOT DETECTED Final   Salmonella species NOT DETECTED NOT DETECTED Final   Yersinia enterocolitica NOT DETECTED NOT DETECTED Final   Vibrio species NOT DETECTED NOT DETECTED Final   Vibrio cholerae NOT DETECTED NOT DETECTED Final   Enteroaggregative E coli (EAEC) NOT DETECTED NOT DETECTED Final   Enteropathogenic E coli (EPEC) NOT DETECTED NOT DETECTED Final   Enterotoxigenic E coli (ETEC) NOT DETECTED NOT DETECTED Final   Shiga like toxin producing E coli (STEC) NOT DETECTED NOT DETECTED Final   Shigella/Enteroinvasive E coli (EIEC) NOT DETECTED NOT DETECTED Final   Cryptosporidium NOT DETECTED NOT DETECTED Final   Cyclospora cayetanensis NOT DETECTED NOT DETECTED Final   Entamoeba histolytica NOT DETECTED NOT DETECTED Final   Giardia lamblia NOT DETECTED NOT DETECTED Final   Adenovirus F40/41 NOT DETECTED NOT DETECTED Final   Astrovirus NOT DETECTED NOT DETECTED Final   Norovirus GI/GII NOT DETECTED NOT DETECTED Final   Rotavirus A NOT DETECTED NOT DETECTED Final   Sapovirus (I, II, IV, and V) NOT DETECTED NOT DETECTED Final  C difficile quick scan w PCR reflex     Status: Abnormal   Collection Time: 07/30/16  1:35 PM  Result Value Ref Range Status   C Diff antigen  POSITIVE (A) NEGATIVE Final   C Diff toxin NEGATIVE NEGATIVE Final   C Diff interpretation Results are indeterminate. See PCR results.  Final  Clostridium Difficile by PCR     Status: None   Collection Time: 07/30/16  1:35 PM  Result Value Ref Range Status   Toxigenic C Difficile by pcr NEGATIVE NEGATIVE Final    Comment: Patient is colonized with non toxigenic C. difficile. May not need treatment unless significant symptoms are present.         Radiology Studies: Dg Chest 2 View  Result Date: 07/31/2016 CLINICAL DATA:  Respiratory failure. EXAM: CHEST  2 VIEW COMPARISON:  07/30/2016. FINDINGS: Cardiac pacer in stable position. Prior CABG. Cardiomegaly. Progressive bilateral pulmonary infiltrates consistent pulmonary edema. Tiny bilateral pleural effusions cannot be excluded. No pneumothorax . IMPRESSION: Cardiac pacer in stable position. Prior CABG. Congestive heart failure with continued progression of  bilateral pulmonary edema. Tiny bilateral pleural effusions . Electronically Signed   By: Marcello Moores  Register   On: 07/31/2016 08:02        Scheduled Meds: . amiodarone  100 mg Oral Daily  . atorvastatin  10 mg Oral Daily  . carvedilol  12.5 mg Oral BID WC  . clopidogrel  75 mg Oral Q breakfast  . finasteride  5 mg Oral Daily  . insulin aspart  0-5 Units Subcutaneous QHS  . insulin aspart  0-9 Units Subcutaneous TID WC  . isosorbide mononitrate  30 mg Oral Daily  . levothyroxine  112 mcg Oral QAC breakfast  . sacubitril-valsartan  1 tablet Oral BID   Continuous Infusions:    LOS: 1 day     Maecie Sevcik, MD, FACP, FHM. [facility] Pager [phone]  If 7PM-7AM, please contact night-coverage [email or web address] Password [email or web address] 08/01/2016, 11:06 AM

## 2016-08-01 NOTE — Progress Notes (Signed)
Progress Note  Patient Name: Evan [name] Date of Encounter: 08/01/2016  Primary Cardiologist:   Nishan/Allred   Subjective   Breathing some better   NO CP   Inpatient Medications    Scheduled Meds: . amiodarone  100 mg Oral Daily  . atorvastatin  10 mg Oral Daily  . carvedilol  12.5 mg Oral BID WC  . clopidogrel  75 mg Oral Q breakfast  . finasteride  5 mg Oral Daily  . insulin aspart  0-5 Units Subcutaneous QHS  . insulin aspart  0-9 Units Subcutaneous TID WC  . isosorbide mononitrate  30 mg Oral Daily  . levothyroxine  112 mcg Oral QAC breakfast  . sacubitril-valsartan  1 tablet Oral BID   Continuous Infusions:  PRN Meds: acetaminophen **OR** acetaminophen, albuterol, hydrALAZINE, nitroGLYCERIN, ondansetron **OR** ondansetron (ZOFRAN) IV   Vital Signs    Vitals:   07/31/16 2058 08/01/16 0328 08/01/16 0451 08/01/16 0842  BP:   (!) 120/42 (!) 125/37  Pulse: 62  60 (!) 58  Resp: 20  18 18   Temp:   98.1 F (36.7 C) 97.6 F (36.4 C)  TempSrc:   Oral Oral  SpO2: 98% 97% 97% 96%  Weight:   151 lb 12.8 oz (68.9 kg)   Height:        Intake/Output Summary (Last 24 hours) at 08/01/16 1214 Last data filed at 08/01/16 0847  Gross per 24 hour  Intake              600 ml  Output              701 ml  Net             -101 ml   Filed Weights   07/30/16 0847 07/30/16 1434 08/01/16 0451  Weight: 156 lb (70.8 kg) 155 lb 6.4 oz (70.5 kg) 151 lb 12.8 oz (68.9 kg)    Telemetry  Paced   Personally Reviewed  ECG      Physical Exam   GEN: No acute distress.   Neck: No JVD Cardiac: RRR, no murmurs, rubs, or gallops.  Respiratory: Rales at R base   GI: Soft, nontender, non-distended  MS: 1+ edema; No deformity. Neuro:  Nonfocal  Psych: Normal affect   Labs    Chemistry Recent Labs Lab 07/30/16 0852 07/31/16 0340 08/01/16 0547  NA 137 140 140  K 3.5 3.2* 3.4*  CL 103 106 104  CO2 21* 23 25  GLUCOSE 207* 161* 131*  BUN 23* 20 22*  CREATININE 1.83*  1.55* 1.65*  CALCIUM 8.6* 8.0* 8.1*  PROT 6.2*  --   --   ALBUMIN 3.5  --   --   AST 16  --   --   ALT 13*  --   --   ALKPHOS 65  --   --   BILITOT 1.4*  --   --   GFRNONAA 31* 38* 35*  GFRAA 36* 44* 41*  ANIONGAP 13 11 11      Hematology Recent Labs Lab 07/30/16 0852 07/30/16 1617 07/31/16 0340 08/01/16 0547  WBC 6.5  --  6.8 6.8  RBC 3.76* 4.03* 3.64* 3.61*  HGB 9.0*  --  8.6* 8.2*  HCT 28.9*  --  27.7* 27.8*  MCV 76.9*  --  76.1* 77.0*  MCH 23.9*  --  23.6* 22.7*  MCHC 31.1  --  31.0 29.5*  RDW 17.3*  --  16.9* 17.1*  PLT 216  --  238 244  Cardiac Enzymes Recent Labs Lab 07/30/16 0852 07/30/16 1617  TROPONINI 0.03* 0.04*   No results for input(s): TROPIPOC in the last 168 hours.   BNP Recent Labs Lab 07/30/16 0852  BNP 1,276.9*     DDimer No results for input(s): DDIMER in the last 168 hours.   Radiology    Dg Chest 2 View  Result Date: 07/31/2016 CLINICAL DATA:  Respiratory failure. EXAM: CHEST  2 VIEW COMPARISON:  07/30/2016. FINDINGS: Cardiac pacer in stable position. Prior CABG. Cardiomegaly. Progressive bilateral pulmonary infiltrates consistent pulmonary edema. Tiny bilateral pleural effusions cannot be excluded. No pneumothorax . IMPRESSION: Cardiac pacer in stable position. Prior CABG. Congestive heart failure with continued progression of bilateral pulmonary edema. Tiny bilateral pleural effusions . Electronically Signed   By: Marcello Moores  Register   On: 07/31/2016 08:02    Cardiac Studies     Patient Profile       Assessment & Plan   Acute on chronic systolic CHF   Pt continues to diurese   Still with some volume increase on exam  Cant lay flat in bed    2  CAD  S/p CABG  Medial Rx    3  CKD  Improving      Signed, Dorris Carnes, MD  08/01/2016, 12:14 PM

## 2016-08-02 ENCOUNTER — Inpatient Hospital Stay (HOSPITAL_COMMUNITY): Payer: Medicare Other

## 2016-08-02 LAB — GLUCOSE, CAPILLARY
GLUCOSE-CAPILLARY: 181 mg/dL — AB (ref 65–99)
GLUCOSE-CAPILLARY: 143 mg/dL — AB (ref 65–99)
Glucose-Capillary: 162 mg/dL — ABNORMAL HIGH (ref 65–99)
Glucose-Capillary: 249 mg/dL — ABNORMAL HIGH (ref 65–99)

## 2016-08-02 LAB — CBC
HEMATOCRIT: 29.2 % — AB (ref 39.0–52.0)
Hemoglobin: 8.8 g/dL — ABNORMAL LOW (ref 13.0–17.0)
MCH: 23.1 pg — AB (ref 26.0–34.0)
MCHC: 30.1 g/dL (ref 30.0–36.0)
MCV: 76.6 fL — AB (ref 78.0–100.0)
Platelets: 215 10*3/uL (ref 150–400)
RBC: 3.81 MIL/uL — ABNORMAL LOW (ref 4.22–5.81)
RDW: 17.7 % — AB (ref 11.5–15.5)
WBC: 6.1 10*3/uL (ref 4.0–10.5)

## 2016-08-02 LAB — BASIC METABOLIC PANEL
ANION GAP: 12 (ref 5–15)
BUN: 27 mg/dL — ABNORMAL HIGH (ref 6–20)
CALCIUM: 8.4 mg/dL — AB (ref 8.9–10.3)
CO2: 21 mmol/L — AB (ref 22–32)
Chloride: 105 mmol/L (ref 101–111)
Creatinine, Ser: 1.58 mg/dL — ABNORMAL HIGH (ref 0.61–1.24)
GFR calc Af Amer: 43 mL/min — ABNORMAL LOW (ref >60)
GFR calc non Af Amer: 37 mL/min — ABNORMAL LOW (ref >60)
GLUCOSE: 140 mg/dL — AB (ref 65–99)
Potassium: 4 mmol/L (ref 3.5–5.1)
Sodium: 138 mmol/L (ref 135–145)

## 2016-08-02 MED ORDER — LOPERAMIDE HCL 2 MG PO CAPS: 2.0000 mg | ORAL_CAPSULE | Freq: Two times a day (BID) | ORAL | Status: DC | PRN

## 2016-08-02 MED ORDER — FUROSEMIDE 10 MG/ML IJ SOLN
80.0000 mg | Freq: Two times a day (BID) | INTRAMUSCULAR | Status: DC
  Administered 2016-08-02: 80 mg via INTRAVENOUS
  Filled 2016-08-02: qty 8

## 2016-08-02 MED ORDER — SACCHAROMYCES BOULARDII 250 MG PO CAPS
250.0000 mg | ORAL_CAPSULE | Freq: Two times a day (BID) | ORAL | Status: DC
  Administered 2016-08-02 – 2016-08-05 (×7): 250 mg via ORAL
  Filled 2016-08-02 (×7): qty 1

## 2016-08-02 MED ORDER — FUROSEMIDE 10 MG/ML IJ SOLN: 80.0000 mg | Freq: Once | INTRAMUSCULAR | Status: DC

## 2016-08-02 NOTE — Evaluation (Signed)
Physical Therapy Evaluation Patient Details Name: Evan [name] MRN: [id number] DOB: 15-Mar-1923 Today's Date: 08/02/2016   History of Present Illness  Pt is a 83 y.o male admitted through ED on 07/30/16 with worsening dyspnea, orthopnea, chronic chest pain, and LE swelling. Pt was recently hospitalized 6/10-6/22 with acute lower GI bleed. Pt was diagnosed with acute on chronic CHF, Bilateral pleural effusions, DM2, CKD III, ARF with hypoxia, and HTN. PMH significant for CAD s/p CABG, stent, Dm2, chronic systolic CHF, cardiomyopathy, complete heart block s/p pacemaker, PAF, hypothyroid, HLD, HTN, anemia, GERD, and chronic chest pain.   Clinical Impression  Pt presents with the above diagnosis and below deficits for therapy evaluation. Prior to admission, pt was living in a 3rd floor apartment of an ILF where he receives meals. Pt reports worsening ability to get down to meals prior to admission. Pt requires Min guard for a majority of mobility with very limited distance. Pt will need to perform at least [phone]' of gait in order to safely get to and from meals at his ILF. Pt is fluctuating between going to SNF for rehab or hiring assistance in his home. He was receiving Home health services through [facility] prior to admission. Pt will benefit from continued PT follow-up in order to address the below deficits prior to DC.     Follow Up Recommendations SNF    Equipment Recommendations  None recommended by PT    Recommendations for Other Services       Precautions / Restrictions Precautions Precautions: Fall Restrictions Weight Bearing Restrictions: No      Mobility  Bed Mobility               General bed mobility comments: pt sitting up in recliner when PT arrives  Transfers Overall transfer level: Needs assistance Equipment used: Rolling walker (2 wheeled) Transfers: Sit to/from Stand Sit to Stand: Min guard         General transfer comment: Min gaurd for safety from  recliner to RW.   Ambulation/Gait Ambulation/Gait assistance: Min guard Ambulation Distance (Feet): 40 Feet Assistive device: Rolling walker (2 wheeled) Gait Pattern/deviations: Step-through pattern;Decreased stride length;Shuffle Gait velocity: decreased Gait velocity interpretation: <1.8 ft/sec, indicative of risk for recurrent falls General Gait Details: Min guard for safety with cues for proximity to RW. Pt unable to progress further than to door of room and back. Slow cadence, decreased step length and minimal shuffel noted. O2 93% following gait.    Stairs            Wheelchair Mobility    Modified Rankin (Stroke Patients Only)       Balance Overall balance assessment: Needs assistance Sitting-balance support: No upper extremity supported;Feet supported Sitting balance-Leahy Scale: Normal     Standing balance support: Bilateral upper extremity supported;No upper extremity supported Standing balance-Leahy Scale: Fair Standing balance comment: able to stand without UE for therapist to [facility] gown                             Pertinent Vitals/Pain Pain Assessment: No/denies pain    Home Living Family/patient expects to be discharged to:: Private residence ([place]) Living Arrangements: Alone Available Help at Discharge: Other (Comment) (no real consistent assistance) Type of Home: Independent living facility Home Access: [place]: One level Home Equipment: Walker - 4 wheels;Shower seat - built in;Grab bars - toilet;Grab bars - tub/shower;Hand held shower head  Prior Function Level of Independence: Independent with assistive device(s)         Comments: pt doesn't use assistive device around apartment but does use walker for ambulating in facility, ambulates to dining hall     Hand Dominance   Dominant Hand: Right    Extremity/Trunk Assessment   Upper Extremity Assessment Upper Extremity Assessment: Defer to  OT evaluation    Lower Extremity Assessment Lower Extremity Assessment: Generalized weakness    Cervical / Trunk Assessment Cervical / Trunk Assessment: Kyphotic  Communication   Communication: No difficulties;HOH  Cognition Arousal/Alertness: Awake/alert Behavior During Therapy: WFL for tasks assessed/performed Overall Cognitive Status: Within Functional Limits for tasks assessed                                        General Comments General comments (skin integrity, edema, etc.): Pt lived in [place] and is considering either hiring help or going to SNF for rehab.     Exercises     Assessment/Plan    PT Assessment Patient needs continued PT services  PT Problem List Decreased strength;Decreased activity tolerance;Decreased balance;Decreased mobility;Decreased knowledge of use of DME;Cardiopulmonary status limiting activity       PT Treatment Interventions DME instruction;Gait training;Functional mobility training;Therapeutic activities;Therapeutic exercise;Balance training;Patient/family education    PT Goals (Current goals can be found in the Care Plan section)  Acute Rehab PT Goals Patient Stated Goal: to either have help at home or go SNF PT Goal Formulation: With patient Time For Goal Achievement: 08/09/16 Potential to Achieve Goals: Good    Frequency Min 2X/week   Barriers to discharge Decreased caregiver support family lives in [place] PT "6 Clicks" Daily Activity  Outcome Measure Difficulty turning over in bed (including adjusting bedclothes, sheets and blankets)?: None Difficulty moving from lying on back to sitting on the side of the bed? : None Difficulty sitting down on and standing up from a chair with arms (e.g., wheelchair, bedside commode, etc,.)?: Total Help needed moving to and from a bed to chair (including a wheelchair)?: A Little Help needed walking in hospital room?: A Little Help  needed climbing 3-5 steps with a railing? : A Little 6 Click Score: 18    End of Session Equipment Utilized During Treatment: Gait belt Activity Tolerance: Patient tolerated treatment well Patient left: in chair;with call bell/phone within reach;with chair alarm set Nurse Communication: Mobility status PT Visit Diagnosis: Unsteadiness on feet (R26.81);Muscle weakness (generalized) (M62.81);Difficulty in walking, not elsewhere classified (R26.2)    Time: 1343-1410 PT Time Calculation (min) (ACUTE ONLY): 27 min   Charges:   PT Evaluation $PT Eval Moderate Complexity: 1 Procedure PT Treatments $Gait Training: 8-22 mins   PT G Codes:        Scheryl Marten PT, DPT  [phone]   Jacqulyn Liner Sloan Leiter 08/02/2016, 3:13 PM

## 2016-08-02 NOTE — Progress Notes (Signed)
PROGRESS NOTE   CORD WILCZYNSKI  WNI:[id number]    DOB: 1923/08/05    DOA: 07/30/2016  PCP: Deland Pretty, MD   I have briefly reviewed patients previous medical [facility] in [facility].  Brief Narrative:  83 year old male, lives at an independent living facility, ambulates with the help of a walker, not on home oxygen, PMH of CAD status post CABG, stent, DM 2, chronic systolic CHF, cardiomyopathy, complete heart block status post pacemaker, PAF on amiodarone, hypothyroid, HLD, HTN, iron deficiency anemia, GERD, chronic chest pain, presented with a several days history of progressively worsening dyspnea, orthopnea, no change in chronic chest pain, worsening lower extremity swelling but weight stable at 155 pounds. He was hospitalized 6/19-6/22 for acute lower GI bleed, EGD and colonoscopy were deferred due to high cardiac risk, ABLA, type 2 NSTEMI. He also reports a couple of episodes of diarrhea. Chest x-ray and clinical picture consistent with decompensated CHF. Echo shows worsening EF. Cardiology consulted.   Assessment & Plan:   Principal Problem:   Acute on chronic systolic and diastolic heart failure, NYHA class 4 (HCC) Active Problems:   HYPERCHOLESTEROLEMIA   Essential hypertension   CABG '96   GERD   PAF (paroxysmal atrial fibrillation) ([place])   Pacemaker implanted 12/12/13 (MDT)   Complete heart block (HCC)   Stage 3 chronic renal impairment associated with type 2 diabetes mellitus ([place])   Atypical chest pain   Acute respiratory failure with hypoxia ([place])   1. Acute on chronic combined CHF: Cardiology consultation appreciated. Echo this admission shows worsening of EF from 45-50 percent in 2015 to 25-30 percent. As per cardiology, his symptoms likely are related more to worsening cardiomyopathy than severe CHF. Amlodipine stopped. Entresto started. Continue carvedilol, atorvastatin, Plavix and Imdur. -1.1 L & -5 pounds since admission. Intake output and weight measurements  probably not accurate. Pro-calcitonin very low, low index of suspicion for pneumonia. Clinically improving. Cardiology following and plans to continue IV Lasix. Chest x-ray 7/29 and reviewed, improving, mild pulmonary edema. 2. Cardiomyopathy: Worsening EF as stated above. Likely ischemic cardiomyopathy. As per cardiology input, medical management for now. 3. CAD status post CABG, chronic stable angina: No change in his chest pain features. Medical management for now. Continue carvedilol, Imdur, Plavix and statin. No chest pain since admission. Stable. 4. Paroxysmal A. fib: AV paced rhythm. Amiodarone. Not anticoagulation candidate due to GI bleed. Stable. 5. Bilateral pleural effusions: Likely due to CHF. Diuresis and follow chest x-ray periodically. Improving. 6. Diarrhea: Unclear etiology. C. difficile antigen positive, toxin negative, PCR negative. GI pathogen panel PCR negative. Having intermittent diarrhea. Do not suspect fecal impaction. Consider antimotility agents. 7. Microcytic Anemia/iron deficiency: Recent GI bleed that required blood transfusion. Hemoglobin probably in the low 9 g range. No overt bleeding. No melena reported. Hemoglobin has gradually drifted down to 8.2. Follow CBC in a.m. and transfuse if hemoglobin less than 78 g per DL. Anemia panel: Iron 21, ferritin 29, folate 11.7 and B12: 542. Iron supplements. Hemoglobin stable. 8. Hypokalemia: Replaced. Magnesium 2.1. Follow BMP while on high-dose IV diuretics. 9. Type II DM: Mildly uncontrolled and fluctuating. Continue SSI.  10. Essential hypertension: Controlled. No change. 11. Stage III chronic kidney disease: Baseline creatinine probably in the 1.4 range. Current worsening may be related to poor perfusion from CHF and diuresis. Creatinine stable in the 1.5-1.6 range. 12. Acute respiratory failure with hypoxia: Due to CHF and pleural effusions. Treat underlying cause and wean oxygen as tolerated. Currently comfortable on room  air. Reassess with ambulation. Not hypoxic at rest.   DVT prophylaxis: SCDs Code Status: DO NOT RESUSCITATE Family Communication: None at bedside Disposition: DC home when medically stable   Consultants:  Cardiology   Procedures:  2-D echo 07/30/16: Study Conclusions  - Left ventricle: The cavity size was normal. Systolic function was   severely reduced. The estimated ejection fraction was in the   range of 25% to 30%. Severe diffuse hypokinesis with no   identifiable regional variations. There is substantial systolic   dyssynchrony, both septal-to-lateral wall and apex-to-base.   Doppler parameters are consistent with restrictive physiology,   indicative of decreased left ventricular diastolic compliance   and/or increased left atrial pressure. Acoustic contrast   opacification revealed no evidence ofthrombus. - Ventricular septum: Septal motion showed abnormal function,   dyssynergy, and paradox. These changes are consistent with right   ventricular pacing. - Aortic valve: Noncoronary cusp mobility was severely restricted.   Transvalvular velocity was increased less than expected, due to   low cardiac output. There was mild stenosis. There was mild   regurgitation directed centrally in the LVOT. Valve area (VTI):   1.41 cm^2. - Mitral valve: Calcified annulus. There was mild to moderate   regurgitation directed centrally. - Left atrium: The atrium was moderately dilated. - Right ventricle: Systolic function was mildly reduced. - Tricuspid valve: There was moderate regurgitation. - Pulmonary arteries: Systolic pressure was moderately increased.   PA peak pressure: 63 mm Hg (S).  Antimicrobials:  None    Subjective: Patient reports that he ambulated with assistance yesterday and did not have dyspnea but his legs were weak. No chest pain. Reported a loose stool last night and after I had seen him had an explosive BM. Orthopnea +. No cough.  ROS: Denies dizziness,  lightheadedness or palpitations. No cough, fever or chills reported. No change.  Objective:  Vitals:   08/01/16 1200 08/01/16 1929 08/02/16 0500 08/02/16 1130  BP: (!) 124/35 (!) 104/36 (!) 126/38 (!) 102/37  Pulse: 60 62 62 (!) 59  Resp: 18 18 18 16   Temp: 98.1 F (36.7 C) 98.4 F (36.9 C) 98 F (36.7 C) 98.5 F (36.9 C)  TempSrc: Oral Oral Oral Oral  SpO2: 98% 95% 95% 97%  Weight:   68.6 kg (151 lb 3.2 oz)   Height:        Examination:  General exam: Pleasant elderly male, moderately built and frail, chronically ill-looking, sitting up comfortably in chair this morning.Currently on room air. Extremely hard of hearing. Uses right hearing aid. Respiratory system: Few basal crackles but otherwise clear to auscultation. No increased work of breathing. Cardiovascular system: S1 & S2 heard, RRR. No JVD, murmurs, rubs, gallops or clicks. No pedal edema but has mild sacral edema. Telemetry: AV paced rhythm. Gastrointestinal system: Abdomen is nondistended, soft and nontender. No organomegaly or masses felt. Normal bowel sounds heard. Stable Central nervous system: Alert and oriented. No focal neurological deficits. Stable Extremities: Symmetric 5 x 5 power. Skin: No rashes, lesions or ulcers Psychiatry: Judgement and insight appear normal. Mood & affect appropriate.     Data Reviewed: I have personally reviewed following labs and imaging studies  CBC:  Recent Labs Lab 07/30/16 0852 07/31/16 0340 08/01/16 0547 08/02/16 0504  WBC 6.5 6.8 6.8 6.1  NEUTROABS 5.6  --   --   --   HGB 9.0* 8.6* 8.2* 8.8*  HCT 28.9* 27.7* 27.8* 29.2*  MCV 76.9* 76.1* 77.0* 76.6*  PLT 216 238 244 215  Basic Metabolic Panel:  Recent Labs Lab 07/30/16 0852 07/30/16 1617 07/31/16 0340 08/01/16 0547 08/02/16 0504  NA 137  --  140 140 138  K 3.5  --  3.2* 3.4* 4.0  CL 103  --  106 104 105  CO2 21*  --  23 25 21*  GLUCOSE 207*  --  161* 131* 140*  BUN 23*  --  20 22* 27*  CREATININE 1.83*   --  1.55* 1.65* 1.58*  CALCIUM 8.6*  --  8.0* 8.1* 8.4*  MG  --  2.1  --   --   --   PHOS  --  3.7  --   --   --    Liver Function Tests:  Recent Labs Lab 07/30/16 0852  AST 16  ALT 13*  ALKPHOS 65  BILITOT 1.4*  PROT 6.2*  ALBUMIN 3.5   Coagulation Profile: No results for input(s): INR, PROTIME in the last 168 hours. Cardiac Enzymes:  Recent Labs Lab 07/30/16 0852 07/30/16 1617  TROPONINI 0.03* 0.04*   HbA1C: No results for input(s): HGBA1C in the last 72 hours. CBG:  Recent Labs Lab 08/01/16 0727 08/01/16 1121 08/01/16 1620 08/01/16 2101 08/02/16 0735  GLUCAP 149* 204* 190* 216* 162*    Recent Results (from the past 240 hour(s))  Gastrointestinal Panel by PCR , Stool     Status: None   Collection Time: 07/30/16  1:35 PM  Result Value Ref Range Status   Campylobacter species NOT DETECTED NOT DETECTED Final   Plesimonas shigelloides NOT DETECTED NOT DETECTED Final   Salmonella species NOT DETECTED NOT DETECTED Final   Yersinia enterocolitica NOT DETECTED NOT DETECTED Final   Vibrio species NOT DETECTED NOT DETECTED Final   Vibrio cholerae NOT DETECTED NOT DETECTED Final   Enteroaggregative E coli (EAEC) NOT DETECTED NOT DETECTED Final   Enteropathogenic E coli (EPEC) NOT DETECTED NOT DETECTED Final   Enterotoxigenic E coli (ETEC) NOT DETECTED NOT DETECTED Final   Shiga like toxin producing E coli (STEC) NOT DETECTED NOT DETECTED Final   Shigella/Enteroinvasive E coli (EIEC) NOT DETECTED NOT DETECTED Final   Cryptosporidium NOT DETECTED NOT DETECTED Final   Cyclospora cayetanensis NOT DETECTED NOT DETECTED Final   Entamoeba histolytica NOT DETECTED NOT DETECTED Final   Giardia lamblia NOT DETECTED NOT DETECTED Final   Adenovirus F40/41 NOT DETECTED NOT DETECTED Final   Astrovirus NOT DETECTED NOT DETECTED Final   Norovirus GI/GII NOT DETECTED NOT DETECTED Final   Rotavirus A NOT DETECTED NOT DETECTED Final   Sapovirus (I, II, IV, and V) NOT DETECTED NOT  DETECTED Final  C difficile quick scan w PCR reflex     Status: Abnormal   Collection Time: 07/30/16  1:35 PM  Result Value Ref Range Status   C Diff antigen POSITIVE (A) NEGATIVE Final   C Diff toxin NEGATIVE NEGATIVE Final   C Diff interpretation Results are indeterminate. See PCR results.  Final  Clostridium Difficile by PCR     Status: None   Collection Time: 07/30/16  1:35 PM  Result Value Ref Range Status   Toxigenic C Difficile by pcr NEGATIVE NEGATIVE Final    Comment: Patient is colonized with non toxigenic C. difficile. May not need treatment unless significant symptoms are present.         Radiology Studies: Dg Chest 2 View  Result Date: 08/02/2016 CLINICAL DATA:  CHF EXAM: CHEST  2 VIEW COMPARISON:  07/31/2016 FINDINGS: Left pacer is unchanged. Prior CABG. Bilateral perihilar airspace  opacities have improved since prior study. Small layering effusions. IMPRESSION: Mild pulmonary edema/ CHF, improving since prior study. Small effusions. Electronically Signed   By: Rolm Baptise M.D.   On: 08/02/2016 07:55        Scheduled Meds: . amiodarone  100 mg Oral Daily  . atorvastatin  10 mg Oral Daily  . carvedilol  12.5 mg Oral BID WC  . clopidogrel  75 mg Oral Q breakfast  . finasteride  5 mg Oral Daily  . furosemide  80 mg Intravenous BID  . insulin aspart  0-5 Units Subcutaneous QHS  . insulin aspart  0-9 Units Subcutaneous TID WC  . isosorbide mononitrate  30 mg Oral Daily  . levothyroxine  112 mcg Oral QAC breakfast  . sacubitril-valsartan  1 tablet Oral BID   Continuous Infusions:    LOS: 2 days     Senovia Gauer, MD, FACP, FHM. [facility] Pager [phone]  If 7PM-7AM, please contact night-coverage [email or web address] Password [facility] 08/02/2016, 12:18 PM

## 2016-08-02 NOTE — Progress Notes (Signed)
The urine was mixed in with the stool wasn't able to measure.

## 2016-08-02 NOTE — Progress Notes (Signed)
Pt is stable during AM shift, no any specific complain of pain, daughter is in bed side and is updating, pt is diuresing well, BM sift and once today, ambulated in a hallway, worked with PT, will continue to monitor

## 2016-08-02 NOTE — Progress Notes (Signed)
Progress Note  Patient Name: Evan [name] Date of Encounter: 08/02/2016  Primary Cardiologist: Nishan/Allred  Subjective   Breathing still difficult in bed  Having explosive diarrhea this AM    Inpatient Medications    Scheduled Meds: . amiodarone  100 mg Oral Daily  . atorvastatin  10 mg Oral Daily  . carvedilol  12.5 mg Oral BID WC  . clopidogrel  75 mg Oral Q breakfast  . finasteride  5 mg Oral Daily  . insulin aspart  0-5 Units Subcutaneous QHS  . insulin aspart  0-9 Units Subcutaneous TID WC  . isosorbide mononitrate  30 mg Oral Daily  . levothyroxine  112 mcg Oral QAC breakfast  . sacubitril-valsartan  1 tablet Oral BID   Continuous Infusions:  PRN Meds:  Vital Signs    Vitals:   08/01/16 0842 08/01/16 1200 08/01/16 1929 08/02/16 0500  BP: (!) 125/37 (!) 124/35 (!) 104/36 (!) 126/38  Pulse: (!) 58 60 62 62  Resp: 18 18 18 18   Temp: 97.6 F (36.4 C) 98.1 F (36.7 C) 98.4 F (36.9 C) 98 F (36.7 C)  TempSrc: Oral Oral Oral Oral  SpO2: 96% 98% 95% 95%  Weight:    151 lb 3.2 oz (68.6 kg)  Height:        Intake/Output Summary (Last 24 hours) at 08/02/16 0744 Last data filed at 08/02/16 0500  Gross per 24 hour  Intake              960 ml  Output              852 ml  Net              108 ml   Filed Weights   07/30/16 1434 08/01/16 0451 08/02/16 0500  Weight: 155 lb 6.4 oz (70.5 kg) 151 lb 12.8 oz (68.9 kg) 151 lb 3.2 oz (68.6 kg)    Telemetry     - Personally Reviewed  ECG      Physical Exam   GEN: No acute distress.   Neck: No JVD Cardiac: RRR, no murmurs, rubs, or gallops.  Respiratory: Rel clear   GI: Soft, nontender, non-distended  MS: 1+ edema; No deformity. Neuro:  Nonfocal  Psych: Normal affect   Labs    Chemistry Recent Labs Lab 07/30/16 0852 07/31/16 0340 08/01/16 0547 08/02/16 0504  NA 137 140 140 138  K 3.5 3.2* 3.4* 4.0  CL 103 106 104 105  CO2 21* 23 25 21*  GLUCOSE 207* 161* 131* 140*  BUN 23* 20 22* 27*   CREATININE 1.83* 1.55* 1.65* 1.58*  CALCIUM 8.6* 8.0* 8.1* 8.4*  PROT 6.2*  --   --   --   ALBUMIN 3.5  --   --   --   AST 16  --   --   --   ALT 13*  --   --   --   ALKPHOS 65  --   --   --   BILITOT 1.4*  --   --   --   GFRNONAA 31* 38* 35* 37*  GFRAA 36* 44* 41* 43*  ANIONGAP 13 11 11 12      Hematology Recent Labs Lab 07/31/16 0340 08/01/16 0547 08/02/16 0504  WBC 6.8 6.8 6.1  RBC 3.64* 3.61* 3.81*  HGB 8.6* 8.2* 8.8*  HCT 27.7* 27.8* 29.2*  MCV 76.1* 77.0* 76.6*  MCH 23.6* 22.7* 23.1*  MCHC 31.0 29.5* 30.1  RDW 16.9* 17.1* 17.7*  PLT 238 244 215    Cardiac Enzymes Recent Labs Lab 07/30/16 0852 07/30/16 1617  TROPONINI 0.03* 0.04*   No results for input(s): TROPIPOC in the last 168 hours.   BNP Recent Labs Lab 07/30/16 0852  BNP 1,276.9*     DDimer No results for input(s): DDIMER in the last 168 hours.   Radiology    Dg Chest 2 View  Result Date: 07/31/2016 CLINICAL DATA:  Respiratory failure. EXAM: CHEST  2 VIEW COMPARISON:  07/30/2016. FINDINGS: Cardiac pacer in stable position. Prior CABG. Cardiomegaly. Progressive bilateral pulmonary infiltrates consistent pulmonary edema. Tiny bilateral pleural effusions cannot be excluded. No pneumothorax . IMPRESSION: Cardiac pacer in stable position. Prior CABG. Congestive heart failure with continued progression of bilateral pulmonary edema. Tiny bilateral pleural effusions . Electronically Signed   By: Marcello Moores  Register   On: 07/31/2016 08:02    Cardiac Studies     Patient Profile       Assessment & Plan    1  Acute on chronic systolic CHF  Volume is still up on exam  I would  Resume 80 mg lasix  IV bid   2  CAD  Pt is s/p CABG   Medical Rx    3  CKD Cr improving  1.58  Signed, Dorris Carnes, MD  08/02/2016, 7:44 AM

## 2016-08-02 NOTE — Progress Notes (Signed)
SATURATION QUALIFICATIONS: (This note is used to comply with regulatory documentation for home oxygen)  Patient Saturations on Room Air at Rest = 96%  Patient Saturations on Room Air while Ambulating =94%  Pt's oxygen is maintained in RA at 96%, will continue to monitor

## 2016-08-03 DIAGNOSIS — R197 Diarrhea, unspecified: Secondary | ICD-10-CM

## 2016-08-03 DIAGNOSIS — E1122 Type 2 diabetes mellitus with diabetic chronic kidney disease: Secondary | ICD-10-CM

## 2016-08-03 DIAGNOSIS — N183 Chronic kidney disease, stage 3 (moderate): Secondary | ICD-10-CM

## 2016-08-03 DIAGNOSIS — I25810 Atherosclerosis of coronary artery bypass graft(s) without angina pectoris: Secondary | ICD-10-CM

## 2016-08-03 LAB — BASIC METABOLIC PANEL
Anion gap: 11 (ref 5–15)
BUN: 29 mg/dL — AB (ref 6–20)
CHLORIDE: 105 mmol/L (ref 101–111)
CO2: 23 mmol/L (ref 22–32)
Calcium: 8.2 mg/dL — ABNORMAL LOW (ref 8.9–10.3)
Creatinine, Ser: 1.75 mg/dL — ABNORMAL HIGH (ref 0.61–1.24)
GFR calc Af Amer: 38 mL/min — ABNORMAL LOW (ref >60)
GFR calc non Af Amer: 33 mL/min — ABNORMAL LOW (ref >60)
GLUCOSE: 137 mg/dL — AB (ref 65–99)
POTASSIUM: 3.8 mmol/L (ref 3.5–5.1)
Sodium: 139 mmol/L (ref 135–145)

## 2016-08-03 LAB — PROCALCITONIN: Procalcitonin: <0.1 ng/mL

## 2016-08-03 LAB — GLUCOSE, CAPILLARY
GLUCOSE-CAPILLARY: 149 mg/dL — AB (ref 65–99)
GLUCOSE-CAPILLARY: 128 mg/dL — AB (ref 65–99)
GLUCOSE-CAPILLARY: 189 mg/dL — AB (ref 65–99)
Glucose-Capillary: 323 mg/dL — ABNORMAL HIGH (ref 65–99)

## 2016-08-03 MED ORDER — DIPHENHYDRAMINE HCL 12.5 MG/5ML PO ELIX
12.5000 mg | ORAL_SOLUTION | Freq: Every evening | ORAL | Status: DC | PRN
  Administered 2016-08-04: 12.5 mg via ORAL
  Filled 2016-08-03: qty 10

## 2016-08-03 NOTE — Progress Notes (Signed)
PROGRESS NOTE   Evan [name]  QIH:[id number]    DOB: Sep 15, 1923    DOA: 07/30/2016  PCP: Deland Pretty, MD   I have briefly reviewed patients previous medical records in [facility].  Brief Narrative:  83 year old male, lives at an independent living facility, ambulates with the help of a walker, not on home oxygen, PMH of CAD status post CABG, stent, DM 2, chronic systolic CHF, cardiomyopathy, complete heart block status post pacemaker, PAF on amiodarone, hypothyroid, HLD, HTN, iron deficiency anemia, GERD, chronic chest pain, presented with a several days history of progressively worsening dyspnea, orthopnea, no change in chronic chest pain, worsening lower extremity swelling but weight stable at 155 pounds. He was hospitalized 6/19-6/22 for acute lower GI bleed, EGD and colonoscopy were deferred due to high cardiac risk, ABLA, type 2 NSTEMI. He also reports a couple of episodes of diarrhea. Chest x-ray and clinical picture consistent with decompensated CHF. Echo shows worsening EF. Cardiology consulted.CHF improving. Creatinine increased. IV Lasix stopped 7/30. Resume oral Lasix 7/31. DC to SNF when bed available.   Assessment & Plan:   Principal Problem:   Acute on chronic systolic and diastolic heart failure, NYHA class 4 (HCC) Active Problems:   HYPERCHOLESTEROLEMIA   Essential hypertension   CABG '96   GERD   PAF (paroxysmal atrial fibrillation) ([place])   Pacemaker implanted 12/12/13 (MDT)   Complete heart block (HCC)   Stage 3 chronic renal impairment associated with type 2 diabetes mellitus ([place])   Atypical chest pain   Acute respiratory failure with hypoxia ([place])   1. Acute on chronic combined CHF: Cardiology consultation appreciated. Echo this admission shows worsening of EF from 45-50 percent in 2015 to 25-30 percent. As per cardiology, his symptoms likely are related more to worsening cardiomyopathy than severe CHF. Amlodipine stopped. Entresto started. Continue  carvedilol, atorvastatin, Plavix and Imdur. -.731 L since admission. Intake output and weight measurements probably not accurate. Pro-calcitonin very low, low index of suspicion for pneumonia. Chest x-ray 7/29 and reviewed, improving, mild pulmonary edema. Patient was continued on high-dose IV Lasix. Clinically improved. Orthopnea almost resolved. Creatinine has increased from 1.58-1.75. Cardiology follow-up appreciated, discontinued IV Lasix and plans to start oral Lasix 7/31 pending creatinine improvement. 2. Cardiomyopathy: Worsening EF as stated above. Likely ischemic cardiomyopathy. As per cardiology input, medical management for now. No change. 3. CAD status post CABG, chronic stable angina: No change in his chest pain features. Medical management for now. Continue carvedilol, Imdur, Plavix and statin. No chest pain since admission. Stable. 4. Paroxysmal A. fib: AV paced rhythm. Amiodarone. Not anticoagulation candidate due to GI bleed. Stable. 5. Bilateral pleural effusions: Likely due to CHF. Diuresis and follow chest x-ray periodically. Improving. Stable. 6. Diarrhea: Unclear etiology. C. difficile antigen positive, toxin negative, PCR negative. GI pathogen panel PCR negative. Do not suspect fecal impaction. Resolved as per patient and RN reports. 7. Microcytic Anemia/iron deficiency: Recent GI bleed that required blood transfusion. Hemoglobin probably in the low 9 g range. No overt bleeding. No melena reported. Anemia panel: Iron 21, ferritin 29, folate 11.7 and B12: 542. Iron supplements. Hemoglobin stable. Outpatient follow-up. 8. Hypokalemia: Replaced. Magnesium 2.1.  9. Type II DM: Mildly uncontrolled and fluctuating. Continue SSI.  10. Essential hypertension: Controlled. Stable. 11. Acute on Stage III chronic kidney disease: Baseline creatinine probably in the 1.4 range. Creatinine has worsened from 1.58-1.75 over the last 24 hours, likely related to diuresis. Diuretics held as stated  above. 12. Acute respiratory failure with  hypoxia: Due to CHF and pleural effusions. Treated underlying cause. Hypoxia resolved.   DVT prophylaxis: SCDs Code Status: DO NOT RESUSCITATE Family Communication: None at bedside Disposition: DC SNF as per PT recommendation, possibly 7/31.   Consultants:  Cardiology   Procedures:  2-D echo 07/30/16: Study Conclusions  - Left ventricle: The cavity size was normal. Systolic function was   severely reduced. The estimated ejection fraction was in the   range of 25% to 30%. Severe diffuse hypokinesis with no   identifiable regional variations. There is substantial systolic   dyssynchrony, both septal-to-lateral wall and apex-to-base.   Doppler parameters are consistent with restrictive physiology,   indicative of decreased left ventricular diastolic compliance   and/or increased left atrial pressure. Acoustic contrast   opacification revealed no evidence ofthrombus. - Ventricular septum: Septal motion showed abnormal function,   dyssynergy, and paradox. These changes are consistent with right   ventricular pacing. - Aortic valve: Noncoronary cusp mobility was severely restricted.   Transvalvular velocity was increased less than expected, due to   low cardiac output. There was mild stenosis. There was mild   regurgitation directed centrally in the LVOT. Valve area (VTI):   1.41 cm^2. - Mitral valve: Calcified annulus. There was mild to moderate   regurgitation directed centrally. - Left atrium: The atrium was moderately dilated. - Right ventricle: Systolic function was mildly reduced. - Tricuspid valve: There was moderate regurgitation. - Pulmonary arteries: Systolic pressure was moderately increased.   PA peak pressure: 63 mm Hg (S).  Antimicrobials:  None    Subjective: States that he was able to sleep flat in bed all night last night. Denies dyspnea at rest and with activity. Ambulated yesterday and not hypoxic. Patient and RN  reported that he's been having formed stools for a couple of days.  ROS: Denies dizziness, lightheadedness or palpitations. No cough, fever or chills reported. No change.  Objective:  Vitals:   08/02/16 1717 08/02/16 2033 08/03/16 0505 08/03/16 1258  BP: 110/60 (!) 117/50 (!) 122/54 (!) 117/36  Pulse: 60 (!) 58 61 (!) 59  Resp:  14  16  Temp:  99.6 F (37.6 C) 98.7 F (37.1 C) 98.1 F (36.7 C)  TempSrc:  Oral Oral Oral  SpO2:  96% 98% 98%  Weight:   71.1 kg (156 lb 12.8 oz)   Height:        Examination:  General exam: Pleasant elderly male, moderately built and frail, chronically ill-looking, lying comfortably supine in bed. Extremely hard of hearing. Uses right hearing aid. Respiratory system: Clear to auscultation. No increased work of breathing. Cardiovascular system: S1 & S2 heard, RRR. No JVD, murmurs, rubs, gallops or clicks. Trace pedal edema & has mild sacral edema. Telemetry: AV paced rhythm. Gastrointestinal system: Abdomen is nondistended, soft and nontender. No organomegaly or masses felt. Normal bowel sounds heard. Stable Central nervous system: Alert and oriented. No focal neurological deficits. Stable Extremities: Symmetric 5 x 5 power. Skin: No rashes, lesions or ulcers Psychiatry: Judgement and insight appear normal. Mood & affect appropriate.     Data Reviewed: I have personally reviewed following labs and imaging studies  CBC:  Recent Labs Lab 07/30/16 0852 07/31/16 0340 08/01/16 0547 08/02/16 0504  WBC 6.5 6.8 6.8 6.1  NEUTROABS 5.6  --   --   --   HGB 9.0* 8.6* 8.2* 8.8*  HCT 28.9* 27.7* 27.8* 29.2*  MCV 76.9* 76.1* 77.0* 76.6*  PLT 216 238 244 976   Basic Metabolic Panel:  Recent Labs Lab 07/30/16 0852 07/30/16 1617 07/31/16 0340 08/01/16 0547 08/02/16 0504 08/03/16 0727  NA 137  --  140 140 138 139  K 3.5  --  3.2* 3.4* 4.0 3.8  CL 103  --  106 104 105 105  CO2 21*  --  23 25 21* 23  GLUCOSE 207*  --  161* 131* 140* 137*  BUN  23*  --  20 22* 27* 29*  CREATININE 1.83*  --  1.55* 1.65* 1.58* 1.75*  CALCIUM 8.6*  --  8.0* 8.1* 8.4* 8.2*  MG  --  2.1  --   --   --   --   PHOS  --  3.7  --   --   --   --    Liver Function Tests:  Recent Labs Lab 07/30/16 0852  AST 16  ALT 13*  ALKPHOS 65  BILITOT 1.4*  PROT 6.2*  ALBUMIN 3.5   Coagulation Profile: No results for input(s): INR, PROTIME in the last 168 hours. Cardiac Enzymes:  Recent Labs Lab 07/30/16 0852 07/30/16 1617  TROPONINI 0.03* 0.04*   HbA1C: No results for input(s): HGBA1C in the last 72 hours. CBG:  Recent Labs Lab 08/02/16 1231 08/02/16 1655 08/02/16 2145 08/03/16 0748 08/03/16 1219  GLUCAP 181* 249* 143* 149* 323*    Recent Results (from the past 240 hour(s))  Gastrointestinal Panel by PCR , Stool     Status: None   Collection Time: 07/30/16  1:35 PM  Result Value Ref Range Status   Campylobacter species NOT DETECTED NOT DETECTED Final   Plesimonas shigelloides NOT DETECTED NOT DETECTED Final   Salmonella species NOT DETECTED NOT DETECTED Final   Yersinia enterocolitica NOT DETECTED NOT DETECTED Final   Vibrio species NOT DETECTED NOT DETECTED Final   Vibrio cholerae NOT DETECTED NOT DETECTED Final   Enteroaggregative E coli (EAEC) NOT DETECTED NOT DETECTED Final   Enteropathogenic E coli (EPEC) NOT DETECTED NOT DETECTED Final   Enterotoxigenic E coli (ETEC) NOT DETECTED NOT DETECTED Final   Shiga like toxin producing E coli (STEC) NOT DETECTED NOT DETECTED Final   Shigella/Enteroinvasive E coli (EIEC) NOT DETECTED NOT DETECTED Final   Cryptosporidium NOT DETECTED NOT DETECTED Final   Cyclospora cayetanensis NOT DETECTED NOT DETECTED Final   Entamoeba histolytica NOT DETECTED NOT DETECTED Final   Giardia lamblia NOT DETECTED NOT DETECTED Final   Adenovirus F40/41 NOT DETECTED NOT DETECTED Final   Astrovirus NOT DETECTED NOT DETECTED Final   Norovirus GI/GII NOT DETECTED NOT DETECTED Final   Rotavirus A NOT DETECTED NOT  DETECTED Final   Sapovirus (I, II, IV, and V) NOT DETECTED NOT DETECTED Final  C difficile quick scan w PCR reflex     Status: Abnormal   Collection Time: 07/30/16  1:35 PM  Result Value Ref Range Status   C Diff antigen POSITIVE (A) NEGATIVE Final   C Diff toxin NEGATIVE NEGATIVE Final   C Diff interpretation Results are indeterminate. See PCR results.  Final  Clostridium Difficile by PCR     Status: None   Collection Time: 07/30/16  1:35 PM  Result Value Ref Range Status   Toxigenic C Difficile by pcr NEGATIVE NEGATIVE Final    Comment: Patient is colonized with non toxigenic C. difficile. May not need treatment unless significant symptoms are present.         Radiology Studies: Dg Chest 2 View  Result Date: 08/02/2016 CLINICAL DATA:  CHF EXAM: CHEST  2 VIEW COMPARISON:  07/31/2016 FINDINGS: Left pacer is unchanged. Prior CABG. Bilateral perihilar airspace opacities have improved since prior study. Small layering effusions. IMPRESSION: Mild pulmonary edema/ CHF, improving since prior study. Small effusions. Electronically Signed   By: Rolm Baptise M.D.   On: 08/02/2016 07:55        Scheduled Meds: . amiodarone  100 mg Oral Daily  . atorvastatin  10 mg Oral Daily  . carvedilol  12.5 mg Oral BID WC  . clopidogrel  75 mg Oral Q breakfast  . finasteride  5 mg Oral Daily  . furosemide  80 mg Intravenous BID  . insulin aspart  0-5 Units Subcutaneous QHS  . insulin aspart  0-9 Units Subcutaneous TID WC  . isosorbide mononitrate  30 mg Oral Daily  . levothyroxine  112 mcg Oral QAC breakfast  . saccharomyces boulardii  250 mg Oral BID  . sacubitril-valsartan  1 tablet Oral BID   Continuous Infusions:    LOS: 3 days     Adel Burch, MD, FACP, FHM. [facility] Pager [phone]  If 7PM-7AM, please contact night-coverage [email or web address] Password [email or web address] 08/03/2016, 1:37 PM

## 2016-08-03 NOTE — Clinical Social Work Note (Signed)
Bed offers given to patient and his daughter. They have chosen [facility] and the SNF can take patient tomorrow. CSW faxed clinicals to [facility] for insurance authorization review.   Dayton Scrape, [place]

## 2016-08-03 NOTE — Progress Notes (Signed)
Cardiology MD at bedside stated to hold IV lasix this morning due to rising BUN Cr  MD aware of lab values from admission to now   [employer]

## 2016-08-03 NOTE — Progress Notes (Signed)
Progress Note  Patient Name: Evan [name] Date of Encounter: 08/03/2016  Primary Cardiologist: Nishan/Allred  Subjective   Breathing is better this morning. Able to lay flat in the bed without difficulty.   Inpatient Medications    Scheduled Meds: . amiodarone  100 mg Oral Daily  . atorvastatin  10 mg Oral Daily  . carvedilol  12.5 mg Oral BID WC  . clopidogrel  75 mg Oral Q breakfast  . finasteride  5 mg Oral Daily  . furosemide  80 mg Intravenous BID  . insulin aspart  0-5 Units Subcutaneous QHS  . insulin aspart  0-9 Units Subcutaneous TID WC  . isosorbide mononitrate  30 mg Oral Daily  . levothyroxine  112 mcg Oral QAC breakfast  . saccharomyces boulardii  250 mg Oral BID  . sacubitril-valsartan  1 tablet Oral BID   Continuous Infusions:  PRN Meds: acetaminophen **OR** acetaminophen, albuterol, hydrALAZINE, loperamide, nitroGLYCERIN, ondansetron **OR** ondansetron (ZOFRAN) IV   Vital Signs    Vitals:   08/02/16 1130 08/02/16 1717 08/02/16 2033 08/03/16 0505  BP: (!) 102/37 110/60 (!) 117/50 (!) 122/54  Pulse: (!) 59 60 (!) 58 61  Resp: 16  14   Temp: 98.5 F (36.9 C)  99.6 F (37.6 C) 98.7 F (37.1 C)  TempSrc: Oral  Oral Oral  SpO2: 97%  96% 98%  Weight:    156 lb 12.8 oz (71.1 kg)  Height:        Intake/Output Summary (Last 24 hours) at 08/03/16 0805 Last data filed at 08/03/16 0505  Gross per 24 hour  Intake              780 ml  Output              620 ml  Net              160 ml   Filed Weights   08/01/16 0451 08/02/16 0500 08/03/16 0505  Weight: 151 lb 12.8 oz (68.9 kg) 151 lb 3.2 oz (68.6 kg) 156 lb 12.8 oz (71.1 kg)    Telemetry    AV paced - Personally Reviewed  ECG    N/a - Personally Reviewed  Physical Exam   General: Well developed, well nourished, male appearing in no acute distress. Head: Normocephalic, atraumatic.  Neck: Supple without bruits, JVD. Lungs:  Resp regular and unlabored, Mildly diminished  bilaterally. Heart: RRR, S1, S2, no S3, S4, 4/6 systolic murmur; no rub. Abdomen: Soft, non-tender, non-distended with normoactive bowel sounds. No hepatomegaly. No rebound/guarding. No obvious abdominal masses. Extremities: No clubbing, cyanosis, 1+ LE edema. Distal pedal pulses are 2+ bilaterally. Neuro: Alert and oriented X 3. Moves all extremities spontaneously. Psych: Normal affect.  Labs    Chemistry Recent Labs Lab 07/30/16 [id number]  08/01/16 0547 08/02/16 0504 08/03/16 0727  NA 137  < > 140 138 139  K 3.5  < > 3.4* 4.0 3.8  CL 103  < > 104 105 105  CO2 21*  < > 25 21* 23  GLUCOSE 207*  < > 131* 140* 137*  BUN 23*  < > 22* 27* 29*  CREATININE 1.83*  < > 1.65* 1.58* 1.75*  CALCIUM 8.6*  < > 8.1* 8.4* 8.2*  PROT 6.2*  --   --   --   --   ALBUMIN 3.5  --   --   --   --   AST 16  --   --   --   --  ALT 13*  --   --   --   --   ALKPHOS 65  --   --   --   --   BILITOT 1.4*  --   --   --   --   GFRNONAA 31*  < > 35* 37* 33*  GFRAA 36*  < > 41* 43* 38*  ANIONGAP 13  < > 11 12 11   < > = values in this interval not displayed.   Hematology Recent Labs Lab 07/31/16 0340 08/01/16 0547 08/02/16 0504  WBC 6.8 6.8 6.1  RBC 3.64* 3.61* 3.81*  HGB 8.6* 8.2* 8.8*  HCT 27.7* 27.8* 29.2*  MCV 76.1* 77.0* 76.6*  MCH 23.6* 22.7* 23.1*  MCHC 31.0 29.5* 30.1  RDW 16.9* 17.1* 17.7*  PLT 238 244 215    Cardiac Enzymes Recent Labs Lab 07/30/16 0852 07/30/16 1617  TROPONINI 0.03* 0.04*   No results for input(s): TROPIPOC in the last 168 hours.   BNP Recent Labs Lab 07/30/16 0852  BNP 1,276.9*     DDimer No results for input(s): DDIMER in the last 168 hours.    Radiology    Dg Chest 2 View  Result Date: 08/02/2016 CLINICAL DATA:  CHF EXAM: CHEST  2 VIEW COMPARISON:  07/31/2016 FINDINGS: Left pacer is unchanged. Prior CABG. Bilateral perihilar airspace opacities have improved since prior study. Small layering effusions. IMPRESSION: Mild pulmonary edema/ CHF, improving  since prior study. Small effusions. Electronically Signed   By: Rolm Baptise M.D.   On: 08/02/2016 07:55    Cardiac Studies   TTE: 07/30/16  Study Conclusions  - Left ventricle: The cavity size was normal. Systolic function was   severely reduced. The estimated ejection fraction was in the   range of 25% to 30%. Severe diffuse hypokinesis with no   identifiable regional variations. There is substantial systolic   dyssynchrony, both septal-to-lateral wall and apex-to-base.   Doppler parameters are consistent with restrictive physiology,   indicative of decreased left ventricular diastolic compliance   and/or increased left atrial pressure. Acoustic contrast   opacification revealed no evidence ofthrombus. - Ventricular septum: Septal motion showed abnormal function,   dyssynergy, and paradox. These changes are consistent with right   ventricular pacing. - Aortic valve: Noncoronary cusp mobility was severely restricted.   Transvalvular velocity was increased less than expected, due to   low cardiac output. There was mild stenosis. There was mild   regurgitation directed centrally in the LVOT. Valve area (VTI):   1.41 cm^2. - Mitral valve: Calcified annulus. There was mild to moderate   regurgitation directed centrally. - Left atrium: The atrium was moderately dilated. - Right ventricle: Systolic function was mildly reduced. - Tricuspid valve: There was moderate regurgitation. - Pulmonary arteries: Systolic pressure was moderately increased.   PA peak pressure: 63 mm Hg (S).  Patient Profile     83 y.o. male with past medical history of CAD s/p CABG 5 vessel (1996), s/p DES to SVG --> OM 1/2 (2015), diabetes, chronic systolic heart failure, PVD, hypothyroidism, complete heart block status post pacemaker, PAF and PAT on amiodarone who presented with shortness of breath and found to have newly reduced EF.  Assessment & Plan    1. Acute on Chronic combined HF: Echo this admission  showed worsening LV function of 25-30% with elevated BNP on admission. Given IV lasix, but then held in the setting of GI issues. More dyspneic yesterday and was resumed. Able to lay flat in the bed  without distress. Weaned from O2. -- Cr/BUN now climbing. Consider transitioning to oral lasix given respiratory status improvement.  -- on medical therapy with ASA, Plavix, BB, Imdur and Entresto  2. CAD s/p CABG 5v: No reports of chest pain. Last cath in 2015 with 5/5 patent grafts but DES placed to SVG--OM for ISR. Suspect fall in EF related to worsening CAD. Planned for medical therapy at this time. On good medications.   3. CKD stage II: Baseline Cr around 1.5, BUN/Cr now trending up with diuresis. Consider transition to oral lasix   4. Bilateral pleural effusions: Improving with diuresis on CXR from 7/29.   5. Anemia: stable at 8.8 today  Signed, Reino Bellis, NP  08/03/2016, 8:05 AM    I have examined the patient and reviewed assessment and plan and discussed with patient.  Agree with above as stated.  Breathing well and able to lie flat.  Increase creatinine.  Stop Lasix IV today and restart oral Lasix tomorrow based on Cr.  Larae Grooms

## 2016-08-03 NOTE — Clinical Social Work Note (Signed)
Clinical Social Work Assessment  Patient Details  Name: Evan [name] MRN: [id number] Date of Birth: [birth year]  Date of referral:  08/03/16               Reason for consult:  Facility Placement, Discharge Planning                Permission sought to share information with:  Facility Sport and exercise psychologist, Family Supports Permission granted to share information::  Yes, Verbal Permission Granted  Name::     Evan [name]  Agency::  SNF's  Relationship::  Daughter/HCPOA  Contact Information:  [phone]  Housing/Transportation Living arrangements for the past 2 months:  [place] of Information:  Patient, Medical Team, Adult Children Patient Interpreter Needed:  None Criminal Activity/Legal Involvement Pertinent to Current Situation/Hospitalization:  No - Comment as needed Significant Relationships:  Adult Children Lives with:  Facility Resident, Self Do you feel safe going back to the place where you live?  Yes Need for family participation in patient care:  Yes (Comment)  Care giving concerns:  PT recommending SNF placement once medically stable for discharge.   Social Worker assessment / plan:  CSW met with patient. No supports at bedside. CSW introduced role and explained that PT recommendations would be discussed. Patient confirmed that he is from [place]. He is agreeable to SNF placement and is familiar with [facility]. CSW provided SNF list to review more options in the even there is no bed availability at [facility]. Patient asked that CSW call his daughter, Evan [name]. He stated that she is his HCPOA and he wants her involved in the conversation. CSW called Evan [name] who reinforced familiarity with [facility]. CSW will email her the SNF list as she lives in [place]. She would like to do research on local facilities. Per MD, patient will likely discharge tomorrow. Patient's daughter will come to the hospital on the day of discharge since she  lives so far away. No further concerns. CSW encouraged patient and his daughter to contact CSW as needed. CSW will continue to follow patient and his daughter for support and facilitate discharge to SNF once medically stable.  Employment status:  Retired Nurse, adult PT Recommendations:  [place] / Referral to community resources:  [place]  Patient/Family's Response to care:  Patient and his daughter agreeable to SNF placement. Patient's daughters supportive and involved in patient's care. Patient and his daughter appreciated social work intervention.  Patient/Family's Understanding of and Emotional Response to Diagnosis, Current Treatment, and Prognosis:  Patient and his daughter have a good understanding of the reason for admission and his need for rehab prior to returning home. Patient and his daughter appear happy with hospital care.  Emotional Assessment Appearance:  Appears stated age Attitude/Demeanor/Rapport:  Other ([place]) Affect (typically observed):  Accepting, Appropriate, Calm, [place] Orientation:  Oriented to Self, Oriented to Place, Oriented to  Time, Oriented to Situation Alcohol / Substance use:  Never Used Psych involvement (Current and /or in the community):  No (Comment)  Discharge Needs  Concerns to be addressed:  Care Coordination Readmission within the last 30 days:  No Current discharge risk:  Dependent with Mobility, Lives alone Barriers to Discharge:  Continued Medical Work up, [place], LCSW 08/03/2016, 12:44 PM

## 2016-08-03 NOTE — Clinical Social Work Placement (Signed)
   CLINICAL SOCIAL WORK PLACEMENT  NOTE  Date:  08/03/2016  Patient Details  Name: TARRIN LEBOW MRN: [id number] Date of Birth: Jan 04, 1924  Clinical Social Work is seeking post-discharge placement for this patient at the [place] level of care (*CSW will initial, date and re-position this form in  chart as items are completed):  Yes   Patient/family provided with [facility] Work Department's list of facilities offering this level of care within the geographic area requested by the patient (or if unable, by the patient's family).  Yes   Patient/family informed of their freedom to choose among providers that offer the needed level of care, that participate in Medicare, Medicaid or managed care program needed by the patient, have an available bed and are willing to accept the patient.  Yes   Patient/family informed of [facility]'s ownership interest in [facility] and [facility], as well as of the fact that they are under no obligation to receive care at these facilities.  PASRR submitted to EDS on 08/03/16     PASRR number received on 08/03/16     Existing PASRR number confirmed on       FL2 transmitted to all facilities in geographic area requested by pt/family on 08/03/16     FL2 transmitted to all facilities within larger geographic area on       Patient informed that his/her managed care company has contracts with or will negotiate with certain facilities, including the following:            Patient/family informed of bed offers received.  Patient chooses bed at       Physician recommends and patient chooses bed at      Patient to be transferred to   on  .  Patient to be transferred to facility by       Patient family notified on   of transfer.  Name of family member notified:        PHYSICIAN Please sign FL2, Please sign DNR     Additional Comment:    _______________________________________________ Candie Chroman,  LCSW 08/03/2016, 12:47 PM

## 2016-08-03 NOTE — NC FL2 (Signed)
[place] LEVEL OF CARE SCREENING TOOL     IDENTIFICATION  Patient Name: Evan [name] Birthdate: 1923/01/20 Sex: male Admission Date (Current Location): 07/30/2016  [facility] and [place] Number:  Herbalist and Address:  [facility] [place] [place] [address], [place], [place] [address]      Provider Number: [id number]  Attending Physician Name and Address:  Modena Jansky, MD  Relative Name and Phone Number:       Current Level of Care: Hospital Recommended Level of Care: [place] Prior Approval Number:    Date Approved/Denied:   PASRR Number: [id number]  Discharge Plan: SNF    Current Diagnoses: Patient Active Problem List   Diagnosis Date Noted  . Atypical chest pain 07/30/2016  . Acute respiratory failure with hypoxia ([place]) 07/30/2016  . Acute respiratory failure with hypoxemia ([place])   . NSTEMI (non-ST elevated myocardial infarction) ([place]) 06/24/2016  . Rectal bleeding 06/23/2016  . Acute blood loss anemia 06/23/2016  . Patient had 2 or more falls in past year 01/20/2014  . Stage 3 chronic renal impairment associated with type 2 diabetes mellitus ([place]) 01/20/2014  . Acute on chronic systolic and diastolic heart failure, NYHA class 4 ([place]) 01/16/2014  . CAD S/P SVG-MO1/OM2 DES 12/11/13 12/13/2013  . Pacemaker implanted 12/12/13 (MDT) 12/13/2013  . Complete heart block ([place]) 12/13/2013  . HOH (hard of hearing) 12/13/2013  . Syncope 12/12/2013  . 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 Sick sinus syndrome ([place]) 12/10/2013  . PAF (paroxysmal atrial fibrillation) ([place]) 12/10/2013  . Tachy-brady syndrome ([place]) 12/10/2013  . Symptomatic bradycardia 12/07/2013  . Heart block 03/22/2013  . CAD (coronary artery disease) of artery bypass graft 03/23/2012  . Angina pectoris- rate related 03/23/2012  . Carotid bruit 03/23/2012  . CARDIAC MURMUR 12/03/2009  . Type 2 diabetes with nephropathy ([place]) 12/02/2009  .  HYPERCHOLESTEROLEMIA 12/02/2009  . ANEMIA 12/02/2009  . Essential hypertension 12/02/2009  . CABG '96 12/02/2009  . ALLERGIC RHINITIS 12/02/2009  . GERD 12/02/2009  . TRANSIENT ISCHEMIC ATTACKS, HX OF 12/02/2009  . NEPHROLITHIASIS, HX OF 12/02/2009    Orientation RESPIRATION BLADDER Height & Weight     Self, Time, Situation, Place  Normal Continent Weight: 156 lb 12.8 oz (71.1 kg) (c scale) Height:  5\' 7"  (170.2 cm)  BEHAVIORAL SYMPTOMS/MOOD NEUROLOGICAL BOWEL NUTRITION STATUS   (None)  (None) Incontinent Diet (Heart healthy. Fluid restriction: 1200 mL.)  AMBULATORY STATUS COMMUNICATION OF NEEDS Skin   Limited Assist Verbally Bruising                       Personal Care Assistance Level of Assistance  Bathing, Feeding, Dressing Bathing Assistance: Limited assistance Feeding assistance: Independent Dressing Assistance: Limited assistance     Functional Limitations Info  Sight, Hearing, Speech Sight Info: Adequate Hearing Info: Impaired Speech Info: Adequate    SPECIAL CARE FACTORS FREQUENCY  PT (By licensed PT), Blood pressure, OT (By licensed OT)     PT Frequency: 5 x week OT Frequency: 5 x week            Contractures Contractures Info: Not present    Additional Factors Info  Code Status, Allergies, Isolation Precautions Code Status Info: DNR Allergies Info: Clonidine Derivatives, Codeine, Meperidine Hcl, Motrin Ibuprofen, Other, Penicillins, Demerol, Heparin, Metformin And Related     Isolation Precautions Info: Enteric precautions     Current Medications (08/03/2016):  This is the current hospital active medication list  Current Facility-Administered Medications  Medication Dose Route Frequency Provider Last Rate Last Dose  . acetaminophen (TYLENOL) tablet 650 mg  650 mg Oral Q6H PRN Waldemar Dickens, MD       Or  . acetaminophen (TYLENOL) suppository 650 mg  650 mg Rectal Q6H PRN Waldemar Dickens, MD      . albuterol (PROVENTIL) (2.5 MG/3ML) 0.083%  nebulizer solution 2.5 mg  2.5 mg Nebulization Q2H PRN Waldemar Dickens, MD      . amiodarone (PACERONE) tablet 100 mg  100 mg Oral Daily Waldemar Dickens, MD   100 mg at 08/03/16 0459  . atorvastatin (LIPITOR) tablet 10 mg  10 mg Oral Daily Waldemar Dickens, MD   10 mg at 08/03/16 9774  . carvedilol (COREG) tablet 12.5 mg  12.5 mg Oral BID WC Waldemar Dickens, MD   12.5 mg at 08/03/16 0800  . clopidogrel (PLAVIX) tablet 75 mg  75 mg Oral Q breakfast Waldemar Dickens, MD   75 mg at 08/03/16 1423  . finasteride (PROSCAR) tablet 5 mg  5 mg Oral Daily Waldemar Dickens, MD   5 mg at 08/03/16 9532  . furosemide (LASIX) injection 80 mg  80 mg Intravenous BID Fay Records, MD   80 mg at 08/02/16 1716  . hydrALAZINE (APRESOLINE) injection 5-10 mg  5-10 mg Intravenous Q4H PRN Waldemar Dickens, MD      . insulin aspart (novoLOG) injection 0-5 Units  0-5 Units Subcutaneous QHS Waldemar Dickens, MD   2 Units at 08/01/16 2239  . insulin aspart (novoLOG) injection 0-9 Units  0-9 Units Subcutaneous TID WC Waldemar Dickens, MD   7 Units at 08/03/16 1222  . isosorbide mononitrate (IMDUR) 24 hr tablet 30 mg  30 mg Oral Daily Waldemar Dickens, MD   30 mg at 08/03/16 0233  . levothyroxine (SYNTHROID, LEVOTHROID) tablet 112 mcg  112 mcg Oral QAC breakfast Waldemar Dickens, MD   112 mcg at 08/03/16 4356  . loperamide (IMODIUM) capsule 2 mg  2 mg Oral BID PRN Vernell Leep D, MD      . nitroGLYCERIN (NITROSTAT) SL tablet 0.4 mg  0.4 mg Sublingual Q5 min PRN Waldemar Dickens, MD      . ondansetron [facility]) tablet 4 mg  4 mg Oral Q6H PRN Waldemar Dickens, MD       Or  . ondansetron [facility]) injection 4 mg  4 mg Intravenous Q6H PRN Waldemar Dickens, MD      . saccharomyces boulardii (FLORASTOR) capsule 250 mg  250 mg Oral BID Modena Jansky, MD   250 mg at 08/03/16 8616  . sacubitril-valsartan (ENTRESTO) 24-26 mg per tablet  1 tablet Oral BID Reino Bellis B, NP   1 tablet at 08/03/16 8372     Discharge  Medications: Please see discharge summary for a list of discharge medications.  Relevant Imaging Results:  Relevant Lab Results:   Additional Information SS#: [id number]. From [place].  Candie Chroman, LCSW

## 2016-08-03 NOTE — Progress Notes (Signed)
Physical Therapy Treatment Patient Details Name: Evan [name] MRN: [id number] DOB: [birth year] Today's Date: 08/03/2016    History of Present Illness Pt is a 83 y.o male admitted through ED on 07/30/16 with worsening dyspnea, orthopnea, chronic chest pain, and LE swelling. Pt was recently hospitalized 6/10-6/22 with acute lower GI bleed. Pt was diagnosed with acute on chronic CHF, Bilateral pleural effusions, DM2, CKD III, ARF with hypoxia, and HTN. PMH significant for CAD s/p CABG, stent, Dm2, chronic systolic CHF, cardiomyopathy, complete heart block s/p pacemaker, PAF, hypothyroid, HLD, HTN, anemia, GERD, and chronic chest pain.     PT Comments    Patient is making gradual progress toward mobility goals however does continue to demonstrate decreased strength and activity tolerance. SpO2 down to 88% on RA with mobility and up to 92% resting. Current plan remains appropriate.    Follow Up Recommendations  SNF     Equipment Recommendations  None recommended by PT    Recommendations for Other Services       Precautions / Restrictions Precautions Precautions: Fall Restrictions Weight Bearing Restrictions: No    Mobility  Bed Mobility               General bed mobility comments: pt sitting up in recliner when PT arrives  Transfers Overall transfer level: Needs assistance Equipment used: Rolling walker (2 wheeled) Transfers: Sit to/from Stand Sit to Stand: Min guard         General transfer comment: Min gaurd for safety    Ambulation/Gait Ambulation/Gait assistance: Min guard Ambulation Distance (Feet): 70 Feet Assistive device: Rolling walker (2 wheeled) Gait Pattern/deviations: Step-through pattern;Decreased stride length;Shuffle;Trunk flexed Gait velocity: decreased   General Gait Details: cues for posture and safe use of AD; pt with flexed trunk and decreased bilat step height; SpO2 down to 88% on RA   Stairs            Wheelchair Mobility     Modified Rankin (Stroke Patients Only)       Balance Overall balance assessment: Needs assistance Sitting-balance support: No upper extremity supported;Feet supported Sitting balance-Leahy Scale: Normal     Standing balance support: Single extremity supported;During functional activity Standing balance-Leahy Scale: Fair                              Cognition Arousal/Alertness: Awake/alert Behavior During Therapy: WFL for tasks assessed/performed Overall Cognitive Status: No family/caregiver present to determine baseline cognitive functioning Area of Impairment: Memory                     Memory: Decreased short-term memory                Exercises General Exercises - Lower Extremity Quad Sets: AROM;Both;5 reps Long Arc Quad: AROM;Both;10 reps Hip ABduction/ADduction: AROM;Both;5 reps Hip Flexion/Marching: AROM;Both;15 reps    General Comments        Pertinent Vitals/Pain Pain Assessment: No/denies pain    Home Living                      Prior Function            PT Goals (current goals can now be found in the care plan section) Acute Rehab PT Goals PT Goal Formulation: With patient Time For Goal Achievement: 08/09/16 Potential to Achieve Goals: Good Progress towards PT goals: Progressing toward goals    Frequency    Min 2X/week  PT Plan Current plan remains appropriate    Co-evaluation              AM-PAC PT "6 Clicks" Daily Activity  Outcome Measure  Difficulty turning over in bed (including adjusting bedclothes, sheets and blankets)?: None Difficulty moving from lying on back to sitting on the side of the bed? : None Difficulty sitting down on and standing up from a chair with arms (e.g., wheelchair, bedside commode, etc,.)?: Total Help needed moving to and from a bed to chair (including a wheelchair)?: A Little Help needed walking in hospital room?: A Little Help needed climbing 3-5 steps with a  railing? : A Little 6 Click Score: 18    End of Session Equipment Utilized During Treatment: Gait belt Activity Tolerance: Patient tolerated treatment well Patient left: in chair;with call bell/phone within reach;with chair alarm set Nurse Communication: Mobility status PT Visit Diagnosis: Unsteadiness on feet (R26.81);Muscle weakness (generalized) (M62.81);Difficulty in walking, not elsewhere classified (R26.2)     Time: 1121-6244 PT Time Calculation (min) (ACUTE ONLY): 24 min  Charges:  $Gait Training: 8-22 mins $Therapeutic Exercise: 8-22 mins                    G Codes:       Earney Navy, PTA Pager: [phone]     Darliss Cheney 08/03/2016, 4:41 PM

## 2016-08-04 DIAGNOSIS — L8990 Pressure ulcer of unspecified site, unspecified stage: Secondary | ICD-10-CM | POA: Insufficient documentation

## 2016-08-04 LAB — GLUCOSE, CAPILLARY
GLUCOSE-CAPILLARY: 199 mg/dL — AB (ref 65–99)
GLUCOSE-CAPILLARY: 282 mg/dL — AB (ref 65–99)
Glucose-Capillary: 177 mg/dL — ABNORMAL HIGH (ref 65–99)
Glucose-Capillary: 288 mg/dL — ABNORMAL HIGH (ref 65–99)

## 2016-08-04 LAB — BASIC METABOLIC PANEL
ANION GAP: 12 (ref 5–15)
BUN: 29 mg/dL — ABNORMAL HIGH (ref 6–20)
CALCIUM: 8.2 mg/dL — AB (ref 8.9–10.3)
CO2: 22 mmol/L (ref 22–32)
Chloride: 105 mmol/L (ref 101–111)
Creatinine, Ser: 1.74 mg/dL — ABNORMAL HIGH (ref 0.61–1.24)
GFR, EST AFRICAN AMERICAN: 38 mL/min — AB (ref >60)
GFR, EST NON AFRICAN AMERICAN: 33 mL/min — AB (ref >60)
GLUCOSE: 171 mg/dL — AB (ref 65–99)
POTASSIUM: 3.6 mmol/L (ref 3.5–5.1)
Sodium: 139 mmol/L (ref 135–145)

## 2016-08-04 MED ORDER — LEVOTHYROXINE SODIUM 112 MCG PO TABS
112.0000 ug | ORAL_TABLET | Freq: Every day | ORAL | Status: DC
  Administered 2016-08-04 – 2016-08-05 (×2): 112 ug via ORAL
  Filled 2016-08-04 (×2): qty 1

## 2016-08-04 MED ORDER — FUROSEMIDE 80 MG PO TABS
80.0000 mg | ORAL_TABLET | Freq: Every day | ORAL | Status: DC
  Administered 2016-08-04 – 2016-08-05 (×2): 80 mg via ORAL
  Filled 2016-08-04 (×2): qty 1

## 2016-08-04 NOTE — Clinical Social Work Note (Addendum)
Received call from pre-service coordinator with [facility]. They are just waiting on OT evaluation to review for insurance authorization.  Dayton Scrape, CSW [phone]  12:25 pm CSW updated patient's daughter by phone.  Dayton Scrape, [place] [phone]  12:58 pm CSW paged OT.  Dayton Scrape, CSW [phone]  1:31 pm No response. CSW paged OT again.  Dayton Scrape, CSW [phone]  3:29 pm CSW faxed OT eval to [facility]. Patient's daughter updated. Patient will need PTAR.  Dayton Scrape, CSW [phone]  3:59 pm CSW spoke with MD. If we do not have insurance authorization by 4:30 pm, discharge will be postponed until tomorrow due to high influx of patients in the ED around that time. CSW spoke with pre-service coordinator at [facility] who stated she was not sure if we would have authorization by then. CSW updated both patient's daughter and SNF admissions coordinator.  Dayton Scrape, [place]

## 2016-08-04 NOTE — Progress Notes (Signed)
PROGRESS NOTE   Evan [name]  IWP:[id number]    DOB: [birth year]    DOA: 07/30/2016  PCP: Deland Pretty, MD   I have briefly reviewed patients previous medical records in [facility].  Brief Narrative:  83 year old male, lives at an independent living facility, ambulates with the help of a walker, not on home oxygen, PMH of CAD status post CABG, stent, DM 2, chronic systolic CHF, cardiomyopathy, complete heart block status post pacemaker, PAF on amiodarone, hypothyroid, HLD, HTN, iron deficiency anemia, GERD, chronic chest pain, presented with a several days history of progressively worsening dyspnea, orthopnea, no change in chronic chest pain, worsening lower extremity swelling but weight stable at 155 pounds. He was hospitalized 6/19-6/22 for acute lower GI bleed, EGD and colonoscopy were deferred due to high cardiac risk, ABLA, type 2 NSTEMI. He also reports a couple of episodes of diarrhea. Chest x-ray and clinical picture consistent with decompensated CHF. Echo shows worsening EF. Cardiology consulted.CHF improving. Creatinine increased. IV Lasix stopped 7/30. Resumed oral Lasix 7/31. Follow BMP in a.m. and possible DC to SNF 08/05/16 pending insurance authorization..   Assessment & Plan:   Principal Problem:   Acute on chronic systolic and diastolic heart failure, NYHA class 4 (HCC) Active Problems:   HYPERCHOLESTEROLEMIA   Essential hypertension   CABG '96   GERD   PAF (paroxysmal atrial fibrillation) ([place])   Pacemaker implanted 12/12/13 (MDT)   Complete heart block (HCC)   Stage 3 chronic renal impairment associated with type 2 diabetes mellitus ([place])   Atypical chest pain   Acute respiratory failure with hypoxia (HCC)   Pressure injury of skin   1. Acute on chronic combined CHF: Echo this admission shows worsening of EF from 45-50 percent in 2015 to 25-30 percent. Cardiology was consulted and assisted with management. Amlodipine stopped. Entresto started. Continue  carvedilol, atorvastatin, Plavix and Imdur. Pro-calcitonin very low, low index of suspicion for pneumonia. Chest x-ray 7/29 and reviewed, improving, mild pulmonary edema. Patient was continued on high-dose IV Lasix. Clinically improved. Orthopnea resolved. Creatinine has increased from 1.58-1.75. IV Lasix was stopped. Creatinine has plateaued at 1.7. Intake output has been inaccurate. Weight down by 6 pounds. Started oral Lasix 80 mg daily on 7/31. Follow BMP in a.m. As discussed with Cardiology, stable for discharge on oral Lasix in a.m. pending stable creatinine and they will arrange outpatient follow-up. 2. Cardiomyopathy: Worsening EF as stated above. Likely ischemic cardiomyopathy. As per cardiology input, medical management for now. Stable. 3. CAD status post CABG, chronic stable angina: Medical management for now. Continue carvedilol, Imdur, Plavix and statin. No chest pain since admission. Stable. 4. Paroxysmal A. fib: AV paced rhythm. Amiodarone. Not anticoagulation candidate due to GI bleed. Stable. 5. Bilateral pleural effusions: Likely due to CHF. Diuresis and follow chest x-ray periodically. Improving. Stable. 6. Diarrhea: Unclear etiology. C. difficile antigen positive, toxin negative, PCR negative. GI pathogen panel PCR negative. Do not suspect fecal impaction. Added probiotics. Apparently has some normal stools and at times loose stools. May consider outpatient GI follow-up. 7. Microcytic Anemia/iron deficiency: Recent GI bleed that required blood transfusion. Hemoglobin probably in the low 9 g range. No overt bleeding. No melena reported. Anemia panel: Iron 21, ferritin 29, folate 11.7 and B12: 542. Iron supplements. Hemoglobin stable. Outpatient follow-up. 8. Hypokalemia: Replaced. Magnesium 2.1.  9. Type II DM: Mildly uncontrolled and fluctuating. Continue SSI.  10. Essential hypertension: Controlled. Stable. 11. Acute on Stage III chronic kidney disease: Baseline creatinine probably in  the 1.4 range. Creatinine had worsened from 1.58-1.75, likely related to diuresis and IV Lasix was stopped. Creatinine has plateaued at 1.7. Oral Lasix started today. Follow BMP in a.m. 12. Acute respiratory failure with hypoxia: Due to CHF and pleural effusions. Treated underlying cause. Hypoxia resolved. 13. Insomnia: Received a dose of Benadryl last night and apparently slept well but had some confusion. As per daughter, takes melatonin at home which can be included on discharge medications to SNF.   DVT prophylaxis: SCDs Code Status: DO NOT RESUSCITATE Family Communication: Discussed with daughter, updated care and answered questions. Disposition: DC SNF likely 08/05/16 pending insurance authorization and stability of renal functions.   Consultants:  Cardiology   Procedures:  2-D echo 07/30/16: Study Conclusions  - Left ventricle: The cavity size was normal. Systolic function was   severely reduced. The estimated ejection fraction was in the   range of 25% to 30%. Severe diffuse hypokinesis with no   identifiable regional variations. There is substantial systolic   dyssynchrony, both septal-to-lateral wall and apex-to-base.   Doppler parameters are consistent with restrictive physiology,   indicative of decreased left ventricular diastolic compliance   and/or increased left atrial pressure. Acoustic contrast   opacification revealed no evidence ofthrombus. - Ventricular septum: Septal motion showed abnormal function,   dyssynergy, and paradox. These changes are consistent with right   ventricular pacing. - Aortic valve: Noncoronary cusp mobility was severely restricted.   Transvalvular velocity was increased less than expected, due to   low cardiac output. There was mild stenosis. There was mild   regurgitation directed centrally in the LVOT. Valve area (VTI):   1.41 cm^2. - Mitral valve: Calcified annulus. There was mild to moderate   regurgitation directed centrally. - Left  atrium: The atrium was moderately dilated. - Right ventricle: Systolic function was mildly reduced. - Tricuspid valve: There was moderate regurgitation. - Pulmonary arteries: Systolic pressure was moderately increased.   PA peak pressure: 63 mm Hg (S).  Antimicrobials:  None    Subjective: States that he slept well last night. No dyspnea, chest pain reported. States that some days he has normal formed stools and at times has loose stools but not watery as on admission. No abdominal pain. As per RN, had some confusion overnight, likely due to Benadryl.  ROS: Denies dizziness, lightheadedness or palpitations. No cough, fever or chills reported. No change.  Objective:  Vitals:   08/03/16 1258 08/03/16 2000 08/04/16 0552 08/04/16 1204  BP: (!) 117/36 (!) 100/53 130/82 (!) 122/40  Pulse: (!) 59 60 60 (!) 59  Resp: 16 18 16 16   Temp: 98.1 F (36.7 C) 98.9 F (37.2 C) 98.9 F (37.2 C) 97.6 F (36.4 C)  TempSrc: Oral Oral Oral Oral  SpO2: 98% 99% 99% 99%  Weight:   68.4 kg (150 lb 12.7 oz)   Height:        Examination:  General exam: Pleasant elderly male, moderately built and frail, chronically ill-looking, sitting up comfortably in chair. Extremely hard of hearing. Uses right hearing aid. Respiratory system: Clear to auscultation. No increased work of breathing. Cardiovascular system: S1 & S2 heard, RRR. No JVD, murmurs, rubs, gallops or clicks. Trace pedal edema & has mild sacral edema. Telemetry: AV paced rhythm. Stable without change. Gastrointestinal system: Abdomen is nondistended, soft and nontender. No organomegaly or masses felt. Normal bowel sounds heard. Stable Central nervous system: Alert and oriented. No focal neurological deficits. Stable Extremities: Symmetric 5 x 5 power. Skin: No rashes, lesions  or ulcers Psychiatry: Judgement and insight appear normal. Mood & affect appropriate.     Data Reviewed: I have personally reviewed following labs and imaging  studies  CBC:  Recent Labs Lab 07/30/16 0852 07/31/16 0340 08/01/16 0547 08/02/16 0504  WBC 6.5 6.8 6.8 6.1  NEUTROABS 5.6  --   --   --   HGB 9.0* 8.6* 8.2* 8.8*  HCT 28.9* 27.7* 27.8* 29.2*  MCV 76.9* 76.1* 77.0* 76.6*  PLT 216 238 244 371   Basic Metabolic Panel:  Recent Labs Lab 07/30/16 1617 07/31/16 0340 08/01/16 0547 08/02/16 0504 08/03/16 0727 08/04/16 0410  NA  --  140 140 138 139 139  K  --  3.2* 3.4* 4.0 3.8 3.6  CL  --  106 104 105 105 105  CO2  --  23 25 21* 23 22  GLUCOSE  --  161* 131* 140* 137* 171*  BUN  --  20 22* 27* 29* 29*  CREATININE  --  1.55* 1.65* 1.58* 1.75* 1.74*  CALCIUM  --  8.0* 8.1* 8.4* 8.2* 8.2*  MG 2.1  --   --   --   --   --   PHOS 3.7  --   --   --   --   --    Liver Function Tests:  Recent Labs Lab 07/30/16 0852  AST 16  ALT 13*  ALKPHOS 65  BILITOT 1.4*  PROT 6.2*  ALBUMIN 3.5   Coagulation Profile: No results for input(s): INR, PROTIME in the last 168 hours. Cardiac Enzymes:  Recent Labs Lab 07/30/16 0852 07/30/16 1617  TROPONINI 0.03* 0.04*   HbA1C: No results for input(s): HGBA1C in the last 72 hours. CBG:  Recent Labs Lab 08/03/16 1633 08/03/16 2103 08/04/16 0738 08/04/16 1138 08/04/16 1630  GLUCAP 128* 189* 177* 199* 282*    Recent Results (from the past 240 hour(s))  Gastrointestinal Panel by PCR , Stool     Status: None   Collection Time: 07/30/16  1:35 PM  Result Value Ref Range Status   Campylobacter species NOT DETECTED NOT DETECTED Final   Plesimonas shigelloides NOT DETECTED NOT DETECTED Final   Salmonella species NOT DETECTED NOT DETECTED Final   Yersinia enterocolitica NOT DETECTED NOT DETECTED Final   Vibrio species NOT DETECTED NOT DETECTED Final   Vibrio cholerae NOT DETECTED NOT DETECTED Final   Enteroaggregative E coli (EAEC) NOT DETECTED NOT DETECTED Final   Enteropathogenic E coli (EPEC) NOT DETECTED NOT DETECTED Final   Enterotoxigenic E coli (ETEC) NOT DETECTED NOT  DETECTED Final   Shiga like toxin producing E coli (STEC) NOT DETECTED NOT DETECTED Final   Shigella/Enteroinvasive E coli (EIEC) NOT DETECTED NOT DETECTED Final   Cryptosporidium NOT DETECTED NOT DETECTED Final   Cyclospora cayetanensis NOT DETECTED NOT DETECTED Final   Entamoeba histolytica NOT DETECTED NOT DETECTED Final   Giardia lamblia NOT DETECTED NOT DETECTED Final   Adenovirus F40/41 NOT DETECTED NOT DETECTED Final   Astrovirus NOT DETECTED NOT DETECTED Final   Norovirus GI/GII NOT DETECTED NOT DETECTED Final   Rotavirus A NOT DETECTED NOT DETECTED Final   Sapovirus (I, II, IV, and V) NOT DETECTED NOT DETECTED Final  C difficile quick scan w PCR reflex     Status: Abnormal   Collection Time: 07/30/16  1:35 PM  Result Value Ref Range Status   C Diff antigen POSITIVE (A) NEGATIVE Final   C Diff toxin NEGATIVE NEGATIVE Final   C Diff interpretation Results are indeterminate.  See PCR results.  Final  Clostridium Difficile by PCR     Status: None   Collection Time: 07/30/16  1:35 PM  Result Value Ref Range Status   Toxigenic C Difficile by pcr NEGATIVE NEGATIVE Final    Comment: Patient is colonized with non toxigenic C. difficile. May not need treatment unless significant symptoms are present.         Radiology Studies: No results found.      Scheduled Meds: . amiodarone  100 mg Oral Daily  . atorvastatin  10 mg Oral Daily  . carvedilol  12.5 mg Oral BID WC  . clopidogrel  75 mg Oral Q breakfast  . finasteride  5 mg Oral Daily  . furosemide  80 mg Oral Daily  . insulin aspart  0-5 Units Subcutaneous QHS  . insulin aspart  0-9 Units Subcutaneous TID WC  . isosorbide mononitrate  30 mg Oral Daily  . levothyroxine  112 mcg Oral QAC breakfast  . saccharomyces boulardii  250 mg Oral BID  . sacubitril-valsartan  1 tablet Oral BID   Continuous Infusions:    LOS: 4 days     Ryelan Kazee, MD, FACP, FHM. [facility] Pager [phone]  If 7PM-7AM,  please contact night-coverage [email or web address] Password [facility] 08/04/2016, 4:34 PM

## 2016-08-04 NOTE — Progress Notes (Signed)
I cosign Jennifer Stephens, RN's assessment, care plan, education, medication administration, and intake/output. 

## 2016-08-04 NOTE — Progress Notes (Signed)
Talked to social work, she stated awaiting insurance response, pt may be discharged tomorrow. Social worker stated family and MD aware

## 2016-08-04 NOTE — Evaluation (Signed)
Occupational Therapy Evaluation Patient Details Name: Evan [name] MRN: [id number] DOB: [birth year] Today's Date: 08/04/2016    History of Present Illness Pt is a 83 y.o male admitted through ED on 07/30/16 with worsening dyspnea, orthopnea, chronic chest pain, and LE swelling. Pt was recently hospitalized 6/10-6/22 with acute lower GI bleed. Pt was diagnosed with acute on chronic CHF, Bilateral pleural effusions, DM2, CKD III, ARF with hypoxia, and HTN. PMH significant for CAD s/p CABG, stent, Dm2, chronic systolic CHF, cardiomyopathy, complete heart block s/p pacemaker, PAF, hypothyroid, HLD, HTN, anemia, GERD, and chronic chest pain.    Clinical Impression   This 83 y/o M presents with the above. At baseline Pt is mod independent with ADLs and functional mobility, was living in an ILF. Pt currently requires minA for functional mobility using RW and MinA-ModA for LB ADLs. Pt will benefit from continued OT services in acute and post acute settings to maximize Pt's safety and independence with ADLs and functional mobility prior to returning home.     Follow Up Recommendations  SNF;Supervision/Assistance - 24 hour    Equipment Recommendations  Other (comment) (TBD in next venue of care )           Precautions / Restrictions Precautions Precautions: Fall Restrictions Weight Bearing Restrictions: No      Mobility Bed Mobility               General bed mobility comments: Pt up in recliner upon arrival   Transfers Overall transfer level: Needs assistance Equipment used: Rolling walker (2 wheeled) Transfers: Sit to/from Stand Sit to Stand: Min guard         General transfer comment: min guard for safety     Balance Overall balance assessment: Needs assistance Sitting-balance support: No upper extremity supported;Feet supported Sitting balance-Leahy Scale: Normal     Standing balance support: During functional activity;No upper extremity supported;Bilateral upper  extremity supported Standing balance-Leahy Scale: Fair Standing balance comment: Pt stands while washing hands at sink                            ADL either performed or assessed with clinical judgement   ADL Overall ADL's : Needs assistance/impaired Eating/Feeding: Set up;Sitting   Grooming: Wash/dry hands;Min guard;Standing   Upper Body Bathing: Min guard;Sitting   Lower Body Bathing: Minimal assistance;Sit to/from stand   Upper Body Dressing : Min guard;Sitting   Lower Body Dressing: Moderate assistance;Sit to/from stand Lower Body Dressing Details (indicate cue type and reason): Pt demonstrates figure 4 technique  Toilet Transfer: Ambulation;Comfort height toilet;Grab bars;RW;Min guard   Toileting- Clothing Manipulation and Hygiene: Minimal assistance;Sit to/from stand Toileting - Clothing Manipulation Details (indicate cue type and reason): assist for gown management      Functional mobility during ADLs: Min guard;Rolling walker       Vision Baseline Vision/History: Wears glasses Wears Glasses: At all times                  Pertinent Vitals/Pain Pain Assessment: Faces Faces Pain Scale: No hurt     Hand Dominance Right   Extremity/Trunk Assessment Upper Extremity Assessment Upper Extremity Assessment: Generalized weakness   Lower Extremity Assessment Lower Extremity Assessment: Defer to PT evaluation   Cervical / Trunk Assessment Cervical / Trunk Assessment: Kyphotic   Communication Communication Communication: No difficulties;HOH   Cognition Arousal/Alertness: Awake/alert Behavior During Therapy: WFL for tasks assessed/performed Overall Cognitive Status: No family/caregiver present to determine baseline  cognitive functioning                                                      Home Living Family/patient expects to be discharged to:: Private residence ([place]) Living Arrangements: Alone Available  Help at Discharge: Other (Comment) (no consistent assistance ) Type of Home: Independent living facility Home Access: Elevator     Home Layout: One level     Bathroom Shower/Tub: Occupational psychologist: Handicapped height Bathroom Accessibility: Yes   Home Equipment: Environmental consultant - 4 wheels;Shower seat - built in;Grab bars - toilet;Grab bars - tub/shower;Hand held shower head          Prior Functioning/Environment Level of Independence: Independent with assistive device(s)        Comments: pt doesn't use assistive device around apartment but does use walker for ambulating in facility, ambulates to dining hall        OT Problem List: Decreased strength;Decreased activity tolerance      OT Treatment/Interventions: Self-care/ADL training;DME and/or AE instruction;Therapeutic activities;Balance training;Therapeutic exercise;Energy conservation;Patient/family education    OT Goals(Current goals can be found in the care plan section) Acute Rehab OT Goals Patient Stated Goal: to either have help at home or go SNF OT Goal Formulation: With patient Time For Goal Achievement: 08/18/16 Potential to Achieve Goals: Good  OT Frequency: Min 2X/week   Barriers to D/C:                          AM-PAC PT "6 Clicks" Daily Activity     Outcome Measure Help from another person eating meals?: None Help from another person taking care of personal grooming?: A Little Help from another person toileting, which includes using toliet, bedpan, or urinal?: A Little Help from another person bathing (including washing, rinsing, drying)?: A Lot Help from another person to put on and taking off regular upper body clothing?: A Little Help from another person to put on and taking off regular lower body clothing?: A Lot 6 Click Score: 17   End of Session Equipment Utilized During Treatment: Rolling walker Nurse Communication: Mobility status  Activity Tolerance: Patient tolerated  treatment well Patient left: in chair;with call bell/phone within reach;with chair alarm set  OT Visit Diagnosis: Unsteadiness on feet (R26.81);Muscle weakness (generalized) (M62.81)                Time: 1342-1400 OT Time Calculation (min): 18 min Charges:  OT General Charges $OT Visit: 1 Procedure OT Evaluation $OT Eval Low Complexity: 1 Procedure G-Codes:     Lou Cal, OT Pager [phone] 08/04/2016  Raymondo Band 08/04/2016, 2:10 PM

## 2016-08-04 NOTE — Progress Notes (Signed)
Progress Note  Patient Name: [name] Date of Encounter: 08/04/2016  Primary Cardiologist: Nishan/Allred  Subjective   Another episode of diarrhea this morning. Breathing overall has significantly improved.   Inpatient Medications    Scheduled Meds: . amiodarone  100 mg Oral Daily  . atorvastatin  10 mg Oral Daily  . carvedilol  12.5 mg Oral BID WC  . clopidogrel  75 mg Oral Q breakfast  . finasteride  5 mg Oral Daily  . insulin aspart  0-5 Units Subcutaneous QHS  . insulin aspart  0-9 Units Subcutaneous TID WC  . isosorbide mononitrate  30 mg Oral Daily  . levothyroxine  112 mcg Oral QAC breakfast  . saccharomyces boulardii  250 mg Oral BID  . sacubitril-valsartan  1 tablet Oral BID   Continuous Infusions:  PRN Meds: acetaminophen **OR** acetaminophen, albuterol, diphenhydrAMINE, hydrALAZINE, loperamide, nitroGLYCERIN, ondansetron **OR** ondansetron (ZOFRAN) IV   Vital Signs    Vitals:   08/03/16 0505 08/03/16 1258 08/03/16 2000 08/04/16 0552  BP: (!) 122/54 (!) 117/36 (!) 100/53 130/82  Pulse: 61 (!) 59 60 60  Resp:  16 18 16   Temp: 98.7 F (37.1 C) 98.1 F (36.7 C) 98.9 F (37.2 C) 98.9 F (37.2 C)  TempSrc: Oral Oral Oral Oral  SpO2: 98% 98% 99% 99%  Weight: 156 lb 12.8 oz (71.1 kg)   150 lb 12.7 oz (68.4 kg)  Height:        Intake/Output Summary (Last 24 hours) at 08/04/16 1057 Last data filed at 08/04/16 1040  Gross per 24 hour  Intake              120 ml  Output              200 ml  Net              -80 ml   Filed Weights   08/02/16 0500 08/03/16 0505 08/04/16 0552  Weight: 151 lb 3.2 oz (68.6 kg) 156 lb 12.8 oz (71.1 kg) 150 lb 12.7 oz (68.4 kg)    Telemetry    AV paced - Personally Reviewed  ECG    N/a - Personally Reviewed  Physical Exam   General: Well developed, well nourished, pale older W male appearing in no acute distress. Head: Normocephalic, atraumatic.  Neck: Supple without bruits, JVD. Lungs:  Resp regular and  unlabored, diminished bilaterally in lower lobes. Heart: RRR, S1, S2, no S3, S4, 4/6 systolic murmur; no rub. Abdomen: Soft, non-tender, non-distended with normoactive bowel sounds. No hepatomegaly. No rebound/guarding. No obvious abdominal masses. Extremities: No clubbing, cyanosis, 1+ edema. Distal pedal pulses are 2+ bilaterally. Neuro: Alert and oriented X 3. Moves all extremities spontaneously. Psych: Normal affect.  Labs    Chemistry Recent Labs Lab 07/30/16 [phone]  08/02/16 0504 08/03/16 0727 08/04/16 0410  NA 137  < > 138 139 139  K 3.5  < > 4.0 3.8 3.6  CL 103  < > 105 105 105  CO2 21*  < > 21* 23 22  GLUCOSE 207*  < > 140* 137* 171*  BUN 23*  < > 27* 29* 29*  CREATININE 1.83*  < > 1.58* 1.75* 1.74*  CALCIUM 8.6*  < > 8.4* 8.2* 8.2*  PROT 6.2*  --   --   --   --   ALBUMIN 3.5  --   --   --   --   AST 16  --   --   --   --  ALT 13*  --   --   --   --   ALKPHOS 65  --   --   --   --   BILITOT 1.4*  --   --   --   --   GFRNONAA 31*  < > 37* 33* 33*  GFRAA 36*  < > 43* 38* 38*  ANIONGAP 13  < > 12 11 12   < > = values in this interval not displayed.   Hematology Recent Labs Lab 07/31/16 0340 08/01/16 0547 08/02/16 0504  WBC 6.8 6.8 6.1  RBC 3.64* 3.61* 3.81*  HGB 8.6* 8.2* 8.8*  HCT 27.7* 27.8* 29.2*  MCV 76.1* 77.0* 76.6*  MCH 23.6* 22.7* 23.1*  MCHC 31.0 29.5* 30.1  RDW 16.9* 17.1* 17.7*  PLT 238 244 215    Cardiac Enzymes Recent Labs Lab 07/30/16 0852 07/30/16 1617  TROPONINI 0.03* 0.04*   No results for input(s): TROPIPOC in the last 168 hours.   BNP Recent Labs Lab 07/30/16 0852  BNP 1,276.9*     DDimer No results for input(s): DDIMER in the last 168 hours.    Radiology    No results found.  Cardiac Studies   TTE: 07/30/16  Study Conclusions  - Left ventricle: The cavity size was normal. Systolic function was severely reduced. The estimated ejection fraction was in the range of 25% to 30%. Severe diffuse hypokinesis with  no identifiable regional variations. There is substantial systolic dyssynchrony, both septal-to-lateral wall and apex-to-base. Doppler parameters are consistent with restrictive physiology, indicative of decreased left ventricular diastolic compliance and/or increased left atrial pressure. Acoustic contrast opacification revealed no evidence ofthrombus. - Ventricular septum: Septal motion showed abnormal function, dyssynergy, and paradox. These changes are consistent with right ventricular pacing. - Aortic valve: Noncoronary cusp mobility was severely restricted. Transvalvular velocity was increased less than expected, due to low cardiac output. There was mild stenosis. There was mild regurgitation directed centrally in the LVOT. Valve area (VTI): 1.41 cm^2. - Mitral valve: Calcified annulus. There was mild to moderate regurgitation directed centrally. - Left atrium: The atrium was moderately dilated. - Right ventricle: Systolic function was mildly reduced. - Tricuspid valve: There was moderate regurgitation. - Pulmonary arteries: Systolic pressure was moderately increased. PA peak pressure: 63 mm Hg (S).  Patient Profile     83 y.o. male with past medical history of CAD s/p CABG 5 vessel (1996), s/p DES to SVG -->OM 1/2 (2015), diabetes, chronic systolic heart failure, PVD, hypothyroidism, complete heart block status post pacemaker, PAF and PAT on amiodarone who presented with shortness of breath and found to have newly reduced EF. Diuresing with IV lasix.   Assessment & Plan    1. Acute on Chronic combined HF: Echo this admission showed worsening LV function of 25-30% with elevated BNP on admission. Diuresed with IV lasix. Overall much improved, with weight trending down. Cr/BUN stable today. Would resume oral dosing at 80mg  daily. Follow up Cr in the am. -- on medical therapy with ASA, Plavix, BB, Imdur and Entresto  2. CAD s/p CABG 5v: No reports of  chest pain. Last cath in 2015 with 5/5 patent grafts but DES placed to SVG--OM for ISR. Suspect fall in EF related to worsening CAD. Planned for medical therapy at this time. On good medications.   3. CKD stage II: Baseline Cr around 1.5, BUN/Cr stable at 1.7 today. Resume oral lasix at 80mg  daily.  -- BMET in the am  4. Bilateral pleural effusions: Improving with  diuresis on CXR from 7/29.   5. Anemia: stable at 8.8  Signed, Reino Bellis, NP  08/04/2016, 10:57 AM    I have examined the patient and reviewed assessment and plan and discussed with patient.  Agree with above as stated.  Sitting in a chair comfortably.  Agree with starting Lasix 80 mg daily.  Follow renal function.  Larae Grooms

## 2016-08-05 LAB — BASIC METABOLIC PANEL
Anion gap: 11 (ref 5–15)
BUN: 25 mg/dL — AB (ref 6–20)
CO2: 24 mmol/L (ref 22–32)
CREATININE: 1.77 mg/dL — AB (ref 0.61–1.24)
Calcium: 8.1 mg/dL — ABNORMAL LOW (ref 8.9–10.3)
Chloride: 104 mmol/L (ref 101–111)
GFR calc Af Amer: 37 mL/min — ABNORMAL LOW (ref >60)
GFR, EST NON AFRICAN AMERICAN: 32 mL/min — AB (ref >60)
GLUCOSE: 162 mg/dL — AB (ref 65–99)
POTASSIUM: 3.4 mmol/L — AB (ref 3.5–5.1)
SODIUM: 139 mmol/L (ref 135–145)

## 2016-08-05 LAB — GLUCOSE, CAPILLARY
GLUCOSE-CAPILLARY: 164 mg/dL — AB (ref 65–99)
Glucose-Capillary: 247 mg/dL — ABNORMAL HIGH (ref 65–99)

## 2016-08-05 MED ORDER — AMIODARONE HCL 100 MG PO TABS: 100.0000 mg | ORAL_TABLET | Freq: Every day | ORAL | 0 refills | Status: DC

## 2016-08-05 MED ORDER — JANUVIA 50 MG PO TABS: 25.0000 mg | ORAL_TABLET | Freq: Every day | ORAL | 2 refills | Status: DC

## 2016-08-05 MED ORDER — SACUBITRIL-VALSARTAN 24-26 MG PO TABS: 1.0000 | ORAL_TABLET | Freq: Two times a day (BID) | ORAL | 0 refills | Status: DC

## 2016-08-05 MED ORDER — POTASSIUM CHLORIDE CRYS ER 20 MEQ PO TBCR
40.0000 meq | EXTENDED_RELEASE_TABLET | Freq: Once | ORAL | Status: AC
  Administered 2016-08-05: 40 meq via ORAL
  Filled 2016-08-05: qty 2

## 2016-08-05 NOTE — Clinical Social Work Note (Signed)
CSW facilitated patient discharge including contacting patient family and facility to confirm patient discharge plans. Clinical information faxed to facility and family agreeable with plan. CSW arranged ambulance transport via [facility] to [facility]. RN to call report prior to discharge ([phone]).  CSW will sign off for now as social work intervention is no longer needed. Please consult us again if new needs arise.  Xachary Hambly, CSW [phone]   

## 2016-08-05 NOTE — Discharge Summary (Addendum)
Physician Discharge Summary  Evan [name] JKD:[id number] DOB: 1923/01/10 DOA: 07/30/2016  PCP: Deland Pretty, MD  Admit date: 07/30/2016 Discharge date: 08/05/2016  Time spent: > 35 minutes  Recommendations for Outpatient Follow-up:  1. Monitor potassium levels 2. Will reduce Januvia dose 25 mg by mouth daily. Please monitor blood sugars and adjust hypoglycemic agents accordingly 3. Ensure follow-up with cardiology (last seen by Dr. Irish Lack) 4. If continue diarrhea consider GI evaluation as outpatient.   Discharge Diagnoses:  Principal Problem:   Acute on chronic systolic and diastolic heart failure, NYHA class 4 (HCC) Active Problems:   HYPERCHOLESTEROLEMIA   Essential hypertension   CABG '96   GERD   PAF (paroxysmal atrial fibrillation) (HCC)   Pacemaker implanted 12/12/13 (MDT)   Complete heart block (HCC)   Stage 3 chronic renal impairment associated with type 2 diabetes mellitus ([place])   Atypical chest pain   Acute respiratory failure with hypoxia [facility])    Discharge Condition: stable  Diet recommendation: diabetic diet  Filed Weights   08/03/16 0505 08/04/16 0552 08/05/16 0428  Weight: 71.1 kg (156 lb 12.8 oz) 68.4 kg (150 lb 12.7 oz) 70.5 kg (155 lb 8 oz)    History of present illness:  From prior PN 83 year old male, lives at an independent living facility, ambulates with the help of a walker, not on home oxygen, PMH of CAD status post CABG, stent, DM 2, chronic systolic CHF, cardiomyopathy, complete heart block status post pacemaker, PAF on amiodarone, hypothyroid, HLD, HTN, iron deficiency anemia, GERD, chronic chest pain, presented with a several days history of progressively worsening dyspnea, orthopnea, no change in chronic chest pain, worsening lower extremity swelling but weight stable at 155 pounds. He was hospitalized 6/19-6/22 for acute lower GI bleed, EGD and colonoscopy were deferred due to high cardiac risk, ABLA, type 2 NSTEMI. He also reports a couple  of episodes of diarrhea. Chest x-ray and clinical picture consistent with decompensated CHF. Echo shows worsening EF. Cardiology consulted. CHF improving in cardiology who feels it is secondary to worsening CAD which will be medically managed. Creatinine increased. IV Lasix stopped 7/30. Resumed oral Lasix 7/31.   Hospital Course:  1. Acute on chronic combined CHF: Echo this admission shows worsening of EF from 45-50 percent in 2015 to 25-30 percent. Cardiology was consulted and assisted with management. Amlodipine stopped. Entresto started this admission. Continue carvedilol, atorvastatin, Plavix and Imdur. Pro-calcitonin very low, low index of suspicion for pneumonia. Chest x-ray 7/29 and reviewed, improving, mild pulmonary edema. Patient was continued on high-dose IV Lasix. Clinically improved. Orthopnea resolved. Creatinine has increased from 1.58-1.75. IV Lasix was stopped. Creatinine has plateaued at 1.7.  Weight down by 6 pounds. Started oral Lasix 80 mg daily on 7/31. Serum creatinine stable despite patient being on oral Lasix 2. Cardiomyopathy: Worsening EF as stated above. Presumed to be secondary to worsening cad per cardiology notes. Medical management recommended.  3. CAD status post CABG, chronic stable angina: Medical management for now. Continue carvedilol, Imdur, Plavix and statin. No chest pain since admission. Stable. 4. Paroxysmal A. fib: AV paced rhythm. Amiodarone. Not anticoagulation candidate due to GI bleed. Stable. 5. Bilateral pleural effusions: Likely due to CHF. Diuresis and follow chest x-ray periodically. Improving. Stable. 6. Diarrhea: Unclear etiology. C. difficile antigen positive, toxin negative, PCR negative. GI pathogen panel PCR negative. Do not suspect fecal impaction. Added probiotics. Apparently has some normal stools and at times loose stools. May consider outpatient GI follow-up. 7. Microcytic Anemia/iron deficiency: Recent GI  bleed that required blood transfusion.  Hemoglobin probably in the low 9 g range. No overt bleeding. No melena reported. Anemia panel: Iron 21, ferritin 29, folate 11.7 and B12: 542. Iron supplements. Hemoglobin stable. Outpatient follow-up. 8. Hypokalemia: Replaced. Magnesium 2.1.  9. Type II DM: Diabetic diet on d/c.  Continue Januvia at half dose. Recommended monitoring as outpatient.  10. Essential hypertension: Controlled. Stable. 11. Acute on Stage III chronic kidney disease: Baseline creatinine probably in the 1.4 range. Creatinine had worsened from 1.58-1.75, likely related to diuresis and IV Lasix was stopped. Creatinine has plateaued at 1.7. Stable at 1.7 12. Acute respiratory failure with hypoxia: Due to CHF and pleural effusions. Treated underlying cause. Hypoxia resolved. 13. Insomnia: d/c on melatonin   Procedures:  None  Consultations:  Cardiology  Discharge Exam: Vitals:   08/04/16 1958 08/05/16 0428  BP: (!) 110/39 (!) 122/44  Pulse: (!) 59 60  Resp: 16 18  Temp: 98.7 F (37.1 C) 98.5 F (36.9 C)    General: Pt in nad,, alert and awake Cardiovascular: s1 and s2 present, no rubs or gallops Respiratory: no increased wob, no wheezes  Discharge Instructions   Discharge Instructions    Call MD for:  difficulty breathing, headache or visual disturbances    Complete by:  As directed    Call MD for:  extreme fatigue    Complete by:  As directed    Diet - low sodium heart healthy    Complete by:  As directed    Increase activity slowly    Complete by:  As directed      Current Discharge Medication List    START taking these medications   Details  sacubitril-valsartan (ENTRESTO) 24-26 MG Take 1 tablet by mouth 2 (two) times daily. Qty: 60 tablet, Refills: 0      CONTINUE these medications which have CHANGED   Details  amiodarone (PACERONE) 100 MG tablet Take 1 tablet (100 mg total) by mouth daily. Qty: 30 tablet, Refills: 0    JANUVIA 50 MG tablet Take 0.5 tablets (25 mg total) by mouth  daily. Refills: 2      CONTINUE these medications which have NOT CHANGED   Details  acetaminophen (TYLENOL) 325 MG tablet Take 650 mg by mouth every 6 (six) hours as needed for mild pain.    albuterol (PROVENTIL HFA;VENTOLIN HFA) 108 (90 Base) MCG/ACT inhaler Inhale 1-2 puffs into the lungs every 4 (four) hours as needed for wheezing or shortness of breath.    atorvastatin (LIPITOR) 10 MG tablet Take 1 tablet (10 mg total) by mouth daily. Qty: 30 tablet, Refills: 3    benzonatate (TESSALON) 200 MG capsule Take 200 mg by mouth 3 (three) times daily as needed for cough.    carvedilol (COREG) 12.5 MG tablet TAKE 1 TABLET BY MOUTH TWICE DAILY WITH A MEAL Qty: 180 tablet, Refills: 0    clopidogrel (PLAVIX) 75 MG tablet Take 1 tablet (75 mg total) by mouth daily with breakfast. Qty: 90 tablet, Refills: 0    Cobalamine Combinations (VITAMIN B12-FOLIC ACID PO) Take 1 tablet by mouth every Monday, Wednesday, and Friday.    finasteride (PROSCAR) 5 MG tablet Take 5 mg by mouth daily.    furosemide (LASIX) 80 MG tablet Take 80 mg by mouth daily.    isosorbide mononitrate (IMDUR) 30 MG 24 hr tablet TAKE 1 TABLET BY MOUTH DAILY GENERIC EQUIVALENT FOR IMDUR Qty: 90 tablet, Refills: 2   Associated Diagnoses: Acute on chronic combined systolic and  diastolic ACC/AHA stage C congestive heart failure (HCC)    levothyroxine (SYNTHROID, LEVOTHROID) 112 MCG tablet Take 112 mcg by mouth daily before breakfast.    MELATONIN PO Take 10 mg by mouth at bedtime as needed.    nitroGLYCERIN (NITROSTAT) 0.4 MG SL tablet PLACE 1 TABLET (0.4 MG TOTAL) UNDER THE TONGUE EVERY 5 (FIVE) MINUTES AS NEEDED FOR CHEST PAIN. Qty: 25 tablet, Refills: 3      STOP taking these medications     amLODipine (NORVASC) 5 MG tablet      pantoprazole (PROTONIX) 40 MG tablet        Allergies  Allergen Reactions  . Clonidine Derivatives     unknown reaction   . Codeine     unknown reaction   . Meperidine Hcl      REACTION: Hypotension  . Motrin [Ibuprofen]     unknown reaction   . Other     Celery - unknown reaction  . Penicillins     unknown reaction   . Demerol Other (See Comments)    hypotension  . Heparin Other (See Comments)    hypotension  . Metformin And Related Diarrhea    Contact information for follow-up providers    Burtis Junes, NP Follow up on 08/17/2016.   Specialties:  Nurse Practitioner, Interventional Cardiology, Cardiology, Radiology Why:  at 2pm for your follow up appt.  Contact information: [place]. [address] [place] [address] [phone]            Contact information for after-discharge care    Destination    [facility] Follow up.   Specialty:  [place] information: [address] [place] [phone]                   The results of significant diagnostics from this hospitalization (including imaging, microbiology, ancillary and laboratory) are listed below for reference.    Significant Diagnostic Studies: Dg Chest 2 View  Result Date: 08/02/2016 CLINICAL DATA:  CHF EXAM: CHEST  2 VIEW COMPARISON:  07/31/2016 FINDINGS: Left pacer is unchanged. Prior CABG. Bilateral perihilar airspace opacities have improved since prior study. Small layering effusions. IMPRESSION: Mild pulmonary edema/ CHF, improving since prior study. Small effusions. Electronically Signed   By: Rolm Baptise M.D.   On: 08/02/2016 07:55   Dg Chest 2 View  Result Date: 07/31/2016 CLINICAL DATA:  Respiratory failure. EXAM: CHEST  2 VIEW COMPARISON:  07/30/2016. FINDINGS: Cardiac pacer in stable position. Prior CABG. Cardiomegaly. Progressive bilateral pulmonary infiltrates consistent pulmonary edema. Tiny bilateral pleural effusions cannot be excluded. No pneumothorax . IMPRESSION: Cardiac pacer in stable position. Prior CABG. Congestive heart failure with continued progression of  bilateral pulmonary edema. Tiny bilateral pleural effusions . Electronically Signed   By: Marcello Moores  Register   On: 07/31/2016 08:02   Dg Chest 2 View  Result Date: 07/30/2016 CLINICAL DATA:  Chronic shortness of breath.  Hypoxia. EXAM: CHEST  2 VIEW COMPARISON:  06/23/2016. FINDINGS: Cardiac pacer noted stable position. Prior CABG. Cardiomegaly again noted. Progressive bilateral pulmonary pulmonary interstitial infiltrates consistent pulmonary edema. Small bilateral pleural effusions. IMPRESSION: Cardiac pacer stable position. Prior CABG. Progressive changes of congestive heart failure with bilateral pulmonary interstitial edema and small pleural effusions . Electronically Signed   By: Marcello Moores  Register   On: 07/30/2016 10:52    Microbiology: Recent Results (from the past 240 hour(s))  Gastrointestinal Panel by PCR , Stool  Status: None   Collection Time: 07/30/16  1:35 PM  Result Value Ref Range Status   Campylobacter species NOT DETECTED NOT DETECTED Final   Plesimonas shigelloides NOT DETECTED NOT DETECTED Final   Salmonella species NOT DETECTED NOT DETECTED Final   Yersinia enterocolitica NOT DETECTED NOT DETECTED Final   Vibrio species NOT DETECTED NOT DETECTED Final   Vibrio cholerae NOT DETECTED NOT DETECTED Final   Enteroaggregative E coli (EAEC) NOT DETECTED NOT DETECTED Final   Enteropathogenic E coli (EPEC) NOT DETECTED NOT DETECTED Final   Enterotoxigenic E coli (ETEC) NOT DETECTED NOT DETECTED Final   Shiga like toxin producing E coli (STEC) NOT DETECTED NOT DETECTED Final   Shigella/Enteroinvasive E coli (EIEC) NOT DETECTED NOT DETECTED Final   Cryptosporidium NOT DETECTED NOT DETECTED Final   Cyclospora cayetanensis NOT DETECTED NOT DETECTED Final   Entamoeba histolytica NOT DETECTED NOT DETECTED Final   Giardia lamblia NOT DETECTED NOT DETECTED Final   Adenovirus F40/41 NOT DETECTED NOT DETECTED Final   Astrovirus NOT DETECTED NOT DETECTED Final   Norovirus GI/GII NOT  DETECTED NOT DETECTED Final   Rotavirus A NOT DETECTED NOT DETECTED Final   Sapovirus (I, II, IV, and V) NOT DETECTED NOT DETECTED Final  C difficile quick scan w PCR reflex     Status: Abnormal   Collection Time: 07/30/16  1:35 PM  Result Value Ref Range Status   C Diff antigen POSITIVE (A) NEGATIVE Final   C Diff toxin NEGATIVE NEGATIVE Final   C Diff interpretation Results are indeterminate. See PCR results.  Final  Clostridium Difficile by PCR     Status: None   Collection Time: 07/30/16  1:35 PM  Result Value Ref Range Status   Toxigenic C Difficile by pcr NEGATIVE NEGATIVE Final    Comment: Patient is colonized with non toxigenic C. difficile. May not need treatment unless significant symptoms are present.     Labs: Basic Metabolic Panel:  Recent Labs Lab 07/30/16 1617  08/01/16 0547 08/02/16 0504 08/03/16 0727 08/04/16 0410 08/05/16 0304  NA  --   < > 140 138 139 139 139  K  --   < > 3.4* 4.0 3.8 3.6 3.4*  CL  --   < > 104 105 105 105 104  CO2  --   < > 25 21* 23 22 24   GLUCOSE  --   < > 131* 140* 137* 171* 162*  BUN  --   < > 22* 27* 29* 29* 25*  CREATININE  --   < > 1.65* 1.58* 1.75* 1.74* 1.77*  CALCIUM  --   < > 8.1* 8.4* 8.2* 8.2* 8.1*  MG 2.1  --   --   --   --   --   --   PHOS 3.7  --   --   --   --   --   --   < > = values in this interval not displayed. Liver Function Tests:  Recent Labs Lab 07/30/16 0852  AST 16  ALT 13*  ALKPHOS 65  BILITOT 1.4*  PROT 6.2*  ALBUMIN 3.5    Recent Labs Lab 07/30/16 0852  LIPASE 40   No results for input(s): AMMONIA in the last 168 hours. CBC:  Recent Labs Lab 07/30/16 0852 07/31/16 0340 08/01/16 0547 08/02/16 0504  WBC 6.5 6.8 6.8 6.1  NEUTROABS 5.6  --   --   --   HGB 9.0* 8.6* 8.2* 8.8*  HCT 28.9* 27.7* 27.8* 29.2*  MCV 76.9* 76.1* 77.0* 76.6*  PLT 216 238 244 215   Cardiac Enzymes:  Recent Labs Lab 07/30/16 0852 07/30/16 1617  TROPONINI 0.03* 0.04*   BNP: BNP (last 3  results)  Recent Labs  07/30/16 0852  BNP 1,276.9*    ProBNP (last 3 results) No results for input(s): PROBNP in the last 8760 hours.  CBG:  Recent Labs Lab 08/04/16 0738 08/04/16 1138 08/04/16 1630 08/04/16 2130 08/05/16 0730  GLUCAP 177* 199* 282* 288* 164*    Signed:  Velvet Bathe MD.  [facility] 08/05/2016, 10:47 AM

## 2016-08-05 NOTE — Clinical Social Work Note (Signed)
Insurance authorization obtained: [id number], rug level RVB. Patient can discharge to [facility] today if still stable. DNR on chart to be signed. CSW paged MD to notify.   Evan [name], [place]

## 2016-08-05 NOTE — Progress Notes (Signed)
Progress Note  Patient Name: Evan [name] Date of Encounter: 08/05/2016  Primary Cardiologist: Johnsie Cancel  Subjective   Feels well today.   Inpatient Medications    Scheduled Meds: . amiodarone  100 mg Oral Daily  . atorvastatin  10 mg Oral Daily  . carvedilol  12.5 mg Oral BID WC  . clopidogrel  75 mg Oral Q breakfast  . finasteride  5 mg Oral Daily  . furosemide  80 mg Oral Daily  . insulin aspart  0-5 Units Subcutaneous QHS  . insulin aspart  0-9 Units Subcutaneous TID WC  . isosorbide mononitrate  30 mg Oral Daily  . levothyroxine  112 mcg Oral QAC breakfast  . saccharomyces boulardii  250 mg Oral BID  . sacubitril-valsartan  1 tablet Oral BID   Continuous Infusions:  PRN Meds: acetaminophen **OR** acetaminophen, albuterol, hydrALAZINE, loperamide, nitroGLYCERIN, ondansetron **OR** ondansetron (ZOFRAN) IV   Vital Signs    Vitals:   08/04/16 0552 08/04/16 1204 08/04/16 1958 08/05/16 0428  BP: 130/82 (!) 122/40 (!) 110/39 (!) 122/44  Pulse: 60 (!) 59 (!) 59 60  Resp: 16 16 16 18   Temp: 98.9 F (37.2 C) 97.6 F (36.4 C) 98.7 F (37.1 C) 98.5 F (36.9 C)  TempSrc: Oral Oral Oral Oral  SpO2: 99% 99% 96% 94%  Weight: 150 lb 12.7 oz (68.4 kg)   155 lb 8 oz (70.5 kg)  Height:        Intake/Output Summary (Last 24 hours) at 08/05/16 0848 Last data filed at 08/04/16 1856  Gross per 24 hour  Intake              480 ml  Output              520 ml  Net              -40 ml   Filed Weights   08/03/16 0505 08/04/16 0552 08/05/16 0428  Weight: 156 lb 12.8 oz (71.1 kg) 150 lb 12.7 oz (68.4 kg) 155 lb 8 oz (70.5 kg)    Telemetry    AV paced - Personally Reviewed  ECG    N/A - Personally Reviewed  Physical Exam   General: Frail, well nourished, male appearing in no acute distress. Head: Normocephalic, atraumatic.  Neck: Supple without bruits, JVD. Lungs:  Resp regular and unlabored, CTA. Heart: RRR, S1, S2, no S3, S4, 4/6 systolic murmur; no  rub. Abdomen: Soft, non-tender, non-distended with normoactive bowel sounds. No hepatomegaly. No rebound/guarding. No obvious abdominal masses. Extremities: No clubbing, cyanosis, 1+ LE edema. Distal pedal pulses are 2+ bilaterally. Neuro: Alert and oriented X 3. Moves all extremities spontaneously. Psych: Normal affect.  Labs    Chemistry Recent Labs Lab 07/30/16 [phone]  08/03/16 0727 08/04/16 0410 08/05/16 0304  NA 137  < > 139 139 139  K 3.5  < > 3.8 3.6 3.4*  CL 103  < > 105 105 104  CO2 21*  < > 23 22 24   GLUCOSE 207*  < > 137* 171* 162*  BUN 23*  < > 29* 29* 25*  CREATININE 1.83*  < > 1.75* 1.74* 1.77*  CALCIUM 8.6*  < > 8.2* 8.2* 8.1*  PROT 6.2*  --   --   --   --   ALBUMIN 3.5  --   --   --   --   AST 16  --   --   --   --   ALT 13*  --   --   --   --  ALKPHOS 65  --   --   --   --   BILITOT 1.4*  --   --   --   --   GFRNONAA 31*  < > 33* 33* 32*  GFRAA 36*  < > 38* 38* 37*  ANIONGAP 13  < > 11 12 11   < > = values in this interval not displayed.   Hematology Recent Labs Lab 07/31/16 0340 08/01/16 0547 08/02/16 0504  WBC 6.8 6.8 6.1  RBC 3.64* 3.61* 3.81*  HGB 8.6* 8.2* 8.8*  HCT 27.7* 27.8* 29.2*  MCV 76.1* 77.0* 76.6*  MCH 23.6* 22.7* 23.1*  MCHC 31.0 29.5* 30.1  RDW 16.9* 17.1* 17.7*  PLT 238 244 215    Cardiac Enzymes Recent Labs Lab 07/30/16 0852 07/30/16 1617  TROPONINI 0.03* 0.04*   No results for input(s): TROPIPOC in the last 168 hours.   BNP Recent Labs Lab 07/30/16 0852  BNP 1,276.9*     DDimer No results for input(s): DDIMER in the last 168 hours.    Radiology    No results found.  Cardiac Studies   TTE: 07/30/16  Study Conclusions  - Left ventricle: The cavity size was normal. Systolic function was severely reduced. The estimated ejection fraction was in the range of 25% to 30%. Severe diffuse hypokinesis with no identifiable regional variations. There is substantial systolic dyssynchrony, both  septal-to-lateral wall and apex-to-base. Doppler parameters are consistent with restrictive physiology, indicative of decreased left ventricular diastolic compliance and/or increased left atrial pressure. Acoustic contrast opacification revealed no evidence ofthrombus. - Ventricular septum: Septal motion showed abnormal function, dyssynergy, and paradox. These changes are consistent with right ventricular pacing. - Aortic valve: Noncoronary cusp mobility was severely restricted. Transvalvular velocity was increased less than expected, due to low cardiac output. There was mild stenosis. There was mild regurgitation directed centrally in the LVOT. Valve area (VTI): 1.41 cm^2. - Mitral valve: Calcified annulus. There was mild to moderate regurgitation directed centrally. - Left atrium: The atrium was moderately dilated. - Right ventricle: Systolic function was mildly reduced. - Tricuspid valve: There was moderate regurgitation. - Pulmonary arteries: Systolic pressure was moderately increased. PA peak pressure: 63 mm Hg (S).  Patient Profile     83 y.o. male with past medical history of CAD s/p CABG 5 vessel (1996), s/p DES to SVG -->OM 1/2 (2015), diabetes, chronic systolic heart failure, PVD, hypothyroidism, complete heart block status post pacemaker, PAF and PAT on amiodarone who presented with shortness of breath and found to have newly reduced EF. Diuresing with IV lasix.   Assessment & Plan    1. Acute on Chronic combined GM:WNUU this admission showed worsening LV function of 25-30% with elevated BNP on admission. Diuresed with IV lasix. Overall much improved, with weight trending down. Cr/BUN stable today with the addition of oral lasix yesterday.  -- on medical therapy with ASA, Plavix, BB, Imdur and Entresto -- will arrange follow up in the office  2. CAD s/p CABG 5v:No reports of chest pain. Last cath in 2015 with 5/5 patent grafts but DES placed to  SVG--OM for ISR. Suspect fall in EF related to worsening CAD. Planned for medical therapy at this time. On good medications.   3. CKD stage VO:ZDGUYQIH Cr around 1.5, BUN/Cr stable at 1.7.   4. Hypokalemia: 44meq x1 now.  Signed, Reino Bellis, NP  08/05/2016, 8:48 AM    I have examined the patient and reviewed assessment and plan and discussed with patient.  Agree  with above as stated.  Continue oral Lasix. Cr stable.   Larae Grooms

## 2016-08-05 NOTE — Clinical Social Work Placement (Signed)
   CLINICAL SOCIAL WORK PLACEMENT  NOTE  Date:  08/05/2016  Patient Details  Name: Evan [name] MRN: [id number] Date of Birth: 06-May-1923  Clinical Social Work is seeking post-discharge placement for this patient at the [facility] level of care (*CSW will initial, date and re-position this form in  chart as items are completed):  Yes   Patient/family provided with [facility] Work Department's list of facilities offering this level of care within the geographic area requested by the patient (or if unable, by the patient's family).  Yes   Patient/family informed of their freedom to choose among providers that offer the needed level of care, that participate in Medicare, Medicaid or managed care program needed by the patient, have an available bed and are willing to accept the patient.  Yes   Patient/family informed of [facility]'s ownership interest in [facility] and [facility], as well as of the fact that they are under no obligation to receive care at these facilities.  PASRR submitted to EDS on 08/03/16     PASRR number received on 08/03/16     Existing PASRR number confirmed on       FL2 transmitted to all facilities in geographic area requested by pt/family on 08/03/16     FL2 transmitted to all facilities within larger geographic area on       Patient informed that his/her managed care company has contracts with or will negotiate with certain facilities, including the following:        Yes   Patient/family informed of bed offers received.  Patient chooses bed at [facility] recommends and patient chooses bed at      Patient to be transferred to [facility] on 08/05/16.  Patient to be transferred to facility by [facility]     Patient family notified on 08/05/16 of transfer.  Name of family member notified:  [name]     PHYSICIAN Please prepare prescriptions     Additional Comment:     _______________________________________________ Candie Chroman, LCSW 08/05/2016, 12:08 PM

## 2016-08-06 ENCOUNTER — Encounter: Payer: Self-pay | Admitting: Internal Medicine

## 2016-08-06 ENCOUNTER — Non-Acute Institutional Stay (SKILLED_NURSING_FACILITY): Payer: Medicare Other | Admitting: Internal Medicine

## 2016-08-06 ENCOUNTER — Telehealth: Payer: Self-pay | Admitting: *Deleted

## 2016-08-06 DIAGNOSIS — E1121 Type 2 diabetes mellitus with diabetic nephropathy: Secondary | ICD-10-CM

## 2016-08-06 DIAGNOSIS — I5043 Acute on chronic combined systolic (congestive) and diastolic (congestive) heart failure: Secondary | ICD-10-CM

## 2016-08-06 DIAGNOSIS — D508 Other iron deficiency anemias: Secondary | ICD-10-CM | POA: Diagnosis not present

## 2016-08-06 DIAGNOSIS — R195 Other fecal abnormalities: Secondary | ICD-10-CM

## 2016-08-06 DIAGNOSIS — R5383 Other fatigue: Secondary | ICD-10-CM | POA: Diagnosis not present

## 2016-08-06 NOTE — Assessment & Plan Note (Signed)
Update TSH

## 2016-08-06 NOTE — Patient Instructions (Signed)
See assessment and plan under each diagnosis in the problem list and acutely for this visit 

## 2016-08-06 NOTE — Assessment & Plan Note (Addendum)
08/06/16 his anemia is more likely than not exacerbating his exertional angina and profound fatigue.  Iron supplement will be initiated. This may help the consistency of his stools

## 2016-08-06 NOTE — Assessment & Plan Note (Signed)
Probiotic  check TSH

## 2016-08-06 NOTE — Assessment & Plan Note (Signed)
08/06/16 the high-dose furosemide inadvertently was not continued at the SNF, this has been ordered. This is essential to prevent acute decompensation.

## 2016-08-06 NOTE — Assessment & Plan Note (Addendum)
Document DM control via repeat A1c

## 2016-08-06 NOTE — Telephone Encounter (Signed)
Prior authorization for [facility] faxed to [place] [facility] [place] with form they faxed to me.

## 2016-08-06 NOTE — Progress Notes (Signed)
NURSING HOME LOCATION:  [facility] ROOM NUMBER:  [id number]  CODE STATUS:  DNR  PCP:  Deland Pretty, MD  [address] [place] [address]  This is a comprehensive admission note to [facility] performed on this date less than 30 days from date of admission. Included are preadmission medical/surgical history;reconciled medication list; family history; social history and comprehensive review of systems.  Corrections and additions to the records were documented . Comprehensive physical exam was also performed. Additionally a clinical summary was entered for each active diagnosis pertinent to this admission in the Problem List to enhance continuity of care.  HPI: 83 yo hospitalized 7/26-08/05/16 with acute on chronic systolic and diastolic heart failure, NYHA class IV. The patient had been in independent living ambulating with help of walker. Patient presented with several-day history of progressive worsening dyspnea, orthopnea, and progressive lower extremity edema. His chronic chest pain had not changed during this period and surprisingly his weight had been stable @ 155 pounds. Echo revealed that his ejection fraction had dropped to 25-30 percent from 45-50 percent in 2015. [name] was initiated and amiodarone initially held. Pulmonary edema and bilateral effusions improved serially on imaging with diuresis. Cardiology felt that his heart failure was exacerbated by progressive coronary disease which requires medical management. Diuresis parenterally was held 7/30 because of an elevated creatinine, rising to 1.75. Oral furosemide was reinitiated. Plavix was continued, as recent GI bleeding (see below) precluded other anticoagulation. The patient was experiencing some diarrhea. C. difficile antigen was positive but toxin was negative. PCR  GI pathogen panel was also negative; probiotics were initiated. Actually the patient describes some normal stools alternating with  some loose stools rather than frank diarrhea. Significantly the patient had been hospitalized 6/19-6/22 with acute lower GI bleed.EGD & colonoscopy were deferred due to the high cardiac risk, ABLA, and type II  STEMI. The GI bleed was associated with a microcytic, iron deficiency anemia and required blood transfusion.  On 7/29 he exhibited a microcytic, hypochromic anemia with hemoglobin 8.8 and hematocrit 29.2. On 7/26 iron was 21 with normal of  >45. Ferritin was low normal at 29. He is not on iron supplements. He is on Januvia for diabetes. There is no current A1c on record.  Past medical and surgical history: Includes coronary disease, diabetes, cardio myopathy, complete heart block and paroxysmal atrial fibrillation for which he was on amiodarone. Other diagnoses include hypothyroidism, dyslipidemia, hypertension,  GERD, and chronic chest pain. He has had bypass as well as stent placement. Also he has had a pacemaker placed.   Social history: Nondrinker, quit smoking 1958.  Family history: Reviewed, noncontributory due to age.  Review of systems: His main concern is that he is "exhausted" described as " fatigue". The last TSH on record was 0.458 on 12/20/14. The fatigue is worse with exertion associated with chest pain and bilateral shoulder pain. He does not have the chest pain or shoulder pain at rest. He describes being "tired" from the waist down. He describes "diarrhea" approximately for a year. He thought that the rectal bleeding was 6 months ago. He now describes his stools as varying from normal to loose to watery. He also describes increased mucus. He had not been a probiotic before.  Constitutional: No fever,significant weight change  Eyes: No redness, discharge, pain, vision change ENT/mouth: No nasal congestion,  purulent discharge, earache,change in hearing ,sore throat  Cardiovascular: No palpitations,paroxysmal nocturnal dyspnea, claudication, edema  Respiratory: No cough,  sputum production,hemoptysis,  DOE , significant snoring,apnea  Gastrointestinal: No heartburn,dysphagia,abdominal pain, nausea / vomiting,rectal bleeding, melena Genitourinary: No dysuria,hematuria, pyuria,  incontinence, nocturia Musculoskeletal: No joint stiffness, joint swelling Dermatologic: No rash, pruritus, change in appearance of skin Neurologic: No dizziness,headache,syncope, seizures, numbness , tingling Psychiatric: No significant anxiety , depression, insomnia, anorexia Endocrine: No change in hair/skin/ nails, excessive thirst, excessive hunger, excessive urination  Hematologic/lymphatic: No significant bruising, lymphadenopathy,abnormal bleeding Allergy/immunology: No itchy/ watery eyes, significant sneezing, urticaria, angioedema  Physical exam:  Pertinent or positive findings:Initially the patient was sleeping but could be aroused. He was markedly hard of hearing. He is Geneticist, molecular. He is very pale. He has two vitiliginous scars over the forehead and left anterior temporal areas. He has a few upper teeth. Dental hygiene is poor. There is a grade 1.5 murmur at the left base. Second heart sound is increased. He has 1+ edema. Pedal pulses are decreased.   General appearance:Adequately nourished; no acute distress , increased work of breathing is present.   Lymphatic: No lymphadenopathy about the head, neck, axilla . Eyes: No conjunctival inflammation or lid edema is present. There is no scleral icterus. Ears:  External ear exam shows no significant lesions or deformities.   Nose:  External nasal examination shows no deformity or inflammation. Nasal mucosa are pink and moist without lesions ,exudates Oral exam: lips and gums are healthy appearing.There is no oropharyngeal erythema or exudate . Neck:  No thyromegaly, masses, tenderness noted.    Heart:  Normal rate and regular rhythm. S1 normal without gallop,  click, rub .  Lungs: without wheezes, rhonchi,rales , rubs. Abdomen:Bowel  sounds are normal. Abdomen is soft and nontender with no organomegaly, hernias,masses. GU: deferred  Extremities:  No cyanosis, clubbing  Neurologic exam : Strength equal  in upper & lower extremities but decreased Balance,Rhomberg,finger to nose testing could not be completed due to clinical state Skin: Warm & dry w/o tenting. No significant lesions or rash.  See clinical summary under each active problem in the Problem List with associated updated therapeutic plan]

## 2016-08-07 LAB — HEMOGLOBIN A1C: Hemoglobin A1C: 7.8

## 2016-08-07 LAB — TSH: TSH: 2.35 (ref 0.41–5.90)

## 2016-08-07 NOTE — Telephone Encounter (Signed)
Follow up      [employer] received entresto prior authorization form but they have one question to ask before they can complete the authorization.  Please call

## 2016-08-07 NOTE — Telephone Encounter (Signed)
F/U call:  Sati from [facility] calling in regards to entresto medication

## 2016-08-11 NOTE — Telephone Encounter (Signed)
**Note De-Identified Ajamu Maxon Obfuscation** I called [employer] back and s/w Myra P. Per Myra this issue has been resolved and that the pts Entresto PA  Has been approved from 08/06/16 until 08/06/17.

## 2016-08-17 ENCOUNTER — Ambulatory Visit: Payer: Medicare Other | Admitting: Nurse Practitioner

## 2016-08-17 NOTE — Progress Notes (Deleted)
CARDIOLOGY OFFICE NOTE  Date:  08/17/2016    Evan [name] Date of Birth: 01-Oct-1923 Medical Record #[id number]  PCP:  Evan Pretty, MD  Cardiologist:  Evan [name]    No chief complaint on file.   History of Present Illness: Evan [name] is a 83 y.o. male who presents today for a one month check. Seen for [name] and [name].   He has a history of CAD s/p CABG x5V (1996) s/p DES to SVG--> OM1/2 (2015), DMT2, LV dysfunction (EF 45-50% in 2015), PAD, hypothyroidism, CHB s/p PPM, PAF, and PAT on amiodarone.   He was admitted in 2015 with syncope/bradycardia and chest pain. He was was cathed and had DES to the SVG to the OM - had 5/5 patent bypass grafts. Cath was followed by PPM implant per Dr. Rayann Heman - dual chamber device. Has chronic weakness in the legs/atrophy and has had frequent falls. He is on chronic amiodarone for PAF. He is not felt to be a good candidate for [place].   Admitted 6/19-6/22/18. He presented with hematochezia and chest pain. Hg dropped from 12 to 8. He was transfused PRBCs. Troponin peaked at 8.21. He was not started on heparin and ASA/plavix were discontinued given active bleeding. He was not felt to be stable for EGD given significant rise in troponin. His troponin trended down and Hg remained stable. GI cleared him for plavix and he was discharged home.   Last seen here about a month ago by Evan Leitz, PA - he was doing better. Back walking. No significant chest painn. Was to come back in 3 months.   Admitted back at the end of July with progressive SOB and swelling. Echo noted that EF has dropped from 45 to 50% to 25 to 30% - this is presumed to be due to worsening CAD - he is to be managed medically. Norvasc stopped. Entresto started.   Comes in today. Here with   Past Medical History:  Diagnosis Date  . AC joint dislocation   . ALLERGIC RHINITIS   . Allergic rhinitis   . Anginal pain ([place])   . Arthritis   . Atrial tachycardia ([place])    . BPH (benign prostatic hyperplasia)   . CAD   . CHF (congestive heart failure) ([place])   . Diarrhea   . DM    type 2  . GERD (gastroesophageal reflux disease)   . H/O: GI bleed   . HYPERCHOLESTEROLEMIA   . HYPERTENSION   . Iron deficiency anemia   . NEPHROLITHIASIS, HX OF   . Paroxysmal atrial fibrillation (HCC)   . Presence of permanent cardiac pacemaker 12/12/2013  . Rib fracture   . Stable angina (HCC)   . Syncope   . Thyroid disease   . TRANSIENT ISCHEMIC ATTACKS, HX OF   . 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 Urinary frequency   . Vertigo   . Weakness of left leg     Past Surgical History:  Procedure Laterality Date  . AV FISTULA REPAIR    . BACK SURGERY  2000'S  . CARDIAC CATHETERIZATION  12/11/2013   Procedure: CORONARY STENT INTERVENTION;  Surgeon: Burnell Blanks, MD;  Location: [facility] CATH LAB;  Service: Cardiovascular;;  DES 3.5 x 15 Resolute to seq SVG of OM1/OM2   . CORONARY ARTERY BYPASS GRAFT  1996  . CORONARY STENT PLACEMENT  12/11/13   SVG-OM DES  . FEMORAL ARTERY ANEURYSM REPAIR  feroral pseudo-aneurysm repair  . INSERT / REPLACE / REMOVE PACEMAKER    . LEFT HEART CATHETERIZATION WITH CORONARY/GRAFT ANGIOGRAM N/A 12/11/2013   Procedure: LEFT HEART CATHETERIZATION WITH Beatrix Fetters;  Surgeon: Burnell Blanks, MD;  Location: [facility] CATH LAB;  Service: Cardiovascular;  Laterality: N/A;  . PERMANENT PACEMAKER INSERTION N/A 12/12/2013   MDT ADDRL1 pacemaker implnated by Dr Rayann Heman  . PTCA  1980's     Medications: No outpatient prescriptions have been marked as taking for the 08/17/16 encounter (Appointment) with Burtis Junes, NP.     Allergies: Allergies  Allergen Reactions  . Clonidine Derivatives     unknown reaction   . Codeine     unknown reaction   . Meperidine Hcl     REACTION: Hypotension  . Motrin [Ibuprofen]     unknown reaction   . Other     Celery - unknown reaction  .  Penicillins     unknown reaction   . Demerol Other (See Comments)    hypotension  . Heparin Other (See Comments)    hypotension  . Metformin And Related Diarrhea    Social History: The patient  reports that he quit smoking about 60 years ago. He has never used smokeless tobacco. He reports that he does not drink alcohol or use drugs.   Family History: The patient's family history includes Coronary artery disease in his father; Diabetes in his mother.   Review of Systems: Please see the history of present illness.   Otherwise, the review of systems is positive for none.   All other systems are reviewed and negative.   Physical Exam: VS:  There were no vitals taken for this visit. Marland Kitchen  BMI There is no height or weight on file to calculate BMI.  Wt Readings from Last 3 Encounters:  08/06/16 155 lb 6.8 oz (70.5 kg)  08/05/16 155 lb 8 oz (70.5 kg)  07/16/16 158 lb 6.4 oz (71.8 kg)    General: Pleasant. Well developed, well nourished and in no acute distress.   HEENT: Normal.  Neck: Supple, no JVD, carotid bruits, or masses noted.  Cardiac: Regular rate and rhythm. No murmurs, rubs, or gallops. No edema.  Respiratory:  Lungs are clear to auscultation bilaterally with normal work of breathing.  GI: Soft and nontender.  MS: No deformity or atrophy. Gait and ROM intact.  Skin: Warm and dry. Color is normal.  Neuro:  Strength and sensation are intact and no gross focal deficits noted.  Psych: Alert, appropriate and with normal affect.   LABORATORY DATA:  EKG:  EKG is not ordered today.  Lab Results  Component Value Date   WBC 6.1 08/02/2016   HGB 8.8 (L) 08/02/2016   HCT 29.2 (L) 08/02/2016   PLT 215 08/02/2016   GLUCOSE 162 (H) 08/05/2016   ALT 13 (L) 07/30/2016   AST 16 07/30/2016   NA 139 08/05/2016   K 3.4 (L) 08/05/2016   CL 104 08/05/2016   CREATININE 1.77 (H) 08/05/2016   BUN 25 (H) 08/05/2016   CO2 24 08/05/2016   TSH 2.35 08/07/2016   INR 1.08 06/24/2016    HGBA1C 7.8 08/07/2016     BNP (last 3 results)  Recent Labs  07/30/16 0852  BNP 1,276.9*    ProBNP (last 3 results) No results for input(s): PROBNP in the last 8760 hours.   Other Studies Reviewed Today:  TTE: 07/30/16  Study Conclusions  - Left ventricle: The cavity size was normal. Systolic function  was severely reduced. The estimated ejection fraction was in the range of 25% to 30%. Severe diffuse hypokinesis with no identifiable regional variations. There is substantial systolic dyssynchrony, both septal-to-lateral wall and apex-to-base. Doppler parameters are consistent with restrictive physiology, indicative of decreased left ventricular diastolic compliance and/or increased left atrial pressure. Acoustic contrast opacification revealed no evidence ofthrombus. - Ventricular septum: Septal motion showed abnormal function, dyssynergy, and paradox. These changes are consistent with right ventricular pacing. - Aortic valve: Noncoronary cusp mobility was severely restricted. Transvalvular velocity was increased less than expected, due to low cardiac output. There was mild stenosis. There was mild regurgitation directed centrally in the LVOT. Valve area (VTI): 1.41 cm^2. - Mitral valve: Calcified annulus. There was mild to moderate regurgitation directed centrally. - Left atrium: The atrium was moderately dilated. - Right ventricle: Systolic function was mildly reduced. - Tricuspid valve: There was moderate regurgitation. - Pulmonary arteries: Systolic pressure was moderately increased. PA peak pressure: 63 mm Hg (S).  Assessment/Plan:  1. Acute on chronic combined CHF: Echo this admission shows worsening of EF from 45-50 percent in 2015 to 25-30 percent. Cardiology was consulted and assisted with management.Amlodipine stopped. Entresto started this admission. Continue carvedilol, atorvastatin, Plavix and Imdur. Pro-calcitonin very  low, low index of suspicion for pneumonia. Chest x-ray 7/29 and reviewed, improving, mild pulmonary edema. Patient was continued on high-dose IV Lasix. Clinically improved. Orthopnea resolved. Creatinine has increased from 1.58-1.75. IV Lasix was stopped. Creatinine has plateaued at 1.7.  Weight down by 6 pounds. Started oral Lasix 80 mg daily on 7/31. Serum creatinine stable despite patient being on oral Lasix 2. Cardiomyopathy: Worsening EF as stated above. Presumed to be secondary to worsening cad per cardiology notes. Medical management recommended.  3. CAD status post CABG, chronic stable angina: Medical management for now. Continue carvedilol, Imdur, Plavix and statin. No chest pain since admission. Stable. 4. Paroxysmal A. fib: AV paced rhythm. Amiodarone. Not anticoagulation candidate due to GI bleed. Stable. 5. Bilateral pleural effusions: Likely due to CHF. Diuresis and follow chest x-ray periodically. Improving. Stable. 6. Diarrhea: Unclear etiology. C. difficile antigen positive, toxin negative, PCR negative. GI pathogen panel PCR negative. Do not suspect fecal impaction. Added probiotics. Apparently has some normal stools and at times loose stools. May consider outpatient GI follow-up. 7. Microcytic Anemia/iron deficiency: Recent GI bleed that required blood transfusion. Hemoglobin probably in the low 9 g range. No overt bleeding. No melena reported. Anemia panel: Iron 21, ferritin 29, folate 11.7 and B12: 542. Iron supplements. Hemoglobin stable. Outpatient follow-up. 8. Hypokalemia: Replaced. Magnesium 2.1.  9. Type II DM: Diabetic diet on d/c.  Continue Januvia at half dose. Recommended monitoring as outpatient.  10. Essential hypertension: Controlled. Stable. 11. Acute on Stage III chronic kidney disease:Baseline creatinine probably in the 1.4 range. Creatinine hadworsened from 1.58-1.75, likely related to diuresisand IV Lasix was stopped. Creatinine has plateaued at 1.7. Stable at  1.7 12. Acute respiratory failure with hypoxia: Due to CHF and pleural effusions. Treated underlying cause. Hypoxia resolved. 13. Insomnia: d/c on melatonin         CAD s/p CABG and PCI: with recent NSTEMI ( possibly type II) in the setting of acute anemia from hematochezia. GI cleared him for plavix. I do not think he needs to be on DAPT with ASA and plavix at this time. Continue plavix, statin and BB.   GIB: no more blood in stool. Will get a CBC today and make sure he gets follow up with Dr. Watt Climes  Chronic combined S/D CHF: continue lasix 80mg  BID. Appears euvolemic  CKD: creat 1.42 at discharge, which is around his baseline. Will check a BMET today  PAF/PAT: continue low dose amio, followed by Dr. Rayann Heman. Not felt to be a good candidate for [place]  CHB s/p PPM: followed by Dr. Rayann Heman  Current medicines are reviewed with the patient today.  The patient does not have concerns regarding medicines other than what has been noted above.  The following changes have been made:  See above.  Labs/ tests ordered today include:   No orders of the defined types were placed in this encounter.    Disposition:   FU with *** in {gen number [id number]} {Days to years:10300}.   Patient is agreeable to this plan and will call if any problems develop in the interim.   SignedTruitt Merle, NP  08/17/2016 7:37 AM  [facility] [address] [place], [place]  [address] Phone: [phone] Fax: [phone]

## 2016-08-18 ENCOUNTER — Encounter: Payer: Self-pay | Admitting: Nurse Practitioner

## 2016-08-24 ENCOUNTER — Encounter: Payer: Self-pay | Admitting: Adult Health

## 2016-08-24 ENCOUNTER — Non-Acute Institutional Stay (SKILLED_NURSING_FACILITY): Payer: Medicare Other | Admitting: Adult Health

## 2016-08-24 DIAGNOSIS — E039 Hypothyroidism, unspecified: Secondary | ICD-10-CM

## 2016-08-24 DIAGNOSIS — I25810 Atherosclerosis of coronary artery bypass graft(s) without angina pectoris: Secondary | ICD-10-CM

## 2016-08-24 DIAGNOSIS — G4700 Insomnia, unspecified: Secondary | ICD-10-CM

## 2016-08-24 DIAGNOSIS — R5381 Other malaise: Secondary | ICD-10-CM

## 2016-08-24 DIAGNOSIS — N400 Enlarged prostate without lower urinary tract symptoms: Secondary | ICD-10-CM

## 2016-08-24 DIAGNOSIS — F39 Unspecified mood [affective] disorder: Secondary | ICD-10-CM | POA: Diagnosis not present

## 2016-08-24 DIAGNOSIS — E1121 Type 2 diabetes mellitus with diabetic nephropathy: Secondary | ICD-10-CM | POA: Diagnosis not present

## 2016-08-24 DIAGNOSIS — I5043 Acute on chronic combined systolic (congestive) and diastolic (congestive) heart failure: Secondary | ICD-10-CM

## 2016-08-24 DIAGNOSIS — I480 Paroxysmal atrial fibrillation: Secondary | ICD-10-CM

## 2016-08-24 MED ORDER — FUROSEMIDE 80 MG PO TABS
80.0000 mg | ORAL_TABLET | Freq: Every day | ORAL | 0 refills | Status: DC
Start: 2016-08-24 — End: 2016-10-20

## 2016-08-24 MED ORDER — ATORVASTATIN CALCIUM 10 MG PO TABS: 10.0000 mg | ORAL_TABLET | Freq: Every day | ORAL | 0 refills | Status: AC

## 2016-08-24 MED ORDER — ISOSORBIDE MONONITRATE ER 30 MG PO TB24: 30.0000 mg | ORAL_TABLET | Freq: Every day | ORAL | 0 refills | Status: DC

## 2016-08-24 MED ORDER — BENZONATATE 200 MG PO CAPS: 200.0000 mg | ORAL_CAPSULE | Freq: Three times a day (TID) | ORAL | 0 refills | Status: DC | PRN

## 2016-08-24 MED ORDER — AMIODARONE HCL 100 MG PO TABS: 100.0000 mg | ORAL_TABLET | Freq: Every day | ORAL | 0 refills | Status: DC

## 2016-08-24 MED ORDER — LEVOTHYROXINE SODIUM 112 MCG PO TABS: 112.0000 ug | ORAL_TABLET | Freq: Every day | ORAL | 0 refills | Status: AC

## 2016-08-24 MED ORDER — ALBUTEROL SULFATE HFA 108 (90 BASE) MCG/ACT IN AERS: 2.0000 | INHALATION_SPRAY | RESPIRATORY_TRACT | 0 refills | Status: DC | PRN

## 2016-08-24 MED ORDER — NITROGLYCERIN 0.4 MG SL SUBL: SUBLINGUAL_TABLET | SUBLINGUAL | 0 refills | Status: AC

## 2016-08-24 MED ORDER — SITAGLIPTIN PHOSPHATE 25 MG PO TABS: 25.0000 mg | ORAL_TABLET | Freq: Every day | ORAL | 0 refills | Status: AC

## 2016-08-24 MED ORDER — CARVEDILOL 12.5 MG PO TABS: 12.5000 mg | ORAL_TABLET | Freq: Two times a day (BID) | ORAL | 0 refills | Status: AC

## 2016-08-24 MED ORDER — SACUBITRIL-VALSARTAN 24-26 MG PO TABS: 1.0000 | ORAL_TABLET | Freq: Two times a day (BID) | ORAL | 0 refills | Status: DC

## 2016-08-24 MED ORDER — CLOPIDOGREL BISULFATE 75 MG PO TABS: 75.0000 mg | ORAL_TABLET | Freq: Every day | ORAL | 0 refills | Status: DC

## 2016-08-24 MED ORDER — SACCHAROMYCES BOULARDII 250 MG PO CAPS: 250.0000 mg | ORAL_CAPSULE | Freq: Two times a day (BID) | ORAL | 0 refills | Status: AC

## 2016-08-24 MED ORDER — FINASTERIDE 5 MG PO TABS: 5.0000 mg | ORAL_TABLET | Freq: Every day | ORAL | 0 refills | Status: AC

## 2016-08-24 MED ORDER — DIVALPROEX SODIUM ER 250 MG PO TB24: 250.0000 mg | ORAL_TABLET | Freq: Every day | ORAL | 0 refills | Status: AC

## 2016-08-24 NOTE — Progress Notes (Signed)
DATE:  08/24/2016   MRN:  [id number]  BIRTHDAY: [birth year]  Facility:  Nursing Home Location:  [facility] Room Number: 215-A  LEVEL OF CARE:  SNF (31)  Contact Information    Name Relation Home Work [place] Daughter   [phone]   Spell,Llarhe Daughter   [phone]       Code Status History    Date Active Date Inactive Code Status Order ID Comments User Context   07/30/2016  1:56 PM 08/05/2016  4:29 PM DNR [id number]  Waldemar Dickens, MD ED   07/30/2016  1:48 PM 07/30/2016  1:56 PM Full Code [id number]  Waldemar Dickens, MD ED   07/30/2016  1:42 PM 07/30/2016  1:47 PM Full Code [id number]  Waldemar Dickens, MD ED   06/23/2016  2:29 PM 06/26/2016  5:05 PM DNR [id number]  Bonnielee Haff, MD Inpatient   01/16/2014 10:47 AM 01/20/2014  2:47 PM DNR [id number]  Burtis Junes, NP Inpatient   12/12/2013  8:38 PM 12/13/2013  6:04 PM Full Code [id number]  Thompson Grayer, MD Inpatient   12/11/2013 12:28 PM 12/12/2013  5:54 PM Full Code [id number]  Burnell Blanks, MD Inpatient   12/07/2013 10:07 PM 12/11/2013 12:28 PM Full Code [id number]  Alphia Moh, MD ED    Questions for Most Recent Historical Code Status (Order [id number])    Question Answer Comment   In the event of cardiac or respiratory ARREST Do not call a "code blue"    In the event of cardiac or respiratory ARREST Do not perform Intubation, CPR, defibrillation or ACLS    In the event of cardiac or respiratory ARREST Use medication by any route, position, wound care, and other measures to relive pain and suffering. May use oxygen, suction and manual treatment of airway obstruction as needed for comfort.         Advance Directive Documentation     Most Recent Value  Type of Advance Directive  Out of facility DNR (pink MOST or yellow form)  Pre-existing out of facility DNR order (yellow form or pink MOST form)  -  "MOST" Form in Place?  -       Chief Complaint  Patient presents  with  . Discharge Note    Discharge visit    HISTORY OF PRESENT ILLNESS:  This is a 53-YO male seen for discharge.  He will discharge home on 08/24/2016 with home health OT and PT.   He has been admitted to [facility] on  08/05/16 from hospital admission dates 07/30/16 thru 08/05/16 with acute on chronic systolic and diastolic heart failure. Echo revealed a dropped from 45-50% (2015) to 25-30%. Cardiology thought that the CHF exacerbation  was due to progressive CAD. He was started on Entresto. Hospital stay was complicated by elevated creatinine so IV diuresis was changed to oral Lasix. He has a PMH of CAD, diabetes, cardiomyopathy, dyslipidemia, HTN, GERD, chronic chest pain, complete heart block, and PAF for which he is on amiodarone.    Patient was admitted to this facility for short-term rehabilitation after the patient's recent hospitalization.  Patient has completed SNF rehabilitation and therapy has cleared the patient for discharge.   PAST MEDICAL HISTORY:  Past Medical History:  Diagnosis Date  . AC joint dislocation   . ALLERGIC RHINITIS   . Allergic rhinitis   . Anginal pain ([place])   . Arthritis   . Atrial tachycardia ([place])   . BPH (  benign prostatic hyperplasia)   . CAD   . CHF (congestive heart failure) ([place])   . Diarrhea   . DM    type 2  . GERD (gastroesophageal reflux disease)   . H/O: GI bleed   . HYPERCHOLESTEROLEMIA   . HYPERTENSION   . Iron deficiency anemia   . NEPHROLITHIASIS, HX OF   . Paroxysmal atrial fibrillation (HCC)   . Presence of permanent cardiac pacemaker 12/12/2013  . Rib fracture   . Stable angina (HCC)   . Syncope   . Thyroid disease   . TRANSIENT ISCHEMIC ATTACKS, HX OF   . 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 Urinary frequency   . Vertigo   . Weakness of left leg      CURRENT MEDICATIONS: Reviewed  Patient's Medications  New Prescriptions   No medications on file  Previous  Medications   ACETAMINOPHEN (TYLENOL) 325 MG TABLET    Take 650 mg by mouth every 6 (six) hours as needed for mild pain.   ALBUTEROL (PROVENTIL HFA;VENTOLIN HFA) 108 (90 BASE) MCG/ACT INHALER    Inhale 2 puffs into the lungs every 4 (four) hours as needed for wheezing or shortness of breath.    AMIODARONE (PACERONE) 100 MG TABLET    Take 1 tablet (100 mg total) by mouth daily.   ATORVASTATIN (LIPITOR) 10 MG TABLET    Take 1 tablet (10 mg total) by mouth daily.   BENZONATATE (TESSALON) 200 MG CAPSULE    Take 200 mg by mouth 3 (three) times daily as needed for cough.   CARVEDILOL (COREG) 12.5 MG TABLET    TAKE 1 TABLET BY MOUTH TWICE DAILY WITH A MEAL   CLOPIDOGREL (PLAVIX) 75 MG TABLET    Take 1 tablet (75 mg total) by mouth daily with breakfast.   COBALAMINE COMBINATIONS (VITAMIN B12-FOLIC ACID PO)    Take 1 tablet by mouth every Monday, Wednesday, and Friday.   DIVALPROEX (DEPAKOTE ER) 250 MG 24 HR TABLET    Take 250 mg by mouth at bedtime.   FERROUS SULFATE 324 (65 FE) MG TBEC    Take 1 tablet by mouth 2 (two) times daily.   FINASTERIDE (PROSCAR) 5 MG TABLET    Take 5 mg by mouth daily.    FUROSEMIDE (LASIX) 80 MG TABLET    Take 80 mg by mouth daily.   ISOSORBIDE MONONITRATE (IMDUR) 30 MG 24 HR TABLET    TAKE 1 TABLET BY MOUTH DAILY GENERIC EQUIVALENT FOR IMDUR   LEVOTHYROXINE (SYNTHROID, LEVOTHROID) 112 MCG TABLET    Take 112 mcg by mouth daily before breakfast.   MELATONIN PO    Take 10 mg by mouth at bedtime as needed.   NITROGLYCERIN (NITROSTAT) 0.4 MG SL TABLET    PLACE 1 TABLET (0.4 MG TOTAL) UNDER THE TONGUE EVERY 5 (FIVE) MINUTES AS NEEDED FOR CHEST PAIN.   SACCHAROMYCES BOULARDII (FLORASTOR) 250 MG CAPSULE    Take 250 mg by mouth 2 (two) times daily.   SACUBITRIL-VALSARTAN (ENTRESTO) 24-26 MG    Take 1 tablet by mouth 2 (two) times daily.   SITAGLIPTIN (JANUVIA) 25 MG TABLET    Take one tablet by mouth once daily to control blood sugar  Modified Medications   No medications on file    Discontinued Medications   No medications on file     Allergies  Allergen Reactions  . Clonidine Derivatives     unknown reaction   . Codeine  unknown reaction   . Meperidine Hcl     REACTION: Hypotension  . Motrin [Ibuprofen]     unknown reaction   . Other     Celery - unknown reaction  . Penicillins     unknown reaction   . Demerol Other (See Comments)    hypotension  . Heparin Other (See Comments)    hypotension  . Metformin And Related Diarrhea     REVIEW OF SYSTEMS:  GENERAL: no change in appetite, no fatigue, no weight changes, no fever, chills or weakness MOUTH and THROAT: Denies oral discomfort, gingival pain or bleeding, pain from teeth or hoarseness   RESPIRATORY: no cough, SOB, DOE, wheezing, hemoptysis CARDIAC: no chest pain, edema or palpitations GI: no abdominal pain, diarrhea, constipation, heart burn, nausea or vomiting GU: Denies dysuria, frequency, hematuria, incontinence, or discharge PSYCHIATRIC: Denies feeling of depression or anxiety. No report of hallucinations, insomnia, paranoia, or agitation    PHYSICAL EXAMINATION  GENERAL APPEARANCE: Well nourished. In no acute distress. Normal body habitus SKIN:  Skin is warm and dry.  EARS: bilateral ears deaf, has right hearing aid MOUTH and THROAT: Lips are without lesions. Oral mucosa is moist and without lesions. Tongue is normal in shape, size, and color and without lesions RESPIRATORY: breathing is even & unlabored, BS CTAB, has left chest pacemaker CARDIAC: RRR, + murmur,no extra heart sounds, no edema GI: abdomen soft, normal BS, no masses, no tenderness, no hepatomegaly, no splenomegaly EXTREMITIES:  Able to move X 4 extremities PSYCHIATRIC: Alert and oriented X 3. Affect and behavior are appropriate    LABS/RADIOLOGY: Labs reviewed: Basic Metabolic Panel:  Recent Labs  06/25/16 1056 06/26/16 0505  07/30/16 1617  08/03/16 0727 08/04/16 0410 08/05/16 0304  NA 141 142  < >   --   < > 139 139 139  K 3.5 3.5  < >  --   < > 3.8 3.6 3.4*  CL 106 106  < >  --   < > 105 105 104  CO2 26 27  < >  --   < > 23 22 24   GLUCOSE 159* 147*  < >  --   < > 137* 171* 162*  BUN 17 19  < >  --   < > 29* 29* 25*  CREATININE 1.52* 1.42*  < >  --   < > 1.75* 1.74* 1.77*  CALCIUM 8.3* 8.3*  < >  --   < > 8.2* 8.2* 8.1*  MG 2.2 2.4  --  2.1  --   --   --   --   PHOS 2.8 3.7  --  3.7  --   --   --   --   < > = values in this interval not displayed. Liver Function Tests:  Recent Labs  06/25/16 1056 06/26/16 0505 07/30/16 0852  AST 27 21 16   ALT 16* 15* 13*  ALKPHOS 54 54 65  BILITOT 0.7 0.9 1.4*  PROT 6.0* 5.9* 6.2*  ALBUMIN 3.6 3.5 3.5    Recent Labs  07/30/16 0852  LIPASE 40   CBC:  Recent Labs  06/23/16 1139  06/26/16 0505  07/30/16 0852 07/31/16 0340 08/01/16 0547 08/02/16 0504  WBC 7.1  < > 4.9  < > 6.5 6.8 6.8 6.1  NEUTROABS 5.5  --  3.5  --  5.6  --   --   --   HGB 8.0*  < > 9.7*  < > 9.0* 8.6* 8.2* 8.8*  HCT 24.4*  < > 29.8*  < > 28.9* 27.7* 27.8* 29.2*  MCV 81.1  < > 80.8  < > 76.9* 76.1* 77.0* 76.6*  PLT 188  < > 185  < > 216 238 244 215  < > = values in this interval not displayed. Cardiac Enzymes:  Recent Labs  06/25/16 1056 07/30/16 0852 07/30/16 1617  TROPONINI 3.30* 0.03* 0.04*   CBG:  Recent Labs  08/04/16 2130 08/05/16 0730 08/05/16 1136  GLUCAP 288* 164* 247*    Dg Chest 2 View  Result Date: 08/02/2016 CLINICAL DATA:  CHF EXAM: CHEST  2 VIEW COMPARISON:  07/31/2016 FINDINGS: Left pacer is unchanged. Prior CABG. Bilateral perihilar airspace opacities have improved since prior study. Small layering effusions. IMPRESSION: Mild pulmonary edema/ CHF, improving since prior study. Small effusions. Electronically Signed   By: Rolm Baptise M.D.   On: 08/02/2016 07:55   Dg Chest 2 View  Result Date: 07/31/2016 CLINICAL DATA:  Respiratory failure. EXAM: CHEST  2 VIEW COMPARISON:  07/30/2016. FINDINGS: Cardiac pacer in stable position.  Prior CABG. Cardiomegaly. Progressive bilateral pulmonary infiltrates consistent pulmonary edema. Tiny bilateral pleural effusions cannot be excluded. No pneumothorax . IMPRESSION: Cardiac pacer in stable position. Prior CABG. Congestive heart failure with continued progression of bilateral pulmonary edema. Tiny bilateral pleural effusions . Electronically Signed   By: Marcello Moores  Register   On: 07/31/2016 08:02   Dg Chest 2 View  Result Date: 07/30/2016 CLINICAL DATA:  Chronic shortness of breath.  Hypoxia. EXAM: CHEST  2 VIEW COMPARISON:  06/23/2016. FINDINGS: Cardiac pacer noted stable position. Prior CABG. Cardiomegaly again noted. Progressive bilateral pulmonary pulmonary interstitial infiltrates consistent pulmonary edema. Small bilateral pleural effusions. IMPRESSION: Cardiac pacer stable position. Prior CABG. Progressive changes of congestive heart failure with bilateral pulmonary interstitial edema and small pleural effusions . Electronically Signed   By: Marcello Moores  Register   On: 07/30/2016 10:52    ASSESSMENT/PLAN:  1. Physical deconditioning - for home health PT and OT, for therapeutic strengthening exercises; fall precautions  2. Acute on chronic combined systolic and diastolic ACC/AHA stage C congestive heart failure (HCC) - no SOB - albuterol (PROVENTIL HFA;VENTOLIN HFA) 108 (90 Base) MCG/ACT inhaler; Inhale 2 puffs into the lungs every 4 (four) hours as needed for wheezing or shortness of breath.  Dispense: 18 g; Refill: 0 - benzonatate (TESSALON) 200 MG capsule; Take 1 capsule (200 mg total) by mouth 3 (three) times daily as needed for cough.  Dispense: 30 capsule; Refill: 0 - furosemide (LASIX) 80 MG tablet; Take 1 tablet (80 mg total) by mouth daily.  Dispense: 30 tablet; Refill: 0 - isosorbide mononitrate (IMDUR) 30 MG 24 hr tablet; Take 1 tablet (30 mg total) by mouth daily.  Dispense: 30 tablet; Refill: 0 - sacubitril-valsartan (ENTRESTO) 24-26 MG; Take 1 tablet by mouth 2 (two) times  daily.  Dispense: 60 tablet; Refill: 0   3. Type II diabetes mellitus with nephropathy (HCC) Lab Results  Component Value Date   HGBA1C 7.8 08/07/2016   - sitaGLIPtin (JANUVIA) 25 MG tablet; Take 1 tablet (25 mg total) by mouth daily. Take one tablet by mouth once daily to control blood sugar  Dispense: 30 tablet; Refill: 0   4. Hypothyroidism, unspecified type Lab Results  Component Value Date   TSH 2.35 08/07/2016   - levothyroxine (SYNTHROID, LEVOTHROID) 112 MCG tablet; Take 1 tablet (112 mcg total) by mouth daily before breakfast.  Dispense: 30 tablet; Refill: 0   5. Coronary artery disease involving coronary  bypass graft without angina pectoris, unspecified whether native or transplanted heart - no complaints of chest pains - atorvastatin (LIPITOR) 10 MG tablet; Take 1 tablet (10 mg total) by mouth daily.  Dispense: 30 tablet; Refill: 0 - clopidogrel (PLAVIX) 75 MG tablet; Take 1 tablet (75 mg total) by mouth daily with breakfast.  Dispense: 90 tablet; Refill: 0 - isosorbide mononitrate (IMDUR) 30 MG 24 hr tablet; Take 1 tablet (30 mg total) by mouth daily.  Dispense: 30 tablet; Refill: 0 - nitroGLYCERIN (NITROSTAT) 0.4 MG SL tablet; PLACE 1 TABLET (0.4 MG TOTAL) UNDER THE TONGUE EVERY 5 (FIVE) MINUTES AS NEEDED FOR CHEST PAIN.  Dispense: 25 tablet; Refill: 0   6. PAF (paroxysmal atrial fibrillation) (HCC) - rate-controlled - amiodarone (PACERONE) 100 MG tablet; Take 1 tablet (100 mg total) by mouth daily.  Dispense: 30 tablet; Refill: 0 - carvedilol (COREG) 12.5 MG tablet; Take 1 tablet (12.5 mg total) by mouth 2 (two) times daily with a meal.  Dispense: 60 tablet; Refill: 0   7. Mood disorder (HCC) - mood is stable - divalproex (DEPAKOTE ER) 250 MG 24 hr tablet; Take 1 tablet (250 mg total) by mouth at bedtime.  Dispense: 30 tablet; Refill: 0   8. Insomnia, unspecified type - New melatonin 10 mg 1 tab daily at bedtime when necessary   9. Benign prostatic hyperplasia,  unspecified whether lower urinary tract symptoms present - stable, no complaints of urinary retention - finasteride (PROSCAR) 5 MG tablet; Take 1 tablet (5 mg total) by mouth daily.  Dispense: 30 tablet; Refill: 0     I have filled out patient's discharge paperwork and written prescriptions.  Patient will receive home health PT and OT.  DME provided:  None  Total discharge time: Greater than 30 minutes Greater than 50% was spent in counseling and coordination of care.   Discharge time involved coordination of the discharge process with social worker, nursing staff and therapy department. Medical justification for home health services verified.     Lazaro Isenhower C. Nash - NP    [employer] [phone]

## 2016-08-25 ENCOUNTER — Other Ambulatory Visit: Payer: Self-pay | Admitting: Cardiovascular Disease

## 2016-08-25 DIAGNOSIS — I480 Paroxysmal atrial fibrillation: Secondary | ICD-10-CM

## 2016-08-25 DIAGNOSIS — I5043 Acute on chronic combined systolic (congestive) and diastolic (congestive) heart failure: Secondary | ICD-10-CM

## 2016-08-25 DIAGNOSIS — I25810 Atherosclerosis of coronary artery bypass graft(s) without angina pectoris: Secondary | ICD-10-CM

## 2016-09-23 ENCOUNTER — Telehealth: Payer: Self-pay | Admitting: Cardiovascular Disease

## 2016-09-23 NOTE — Telephone Encounter (Signed)
Left message to call back  

## 2016-09-23 NOTE — Telephone Encounter (Signed)
Please clarify that he is on Lasix 80 mg a day.  If correct, ok to take an extra 40 mg for next 2 days.   If not correct, let me know.   Will check lab on return and otherwise, continue with current regimen.

## 2016-09-23 NOTE — Telephone Encounter (Signed)
Evan [name] with home health calling to give the provider an update on the patient. Patient has an appointment next week with [name]. Patient has gained 2 lbs in the last couple of days, he has 2 + edema BLE, and BP is 150/62. Patient has been taking his cardiac medications as prescribed. Will forward to [name] since she is see patient next week, for advisement.

## 2016-09-23 NOTE — Telephone Encounter (Signed)
New message    home health nurse just giving you and update on his condition    Pt c/o BP issue: STAT if pt c/o blurred vision, one-sided weakness or slurred speech  1. What are your last 5 BP readings?  150/62  2. Are you having any other symptoms (ex. Dizziness, headache, blurred vision, passed out)?  No   3. What is your BP issue? Fell Saturday , missed his chair  Pt c/o swelling: STAT is pt has developed SOB within 24 hours  1. How long have you been experiencing swelling? A month   2. Where is the swelling located? Ankles gained a couple lbs also  3.  Are you currently taking a "fluid pill"? Yes   4.  Are you currently SOB?  no  5.  Have you traveled recently? no

## 2016-09-23 NOTE — Telephone Encounter (Signed)
Called Alinda Sierras with Home Health back. Clarified again that patient is taking Lasix 80 mg daily. Informed Alinda Sierras to have patient take an extra Lasix 40 mg for the next two days. Informed her that we will be checking his labs at his office visit. Alinda Sierras verbalized understanding.

## 2016-09-24 ENCOUNTER — Other Ambulatory Visit: Payer: Self-pay | Admitting: Nurse Practitioner

## 2016-09-24 ENCOUNTER — Telehealth: Payer: Self-pay | Admitting: Cardiovascular Disease

## 2016-09-24 MED ORDER — FUROSEMIDE 40 MG PO TABS: ORAL_TABLET | ORAL | 0 refills | Status: DC

## 2016-09-24 NOTE — Telephone Encounter (Signed)
New message     Please call regarding conversation with physcial therapist, pt can not take extra dosage of lasix due to medication being prepackaged.

## 2016-09-24 NOTE — Telephone Encounter (Signed)
Called Anderson Malta with [facility] about patient. She stated patient's pills are pre bubble packed pills, so he is unable to take an extra dose of Lasix 40 mg for two days. Sent in prescription for Lasix 40 mg by mouth for two days to patient's pharmacy of choice. Anderson Malta will have patient take extra dose tomorrow and Saturday, as long as patient can get to pharmacy to pick up his mediations.

## 2016-09-25 ENCOUNTER — Telehealth: Payer: Self-pay | Admitting: Cardiovascular Disease

## 2016-09-25 MED ORDER — FUROSEMIDE 40 MG PO TABS: ORAL_TABLET | ORAL | 0 refills | Status: DC

## 2016-09-25 NOTE — Telephone Encounter (Signed)
New Message  Anderson Malta Nurse for [facility] call requesting to speak with RN about getting [facility]. Anderson Malta states she think it was sent to pt old pharmacy. Please call back to discuss

## 2016-09-25 NOTE — Telephone Encounter (Signed)
New message      Pt c/o medication issue:  1. Name of Medication: furosemide 2. How are you currently taking this medication (dosage and times per day)?   3. Are you having a reaction (difficulty breathing--STAT)?  no 4. What is your medication issue? Calling to clarify new dosage of lasix.  Also, calling to let doctor know it may be sat and sun before pt can take extra dosage.  Instructions stated pt start new dosage fri and sat. Please call     .

## 2016-09-25 NOTE — Telephone Encounter (Signed)
I called and spoke with Anderson Malta, RN with [facility]. I advised her orders were for an additional lasix 40 mg daily x 2 days. Per Anderson Malta, meds will not be delivered until between 3-6 pm today. I advised Anderson Malta that it is ok for the patient to taken the extra doses on Saturday and Sunday- this is what she was calling to try to clarify.

## 2016-09-29 ENCOUNTER — Ambulatory Visit (INDEPENDENT_AMBULATORY_CARE_PROVIDER_SITE_OTHER): Payer: Medicare Other | Admitting: Nurse Practitioner

## 2016-09-29 ENCOUNTER — Encounter (INDEPENDENT_AMBULATORY_CARE_PROVIDER_SITE_OTHER): Payer: Self-pay

## 2016-09-29 ENCOUNTER — Encounter: Payer: Self-pay | Admitting: Nurse Practitioner

## 2016-09-29 VITALS — BP 160/50 | HR 60 | Temp 97.7°F | Ht 67.0 in | Wt 157.4 lb

## 2016-09-29 DIAGNOSIS — I5042 Chronic combined systolic (congestive) and diastolic (congestive) heart failure: Secondary | ICD-10-CM

## 2016-09-29 DIAGNOSIS — I480 Paroxysmal atrial fibrillation: Secondary | ICD-10-CM

## 2016-09-29 DIAGNOSIS — I259 Chronic ischemic heart disease, unspecified: Secondary | ICD-10-CM | POA: Diagnosis not present

## 2016-09-29 LAB — CBC
Hematocrit: 28.1 % — ABNORMAL LOW (ref 37.5–51.0)
Hemoglobin: 9.4 g/dL — ABNORMAL LOW (ref 13.0–17.7)
MCH: 25.3 pg — ABNORMAL LOW (ref 26.6–33.0)
MCHC: 33.5 g/dL (ref 31.5–35.7)
MCV: 76 fL — ABNORMAL LOW (ref 79–97)
Platelets: 255 10*3/uL (ref 150–379)
RBC: 3.72 x10E6/uL — ABNORMAL LOW (ref 4.14–5.80)
RDW: 22 % — ABNORMAL HIGH (ref 12.3–15.4)
WBC: 4.6 10*3/uL (ref 3.4–10.8)

## 2016-09-29 LAB — BASIC METABOLIC PANEL
BUN/Creatinine Ratio: 12 (ref 10–24)
BUN: 19 mg/dL (ref 10–36)
CO2: 26 mmol/L (ref 20–29)
Calcium: 7.9 mg/dL — ABNORMAL LOW (ref 8.6–10.2)
Chloride: 103 mmol/L (ref 96–106)
Creatinine, Ser: 1.53 mg/dL — ABNORMAL HIGH (ref 0.76–1.27)
GFR calc Af Amer: 45 mL/min/{1.73_m2} — ABNORMAL LOW (ref >59)
GFR calc non Af Amer: 39 mL/min/{1.73_m2} — ABNORMAL LOW (ref >59)
Glucose: 150 mg/dL — ABNORMAL HIGH (ref 65–99)
Potassium: 3.2 mmol/L — ABNORMAL LOW (ref 3.5–5.2)
Sodium: 141 mmol/L (ref 134–144)

## 2016-09-29 NOTE — Progress Notes (Signed)
CARDIOLOGY OFFICE NOTE  Date:  09/29/2016    [name] Date of Birth: 25-Apr-1923 Medical Record #[id number]  PCP:  Deland Pretty, MD  Cardiologist:  Johnsie Cancel  Chief Complaint  Patient presents with  . Coronary Artery Disease    3 month check - seen for [name]    History of Present Illness: Evan [name] is a 83 y.o. male who presents today for a follow up visit. Seen for [name].   He is an elderly male with a history of CAD s/p CABG x 5V (1996) s/p DES to SVG--> OM1/2 (2015), DMT2, LV dysfunction (EF 45-50% in 2015), PAD, hypothyroidism, CHB s/p PPM, PAF, and PAT on amiodarone.    He was admitted in 2015 with syncope/bradycardia and chest pain. He was was cathed and had DES to the SVG to the OM - had 5/5 patent bypass grafts. Cath was followed by PPM implant per Dr. Rayann Heman - dual chamber device. Has chronic weakness in the legs/atrophy and has had frequent falls. He is on chronic amiodarone for PAF. He is not felt to be a good candidate for [place].   He was admitted 6/19-6/22/18. He presented with hematochezia and chest pain. Hg dropped from 12 to 8. He was transfused PRBCs. Troponin peaked at 8.21. He was not started on heparin. His ASA/plavix were discontinued given active bleeding. He was not felt to be stable for EGD given significant rise in troponin. His troponin trended down and Hg remained stable. GI cleared him for plavix and he was discharged home.   Seen back by Bonney Leitz, PA back for his post hospital visit - he was doing well. Minor chest pain but back walking about 1/2 mile a day. No further bleeding. Was to come back and see [name] in 3 months.   Readmitted back at the end of July with heart failure. Echo showed worsening EF - down to 25 to 30%. Norvasc was stopped. Entresto started. Seen by cardiology who felt the worsening EF was due to worsening CAD/recent MI, etc.   Phone calls earlier this month regarding weight gain - Lasix was increased  for a few days - this is somewhat difficult given that his medicines are all pre-bubble wrapped.   Comes in today. Here alone. Says he is "tired and worn out". Says he "barely made it here". Using a walker. Still driving some. Drove here today because he did not want to be ready at 8:30 for the facility to bring him. Says he is weak, lethargic - stays up all night and sleeps all day. Took his vitamins on an empty stomach and threw up already this morning. He then ate some breakfast and is not nauseated anymore today. He denies chest pain. Says his breathing is "not a problem". He still has swelling in his legs - he is unsure if it is better or not. He has wanted to "put on a few pounds". No bleeding that he is aware of.   Past Medical History:  Diagnosis Date  . AC joint dislocation   . ALLERGIC RHINITIS   . Allergic rhinitis   . Anginal pain ([facility])   . Arthritis   . Atrial tachycardia ([place])   . BPH (benign prostatic hyperplasia)   . CAD   . CHF (congestive heart failure) ([place])   . Diarrhea   . DM    type 2  . GERD (gastroesophageal reflux disease)   . H/O: GI bleed   .  HYPERCHOLESTEROLEMIA   . HYPERTENSION   . Iron deficiency anemia   . NEPHROLITHIASIS, HX OF   . Paroxysmal atrial fibrillation (H[facility])   . Presence of permanent cardiac pacemaker 12/12/2013  . Rib fracture   . Stable angina (H[facility])   . Syncope   . Thyroid disease   . TRANSIENT ISCHEMIC ATTACKS, HX OF   . 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 Urinary frequency   . Vertigo   . Weakness of left leg     Past Surgical History:  Procedure Laterality Date  . AV FISTULA REPAIR    . BACK SURGERY  2000'S  . CARDIAC CATHETERIZATION  12/11/2013   Procedure: CORONARY STENT INTERVENTION;  Surgeon: Burnell Blanks, MD;  Location: [facility] CATH LAB;  Service: Cardiovascular;;  DES 3.5 x 15 Resolute to seq SVG of OM1/OM2   . CORONARY ARTERY BYPASS GRAFT  1996  . CORONARY STENT PLACEMENT  12/11/13    SVG-OM DES  . FEMORAL ARTERY ANEURYSM REPAIR     feroral pseudo-aneurysm repair  . INSERT / REPLACE / REMOVE PACEMAKER    . LEFT HEART CATHETERIZATION WITH CORONARY/GRAFT ANGIOGRAM N/A 12/11/2013   Procedure: LEFT HEART CATHETERIZATION WITH Beatrix Fetters;  Surgeon: Burnell Blanks, MD;  Location: [facility] CATH LAB;  Service: Cardiovascular;  Laterality: N/A;  . PERMANENT PACEMAKER INSERTION N/A 12/12/2013   MDT ADDRL1 pacemaker implnated by Dr Rayann Heman  . PTCA  1980's     Medications: Current Meds  Medication Sig  . acetaminophen (TYLENOL) 325 MG tablet Take 650 mg by mouth every 6 (six) hours as needed for mild pain.  Marland Kitchen amiodarone (PACERONE) 100 MG tablet TAKE 1 TABLET BY MOUTH EVERY DAY  . atorvastatin (LIPITOR) 10 MG tablet Take 1 tablet (10 mg total) by mouth daily.  . benzonatate (TESSALON) 200 MG capsule Take 1 capsule (200 mg total) by mouth 3 (three) times daily as needed for cough.  . carvedilol (COREG) 12.5 MG tablet Take 1 tablet (12.5 mg total) by mouth 2 (two) times daily with a meal.  . clopidogrel (PLAVIX) 75 MG tablet TAKE 1 TABLET BY MOUTH EVERY DAY WITH BREAKFAST  . Cobalamine Combinations (VITAMIN B12-FOLIC ACID PO) Take 1 tablet by mouth every Monday, Wednesday, and Friday.  . divalproex (DEPAKOTE ER) 250 MG 24 hr tablet Take 1 tablet (250 mg total) by mouth at bedtime.  Marland Kitchen ENTRESTO 24-26 MG TAKE 1 TABLET BY MOUTH 2 TIMES DAILY  . ferrous sulfate 324 (65 Fe) MG TBEC Take 1 tablet by mouth 2 (two) times daily.  . finasteride (PROSCAR) 5 MG tablet Take 1 tablet (5 mg total) by mouth daily.  . folic acid (FOLVITE) 710 MCG tablet Take 400 mcg by mouth 3 (three) times a week. M, Wed,Fri  . furosemide (LASIX) 40 MG tablet Take 40 mg by mouth with your regular dose of Furosemide on 09/25/16 and 09/26/16  . furosemide (LASIX) 80 MG tablet Take 1 tablet (80 mg total) by mouth daily.  . isosorbide mononitrate (IMDUR) 30 MG 24 hr tablet Take 1 tablet (30 mg total) by mouth  daily.  Marland Kitchen levothyroxine (SYNTHROID, LEVOTHROID) 112 MCG tablet Take 1 tablet (112 mcg total) by mouth daily before breakfast.  . MELATONIN PO Take 10 mg by mouth at bedtime as needed.  . nitroGLYCERIN (NITROSTAT) 0.4 MG SL tablet PLACE 1 TABLET (0.4 MG TOTAL) UNDER THE TONGUE EVERY 5 (FIVE) MINUTES AS NEEDED FOR CHEST PAIN.  Marland Kitchen saccharomyces boulardii (FLORASTOR)  250 MG capsule Take 1 capsule (250 mg total) by mouth 2 (two) times daily.  . sitaGLIPtin (JANUVIA) 25 MG tablet Take 1 tablet (25 mg total) by mouth daily. Take one tablet by mouth once daily to control blood sugar  . [DISCONTINUED] albuterol (PROVENTIL HFA;VENTOLIN HFA) 108 (90 Base) MCG/ACT inhaler Inhale 2 puffs into the lungs every 4 (four) hours as needed for wheezing or shortness of breath.     Allergies: Allergies  Allergen Reactions  . Clonidine Derivatives     unknown reaction   . Codeine     unknown reaction   . Meperidine Hcl     REACTION: Hypotension  . Motrin [Ibuprofen]     unknown reaction   . Other     Celery - unknown reaction  . Penicillins     unknown reaction   . Demerol Other (See Comments)    hypotension  . Heparin Other (See Comments)    hypotension  . Metformin And Related Diarrhea    Social History: The patient  reports that he quit smoking about 60 years ago. He has never used smokeless tobacco. He reports that he does not drink alcohol or use drugs.   Family History: The patient's family history includes Coronary artery disease in his father; Diabetes in his mother.   Review of Systems: Please see the history of present illness.   Otherwise, the review of systems is positive for none.   All other systems are reviewed and negative.   Physical Exam: VS:  BP (!) 160/50 (BP Location: Left Arm, Patient Position: Sitting, Cuff Size: Normal)   Pulse 60   Temp 97.7 F (36.5 C)   Ht 5\' 7"  (1.702 m)   Wt 157 lb 6.4 oz (71.4 kg)   SpO2 (!) 85% Comment: with deep breaths/91  BMI 24.65 kg/m   .  BMI Body mass index is 24.65 kg/m.  Wt Readings from Last 3 Encounters:  09/29/16 157 lb 6.4 oz (71.4 kg)  08/24/16 150 lb 12.8 oz (68.4 kg)  08/06/16 155 lb 6.8 oz (70.5 kg)    General: Pleasant. Elderly male - chronically ill - quite pale. Alert and in no acute distress. He looks rather fatigued.  HEENT: Normal.  Neck: Supple, no JVD, carotid bruits, or masses noted.  Cardiac: Regular rate and rhythm. Harsh outflow murmur. 1 to 2+ edema.  Respiratory:  Lungs are clear to auscultation bilaterally with normal work of breathing.  GI: Soft and nontender.  MS: No deformity or atrophy. Gait and ROM intact. He is using a walker.  Skin: Warm and dry. Color is quite pale. Neuro:  Strength and sensation are intact and no gross focal deficits noted.  Psych: Alert, appropriate and with normal affect.   LABORATORY DATA:  EKG:  EKG is not ordered today.  Lab Results  Component Value Date   WBC 4.6 09/29/2016   HGB 9.4 (L) 09/29/2016   HCT 28.1 (L) 09/29/2016   PLT 255 09/29/2016   GLUCOSE 150 (H) 09/29/2016   ALT 13 (L) 07/30/2016   AST 16 07/30/2016   NA 141 09/29/2016   K 3.2 (L) 09/29/2016   CL 103 09/29/2016   CREATININE 1.53 (H) 09/29/2016   BUN 19 09/29/2016   CO2 26 09/29/2016   TSH 2.35 08/07/2016   INR 1.08 06/24/2016   HGBA1C 7.8 08/07/2016     BNP (last 3 results)  Recent Labs  07/30/16 0852  BNP 1,276.9*   ProBNP (last 3 results) No results for  input(s): PROBNP in the last 8760 hours.   Other Studies Reviewed Today:  TTE: 07/30/16  Study Conclusions  - Left ventricle: The cavity size was normal. Systolic function was severely reduced. The estimated ejection fraction was in the range of 25% to 30%. Severe diffuse hypokinesis with no identifiable regional variations. There is substantial systolic dyssynchrony, both septal-to-lateral wall and apex-to-base. Doppler parameters are consistent with restrictive physiology, indicative of  decreased left ventricular diastolic compliance and/or increased left atrial pressure. Acoustic contrast opacification revealed no evidence ofthrombus. - Ventricular septum: Septal motion showed abnormal function, dyssynergy, and paradox. These changes are consistent with right ventricular pacing. - Aortic valve: Noncoronary cusp mobility was severely restricted. Transvalvular velocity was increased less than expected, due to low cardiac output. There was mild stenosis. There was mild regurgitation directed centrally in the LVOT. Valve area (VTI): 1.41 cm^2. - Mitral valve: Calcified annulus. There was mild to moderate regurgitation directed centrally. - Left atrium: The atrium was moderately dilated. - Right ventricle: Systolic function was mildly reduced. - Tricuspid valve: There was moderate regurgitation. - Pulmonary arteries: Systolic pressure was moderately increased. PA peak pressure: 63 mm Hg (S).   Assessment/Plan:  1. CAD s/p CABG and PCI: with NSTEMI back this past summer (possibly type II) in the setting of acute anemia from hematochezia. GI cleared him for plavix. He remains on plavix, statin and BB. No active chest pain reported. He has had recent fall in his EF - suspected to be related to worsening CAD - medical management recommended.   2. Prior GIB: no more blood in stool. Looks quite pale/poorly to me today - he says he is weak and lethargic - checking stat labs today - actually ok. He is pretty adamant about not being readmitted to the hospital. Will keep him on his current regimen.   3. Acute on chronic combined S/D CHF: recent increase in his weight which was treated with extra Lasix for 2 days - need labs - may need further adjustment in his medicines. Most recent echo with worsening EF - now on Entresto. Checking labs today - I am worried that he is more anemic. He tells me at the end of the visit that he has been drinking tons of Gatorade -  would advise against this.   4. CKD: rechecked his lab today  5. PAF/PAT: he is on low dose amio, followed by Dr. Rayann Heman. Not felt to be a good candidate for [place]  6. CHB s/p PPM: followed by Dr. Rayann Heman  Current medicines are reviewed with the patient today.  The patient does not have concerns regarding medicines other than what has been noted above.  The following changes have been made:  See above.  Labs/ tests ordered today include:    Orders Placed This Encounter  Procedures  . Basic metabolic panel  . CBC    Disposition:   His lab today is stable. No changes made in his care - other than his fatigue - he has no other symptoms - will keep follow up planned for next month.   Patient is agreeable to this plan and will call if any problems develop in the interim.   SignedTruitt Merle, NP  09/29/2016 12:21 PM  [facility] [address] [place], [place]  [address] Phone: [phone] Fax: [phone]

## 2016-09-29 NOTE — Patient Instructions (Addendum)
We will be checking the following labs today - BMET and CBC   Medication Instructions:    Continue with your current medicines for now.     Testing/Procedures To Be Arranged:  N/A  Follow-Up:   See [name] as planned next month.    Other Special Instructions:   N/A    If you need a refill on your cardiac medications before your next appointment, please call your pharmacy.   Call the [place] office at [phone] if you have any questions, problems or concerns.

## 2016-09-30 ENCOUNTER — Telehealth: Payer: Self-pay | Admitting: Nurse Practitioner

## 2016-09-30 NOTE — Telephone Encounter (Signed)
New message    Anderson Malta from [facility] is calling to find out if at appt yesterday, did pt get any new orders. She said she is trying to figure out if she needs to continue nursing for this pt.

## 2016-09-30 NOTE — Telephone Encounter (Signed)
Made Anderson Malta aware that there were no changes at patient's appointment with [facility] yesterday. Anderson Malta states that she know his HH is ordered through patient's PCP but wanted to check to see if cardiology has any additional nursing education recommendations. She states that patient has already been educated on BG checks, daily weights, and states that he receives his medications pre-packaged.

## 2016-09-30 NOTE — Telephone Encounter (Signed)
Left message for Jennifer to call back

## 2016-10-15 ENCOUNTER — Telehealth: Payer: Self-pay | Admitting: Cardiology

## 2016-10-15 ENCOUNTER — Encounter: Payer: Medicare Other | Admitting: *Deleted

## 2016-10-15 NOTE — Telephone Encounter (Signed)
LMOVM reminding pt to send remote transmission.   

## 2016-10-16 ENCOUNTER — Encounter: Payer: Self-pay | Admitting: Cardiology

## 2016-10-16 NOTE — Progress Notes (Signed)
CARDIOLOGY OFFICE NOTE  Date:  10/20/2016    Evan [name] Date of Birth: May 04, 1923 Medical Record #[id number]  PCP:  Deland Pretty, MD  Cardiologist:  Johnsie Cancel  No chief complaint on file.   History of Present Illness: Evan [name] is a 83 y.o. male who presents today for a follow up visit. Seen for [name].   He is an elderly male with a history of CAD s/p CABG x 5V (1996) s/p DES to SVG--> OM1/2 (2015), DMT2, LV dysfunction (EF 45-50% in 2015), PAD, hypothyroidism, CHB s/p PPM, PAF, and PAT on amiodarone.    He was admitted in 2015 with syncope/bradycardia and chest pain. He was was cathed and had DES to the SVG to the OM - had 5/5 patent bypass grafts. Cath was followed by PPM implant per Dr. Rayann Heman - dual chamber device. Has chronic weakness in the legs/atrophy and has had frequent falls. He is on chronic amiodarone for PAF. He is not felt to be a good candidate for [place].   He was admitted 6/19-6/22/18. He presented with hematochezia and chest pain. Hg dropped from 12 to 8. He was transfused PRBCs. Troponin peaked at 8.21. He was not started on heparin. His ASA/plavix were discontinued given active bleeding. He was not felt to be stable for EGD given significant rise in troponin. His troponin trended down and Hg remained stable. GI cleared him for plavix and he was discharged home.   Seen back by Bonney Leitz, PA back for his post hospital visit - he was doing well. Minor chest pain but back walking about 1/2 mile a day. No further bleeding. Was to come back and see [name] in 3 months.   Readmitted back at the end of July with heart failure. Echo showed worsening EF - down to 25 to 30%. Norvasc was stopped. Entresto started. Seen by cardiology who felt the worsening EF was due to worsening CAD/recent MI, etc.   Not sure of his lasix dose but more LE edema Believes he is taking 80/40   Past Medical History:  Diagnosis Date  . AC joint dislocation   .  ALLERGIC RHINITIS   . Allergic rhinitis   . Anginal pain ([place])   . Arthritis   . Atrial tachycardia ([place])   . BPH (benign prostatic hyperplasia)   . CAD   . CHF (congestive heart failure) ([place])   . Diarrhea   . DM    type 2  . GERD (gastroesophageal reflux disease)   . H/O: GI bleed   . HYPERCHOLESTEROLEMIA   . HYPERTENSION   . Iron deficiency anemia   . NEPHROLITHIASIS, HX OF   . Paroxysmal atrial fibrillation (HCC)   . Presence of permanent cardiac pacemaker 12/12/2013  . Rib fracture   . Stable angina (HCC)   . Syncope   . Thyroid disease   . TRANSIENT ISCHEMIC ATTACKS, HX OF   . 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 Urinary frequency   . Vertigo   . Weakness of left leg     Past Surgical History:  Procedure Laterality Date  . AV FISTULA REPAIR    . BACK SURGERY  2000'S  . CARDIAC CATHETERIZATION  12/11/2013   Procedure: CORONARY STENT INTERVENTION;  Surgeon: Burnell Blanks, MD;  Location: [facility] CATH LAB;  Service: Cardiovascular;;  DES 3.5 x 15 [place] to seq SVG of OM1/OM2   . CORONARY ARTERY BYPASS GRAFT  [place]  12/11/13   SVG-OM DES  . FEMORAL ARTERY ANEURYSM REPAIR     feroral pseudo-aneurysm repair  . INSERT / REPLACE / REMOVE PACEMAKER    . LEFT HEART CATHETERIZATION WITH CORONARY/GRAFT ANGIOGRAM N/A 12/11/2013   Procedure: LEFT HEART CATHETERIZATION WITH Beatrix Fetters;  Surgeon: Burnell Blanks, MD;  Location: [facility] CATH LAB;  Service: Cardiovascular;  Laterality: N/A;  . PERMANENT PACEMAKER INSERTION N/A 12/12/2013   MDT ADDRL1 pacemaker implnated by Dr Rayann Heman  . PTCA  1980's     Medications: Current Meds  Medication Sig  . acetaminophen (TYLENOL) 325 MG tablet Take 650 mg by mouth every 6 (six) hours as needed for mild pain.  Marland Kitchen amiodarone (PACERONE) 100 MG tablet TAKE 1 TABLET BY MOUTH EVERY DAY  . atorvastatin (LIPITOR) 10 MG tablet Take 1 tablet (10 mg total) by mouth daily.    . benzonatate (TESSALON) 200 MG capsule Take 1 capsule (200 mg total) by mouth 3 (three) times daily as needed for cough.  . carvedilol (COREG) 12.5 MG tablet Take 1 tablet (12.5 mg total) by mouth 2 (two) times daily with a meal.  . clopidogrel (PLAVIX) 75 MG tablet TAKE 1 TABLET BY MOUTH EVERY DAY WITH BREAKFAST  . Cobalamine Combinations (VITAMIN B12-FOLIC ACID PO) Take 1 tablet by mouth every Monday, Wednesday, and Friday.  . divalproex (DEPAKOTE ER) 250 MG 24 hr tablet Take 1 tablet (250 mg total) by mouth at bedtime.  Marland Kitchen ENTRESTO 24-26 MG TAKE 1 TABLET BY MOUTH 2 TIMES DAILY  . ferrous sulfate 324 (65 Fe) MG TBEC Take 1 tablet by mouth 2 (two) times daily.  . finasteride (PROSCAR) 5 MG tablet Take 1 tablet (5 mg total) by mouth daily.  . folic acid (FOLVITE) 381 MCG tablet Take 400 mcg by mouth 3 (three) times a week. M, Wed,Fri  . furosemide (LASIX) 80 MG tablet Take 1 tablet (80 mg total) by mouth daily.  . isosorbide mononitrate (IMDUR) 30 MG 24 hr tablet Take 1 tablet (30 mg total) by mouth daily.  Marland Kitchen levothyroxine (SYNTHROID, LEVOTHROID) 112 MCG tablet Take 1 tablet (112 mcg total) by mouth daily before breakfast.  . MELATONIN PO Take 10 mg by mouth at bedtime as needed.  . nitroGLYCERIN (NITROSTAT) 0.4 MG SL tablet PLACE 1 TABLET (0.4 MG TOTAL) UNDER THE TONGUE EVERY 5 (FIVE) MINUTES AS NEEDED FOR CHEST PAIN.  Marland Kitchen saccharomyces boulardii (FLORASTOR) 250 MG capsule Take 1 capsule (250 mg total) by mouth 2 (two) times daily.  . sitaGLIPtin (JANUVIA) 25 MG tablet Take 1 tablet (25 mg total) by mouth daily. Take one tablet by mouth once daily to control blood sugar  . [DISCONTINUED] furosemide (LASIX) 40 MG tablet Take 40 mg by mouth with your regular dose of Furosemide on 09/25/16 and 09/26/16     Allergies: Allergies  Allergen Reactions  . Clonidine Derivatives     unknown reaction   . Codeine     unknown reaction   . Meperidine Hcl     REACTION: Hypotension  . Motrin [Ibuprofen]      unknown reaction   . Other     Celery - unknown reaction  . Penicillins     unknown reaction   . Demerol Other (See Comments)    hypotension  . Heparin Other (See Comments)    hypotension  . Metformin And Related Diarrhea    Social History: The patient  reports that he quit smoking about 60 years ago.  He has never used smokeless tobacco. He reports that he does not drink alcohol or use drugs.   Family History: The patient's family history includes Coronary artery disease in his father; Diabetes in his mother.   Review of Systems: Please see the history of present illness.   Otherwise, the review of systems is positive for none.   All other systems are reviewed and negative.   Physical Exam: VS:  BP (!) 140/55   Pulse 66   Ht 5\' 7"  (1.702 m)   Wt 156 lb 6.4 oz (70.9 kg)   SpO2 94%   BMI 24.50 kg/m  .  BMI Body mass index is 24.5 kg/m.  Wt Readings from Last 3 Encounters:  10/20/16 156 lb 6.4 oz (70.9 kg)  09/29/16 157 lb 6.4 oz (71.4 kg)  08/24/16 150 lb 12.8 oz (68.4 kg)    Affect appropriate Chronically ill white male  HEENT: normal Neck supple with no adenopathy JVP normal no bruits no thyromegaly Lungs clear with no wheezing and good diaphragmatic motion Heart:  S1/S2 SEM murmur, no rub, gallop or click PMI enlarged  Abdomen: benighn, BS positve, no tenderness, no AAA no bruit.  No HSM or HJR Distal pulses intact with no bruits Plus 2 LE edema Neuro non-focal Skin warm and dry No muscular weakness   LABORATORY DATA:  EKG:  EKG is not ordered today.  Lab Results  Component Value Date   WBC 4.6 09/29/2016   HGB 9.4 (L) 09/29/2016   HCT 28.1 (L) 09/29/2016   PLT 255 09/29/2016   GLUCOSE 150 (H) 09/29/2016   ALT 13 (L) 07/30/2016   AST 16 07/30/2016   NA 141 09/29/2016   K 3.2 (L) 09/29/2016   CL 103 09/29/2016   CREATININE 1.53 (H) 09/29/2016   BUN 19 09/29/2016   CO2 26 09/29/2016   TSH 2.35 08/07/2016   INR 1.08 06/24/2016   HGBA1C  7.8 08/07/2016     BNP (last 3 results)  Recent Labs  07/30/16 0852  BNP 1,276.9*   ProBNP (last 3 results) No results for input(s): PROBNP in the last 8760 hours.   Other Studies Reviewed Today:  TTE: 07/30/16  Study Conclusions  - Left ventricle: The cavity size was normal. Systolic function was severely reduced. The estimated ejection fraction was in the range of 25% to 30%. Severe diffuse hypokinesis with no identifiable regional variations. There is substantial systolic dyssynchrony, both septal-to-lateral wall and apex-to-base. Doppler parameters are consistent with restrictive physiology, indicative of decreased left ventricular diastolic compliance and/or increased left atrial pressure. Acoustic contrast opacification revealed no evidence ofthrombus. - Ventricular septum: Septal motion showed abnormal function, dyssynergy, and paradox. These changes are consistent with right ventricular pacing. - Aortic valve: Noncoronary cusp mobility was severely restricted. Transvalvular velocity was increased less than expected, due to low cardiac output. There was mild stenosis. There was mild regurgitation directed centrally in the LVOT. Valve area (VTI): 1.41 cm^2. - Mitral valve: Calcified annulus. There was mild to moderate regurgitation directed centrally. - Left atrium: The atrium was moderately dilated. - Right ventricle: Systolic function was mildly reduced. - Tricuspid valve: There was moderate regurgitation. - Pulmonary arteries: Systolic pressure was moderately increased. PA peak pressure: 63 mm Hg (S).   Assessment/Plan:  1. CAD s/p CABG 1995 and PCI SVG OM in 2015 : with NSTEMI   (possibly type II) in the setting of acute anemia from hematochezia. Medical Rx given age and co morbidities   2. Prior GIB:  Hct 28.1 09/29/16 f/u with primary   3. Acute on chronic combined S/D CHF: EF 25-30% clinically improved on entresto  appears volume  Overloaded with more edema Increase lasix to 80 bid and add zaroxyln 5 mg M/W/Friday f/u  BMET 3 weeks with PA visit and f/u with me next available   4. CKD: stable  Lab Results  Component Value Date   CREATININE 1.53 (H) 09/29/2016   BUN 19 09/29/2016   NA 141 09/29/2016   K 3.2 (L) 09/29/2016   CL 103 09/29/2016   CO2 26 09/29/2016     5. PAF/PAT: he is on low dose amio, followed by Dr. Rayann Heman. Not felt to be a good candidate for [facility] due to age   75. CHB s/p PPM: followed by Dr. Rayann Heman normal function   Jenkins Rouge

## 2016-10-20 ENCOUNTER — Ambulatory Visit (INDEPENDENT_AMBULATORY_CARE_PROVIDER_SITE_OTHER): Payer: Medicare Other | Admitting: Cardiovascular Disease

## 2016-10-20 ENCOUNTER — Encounter: Payer: Self-pay | Admitting: Cardiovascular Disease

## 2016-10-20 VITALS — BP 140/55 | HR 66 | Ht 67.0 in | Wt 156.4 lb

## 2016-10-20 DIAGNOSIS — I10 Essential (primary) hypertension: Secondary | ICD-10-CM | POA: Diagnosis not present

## 2016-10-20 DIAGNOSIS — Z79899 Other long term (current) drug therapy: Secondary | ICD-10-CM

## 2016-10-20 DIAGNOSIS — I5043 Acute on chronic combined systolic (congestive) and diastolic (congestive) heart failure: Secondary | ICD-10-CM | POA: Diagnosis not present

## 2016-10-20 MED ORDER — FUROSEMIDE 80 MG PO TABS: 80.0000 mg | ORAL_TABLET | Freq: Two times a day (BID) | ORAL | 6 refills | Status: DC

## 2016-10-20 NOTE — Patient Instructions (Addendum)
Medication Instructions:  Your physician has recommended you make the following change in your medication:  1-START Furosemide 80 mg by mouth twice daily.    Lab work: Your physician recommends that you return for lab work in: 1 weeks for BMET   Testing/Procedures: NONE  Follow-Up: Your physician wants you to follow-up in: next available with [name].  Your physician recommends that you schedule a follow-up appointment in: 3 to 4 weeks with PA/NP   If you need a refill on your cardiac medications before your next appointment, please call your pharmacy.

## 2016-10-27 ENCOUNTER — Other Ambulatory Visit: Payer: Medicare Other

## 2016-10-27 DIAGNOSIS — I10 Essential (primary) hypertension: Secondary | ICD-10-CM

## 2016-10-27 DIAGNOSIS — Z79899 Other long term (current) drug therapy: Secondary | ICD-10-CM

## 2016-10-28 ENCOUNTER — Telehealth: Payer: Self-pay | Admitting: Cardiovascular Disease

## 2016-10-28 LAB — BASIC METABOLIC PANEL
BUN/Creatinine Ratio: 14 (ref 10–24)
BUN: 18 mg/dL (ref 10–36)
CHLORIDE: 97 mmol/L (ref 96–106)
CO2: 26 mmol/L (ref 20–29)
Calcium: 8.8 mg/dL (ref 8.6–10.2)
Creatinine, Ser: 1.29 mg/dL — ABNORMAL HIGH (ref 0.76–1.27)
GFR calc Af Amer: 56 mL/min/{1.73_m2} — ABNORMAL LOW (ref >59)
GFR, EST NON AFRICAN AMERICAN: 48 mL/min/{1.73_m2} — AB (ref >59)
GLUCOSE: 114 mg/dL — AB (ref 65–99)
POTASSIUM: 3.8 mmol/L (ref 3.5–5.2)
SODIUM: 139 mmol/L (ref 134–144)

## 2016-10-28 NOTE — Telephone Encounter (Signed)
Left message for patient to call back  

## 2016-10-28 NOTE — Telephone Encounter (Signed)
New message ° ° ° ° °Patient returning call for results. Please call °

## 2016-11-16 NOTE — Telephone Encounter (Signed)
Left several message to inform patient about his lab results. Mailed copy of results to patient today.

## 2016-11-17 ENCOUNTER — Encounter: Payer: Self-pay | Admitting: Physician Assistant

## 2016-11-17 ENCOUNTER — Ambulatory Visit: Payer: Medicare Other | Admitting: Physician Assistant

## 2016-11-17 ENCOUNTER — Encounter (INDEPENDENT_AMBULATORY_CARE_PROVIDER_SITE_OTHER): Payer: Self-pay

## 2016-11-17 VITALS — BP 128/50 | HR 71 | Ht 67.0 in | Wt 153.4 lb

## 2016-11-17 DIAGNOSIS — I10 Essential (primary) hypertension: Secondary | ICD-10-CM | POA: Diagnosis not present

## 2016-11-17 DIAGNOSIS — I25810 Atherosclerosis of coronary artery bypass graft(s) without angina pectoris: Secondary | ICD-10-CM | POA: Diagnosis not present

## 2016-11-17 DIAGNOSIS — I480 Paroxysmal atrial fibrillation: Secondary | ICD-10-CM

## 2016-11-17 DIAGNOSIS — I5043 Acute on chronic combined systolic (congestive) and diastolic (congestive) heart failure: Secondary | ICD-10-CM

## 2016-11-17 DIAGNOSIS — Z950 Presence of cardiac pacemaker: Secondary | ICD-10-CM

## 2016-11-17 LAB — BASIC METABOLIC PANEL
BUN / CREAT RATIO: 12 (ref 10–24)
BUN: 16 mg/dL (ref 10–36)
CHLORIDE: 103 mmol/L (ref 96–106)
CO2: 22 mmol/L (ref 20–29)
Calcium: 9.1 mg/dL (ref 8.6–10.2)
Creatinine, Ser: 1.36 mg/dL — ABNORMAL HIGH (ref 0.76–1.27)
GFR, EST AFRICAN AMERICAN: 52 mL/min/{1.73_m2} — AB (ref >59)
GFR, EST NON AFRICAN AMERICAN: 45 mL/min/{1.73_m2} — AB (ref >59)
Glucose: 176 mg/dL — ABNORMAL HIGH (ref 65–99)
POTASSIUM: 4.5 mmol/L (ref 3.5–5.2)
SODIUM: 142 mmol/L (ref 134–144)

## 2016-11-17 MED ORDER — FUROSEMIDE 80 MG PO TABS: ORAL_TABLET | ORAL | 3 refills | Status: DC

## 2016-11-17 NOTE — Progress Notes (Signed)
Cardiology Office Note    Date:  11/17/2016   ID:  Evan [name], DOB 1923-02-04, MRN [id number]  PCP:  Deland Pretty, MD  Cardiologist: [name]  Chief Complaint  Patient presents with  . Follow-up    History of Present Illness:  Evan [name] is a 83 y.o. male with history of CAD status post CABG x5 in 1996, DES to SVG to OM 1/2 in 2015, LV dysfunction ejection fraction 25-30% 07/2016, PAF and PAT on amiodarone, PAD, complete heart block status post pacemaker, diabetes mellitus type 2 and hypothyroidism.  Patient has chronic weakness in the legs with atrophy and frequent falls.  He is not felt to be a good candidate for anticoagulation.  He had an admission 06/2016 with hematochezia and chest pain.  Hemoglobin was 8 and NSTEMI troponin peaked at 8.21.  He was not started on heparin.  Aspirin and Plavix were discontinued given active bleeding.  Readmitted 07/2016 with CHF Echo showed worsened ejection fraction 25-30%.  Norvasc was stopped and Entresto started.  Saw [name] 10/20/16 and had worsening lower extremity edema.  Lasix increased to 80 mg twice daily and Zaroxolyn 5 mg added Monday Wednesday Friday.  Creatinine 1.29 on 10/27/16.  Patient comes in today by himself.  He does not remember taking the Zaroxolyn and when we called from the pharmacy it was not on his list.  They prepare his medication for him.  He reduced his Lasix to 80 mg once daily because he was up urinating all night long when he was on it twice daily.  His swelling has improved and his breathing is better.  His weight is down 3 pounds.  He still has some edema.  He lives at [place] and eats prepared food there.  He does not add extra salt.  Past Medical History:  Diagnosis Date  . AC joint dislocation   . ALLERGIC RHINITIS   . Allergic rhinitis   . Anginal pain ([place])   . Arthritis   . Atrial tachycardia ([place])   . BPH (benign prostatic hyperplasia)   . CAD   . CHF (congestive heart failure)  ([place])   . Diarrhea   . DM    type 2  . GERD (gastroesophageal reflux disease)   . H/O: GI bleed   . HYPERCHOLESTEROLEMIA   . HYPERTENSION   . Iron deficiency anemia   . NEPHROLITHIASIS, HX OF   . Paroxysmal atrial fibrillation (HCC)   . Presence of permanent cardiac pacemaker 12/12/2013  . Rib fracture   . Stable angina (HCC)   . Syncope   . Thyroid disease   . TRANSIENT ISCHEMIC ATTACKS, HX OF   . 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 Urinary frequency   . Vertigo   . Weakness of left leg     Past Surgical History:  Procedure Laterality Date  . AV FISTULA REPAIR    . BACK SURGERY  2000'S  . CORONARY ARTERY BYPASS GRAFT  1996  . CORONARY STENT PLACEMENT  12/11/13   SVG-OM DES  . FEMORAL ARTERY ANEURYSM REPAIR     feroral pseudo-aneurysm repair  . INSERT / REPLACE / REMOVE PACEMAKER    . PTCA  1980's    Current Medications: Current Meds  Medication Sig  . acetaminophen (TYLENOL) 325 MG tablet Take 650 mg by mouth every 6 (six) hours as needed for mild pain.  Marland Kitchen amiodarone (PACERONE) 100 MG tablet TAKE 1 TABLET  BY MOUTH EVERY DAY  . atorvastatin (LIPITOR) 10 MG tablet Take 1 tablet (10 mg total) by mouth daily.  . benzonatate (TESSALON) 200 MG capsule Take 1 capsule (200 mg total) by mouth 3 (three) times daily as needed for cough.  . carvedilol (COREG) 12.5 MG tablet Take 1 tablet (12.5 mg total) by mouth 2 (two) times daily with a meal.  . clopidogrel (PLAVIX) 75 MG tablet TAKE 1 TABLET BY MOUTH EVERY DAY WITH BREAKFAST  . Cobalamine Combinations (VITAMIN B12-FOLIC ACID PO) Take 1 tablet by mouth every Monday, Wednesday, and Friday.  . divalproex (DEPAKOTE ER) 250 MG 24 hr tablet Take 1 tablet (250 mg total) by mouth at bedtime.  Marland Kitchen ENTRESTO 24-26 MG TAKE 1 TABLET BY MOUTH 2 TIMES DAILY  . ferrous sulfate 324 (65 Fe) MG TBEC Take 1 tablet by mouth 2 (two) times daily.  . finasteride (PROSCAR) 5 MG tablet Take 1 tablet (5 mg total) by mouth  daily.  . folic acid (FOLVITE) 696 MCG tablet Take 400 mcg by mouth 3 (three) times a week. M, Wed,Fri  . isosorbide mononitrate (IMDUR) 30 MG 24 hr tablet Take 1 tablet (30 mg total) by mouth daily.  Marland Kitchen levothyroxine (SYNTHROID, LEVOTHROID) 112 MCG tablet Take 1 tablet (112 mcg total) by mouth daily before breakfast.  . MELATONIN PO Take 10 mg by mouth at bedtime as needed.  . nitroGLYCERIN (NITROSTAT) 0.4 MG SL tablet PLACE 1 TABLET (0.4 MG TOTAL) UNDER THE TONGUE EVERY 5 (FIVE) MINUTES AS NEEDED FOR CHEST PAIN.  Marland Kitchen saccharomyces boulardii (FLORASTOR) 250 MG capsule Take 1 capsule (250 mg total) by mouth 2 (two) times daily.  . sitaGLIPtin (JANUVIA) 25 MG tablet Take 1 tablet (25 mg total) by mouth daily. Take one tablet by mouth once daily to control blood sugar  . [DISCONTINUED] furosemide (LASIX) 80 MG tablet Take 1 tablet (80 mg total) by mouth 2 (two) times daily.     Allergies:   Clonidine derivatives; Codeine; Meperidine hcl; Motrin [ibuprofen]; Other; Penicillins; Demerol; Heparin; and Metformin and related   Social History   Socioeconomic History  . Marital status: Widowed    Spouse name: None  . Number of children: None  . Years of education: None  . Highest education level: None  Social Needs  . Financial resource strain: None  . Food insecurity - worry: None  . Food insecurity - inability: None  . Transportation needs - medical: None  . Transportation needs - non-medical: None  Occupational History  . None  Tobacco Use  . Smoking status: Former Smoker    Last attempt to quit: 01/06/1956    Years since quitting: 60.9  . Smokeless tobacco: Never Used  Substance and Sexual Activity  . Alcohol use: No    Alcohol/week: 0.0 oz  . Drug use: No  . Sexual activity: Not Currently  Other Topics Concern  . None  Social History Narrative   Pt is married and lives with his wife.  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        Family History:  The patient's family history includes Coronary artery disease in his father; Diabetes in his mother.   ROS:   Please see the history of present illness.    Review of Systems  Constitution: Negative.  HENT: Negative.   Cardiovascular: Positive for leg swelling.  Respiratory: Negative.   Endocrine: Negative.   Hematologic/Lymphatic: Negative.   Musculoskeletal: Negative.  Gastrointestinal: Negative.   Genitourinary: Negative.   Neurological: Negative.    All other systems reviewed and are negative.   PHYSICAL EXAM:   VS:  BP (!) 128/50   Pulse 71   Ht 5\' 7"  (1.702 m)   Wt 153 lb 6.4 oz (69.6 kg)   SpO2 98%   BMI 24.03 kg/m   Physical Exam  GEN: Well nourished, well developed, in no acute distress  Neck: Slight increase JVD, no carotid bruits, or masses Cardiac:RRR; 2/6 systolic murmur at the left sternal border Respiratory:  clear to auscultation bilaterally, normal work of breathing GI: soft, nontender, nondistended, + BS Ext: +1-2 edema up to his knees without cyanosis, clubbing, Good distal pulses bilaterally Neuro:  Alert and Oriented x 3 Psych: euthymic mood, full affect  Wt Readings from Last 3 Encounters:  11/17/16 153 lb 6.4 oz (69.6 kg)  10/20/16 156 lb 6.4 oz (70.9 kg)  09/29/16 157 lb 6.4 oz (71.4 kg)      Studies/Labs Reviewed:   EKG:  EKG is not ordered today.   Recent Labs: 07/30/2016: ALT 13; B Natriuretic Peptide 1,276.9; Magnesium 2.1 08/07/2016: TSH 2.35 09/29/2016: Hemoglobin 9.4; Platelets 255 10/27/2016: BUN 18; Creatinine, Ser 1.29; Potassium 3.8; Sodium 139   Lipid Panel No results found for: CHOL, TRIG, HDL, CHOLHDL, VLDL, LDLCALC, LDLDIRECT  Additional studies/ records that were reviewed today include:  2D echo 7/2018Study Conclusions   - Left ventricle: The cavity size was normal. Systolic function was   severely reduced. The estimated ejection fraction was in the   range of 25% to 30%. Severe diffuse hypokinesis  with no   identifiable regional variations. There is substantial systolic   dyssynchrony, both septal-to-lateral wall and apex-to-base.   Doppler parameters are consistent with restrictive physiology,   indicative of decreased left ventricular diastolic compliance   and/or increased left atrial pressure. Acoustic contrast   opacification revealed no evidence ofthrombus. - Ventricular septum: Septal motion showed abnormal function,   dyssynergy, and paradox. These changes are consistent with right   ventricular pacing. - Aortic valve: Noncoronary cusp mobility was severely restricted.   Transvalvular velocity was increased less than expected, due to   low cardiac output. There was mild stenosis. There was mild   regurgitation directed centrally in the LVOT. Valve area (VTI):   1.41 cm^2. - Mitral valve: Calcified annulus. There was mild to moderate   regurgitation directed centrally. - Left atrium: The atrium was moderately dilated. - Right ventricle: Systolic function was mildly reduced. - Tricuspid valve: There was moderate regurgitation. - Pulmonary arteries: Systolic pressure was moderately increased.   PA peak pressure: 63 mm Hg (S).       ASSESSMENT:    1. Acute on chronic systolic and diastolic heart failure, NYHA class 4 ([place])   2. Coronary artery disease involving coronary bypass graft without angina pectoris, unspecified whether native or transplanted heart   3. Essential hypertension   4. PAF (paroxysmal atrial fibrillation) ([place])   5. Pacemaker implanted 12/12/13 (MDT)      PLAN:  In order of problems listed above:  Acute on chronic systolic and diastolic CHF ejection fraction 25-30% on recent echo 07/2016.  Patient did not tolerate the increase in Lasix to 80 mg twice daily and never did get the Zaroxolyn.  His weight is down 3 pounds and he is feeling better.  Still has some edema.  Increase Lasix to 120 mg once daily for 3 days then back to 80 mg once daily.  Will  check kidney function today.  Follow-up with [name] as scheduled in January.  CAD status post CABG in 1995 and PCI of the SVG to the OM in 2015 with recent end STEMI in the setting of acute anemia from hematochezia medical therapy recommended.  No angina.  On Plavix  Essential hypertension well-controlled  PAF on amiodarone no anticoagulation secondary to falls and bleeding risk  Pacemaker followed by Dr. Rayann Heman seen 04/2016    Medication Adjustments/Labs and Tests Ordered: Current medicines are reviewed at length with the patient today.  Concerns regarding medicines are outlined above.  Medication changes, Labs and Tests ordered today are listed in the Patient Instructions below. Patient Instructions  Medication Instructions:  Your physician has recommended you make the following change in your medication:  1.  INCREASE the Lasix 80 mg to 1 1/2 tablet for 3 days then go back to 1 tablet daily   Labwork: TODAY:  BMET  Testing/Procedures: None ordered  Follow-Up: Your physician recommends that you schedule a follow-up appointment in: [place] AS PLANNED   Any Other Special Instructions Will Be Listed Below (If Applicable).     If you need a refill on your cardiac medications before your next appointment, please call your pharmacy.      Sumner Boast, PA-C  11/17/2016 10:35 AM    [facility] [address], [place], [place]  [address] Phone: [phone]; Fax: [phone]

## 2016-11-17 NOTE — Patient Instructions (Signed)
Medication Instructions:  Your physician has recommended you make the following change in your medication:  1.  INCREASE the Lasix 80 mg to 1 1/2 tablet for 3 days then go back to 1 tablet daily   Labwork: TODAY:  BMET  Testing/Procedures: None ordered  Follow-Up: Your physician recommends that you schedule a follow-up appointment in: [place] AS PLANNED   Any Other Special Instructions Will Be Listed Below (If Applicable).     If you need a refill on your cardiac medications before your next appointment, please call your pharmacy.

## 2016-11-23 ENCOUNTER — Telehealth: Payer: Self-pay | Admitting: Cardiovascular Disease

## 2016-11-23 NOTE — Telephone Encounter (Signed)
Claiborne Billings nurse doing PT/OT at pt's building , his BP 94/44 , Claiborne Billings wants to know if [name] has any perimeters for pt's levels, as to what to do if over or under certain numbers-pls call [phone]

## 2016-11-23 NOTE — Telephone Encounter (Signed)
Called Evan [name] to see if she had discuss this with the PCP. Evan [name] stated patient's PCP referred patient's perimeters be set by cardiologist. Will forward to [name] for perimeters.

## 2016-11-23 NOTE — Telephone Encounter (Signed)
Can continue current meds if asymptomatic

## 2016-11-24 NOTE — Telephone Encounter (Signed)
Left message to call back  

## 2016-11-30 NOTE — Telephone Encounter (Signed)
Left message for Kelly to call back. 

## 2016-11-30 NOTE — Telephone Encounter (Signed)
F/u message  Claiborne Billings from [employer] returning RN call .please call back to discuss

## 2016-11-30 NOTE — Telephone Encounter (Signed)
Evan [name] called back from [employer]. Informed her of Dr. Kyla Balzarine recommendations. Evan [name] verbalized understanding.

## 2016-12-06 DIAGNOSIS — I4891 Unspecified atrial fibrillation: Secondary | ICD-10-CM | POA: Insufficient documentation

## 2017-01-04 NOTE — Progress Notes (Deleted)
Cardiology Office Note    Date:  01/04/2017   ID:  Evan [name], DOB May 14, 1923, MRN [id number]  PCP:  Deland Pretty, MD  Cardiologist: [name]  No chief complaint on file.   History of Present Illness:  Evan [name] is a 83 y.o. male with history of CAD status post CABG x5 in 1996, DES to SVG to OM 1/2 in 2015, LV dysfunction ejection fraction 25-30% 07/2016, PAF and PAT on amiodarone, PAD, complete heart block status post pacemaker, diabetes mellitus type 2 and hypothyroidism.  Patient has chronic weakness in the legs with atrophy and frequent falls.  He is not felt to be a good candidate for anticoagulation.  He had an admission 06/2016 with hematochezia and chest pain.  Hemoglobin was 8 and NSTEMI troponin peaked at 8.21.  He was not started on heparin.  Aspirin and Plavix were discontinued given active bleeding.  Readmitted 07/2016 with CHF Echo showed worsened ejection fraction 25-30%.  Norvasc was stopped and Entresto started.  Saw me  10/20/16 and had worsening lower extremity edema.  Lasix increased transiently with improvement   ***   Past Medical History:  Diagnosis Date  . AC joint dislocation   . ALLERGIC RHINITIS   . Allergic rhinitis   . Anginal pain ([place])   . Arthritis   . Atrial tachycardia ([place])   . BPH (benign prostatic hyperplasia)   . CAD   . CHF (congestive heart failure) ([place])   . Diarrhea   . DM    type 2  . GERD (gastroesophageal reflux disease)   . H/O: GI bleed   . HYPERCHOLESTEROLEMIA   . HYPERTENSION   . Iron deficiency anemia   . NEPHROLITHIASIS, HX OF   . Paroxysmal atrial fibrillation (HCC)   . Presence of permanent cardiac pacemaker 12/12/2013  . Rib fracture   . Stable angina (HCC)   . Syncope   . Thyroid disease   . TRANSIENT ISCHEMIC ATTACKS, HX OF   . 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 Urinary frequency   . Vertigo   . Weakness of left leg     Past Surgical History:  Procedure  Laterality Date  . AV FISTULA REPAIR    . BACK SURGERY  2000'S  . CARDIAC CATHETERIZATION  12/11/2013   Procedure: CORONARY STENT INTERVENTION;  Surgeon: Burnell Blanks, MD;  Location: [facility] CATH LAB;  Service: Cardiovascular;;  DES 3.5 x 15 Resolute to seq SVG of OM1/OM2   . CORONARY ARTERY BYPASS GRAFT  1996  . CORONARY STENT PLACEMENT  12/11/13   SVG-OM DES  . FEMORAL ARTERY ANEURYSM REPAIR     feroral pseudo-aneurysm repair  . INSERT / REPLACE / REMOVE PACEMAKER    . LEFT HEART CATHETERIZATION WITH CORONARY/GRAFT ANGIOGRAM N/A 12/11/2013   Procedure: LEFT HEART CATHETERIZATION WITH Beatrix Fetters;  Surgeon: Burnell Blanks, MD;  Location: [facility] CATH LAB;  Service: Cardiovascular;  Laterality: N/A;  . PERMANENT PACEMAKER INSERTION N/A 12/12/2013   MDT ADDRL1 pacemaker implnated by Dr Rayann Heman  . PTCA  1980's    Current Medications: No outpatient medications have been marked as taking for the 01/12/17 encounter (Appointment) with Evan Hector, MD.     Allergies:   Clonidine derivatives; Codeine; Meperidine hcl; Motrin [ibuprofen]; Other; Penicillins; Demerol; Heparin; and Metformin and related   Social History   Socioeconomic History  . Marital status: Widowed    Spouse name: Not on file  .  Number of children: Not on file  . Years of education: Not on file  . Highest education level: Not on file  Social Needs  . Financial resource strain: Not on file  . Food insecurity - worry: Not on file  . Food insecurity - inability: Not on file  . Transportation needs - medical: Not on file  . Transportation needs - non-medical: Not on file  Occupational History  . Not on file  Tobacco Use  . Smoking status: Former Smoker    Last attempt to quit: 01/06/1956    Years since quitting: 61.0  . Smokeless tobacco: Never Used  Substance and Sexual Activity  . Alcohol use: No    Alcohol/week: 0.0 oz  . Drug use: No  . Sexual activity: Not Currently  Other Topics Concern    . Not on file  Social History Narrative   Pt is married and lives with his wife.  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       Family History:  The patient's family history includes Coronary artery disease in his father; Diabetes in his mother.   ROS:   Please see the history of present illness.    Review of Systems  Constitution: Negative.  HENT: Negative.   Cardiovascular: Positive for leg swelling.  Respiratory: Negative.   Endocrine: Negative.   Hematologic/Lymphatic: Negative.   Musculoskeletal: Negative.   Gastrointestinal: Negative.   Genitourinary: Negative.   Neurological: Negative.    All other systems reviewed and are negative.   PHYSICAL EXAM:   VS:  There were no vitals taken for this visit.  Physical Exam  GEN: Well nourished, well developed, in no acute distress  Neck: Slight increase JVD, no carotid bruits, or masses Cardiac:RRR; 2/6 systolic murmur at the left sternal border Respiratory:  clear to auscultation bilaterally, normal work of breathing GI: soft, nontender, nondistended, + BS Ext: +1-2 edema up to his knees without cyanosis, clubbing, Good distal pulses bilaterally Neuro:  Alert and Oriented x 3 Psych: euthymic mood, full affect  Wt Readings from Last 3 Encounters:  11/17/16 153 lb 6.4 oz (69.6 kg)  10/20/16 156 lb 6.4 oz (70.9 kg)  09/29/16 157 lb 6.4 oz (71.4 kg)      Studies/Labs Reviewed:   EKG:  EKG is not ordered today.   Recent Labs: 07/30/2016: ALT 13; B Natriuretic Peptide 1,276.9; Magnesium 2.1 08/07/2016: TSH 2.35 09/29/2016: Hemoglobin 9.4; Platelets 255 11/17/2016: BUN 16; Creatinine, Ser 1.36; Potassium 4.5; Sodium 142   Lipid Panel No results found for: CHOL, TRIG, HDL, CHOLHDL, VLDL, LDLCALC, LDLDIRECT  Additional studies/ records that were reviewed today include:  2D echo 7/2018Study Conclusions   - Left ventricle: The cavity size was normal. Systolic function was    severely reduced. The estimated ejection fraction was in the   range of 25% to 30%. Severe diffuse hypokinesis with no   identifiable regional variations. There is substantial systolic   dyssynchrony, both septal-to-lateral wall and apex-to-base.   Doppler parameters are consistent with restrictive physiology,   indicative of decreased left ventricular diastolic compliance   and/or increased left atrial pressure. Acoustic contrast   opacification revealed no evidence ofthrombus. - Ventricular septum: Septal motion showed abnormal function,   dyssynergy, and paradox. These changes are consistent with right   ventricular pacing. - Aortic valve: Noncoronary cusp mobility was severely restricted.   Transvalvular velocity was increased less than expected, due to   low  cardiac output. There was mild stenosis. There was mild   regurgitation directed centrally in the LVOT. Valve area (VTI):   1.41 cm^2. - Mitral valve: Calcified annulus. There was mild to moderate   regurgitation directed centrally. - Left atrium: The atrium was moderately dilated. - Right ventricle: Systolic function was mildly reduced. - Tricuspid valve: There was moderate regurgitation. - Pulmonary arteries: Systolic pressure was moderately increased.   PA peak pressure: 63 mm Hg (S).       ASSESSMENT:    No diagnosis found.   PLAN:  In order of problems listed above:  Acute on chronic systolic and diastolic CHF ejection fraction 25-30% on echo 07/2016.  Tolerates lasix 80 mg daily continue Current dose entresto and coreg   CAD status post CABG in 1995 and PCI of the SVG to the OM in 2015 June 2018 SEMI  in the setting of acute anemia from hematochezia medical therapy recommended.  No angina.  On Plavix No cath done due to age and co morbidities   Essential hypertension Well controlled.  Continue current medications and low sodium Dash type diet.    PAF on amiodarone no anticoagulation secondary to falls and  bleeding risk  Pacemaker followed by Dr. Rayann Heman seen 04/2016    Medication Adjustments/Labs and Tests Ordered: Current medicines are reviewed at length with the patient today.  Concerns regarding medicines are outlined above.  Medication changes, Labs and Tests ordered today are listed in the Patient Instructions below. There are no Patient Instructions on file for this visit.   Signed, Jenkins Rouge, MD  01/04/2017 1:50 PM    [facility] [place], [place], [place]  [address] Phone: [phone]; Fax: [phone]

## 2017-01-11 ENCOUNTER — Emergency Department (HOSPITAL_COMMUNITY): Payer: Medicare Other

## 2017-01-11 ENCOUNTER — Telehealth: Payer: Self-pay | Admitting: Cardiovascular Disease

## 2017-01-11 ENCOUNTER — Encounter (HOSPITAL_COMMUNITY): Payer: Self-pay

## 2017-01-11 ENCOUNTER — Other Ambulatory Visit: Payer: Self-pay

## 2017-01-11 ENCOUNTER — Inpatient Hospital Stay (HOSPITAL_COMMUNITY)
Admission: EM | Admit: 2017-01-11 | Discharge: 2017-01-14 | DRG: 281 | Disposition: A | Discharge status: Home Health | Payer: Medicare Other | Attending: Family Medicine | Admitting: Family Medicine

## 2017-01-11 DIAGNOSIS — I2510 Atherosclerotic heart disease of native coronary artery without angina pectoris: Secondary | ICD-10-CM | POA: Diagnosis present

## 2017-01-11 DIAGNOSIS — Z8249 Family history of ischemic heart disease and other diseases of the circulatory system: Secondary | ICD-10-CM

## 2017-01-11 DIAGNOSIS — I248 Other forms of acute ischemic heart disease: Secondary | ICD-10-CM | POA: Diagnosis not present

## 2017-01-11 DIAGNOSIS — D649 Anemia, unspecified: Secondary | ICD-10-CM | POA: Diagnosis not present

## 2017-01-11 DIAGNOSIS — K219 Gastro-esophageal reflux disease without esophagitis: Secondary | ICD-10-CM | POA: Diagnosis present

## 2017-01-11 DIAGNOSIS — Z950 Presence of cardiac pacemaker: Secondary | ICD-10-CM | POA: Diagnosis not present

## 2017-01-11 DIAGNOSIS — D509 Iron deficiency anemia, unspecified: Secondary | ICD-10-CM | POA: Diagnosis present

## 2017-01-11 DIAGNOSIS — Z951 Presence of aortocoronary bypass graft: Secondary | ICD-10-CM

## 2017-01-11 DIAGNOSIS — I442 Atrioventricular block, complete: Secondary | ICD-10-CM | POA: Diagnosis present

## 2017-01-11 DIAGNOSIS — R0989 Other specified symptoms and signs involving the circulatory and respiratory systems: Secondary | ICD-10-CM | POA: Diagnosis present

## 2017-01-11 DIAGNOSIS — N400 Enlarged prostate without lower urinary tract symptoms: Secondary | ICD-10-CM | POA: Diagnosis present

## 2017-01-11 DIAGNOSIS — E785 Hyperlipidemia, unspecified: Secondary | ICD-10-CM | POA: Diagnosis present

## 2017-01-11 DIAGNOSIS — R5383 Other fatigue: Secondary | ICD-10-CM

## 2017-01-11 DIAGNOSIS — Z91018 Allergy to other foods: Secondary | ICD-10-CM

## 2017-01-11 DIAGNOSIS — Z833 Family history of diabetes mellitus: Secondary | ICD-10-CM

## 2017-01-11 DIAGNOSIS — I10 Essential (primary) hypertension: Secondary | ICD-10-CM | POA: Diagnosis not present

## 2017-01-11 DIAGNOSIS — N183 Chronic kidney disease, stage 3 unspecified: Secondary | ICD-10-CM

## 2017-01-11 DIAGNOSIS — I25810 Atherosclerosis of coronary artery bypass graft(s) without angina pectoris: Secondary | ICD-10-CM

## 2017-01-11 DIAGNOSIS — Z79890 Hormone replacement therapy (postmenopausal): Secondary | ICD-10-CM

## 2017-01-11 DIAGNOSIS — Z880 Allergy status to penicillin: Secondary | ICD-10-CM

## 2017-01-11 DIAGNOSIS — Z7902 Long term (current) use of antithrombotics/antiplatelets: Secondary | ICD-10-CM

## 2017-01-11 DIAGNOSIS — I130 Hypertensive heart and chronic kidney disease with heart failure and stage 1 through stage 4 chronic kidney disease, or unspecified chronic kidney disease: Secondary | ICD-10-CM | POA: Diagnosis present

## 2017-01-11 DIAGNOSIS — Z79899 Other long term (current) drug therapy: Secondary | ICD-10-CM

## 2017-01-11 DIAGNOSIS — R072 Precordial pain: Secondary | ICD-10-CM

## 2017-01-11 DIAGNOSIS — I495 Sick sinus syndrome: Secondary | ICD-10-CM | POA: Diagnosis present

## 2017-01-11 DIAGNOSIS — I214 Non-ST elevation (NSTEMI) myocardial infarction: Secondary | ICD-10-CM | POA: Diagnosis not present

## 2017-01-11 DIAGNOSIS — I509 Heart failure, unspecified: Secondary | ICD-10-CM | POA: Diagnosis not present

## 2017-01-11 DIAGNOSIS — R778 Other specified abnormalities of plasma proteins: Secondary | ICD-10-CM

## 2017-01-11 DIAGNOSIS — I480 Paroxysmal atrial fibrillation: Secondary | ICD-10-CM | POA: Diagnosis present

## 2017-01-11 DIAGNOSIS — E1121 Type 2 diabetes mellitus with diabetic nephropathy: Secondary | ICD-10-CM | POA: Diagnosis present

## 2017-01-11 DIAGNOSIS — R51 Headache: Secondary | ICD-10-CM | POA: Diagnosis present

## 2017-01-11 DIAGNOSIS — R55 Syncope and collapse: Secondary | ICD-10-CM | POA: Diagnosis present

## 2017-01-11 DIAGNOSIS — E1122 Type 2 diabetes mellitus with diabetic chronic kidney disease: Secondary | ICD-10-CM | POA: Diagnosis present

## 2017-01-11 DIAGNOSIS — E7800 Pure hypercholesterolemia, unspecified: Secondary | ICD-10-CM | POA: Diagnosis not present

## 2017-01-11 DIAGNOSIS — Z955 Presence of coronary angioplasty implant and graft: Secondary | ICD-10-CM | POA: Diagnosis not present

## 2017-01-11 DIAGNOSIS — Z8673 Personal history of transient ischemic attack (TIA), and cerebral infarction without residual deficits: Secondary | ICD-10-CM

## 2017-01-11 DIAGNOSIS — E039 Hypothyroidism, unspecified: Secondary | ICD-10-CM | POA: Diagnosis present

## 2017-01-11 DIAGNOSIS — Z9861 Coronary angioplasty status: Secondary | ICD-10-CM

## 2017-01-11 DIAGNOSIS — Z9181 History of falling: Secondary | ICD-10-CM | POA: Diagnosis not present

## 2017-01-11 DIAGNOSIS — D6959 Other secondary thrombocytopenia: Secondary | ICD-10-CM | POA: Diagnosis present

## 2017-01-11 DIAGNOSIS — R0789 Other chest pain: Secondary | ICD-10-CM | POA: Diagnosis not present

## 2017-01-11 DIAGNOSIS — K921 Melena: Secondary | ICD-10-CM | POA: Diagnosis present

## 2017-01-11 DIAGNOSIS — R7989 Other specified abnormal findings of blood chemistry: Secondary | ICD-10-CM | POA: Diagnosis not present

## 2017-01-11 DIAGNOSIS — D62 Acute posthemorrhagic anemia: Secondary | ICD-10-CM | POA: Diagnosis present

## 2017-01-11 DIAGNOSIS — E079 Disorder of thyroid, unspecified: Secondary | ICD-10-CM | POA: Insufficient documentation

## 2017-01-11 DIAGNOSIS — Z87891 Personal history of nicotine dependence: Secondary | ICD-10-CM

## 2017-01-11 DIAGNOSIS — R748 Abnormal levels of other serum enzymes: Secondary | ICD-10-CM | POA: Diagnosis not present

## 2017-01-11 DIAGNOSIS — D696 Thrombocytopenia, unspecified: Secondary | ICD-10-CM | POA: Diagnosis not present

## 2017-01-11 DIAGNOSIS — I21A1 Myocardial infarction type 2: Principal | ICD-10-CM | POA: Diagnosis present

## 2017-01-11 DIAGNOSIS — I5042 Chronic combined systolic (congestive) and diastolic (congestive) heart failure: Secondary | ICD-10-CM | POA: Diagnosis present

## 2017-01-11 DIAGNOSIS — R079 Chest pain, unspecified: Secondary | ICD-10-CM | POA: Diagnosis present

## 2017-01-11 DIAGNOSIS — Z87442 Personal history of urinary calculi: Secondary | ICD-10-CM

## 2017-01-11 DIAGNOSIS — Z885 Allergy status to narcotic agent status: Secondary | ICD-10-CM

## 2017-01-11 DIAGNOSIS — Z888 Allergy status to other drugs, medicaments and biological substances status: Secondary | ICD-10-CM

## 2017-01-11 HISTORY — DX: Hypothyroidism, unspecified: E03.9

## 2017-01-11 LAB — URINALYSIS, ROUTINE W REFLEX MICROSCOPIC
BILIRUBIN URINE: NEGATIVE
GLUCOSE, UA: 50 mg/dL — AB
HGB URINE DIPSTICK: NEGATIVE
Ketones, ur: 5 mg/dL — AB
NITRITE: NEGATIVE
Protein, ur: NEGATIVE mg/dL
Specific Gravity, Urine: 1.016 (ref 1.005–1.030)
Squamous Epithelial / LPF: NONE SEEN
pH: 5 (ref 5.0–8.0)

## 2017-01-11 LAB — FOLATE: FOLATE: 15.9 ng/mL (ref >5.9)

## 2017-01-11 LAB — BASIC METABOLIC PANEL
Anion gap: 10 (ref 5–15)
BUN: 32 mg/dL — AB (ref 6–20)
CO2: 22 mmol/L (ref 22–32)
CREATININE: 1.58 mg/dL — AB (ref 0.61–1.24)
Calcium: 8.7 mg/dL — ABNORMAL LOW (ref 8.9–10.3)
Chloride: 107 mmol/L (ref 101–111)
GFR calc Af Amer: 42 mL/min — ABNORMAL LOW (ref >60)
GFR calc non Af Amer: 37 mL/min — ABNORMAL LOW (ref >60)
GLUCOSE: 195 mg/dL — AB (ref 65–99)
POTASSIUM: 3.9 mmol/L (ref 3.5–5.1)
SODIUM: 139 mmol/L (ref 135–145)

## 2017-01-11 LAB — GLUCOSE, CAPILLARY: Glucose-Capillary: 119 mg/dL — ABNORMAL HIGH (ref 65–99)

## 2017-01-11 LAB — IRON AND TIBC
Iron: 23 ug/dL — ABNORMAL LOW (ref 45–182)
SATURATION RATIOS: 7 % — AB (ref 17.9–39.5)
TIBC: 337 ug/dL (ref 250–450)
UIBC: 314 ug/dL

## 2017-01-11 LAB — I-STAT TROPONIN, ED: Troponin i, poc: 0.06 ng/mL (ref 0.00–0.08)

## 2017-01-11 LAB — CBC
HEMATOCRIT: 24.5 % — AB (ref 39.0–52.0)
Hemoglobin: 7.4 g/dL — ABNORMAL LOW (ref 13.0–17.0)
MCH: 26.2 pg (ref 26.0–34.0)
MCHC: 30.2 g/dL (ref 30.0–36.0)
MCV: 86.9 fL (ref 78.0–100.0)
PLATELETS: 165 10*3/uL (ref 150–400)
RBC: 2.82 MIL/uL — ABNORMAL LOW (ref 4.22–5.81)
RDW: 17.3 % — AB (ref 11.5–15.5)
WBC: 6.4 10*3/uL (ref 4.0–10.5)

## 2017-01-11 LAB — CBG MONITORING, ED: GLUCOSE-CAPILLARY: 113 mg/dL — AB (ref 65–99)

## 2017-01-11 LAB — POC OCCULT BLOOD, ED: Fecal Occult Bld: POSITIVE — AB

## 2017-01-11 LAB — PREPARE RBC (CROSSMATCH)

## 2017-01-11 LAB — FERRITIN: Ferritin: 16 ng/mL — ABNORMAL LOW (ref 24–336)

## 2017-01-11 LAB — BRAIN NATRIURETIC PEPTIDE: B Natriuretic Peptide: 1870.9 pg/mL — ABNORMAL HIGH (ref 0.0–100.0)

## 2017-01-11 LAB — RETICULOCYTES
RBC.: 2.65 MIL/uL — ABNORMAL LOW (ref 4.22–5.81)
Retic Count, Absolute: 58.3 10*3/uL (ref 19.0–186.0)
Retic Ct Pct: 2.2 % (ref 0.4–3.1)

## 2017-01-11 LAB — HEMOGLOBIN A1C
Hgb A1c MFr Bld: 6.9 % — ABNORMAL HIGH (ref 4.8–5.6)
Mean Plasma Glucose: 151.33 mg/dL

## 2017-01-11 LAB — TROPONIN I
TROPONIN I: 1.02 ng/mL — AB (ref <0.03)
Troponin I: 0.51 ng/mL (ref <0.03)

## 2017-01-11 LAB — TSH: TSH: 1.61 u[IU]/mL (ref 0.350–4.500)

## 2017-01-11 LAB — VITAMIN B12: Vitamin B-12: 372 pg/mL (ref 180–914)

## 2017-01-11 MED ORDER — MELATONIN 3 MG PO TABS
3.0000 mg | ORAL_TABLET | Freq: Every evening | ORAL | Status: DC | PRN
  Administered 2017-01-13: 3 mg via ORAL
  Filled 2017-01-11 (×2): qty 1

## 2017-01-11 MED ORDER — SENNOSIDES-DOCUSATE SODIUM 8.6-50 MG PO TABS: 1.0000 | ORAL_TABLET | Freq: Every evening | ORAL | Status: DC | PRN

## 2017-01-11 MED ORDER — ATORVASTATIN CALCIUM 10 MG PO TABS
10.0000 mg | ORAL_TABLET | Freq: Every day | ORAL | Status: DC
  Administered 2017-01-12 – 2017-01-13 (×2): 10 mg via ORAL
  Filled 2017-01-11 (×2): qty 1

## 2017-01-11 MED ORDER — ONDANSETRON HCL 4 MG/2ML IJ SOLN
4.0000 mg | Freq: Four times a day (QID) | INTRAMUSCULAR | Status: DC | PRN
Start: 2017-01-11 — End: 2017-01-14

## 2017-01-11 MED ORDER — HYDROCODONE-ACETAMINOPHEN 5-325 MG PO TABS: 1.0000 | ORAL_TABLET | ORAL | Status: DC | PRN

## 2017-01-11 MED ORDER — MORPHINE SULFATE (PF) 4 MG/ML IV SOLN: 1.0000 mg | INTRAVENOUS | Status: DC | PRN

## 2017-01-11 MED ORDER — ACETAMINOPHEN 650 MG RE SUPP: 650.0000 mg | Freq: Four times a day (QID) | RECTAL | Status: DC | PRN

## 2017-01-11 MED ORDER — FINASTERIDE 5 MG PO TABS
5.0000 mg | ORAL_TABLET | Freq: Every day | ORAL | Status: DC
  Administered 2017-01-12 – 2017-01-14 (×3): 5 mg via ORAL
  Filled 2017-01-11 (×3): qty 1

## 2017-01-11 MED ORDER — AMIODARONE HCL 200 MG PO TABS
100.0000 mg | ORAL_TABLET | Freq: Every day | ORAL | Status: DC
  Administered 2017-01-12 – 2017-01-14 (×3): 100 mg via ORAL
  Filled 2017-01-11 (×3): qty 1

## 2017-01-11 MED ORDER — FOLIC ACID 0.5 MG HALF TAB
500.0000 ug | ORAL_TABLET | ORAL | Status: DC
  Filled 2017-01-11 (×2): qty 1

## 2017-01-11 MED ORDER — BENZONATATE 100 MG PO CAPS: 200.0000 mg | ORAL_CAPSULE | Freq: Three times a day (TID) | ORAL | Status: DC | PRN

## 2017-01-11 MED ORDER — SACUBITRIL-VALSARTAN 24-26 MG PO TABS
1.0000 | ORAL_TABLET | Freq: Two times a day (BID) | ORAL | Status: DC
  Administered 2017-01-12 – 2017-01-14 (×5): 1 via ORAL
  Filled 2017-01-11 (×6): qty 1

## 2017-01-11 MED ORDER — FUROSEMIDE 10 MG/ML IJ SOLN: 20.0000 mg | Freq: Once | INTRAMUSCULAR | Status: DC

## 2017-01-11 MED ORDER — CARVEDILOL 12.5 MG PO TABS
12.5000 mg | ORAL_TABLET | Freq: Two times a day (BID) | ORAL | Status: DC
  Administered 2017-01-12 – 2017-01-14 (×5): 12.5 mg via ORAL
  Filled 2017-01-11 (×5): qty 1

## 2017-01-11 MED ORDER — FERROUS SULFATE 325 (65 FE) MG PO TABS
325.0000 mg | ORAL_TABLET | Freq: Two times a day (BID) | ORAL | Status: DC
  Administered 2017-01-12 – 2017-01-14 (×5): 325 mg via ORAL
  Filled 2017-01-11 (×5): qty 1

## 2017-01-11 MED ORDER — BACID PO TABS
1.0000 | ORAL_TABLET | Freq: Two times a day (BID) | ORAL | Status: DC
  Administered 2017-01-12 – 2017-01-14 (×5): 1 via ORAL
  Filled 2017-01-11 (×6): qty 1

## 2017-01-11 MED ORDER — INSULIN ASPART 100 UNIT/ML ~~LOC~~ SOLN
0.0000 [IU] | Freq: Three times a day (TID) | SUBCUTANEOUS | Status: DC
  Administered 2017-01-13: 1 [IU] via SUBCUTANEOUS
  Administered 2017-01-13: 2 [IU] via SUBCUTANEOUS

## 2017-01-11 MED ORDER — GI COCKTAIL ~~LOC~~: 30.0000 mL | Freq: Four times a day (QID) | ORAL | Status: DC | PRN

## 2017-01-11 MED ORDER — SODIUM CHLORIDE 0.9 % IV SOLN
10.0000 mL/h | Freq: Once | INTRAVENOUS | Status: AC
  Administered 2017-01-11: 10 mL/h via INTRAVENOUS

## 2017-01-11 MED ORDER — BISACODYL 10 MG RE SUPP: 10.0000 mg | Freq: Every day | RECTAL | Status: DC | PRN

## 2017-01-11 MED ORDER — ACETAMINOPHEN 325 MG PO TABS: 650.0000 mg | ORAL_TABLET | Freq: Four times a day (QID) | ORAL | Status: DC | PRN

## 2017-01-11 MED ORDER — FUROSEMIDE 20 MG PO TABS
80.0000 mg | ORAL_TABLET | Freq: Every day | ORAL | Status: DC
  Administered 2017-01-12 – 2017-01-14 (×3): 80 mg via ORAL
  Filled 2017-01-11 (×3): qty 4

## 2017-01-11 MED ORDER — DIVALPROEX SODIUM ER 250 MG PO TB24
250.0000 mg | ORAL_TABLET | Freq: Every day | ORAL | Status: DC
  Administered 2017-01-12 – 2017-01-13 (×2): 250 mg via ORAL
  Filled 2017-01-11 (×3): qty 1

## 2017-01-11 MED ORDER — ALPRAZOLAM 0.5 MG PO TABS: 0.2500 mg | ORAL_TABLET | Freq: Two times a day (BID) | ORAL | Status: DC | PRN

## 2017-01-11 MED ORDER — ISOSORBIDE MONONITRATE ER 30 MG PO TB24
30.0000 mg | ORAL_TABLET | Freq: Every day | ORAL | Status: DC
  Administered 2017-01-12 – 2017-01-14 (×3): 30 mg via ORAL
  Filled 2017-01-11 (×3): qty 1

## 2017-01-11 MED ORDER — LEVOTHYROXINE SODIUM 112 MCG PO TABS
112.0000 ug | ORAL_TABLET | Freq: Every day | ORAL | Status: DC
  Administered 2017-01-12 – 2017-01-14 (×3): 112 ug via ORAL
  Filled 2017-01-11 (×3): qty 1

## 2017-01-11 NOTE — ED Notes (Signed)
Attempted report x1. 

## 2017-01-11 NOTE — H&P (Signed)
History and Physical    Evan [name] ZOX:[id number] DOB: 01-03-1924 DOA: 01/11/2017   PCP: Deland Pretty, MD   Patient coming from:  Home    Chief Complaint: Chest pain , dark stools  HPI: Evan [name] is a 84 y.o. male with medical history significant for CHF, CAD status post CABG in 1996, diabetes, hypertension, hyperlipidemia, history of PAF, GERD  presenting to the emergency department for evaluation of worsening chest pain over the last 4 days only with exertion, having to stop at rest, due to these symptoms.  He took nitroglycerin, and the pain was relieved.  He has been trying over the last 4 days, to manage these symptoms, but upon presentation, these were worse.  At the ED, the symptoms are resolved.  Of note, his primary cardiologist is [name], and was scheduled to see him tomorrow, but did not want to wait to get seen, as the disease pain was coming more frequently.  In addition, the patient reports feeling more tired, and becoming more cold intolerant.  He does have a history of GI bleeding, and several days ago, he was noted very dark red almost black stools, but upon presentation "this is not the case ".  The patient denied overt bleeding "like 4 months ago".  But does not wish to proceed with any invasive procedures such as colonoscopies.  "Made a back with myself, but rather die than getting another one "review, June, it was felt that the GI bleed was related to anticoagulation.  He denies any gum bleeds, epistaxis, hematemesis, or hematuria.  He denies any fever, or chills.  He denies any sick contacts.  He has chronic lower extremity swelling, controlled with Lasix.  The patient is compliant with his medications and his visit to the doctors.  He does not smoke, drinks significant amount of alcohol, or partake recreational drugs.   ED Course:  BP (!) 130/41   Pulse (!) 59   Temp 98 F (36.7 C) (Oral)   Resp (!) 21   Ht 5\' 7"  (1.702 m)   Wt 68.9 kg (152 lb)   SpO2  100%   BMI 23.81 kg/m   Glucose 195, creatinine 1.58, GFR 37, hemoglobin 7.4, Hemoccult positive.  He is to receive 2 units of blood, as it has dropped over the last 2 months 2 g EKG Paced rhythm.  LVH with secondary repolarization abnormality  Chest x-ray NAD His last 2D echo was in July 2018, with EF 25-30%. GI and cardiology to see the patient.  Review of Systems:  As per HPI otherwise all other systems reviewed and are negative  Past Medical History:  Diagnosis Date  . AC joint dislocation   . ALLERGIC RHINITIS   . Allergic rhinitis   . Anginal pain ([place])   . Arthritis   . Atrial tachycardia ([place])   . BPH (benign prostatic hyperplasia)   . CAD   . CHF (congestive heart failure) ([place])   . Diarrhea   . DM    type 2  . GERD (gastroesophageal reflux disease)   . H/O: GI bleed   . HYPERCHOLESTEROLEMIA   . HYPERTENSION   . Iron deficiency anemia   . NEPHROLITHIASIS, HX OF   . Paroxysmal atrial fibrillation (HCC)   . Presence of permanent cardiac pacemaker 12/12/2013  . Rib fracture   . Stable angina (HCC)   . Syncope   . Thyroid disease   . TRANSIENT ISCHEMIC ATTACKS, HX OF   . 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 Urinary frequency   . Vertigo   . Weakness of left leg     Past Surgical History:  Procedure Laterality Date  . AV FISTULA REPAIR    . BACK SURGERY  2000'S  . CARDIAC CATHETERIZATION  12/11/2013   Procedure: CORONARY STENT INTERVENTION;  Surgeon: Burnell Blanks, MD;  Location: [facility] CATH LAB;  Service: Cardiovascular;;  DES 3.5 x 15 Resolute to seq SVG of OM1/OM2   . CORONARY ARTERY BYPASS GRAFT  1996  . CORONARY STENT PLACEMENT  12/11/13   SVG-OM DES  . FEMORAL ARTERY ANEURYSM REPAIR     feroral pseudo-aneurysm repair  . INSERT / REPLACE / REMOVE PACEMAKER    . LEFT HEART CATHETERIZATION WITH CORONARY/GRAFT ANGIOGRAM N/A 12/11/2013   Procedure: LEFT HEART CATHETERIZATION WITH Beatrix Fetters;  Surgeon:  Burnell Blanks, MD;  Location: [facility] CATH LAB;  Service: Cardiovascular;  Laterality: N/A;  . PERMANENT PACEMAKER INSERTION N/A 12/12/2013   MDT ADDRL1 pacemaker implnated by Dr Rayann Heman  . PTCA  1980's    Social History Social History   Socioeconomic History  . Marital status: Widowed    Spouse name: Not on file  . Number of children: Not on file  . Years of education: Not on file  . Highest education level: Not on file  Social Needs  . Financial resource strain: Not on file  . Food insecurity - worry: Not on file  . Food insecurity - inability: Not on file  . Transportation needs - medical: Not on file  . Transportation needs - non-medical: Not on file  Occupational History  . Not on file  Tobacco Use  . Smoking status: Former Smoker    Last attempt to quit: 01/06/1956    Years since quitting: 61.0  . Smokeless tobacco: Never Used  Substance and Sexual Activity  . Alcohol use: No    Alcohol/week: 0.0 oz  . Drug use: No  . Sexual activity: Not Currently  Other Topics Concern  . Not on file  Social History Narrative   Pt is married and lives with his wife.  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       Allergies  Allergen Reactions  . Clonidine Derivatives     unknown reaction   . Codeine     unknown reaction   . Meperidine Hcl     REACTION: Hypotension  . Motrin [Ibuprofen]     unknown reaction   . Other     Celery - unknown reaction  . Penicillins     unknown reaction   . Demerol Other (See Comments)    hypotension  . Heparin Other (See Comments)    hypotension  . Metformin And Related Diarrhea    Family History  Problem Relation Age of Onset  . Diabetes Mother   . Coronary artery disease Father       Prior to Admission medications   Medication Sig Start Date End Date Taking? Authorizing Provider  acetaminophen (TYLENOL) 325 MG tablet Take 650 mg by mouth every 6 (six) hours as needed for mild  pain.    [provider]  amiodarone (PACERONE) 100 MG tablet TAKE 1 TABLET BY MOUTH EVERY DAY 08/26/16   [name], MD  atorvastatin (LIPITOR) 10 MG tablet Take 1 tablet (10 mg total) by mouth daily. 08/24/16   Medina-Vargas, Monina C, NP  benzonatate (TESSALON) 200  MG capsule Take 1 capsule (200 mg total) by mouth 3 (three) times daily as needed for cough. 08/24/16   Medina-Vargas, Monina C, NP  carvedilol (COREG) 12.5 MG tablet Take 1 tablet (12.5 mg total) by mouth 2 (two) times daily with a meal. 08/24/16   Medina-Vargas, Monina C, NP  clopidogrel (PLAVIX) 75 MG tablet TAKE 1 TABLET BY MOUTH EVERY DAY WITH BREAKFAST 08/26/16   [name], MD  Cobalamine Combinations (VITAMIN B12-FOLIC ACID PO) Take 1 tablet by mouth every Monday, Wednesday, and Friday.    [provider]  divalproex (DEPAKOTE ER) 250 MG 24 hr tablet Take 1 tablet (250 mg total) by mouth at bedtime. 08/24/16   Medina-Vargas, Monina C, NP  ENTRESTO 24-26 MG TAKE 1 TABLET BY MOUTH 2 TIMES DAILY 08/26/16   [name], MD  ferrous sulfate 324 (65 Fe) MG TBEC Take 1 tablet by mouth 2 (two) times daily.    [provider]  finasteride (PROSCAR) 5 MG tablet Take 1 tablet (5 mg total) by mouth daily. 08/24/16   Medina-Vargas, Monina C, NP  folic acid (FOLVITE) 409 MCG tablet Take 400 mcg by mouth 3 (three) times a week. M, Wed,Fri    [provider]  furosemide (LASIX) 80 MG tablet Take 1 1/2 tablet by mouth daily for 3 days then go back to 1 tablet by mouth daily 11/17/16   Imogene Burn, PA-C  isosorbide mononitrate (IMDUR) 30 MG 24 hr tablet Take 1 tablet (30 mg total) by mouth daily. 08/24/16   Medina-Vargas, Monina C, NP  levothyroxine (SYNTHROID, LEVOTHROID) 112 MCG tablet Take 1 tablet (112 mcg total) by mouth daily before breakfast. 08/24/16   Medina-Vargas, Monina C, NP  MELATONIN PO Take 10 mg by mouth at bedtime as needed.    [provider]  nitroGLYCERIN (NITROSTAT) 0.4  MG SL tablet PLACE 1 TABLET (0.4 MG TOTAL) UNDER THE TONGUE EVERY 5 (FIVE) MINUTES AS NEEDED FOR CHEST PAIN. 08/24/16   Medina-Vargas, Monina C, NP  saccharomyces boulardii (FLORASTOR) 250 MG capsule Take 1 capsule (250 mg total) by mouth 2 (two) times daily. 08/24/16   Medina-Vargas, Monina C, NP  sitaGLIPtin (JANUVIA) 25 MG tablet Take 1 tablet (25 mg total) by mouth daily. Take one tablet by mouth once daily to control blood sugar 08/24/16   Medina-Vargas, Monina C, NP    Physical Exam:  Vitals:   01/11/17 1135 01/11/17 1205  BP: (!) 119/41 (!) 130/41  Pulse: 60 (!) 59  Resp: 20 (!) 21  Temp: 98 F (36.7 C) 98 F (36.7 C)  TempSrc: Oral Oral  SpO2: 100% 100%  Weight: 68.9 kg (152 lb)   Height: 5\' 7"  (1.702 m)    Constitutional: NAD, calm, comfortable, pale  Eyes: PERRL, lids and conjunctivae normal ENMT: Mucous membranes are moist, without exudate or lesions . Several missing teeth Neck: normal, supple, no masses, no thyromegaly Respiratory: clear to auscultation bilaterally, no wheezing, no crackles. Normal respiratory effort  Cardiovascular: Regular rate and rhythm,  2-3 murmur, rubs or gallops 1 + chronic lower  extremity edema. 2+ pedal pulses. No carotid bruits.  Abdomen: Soft, non tender, No hepatosplenomegaly. Bowel sounds positive.  Musculoskeletal: no clubbing / cyanosis. Moves all extremities Skin: no jaundice, No lesions. Well healed scar on hisforehead  Neurologic: Sensation intact  Strength equal in all extremities Psychiatric:   Alert and oriented x 3. Normal mood.     Labs on Admission: I have personally reviewed following labs and  imaging studies  CBC: Recent Labs  Lab 01/11/17 1202  WBC 6.4  HGB 7.4*  HCT 24.5*  MCV 86.9  PLT 194    Basic Metabolic Panel: Recent Labs  Lab 01/11/17 1202  NA 139  K 3.9  CL 107  CO2 22  GLUCOSE 195*  BUN 32*  CREATININE 1.58*  CALCIUM 8.7*    GFR: Estimated Creatinine Clearance: 28.5 mL/min (A) (by C-G  formula based on SCr of 1.58 mg/dL (H)).  Liver Function Tests: No results for input(s): AST, ALT, ALKPHOS, BILITOT, PROT, ALBUMIN in the last 168 hours. No results for input(s): LIPASE, AMYLASE in the last 168 hours. No results for input(s): AMMONIA in the last 168 hours.  Coagulation Profile: No results for input(s): INR, PROTIME in the last 168 hours.  Cardiac Enzymes: No results for input(s): CKTOTAL, CKMB, CKMBINDEX, TROPONINI in the last 168 hours.  BNP (last 3 results) No results for input(s): PROBNP in the last 8760 hours.  HbA1C: No results for input(s): HGBA1C in the last 72 hours.  CBG: No results for input(s): GLUCAP in the last 168 hours.  Lipid Profile: No results for input(s): CHOL, HDL, LDLCALC, TRIG, CHOLHDL, LDLDIRECT in the last 72 hours.  Thyroid Function Tests: No results for input(s): TSH, T4TOTAL, FREET4, T3FREE, THYROIDAB in the last 72 hours.  Anemia Panel: No results for input(s): VITAMINB12, FOLATE, FERRITIN, TIBC, IRON, RETICCTPCT in the last 72 hours.  Urine analysis:    Component Value Date/Time   COLORURINE YELLOW 07/30/2016 1017   APPEARANCEUR CLEAR 07/30/2016 1017   LABSPEC 1.019 07/30/2016 1017   PHURINE 5.0 07/30/2016 1017   GLUCOSEU NEGATIVE 07/30/2016 1017   HGBUR NEGATIVE 07/30/2016 1017   BILIRUBINUR NEGATIVE 07/30/2016 1017   KETONESUR NEGATIVE 07/30/2016 1017   PROTEINUR 30 (A) 07/30/2016 1017   UROBILINOGEN 0.2 12/07/2013 1737   NITRITE NEGATIVE 07/30/2016 1017   LEUKOCYTESUR MODERATE (A) 07/30/2016 1017    Sepsis Labs: @LABRCNTIP (procalcitonin:4,lacticidven:4) )No results found for this or any previous visit (from the past 240 hour(s)).   Radiological Exams on Admission: Dg Chest 2 View  Result Date: 01/11/2017 CLINICAL DATA:  Chest pain. EXAM: CHEST  2 VIEW COMPARISON:  Chest x-ray dated August 02, 2016. FINDINGS: Unchanged left chest wall pacer device with leads terminating in the right atrium and right ventricle.  Postsurgical changes related to prior CABG. Normal heart size. Atherosclerotic calcification of the aortic arch. Normal pulmonary vascularity. No focal consolidation, pleural effusion, or pneumothorax. No acute osseous abnormality. IMPRESSION: No active cardiopulmonary disease. Electronically Signed   By: Titus Dubin M.D.   On: 01/11/2017 12:47    EKG: Independently reviewed.  Assessment/Plan Active Problems:   Type 2 diabetes with nephropathy (HCC)   HYPERCHOLESTEROLEMIA   Iron deficiency anemia   Essential hypertension   CABG '96   GERD (gastroesophageal reflux disease)   Carotid bruit   Sick sinus syndrome (HCC)   PAF (paroxysmal atrial fibrillation) (HCC)   Syncope   CAD S/P SVG-MO1/OM2 DES 12/11/13   Pacemaker implanted 12/12/13 (MDT)   Stage 3 chronic renal impairment associated with type 2 diabetes mellitus (HCC)   NSTEMI (non-ST elevated myocardial infarction) ([place])   Atypical chest pain   BPH (benign prostatic hyperplasia)   Thyroid disease    Chest pain syndrome in a patient with known history of CAD, status post CABG x5, last catheterization in 2015, treated medically as well as a history of GI bleed.  To determine if patient's chest pain is of cardiac nature, versus  symptomatic anemia. EKG Paced rhythm.  LVH with secondary repolarization abnormality . Troponin 0 0.06. Chest x-ray NAD. His last 2D echo was in July 2018, with EF 25-30%.hemoglobin 7.4, Hemoccult positive. He is to receive 2 units of blood, as it has dropped over the last 2 months 2 g . Patient has a history of Iron deficiency anemia  GI and cardiology to see the patient.  HEART score 4-5   Admit to Telemetry/ Observation Chest pain order set Cycle troponins EKG in am continue  NTG as needed Continue preadmission beta blocker and nitrate Statins  GI cocktail Check Lipid panel   Hold Plavix due to GIB .  Agree with tranfusion 2 U of blood, will order Lasix 20 mg IV x1 in between transfusions to prevent  fluid overload  Anemia panel prior to transfusion  CBC in am  Await other recommendations by Cards and GI   History of Complete Heart Block, s/p PPM. EKG with paed rhythm, no acute findings appreciate cardiology follow-up  History of paroxysmal atrial fibrillation, stable, at this point anticoagulation is on hold  History of chronic systolic and diastolic heart failure, last 2D echo in July 2018 showing EF 25-30%. Echo is pending. TSH pending  At this time continue Coreg and Entresto.  Hypertension BP 147/46   Pulse   59    Continue home anti-hypertensive medications   Benign prostatic hypertrophy, no acute issues Continue Proscar   Hypothyroidism: Continue home Synthroid Check TSH   Hyperlipidemia Continue home statins Check lipid panel    Chronic kidney disease stage 3  Cr BL 1.4  currently at 1.58  Lab Results  Component Value Date   CREATININE 1.58 (H) 01/11/2017   CREATININE 1.36 (H) 11/17/2016   CREATININE 1.29 (H) 10/27/2016  Monitor closely in view of need for diuresis, in view of a history of CHF, with EF 25%  Type II Diabetes Current blood sugar level is 195 Lab Results  Component Value Date   HGBA1C 7.8 08/07/2016  Hgb A1C  SSI Hold oral meds   Headaches Contine Depakote   History of falls in the setting of neuropathy  PT/OT consult   DVT prophylaxis:  SCDS. Not an AC candidate due to h/o GIB  Code Status:    Full  Family Communication:  Discussed with patient Disposition Plan: Expect patient to be discharged to home after condition improves Consults called:    Cardiology and GI as per EDP  Admission status:  Inpatient tele   Sharene Butters, PA-C [facility]   Amion text  [phone]   01/11/2017, 2:10 PM

## 2017-01-11 NOTE — Telephone Encounter (Signed)
I spoke with pt and gave him instructions from [name]. Pt does not have transportation so will call 911 for EMS to transport him to hospital.

## 2017-01-11 NOTE — Progress Notes (Addendum)
New pt admission from ED. Pt brought to the floor in stable condition. Vitals taken. Initial Assessment done. All immediate pertinent needs to patient addressed. Patient Guide given to patient. Important safety instructions relating to hospitalization reviewed with patient. Patient verbalized understanding. Oral medicines hold per MD order, 2nd unit of blood running on receiving patient. Will continue to monitor pt.   Palma Holter, RN

## 2017-01-11 NOTE — ED Triage Notes (Signed)
Pt reports CP starting this AM.  Pt reports taking 1 SL NTG with relief, then 79min later developed more pain with exertion, he then administered 1 more SL NTG and pain was again relieved.  Pt arrives to ED CP free with stable vitals

## 2017-01-11 NOTE — ED Notes (Signed)
Admitting paged regarding no order for diet. Waiting for response

## 2017-01-11 NOTE — Telephone Encounter (Signed)
I spoke with pt. He already has previously scheduled appointment with [name] tomorrow. Pt reports for the last 4 days he has chest pain with any exertion. Occurs when walking across the room. [facility] and when he walks down the hall he has to stop and rest due to chest pain.  Prior to 4 days ago he was having slight pains.  He reports BP this morning was 128/44 and heart rate 62.  Took NTG last evening and this AM and pain was relieved. States pain goes away with rest but NTG relieves it quicker.  At current time he is not having any pain but he is not up moving around.  Will review with [name].

## 2017-01-11 NOTE — ED Notes (Signed)
Patient taken to Radiology.

## 2017-01-11 NOTE — ED Notes (Signed)
MD Long notified of positive blood occult.

## 2017-01-11 NOTE — ED Provider Notes (Signed)
Emergency Department Provider Note   I have reviewed the triage vital signs and the nursing notes.   HISTORY  Chief Complaint Chest Pain   HPI Evan [name] is a 84 y.o. male with PMH of CHF, CAD s/p CABG in 1996, DM, HTN, HLD, and PAF presents to the emergency department for evaluation of worsening chest pain with exertion and fatigue.  Patient states that over the last 4 days he has had increased symptoms but they worsened significantly this morning.  Patient typically is able to relieve his chest pain with stopping and resting.  Today he tried that but did not immediately relieve things so he took a total of 2 sublingual nitroglycerin.  He is currently chest pain-free.  His primary cardiologist is [name].  He denies any fevers, chills, productive cough, abdominal pain, vomiting, diarrhea. Patient is scheduled to see his Cardiologist tomorrow but didn't think he could make it.   Patient does have history of gastrointestinal bleeding.  Last week he noticed several days of very dark red almost black stool but states that has resolved.  Patient has experienced some lightheadedness especially today but attributed that to his nitroglycerin. Has seen [facility] in the past.    Past Medical History:  Diagnosis Date  . AC joint dislocation   . Allergic rhinitis   . Arthritis   . Atrial tachycardia ([place])   . BPH (benign prostatic hyperplasia)   . CAD   . CHF (congestive heart failure) ([place])   . Diarrhea   . DM    type 2  . GERD (gastroesophageal reflux disease)   . H/O: GI bleed   . Heart murmur   . HYPERCHOLESTEROLEMIA   . HYPERTENSION   . Hypothyroidism   . Iron deficiency anemia   . NEPHROLITHIASIS, HX OF   . Paroxysmal atrial fibrillation (H[facility])   . Presence of permanent cardiac pacemaker 12/12/2013  . Rib fracture   . Stable angina (HCC)   . Syncope   . Thyroid disease   . TRANSIENT ISCHEMIC ATTACKS, HX OF   . Trifascicular block    a. s/p MDT ADDRL1 pacemaker Dr  Rayann Heman  . Urinary frequency   . Vertigo   . Weakness of left leg     Patient Active Problem List   Diagnosis Date Noted  . BPH (benign prostatic hyperplasia) 01/11/2017  . Thyroid disease 01/11/2017  . Chest pain 01/11/2017  . Symptomatic anemia 01/11/2017  . Demand ischemia ([place])   . Fatigue 08/06/2016  . Change in consistency of stool 08/06/2016  . Pressure injury of skin 08/04/2016  . Atypical chest pain 07/30/2016  . Acute respiratory failure with hypoxia ([place]) 07/30/2016  . Acute respiratory failure with hypoxemia ([place])   . NSTEMI (non-ST elevated myocardial infarction) ([place]) 06/24/2016  . Rectal bleeding 06/23/2016  . Acute blood loss anemia 06/23/2016  . Patient had 2 or more falls in past year 01/20/2014  . Stage 3 chronic renal impairment associated with type 2 diabetes mellitus ([place]) 01/20/2014  . Acute on chronic systolic and diastolic heart failure, NYHA class 4 ([place]) 01/16/2014  . CAD S/P SVG-MO1/OM2 DES 12/11/13 12/13/2013  . Pacemaker implanted 12/12/13 (MDT) 12/13/2013  . Complete heart block ([place]) 12/13/2013  . HOH (hard of hearing) 12/13/2013  . Syncope 12/12/2013  . 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 Sick sinus syndrome ([place]) 12/10/2013  . PAF (paroxysmal atrial fibrillation) ([place]) 12/10/2013  . Tachy-brady syndrome ([place]) 12/10/2013  . Symptomatic bradycardia 12/07/2013  . Heart block  03/22/2013  . CAD (coronary artery disease) of artery bypass graft 03/23/2012  . Angina pectoris- rate related 03/23/2012  . Carotid bruit 03/23/2012  . CARDIAC MURMUR 12/03/2009  . Type 2 diabetes with nephropathy ([place]) 12/02/2009  . HYPERCHOLESTEROLEMIA 12/02/2009  . Iron deficiency anemia 12/02/2009  . Essential hypertension 12/02/2009  . CABG '96 12/02/2009  . ALLERGIC RHINITIS 12/02/2009  . GERD (gastroesophageal reflux disease) 12/02/2009  . TRANSIENT ISCHEMIC ATTACKS, HX OF 12/02/2009  . NEPHROLITHIASIS, HX OF 12/02/2009    Past Surgical History:  Procedure  Laterality Date  . AV FISTULA REPAIR    . BACK SURGERY  2000'S  . CARDIAC CATHETERIZATION  12/11/2013   Procedure: CORONARY STENT INTERVENTION;  Surgeon: Burnell Blanks, MD;  Location: [facility] CATH LAB;  Service: Cardiovascular;;  DES 3.5 x 15 Resolute to seq SVG of OM1/OM2   . CORONARY ARTERY BYPASS GRAFT  1996  . CORONARY STENT PLACEMENT  12/11/13   SVG-OM DES  . FEMORAL ARTERY ANEURYSM REPAIR     feroral pseudo-aneurysm repair  . INSERT / REPLACE / REMOVE PACEMAKER    . LEFT HEART CATHETERIZATION WITH CORONARY/GRAFT ANGIOGRAM N/A 12/11/2013   Procedure: LEFT HEART CATHETERIZATION WITH Beatrix Fetters;  Surgeon: Burnell Blanks, MD;  Location: [facility] CATH LAB;  Service: Cardiovascular;  Laterality: N/A;  . PERMANENT PACEMAKER INSERTION N/A 12/12/2013   MDT ADDRL1 pacemaker implnated by Dr Rayann Heman  . PTCA  1980's    Current Outpatient Rx  . Order #: [id number] Class: Historical Med  . Order #: [id number] Class: Normal  . Order #: [id number] Class: Normal  . Order #: [id number] Class: Normal  . Order #: [id number] Class: Normal  . Order #: [id number] Class: Normal  . Order #: [id number] Class: Historical Med  . Order #: [id number] Class: Normal  . Order #: [id number] Class: Historical Med  . Order #: [id number] Class: Normal  . Order #: [id number] Class: Historical Med  . Order #: [id number] Class: Normal  . Order #: [id number] Class: Historical Med  . Order #: [id number] Class: Normal  . Order #: [id number] Class: Normal  . Order #: [id number] Class: Normal  . Order #: [id number] Class: Historical Med  . Order #: [id number] Class: Normal  . Order #: [id number] Class: Normal  . Order #: [id number] Class: Normal    Allergies Clonidine derivatives; Codeine; Meperidine hcl; Motrin [ibuprofen]; Other; Penicillins; Demerol; Heparin; and Metformin and related  Family History  Problem Relation Age of Onset  . Diabetes Mother   . Coronary artery disease Father     Social History Social History    Tobacco Use  . Smoking status: Former Smoker    Last attempt to quit: 01/06/1956    Years since quitting: 61.0  . Smokeless tobacco: Never Used  Substance Use Topics  . Alcohol use: No    Alcohol/week: 0.0 oz  . Drug use: No    Review of Systems  Constitutional: No fever/chills. Positive fatigue and lightheadedness.  Eyes: No visual changes. ENT: No sore throat. Cardiovascular: Positive chest pain. Respiratory: Positive shortness of breath. Gastrointestinal: No abdominal pain.  No nausea, no vomiting.  No diarrhea.  No constipation. Genitourinary: Negative for dysuria. Musculoskeletal: Negative for back pain. Skin: Negative for rash. Neurological: Negative for headaches, focal weakness or numbness.  10-point ROS otherwise negative.  ____________________________________________   PHYSICAL EXAM:  VITAL SIGNS: ED Triage Vitals [01/11/17 1135]  Enc Vitals Group     BP (!) 119/41     Pulse Rate 60     Resp 20     Temp 98 F (36.7 C)  Temp Source Oral     SpO2 100 %     Weight 152 lb (68.9 kg)     Height 5\' 7"  (1.702 m)   Constitutional: Alert and oriented. Well appearing and in no acute distress. Eyes: Conjunctivae are somewhat pale.  Head: Atraumatic. Nose: No congestion/rhinnorhea. Mouth/Throat: Mucous membranes are moist.   Neck: No stridor.  Cardiovascular: Normal rate, regular rhythm. Good peripheral circulation. Grossly normal heart sounds.   Respiratory: Normal respiratory effort.  No retractions. Lungs CTAB. Gastrointestinal: Soft and nontender. No distention. Rectal exam with no melena. Dark brown stool with trace maroon colored streaks.  Musculoskeletal: No lower extremity tenderness nor edema. No gross deformities of extremities. Neurologic:  Normal speech and language. No gross focal neurologic deficits are appreciated.  Skin:  Skin is warm, dry and intact. No rash noted.  ____________________________________________   LABS (all labs ordered are  listed, but only abnormal results are displayed)  Labs Reviewed  BASIC METABOLIC PANEL - Abnormal; Notable for the following components:      Result Value   Glucose, Bld 195 (*)    BUN 32 (*)    Creatinine, Ser 1.58 (*)    Calcium 8.7 (*)    GFR calc non Af Amer 37 (*)    GFR calc Af Amer 42 (*)    All other components within normal limits  CBC - Abnormal; Notable for the following components:   RBC 2.82 (*)    Hemoglobin 7.4 (*)    HCT 24.5 (*)    RDW 17.3 (*)    All other components within normal limits  TROPONIN I - Abnormal; Notable for the following components:   Troponin I 0.51 (*)    All other components within normal limits  BRAIN NATRIURETIC PEPTIDE - Abnormal; Notable for the following components:   B Natriuretic Peptide 1,870.9 (*)    All other components within normal limits  URINALYSIS, ROUTINE W REFLEX MICROSCOPIC - Abnormal; Notable for the following components:   Glucose, UA 50 (*)    Ketones, ur 5 (*)    Leukocytes, UA MODERATE (*)    Bacteria, UA RARE (*)    All other components within normal limits  IRON AND TIBC - Abnormal; Notable for the following components:   Iron 23 (*)    Saturation Ratios 7 (*)    All other components within normal limits  FERRITIN - Abnormal; Notable for the following components:   Ferritin 16 (*)    All other components within normal limits  RETICULOCYTES - Abnormal; Notable for the following components:   RBC. 2.65 (*)    All other components within normal limits  HEMOGLOBIN A1C - Abnormal; Notable for the following components:   Hgb A1c MFr Bld 6.9 (*)    All other components within normal limits  POC OCCULT BLOOD, ED - Abnormal; Notable for the following components:   Fecal Occult Bld POSITIVE (*)    All other components within normal limits  CBG MONITORING, ED - Abnormal; Notable for the following components:   Glucose-Capillary 113 (*)    All other components within normal limits  TSH  VITAMIN B12  FOLATE  TROPONIN  I  TROPONIN I  COMPREHENSIVE METABOLIC PANEL  CBC  PROTIME-INR  I-STAT TROPONIN, ED  PREPARE RBC (CROSSMATCH)  TYPE AND SCREEN   ____________________________________________  EKG   EKG Interpretation  Date/Time:  Monday January 11 2017 11:52:18 EST Ventricular Rate:  60 PR Interval:    QRS Duration: 181 QT Interval:  493 QTC Calculation: 493 R Axis:   -81 Text Interpretation:  Paced rhythm.  LVH with secondary repolarization abnormality Confirmed by Nanda Quinton [phone]) on 01/11/2017 12:02:55 PM Also confirmed by Nanda Quinton [phone]), editor Laurena Spies [phone])  on 01/11/2017 12:15:47 PM       ____________________________________________  RADIOLOGY  Dg Chest 2 View  Result Date: 01/11/2017 CLINICAL DATA:  Chest pain. EXAM: CHEST  2 VIEW COMPARISON:  Chest x-ray dated August 02, 2016. FINDINGS: Unchanged left chest wall pacer device with leads terminating in the right atrium and right ventricle. Postsurgical changes related to prior CABG. Normal heart size. Atherosclerotic calcification of the aortic arch. Normal pulmonary vascularity. No focal consolidation, pleural effusion, or pneumothorax. No acute osseous abnormality. IMPRESSION: No active cardiopulmonary disease. Electronically Signed   By: Titus Dubin M.D.   On: 01/11/2017 12:47    ____________________________________________   PROCEDURES  Procedure(s) performed:   .Critical Care Performed by: Margette Fast, MD Authorized by: Margette Fast, MD   Critical care provider statement:    Critical care time (minutes):  31   Critical care time was exclusive of:  Separately billable procedures and treating other patients and teaching time   Critical care was necessary to treat or prevent imminent or life-threatening deterioration of the following conditions:  Dehydration and circulatory failure   Critical care was time spent personally by me on the following activities:  Blood draw for specimens, development of  treatment plan with patient or surrogate, discussions with consultants, evaluation of patient's response to treatment, examination of patient, obtaining history from patient or surrogate, ordering and performing treatments and interventions, ordering and review of laboratory studies, ordering and review of radiographic studies, pulse oximetry, re-evaluation of patient's condition and review of old charts   I assumed direction of critical care for this patient from another provider in my specialty: no        ____________________________________________   INITIAL IMPRESSION / [place] / ED COURSE  Pertinent labs & imaging results that were available during my care of the patient were reviewed by me and considered in my medical decision making (see chart for details).  To the emergency department with worsening fatigue and exertional chest pain.  Has a long cardiac history including CABG in 1996. Followed by [name] with Cards and [facility] with GI bleeding in the past. On Plavix. No anticoagulation. No melena on rectal exam. Hb down 2g since September 2018. Unclear if the patient's symptoms are CAD-related or anemia related. Plan for admission and consideration of blood transfusion. Patient states that he "made a death pact" with himself regarding colonoscopy meaning that he would rather die than get another one. Will page [facility] for consultation.   Spoke with Trish from Cardiology. They will be consulting on the patient. Did not hear back from GI. Inpatient team will need to follow up. No indication for emergent GI intervention.   Discussed patient's case with Hospitalist to request admission. Patient and family (if present) updated with plan. Care transferred to Hospitalist service.  I reviewed all nursing notes, vitals, pertinent old records, EKGs, labs, imaging (as available).  ____________________________________________  FINAL CLINICAL IMPRESSION(S) / ED DIAGNOSES  Final  diagnoses:  Precordial chest pain  Fatigue, unspecified type  Symptomatic anemia     MEDICATIONS GIVEN DURING THIS VISIT:  Medications  amiodarone (PACERONE) tablet 100 mg (not administered)  atorvastatin (LIPITOR) tablet 10 mg (not administered)  benzonatate (TESSALON) capsule 200 mg (not administered)  carvedilol (  COREG) tablet 12.5 mg (not administered)  divalproex (DEPAKOTE ER) 24 hr tablet 250 mg (not administered)  sacubitril-valsartan (ENTRESTO) 24-26 mg per tablet (not administered)  ferrous sulfate TBEC 325 mg (not administered)  finasteride (PROSCAR) tablet 5 mg (not administered)  folic acid (FOLVITE) tablet 400 mcg (not administered)  furosemide (LASIX) tablet 80 mg (not administered)  isosorbide mononitrate (IMDUR) 24 hr tablet 30 mg (not administered)  levothyroxine (SYNTHROID, LEVOTHROID) tablet 112 mcg (not administered)  Melatonin TABS 10 mg (not administered)  saccharomyces boulardii (FLORASTOR) capsule 250 mg (not administered)  ondansetron (ZOFRAN) injection 4 mg (not administered)  acetaminophen (TYLENOL) tablet 650 mg (not administered)    Or  acetaminophen (TYLENOL) suppository 650 mg (not administered)  senna-docusate (Senokot-S) tablet 1 tablet (not administered)  bisacodyl (DULCOLAX) suppository 10 mg (not administered)  gi cocktail (Maalox,Lidocaine,Donnatal) (not administered)  morphine 4 MG/ML injection 1 mg (not administered)  HYDROcodone-acetaminophen (NORCO/VICODIN) 5-325 MG per tablet 1-2 tablet (not administered)  ALPRAZolam (XANAX) tablet 0.25 mg (not administered)  furosemide (LASIX) injection 20 mg (not administered)  insulin aspart (novoLOG) injection 0-9 Units (not administered)  0.9 %  sodium chloride infusion (0 mL/hr Intravenous Paused 01/11/17 1908)    Note:  This document was prepared using Dragon voice recognition software and may include unintentional dictation errors.  Nanda Quinton, MD Emergency Medicine    Long, [facility],  MD 01/11/17 [phone]

## 2017-01-11 NOTE — Telephone Encounter (Signed)
Reviewed with [name] who recommends pt go to ED for evaluation. I placed call to pt and left message to call back.

## 2017-01-11 NOTE — Telephone Encounter (Signed)
New message     Pt c/o of Chest Pain: STAT if CP now or developed within 24 hours  1. Are you having CP right now?  Yes - started 3 days ago and it has gotten progressively worse   2. Are you experiencing any other symptoms (ex. SOB, nausea, vomiting, sweating)? No other symptoms  3. How long have you been experiencing CP? 3 days  4. Is your CP continuous or coming and going? Chest pains come when he tries to walk or do anything, they go away when he is sitting or lying down   5. Have you taken Nitroglycerin?  1 nitro last at dinner and 1 nitro this morning and it helped ease the pain  ?

## 2017-01-11 NOTE — ED Notes (Signed)
Pt stood to urinate with assistance from this tech.

## 2017-01-11 NOTE — Consult Note (Signed)
Cardiology Consultation:   Patient ID: Evan [name]; [id number]; 1923/04/25   Admit date: 01/11/2017 Date of Consult: 01/11/2017  Primary Care Provider: Deland Pretty, Evan [name] Primary Cardiologist: Evan Rouge, Evan [name] Primary Electrophysiologist:     Patient Profile:   Evan [name] is a 84 y.o. male with a hx of CAD s/p CABG 1996 on plavix, DES to SVG to OM1/2 2015, DM, HTN, HLD, PAF (on amiodarone, no anticoagulation due to muscle atrophy and frequent falls), complete heart block s/p PPM, and chronic systolic and diastolic heart failure who is being seen today for the evaluation of chest pain on exertion at the request of Dr. Lorin [name].  History of Present Illness:   Evan [name] is known to this service and last saw Evan [name] in clinic on 11/17/16. H was admitted 06/2016 with hematochezia and chest pain, with Hb 8 and troponin 8.21. No heparin and ASA and plavix were D/[name]'ed for bleeding. Readmitted 07/2016 with CHF, echo showed worsening EF of 25-30%. Evan [name] was started and lasix increased to BID, although patient was only taking daily. His dry weight appears to be near 153 lbs at last clinic visit. He has since restarted plavix. No anticoagulation.  He called the office this morning stating he was experiencing worsening chest pain with exertion. He lives in assisted living and can no longer walk down the hall without stopping because of chest pain. Generally rest relieves the chest pain, but he took nitro for quicker relief last evening. He woke up without chest pain, but then had chest pain with walking again. Today he had to take nitro SL x 2 for some relief. Pt was scheduled to see Dr. Johnsie [name] in clinic tomorrow, but felt he couldn't wait that long. He states this started about a week ago and was accompanied by fatigue.  EMS was dispatched and he was brought to [name]. Troponin on arrival was negative. EKG with paced rhythm.  It was noted that the pt has a history of GI bleeding and  noticed several days of very dark red almost black stool. He thinks this coincided with the onset of his exertional chest pain. He has also had some lightheadedness, but attributed this to nitro use.    Hb 7.4, from 9.4 on 09/29/16.  Pt states he will refuse lasix at night.   Past Medical History:  Diagnosis Date  . AC joint dislocation   . ALLERGIC RHINITIS   . Allergic rhinitis   . Anginal pain ([place])   . Arthritis   . Atrial tachycardia ([place])   . BPH (benign prostatic hyperplasia)   . CAD   . CHF (congestive heart failure) ([place])   . Diarrhea   . DM    type 2  . GERD (gastroesophageal reflux disease)   . H/O: GI bleed   . Heart murmur   . HYPERCHOLESTEROLEMIA   . HYPERTENSION   . Hypothyroidism   . Iron deficiency anemia   . NEPHROLITHIASIS, HX OF   . Paroxysmal atrial fibrillation (H[facility])   . Presence of permanent cardiac pacemaker 12/12/2013  . Rib fracture   . Stable angina (H[facility])   . Syncope   . Thyroid disease   . TRANSIENT ISCHEMIC ATTACKS, HX OF   . Trifascicular block    a. s/p MDT ADDRL1 pacemaker Dr Evan [name]  . Urinary frequency   . Vertigo   . Weakness of left leg     Past Surgical History:  Procedure Laterality Date  . AV FISTULA REPAIR    .  BACK SURGERY  2000'S  . CARDIAC CATHETERIZATION  12/11/2013   Procedure: CORONARY STENT INTERVENTION;  Surgeon: Evan Blanks, Evan [name];  Location: [name] CATH LAB;  Service: Cardiovascular;;  DES 3.5 x 15 Resolute to seq SVG of OM1/OM2   . CORONARY ARTERY BYPASS GRAFT  1996  . CORONARY STENT PLACEMENT  12/11/13   SVG-OM DES  . FEMORAL ARTERY ANEURYSM REPAIR     feroral pseudo-aneurysm repair  . INSERT / REPLACE / REMOVE PACEMAKER    . LEFT HEART CATHETERIZATION WITH CORONARY/GRAFT ANGIOGRAM N/A 12/11/2013   Procedure: LEFT HEART CATHETERIZATION WITH Beatrix Fetters;  Surgeon: Evan Blanks, Evan [name];  Location: [facility] CATH LAB;  Service: Cardiovascular;  Laterality: N/A;  . PERMANENT PACEMAKER INSERTION N/A  12/12/2013   MDT ADDRL1 pacemaker implnated by Dr Evan [name]  . PTCA  1980's     Home Medications:  Prior to Admission medications   Medication Sig Start Date End Date Taking? Authorizing Provider  acetaminophen (TYLENOL) 325 MG tablet Take 650 mg by mouth every 6 (six) hours as needed for mild pain.   Yes Provider, Historical, Evan [name]  amiodarone (PACERONE) 100 MG tablet TAKE 1 TABLET BY MOUTH EVERY DAY 08/26/16  Yes Evan Hector, Evan [name]  atorvastatin (LIPITOR) 10 MG tablet Take 1 tablet (10 mg total) by mouth daily. 08/24/16  Yes Medina-Vargas, Evan C, NP  benzonatate (TESSALON) 200 MG capsule Take 1 capsule (200 mg total) by mouth 3 (three) times daily as needed for cough. 08/24/16  Yes Medina-Vargas, Evan C, NP  carvedilol (COREG) 12.5 MG tablet Take 1 tablet (12.5 mg total) by mouth 2 (two) times daily with a meal. 08/24/16  Yes Medina-Vargas, Evan C, NP  clopidogrel (PLAVIX) 75 MG tablet TAKE 1 TABLET BY MOUTH EVERY DAY WITH BREAKFAST 08/26/16  Yes Evan Hector, Evan [name]  Cobalamine Combinations (VITAMIN B12-FOLIC ACID PO) Take 1 tablet by mouth every Monday, Wednesday, and Friday.   Yes Provider, Historical, Evan [name]  divalproex (DEPAKOTE ER) 250 MG 24 hr tablet Take 1 tablet (250 mg total) by mouth at bedtime. 08/24/16  Yes Medina-Vargas, Evan C, NP  ENTRESTO 24-26 MG TAKE 1 TABLET BY MOUTH 2 TIMES DAILY 08/26/16  Yes Evan Hector, Evan [name]  ferrous sulfate 324 (65 Fe) MG TBEC Take 1 tablet by mouth 2 (two) times daily.   Yes Provider, Historical, Evan [name]  finasteride (PROSCAR) 5 MG tablet Take 1 tablet (5 mg total) by mouth daily. 08/24/16  Yes Medina-Vargas, Evan C, NP  folic acid (FOLVITE) 315 MCG tablet Take 400 mcg by mouth 3 (three) times a week. M, Wed,Fri   Yes Provider, Historical, Evan [name]  furosemide (LASIX) 80 MG tablet Take 1 1/2 tablet by mouth daily for 3 days then go back to 1 tablet by mouth daily 11/17/16  Yes Lenze, Evan Moccasin, PA-[name]  isosorbide mononitrate (IMDUR) 30 MG 24 hr tablet Take 1 tablet (30  mg total) by mouth daily. 08/24/16  Yes Medina-Vargas, Evan C, NP  levothyroxine (SYNTHROID, LEVOTHROID) 112 MCG tablet Take 1 tablet (112 mcg total) by mouth daily before breakfast. 08/24/16  Yes Medina-Vargas, Evan C, NP  MELATONIN PO Take 10 mg by mouth at bedtime as needed.   Yes Provider, Historical, Evan [name]  nitroGLYCERIN (NITROSTAT) 0.4 MG SL tablet PLACE 1 TABLET (0.4 MG TOTAL) UNDER THE TONGUE EVERY 5 (FIVE) MINUTES AS NEEDED FOR CHEST PAIN. 08/24/16  Yes Medina-Vargas, Evan C, NP  saccharomyces boulardii (FLORASTOR) 250 MG capsule Take 1 capsule (250 mg total) by mouth 2 (two) times  daily. 08/24/16  Yes Medina-Vargas, Evan C, NP  sitaGLIPtin (JANUVIA) 25 MG tablet Take 1 tablet (25 mg total) by mouth daily. Take one tablet by mouth once daily to control blood sugar 08/24/16  Yes Medina-Vargas, Evan C, NP    Inpatient Medications: Scheduled Meds:  Continuous Infusions: . sodium chloride     PRN Meds:   Allergies:    Allergies  Allergen Reactions  . Clonidine Derivatives     unknown reaction   . Codeine     unknown reaction   . Meperidine Hcl     REACTION: Hypotension  . Motrin [Ibuprofen]     unknown reaction   . Other     Celery - unknown reaction  . Penicillins     unknown reaction   . Demerol Other (See Comments)    hypotension  . Heparin Other (See Comments)    hypotension  . Metformin And Related Diarrhea    Social History:   Social History   Socioeconomic History  . Marital status: Widowed    Spouse name: Not on file  . Number of children: Not on file  . Years of education: Not on file  . Highest education level: Not on file  Social Needs  . Financial resource strain: Not on file  . Food insecurity - worry: Not on file  . Food insecurity - inability: Not on file  . Transportation needs - medical: Not on file  . Transportation needs - non-medical: Not on file  Occupational History  . Not on file  Tobacco Use  . Smoking status: Former Smoker      Last attempt to quit: 01/06/1956    Years since quitting: 61.0  . Smokeless tobacco: Never Used  Substance and Sexual Activity  . Alcohol use: No    Alcohol/week: 0.0 oz  . Drug use: No  . Sexual activity: Not Currently  Other Topics Concern  . Not on file  Social History Narrative   Pt is married and lives with his wife.  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      Family History:    Family History  Problem Relation Age of Onset  . Diabetes Mother   . Coronary artery disease Father      ROS:  Please see the history of present illness.  ROS  All other ROS reviewed and negative.     Physical Exam/Data:   Vitals:   01/11/17 1135 01/11/17 1205  BP: (!) 119/41 (!) 130/41  Pulse: 60 (!) 59  Resp: 20 (!) 21  Temp: 98 F (36.7 [name]) 98 F (36.7 [name])  TempSrc: Oral Oral  SpO2: 100% 100%  Weight: 152 lb (68.9 kg)   Height: 5\' 7"  (1.702 m)    No intake or output data in the 24 hours ending 01/11/17 1438 Filed Weights   01/11/17 1135  Weight: 152 lb (68.9 kg)   Body mass index is 23.81 kg/m.  General:  Well nourished, well developed, in no acute distress HEENT: normal Neck: + JVD Vascular: No carotid bruits Cardiac:  normal S1, S2, RRR, 4/6 systolic murmur Lungs:  clear to auscultation bilaterally, no wheezing, rhonchi or rales  Abd: soft, nontender, no hepatomegaly  Ext: at least 1+ edema Musculoskeletal:  No deformities, BUE and BLE strength normal and equal Skin: warm and dry  Neuro:  CNs 2-12 intact, no focal abnormalities noted Psych:  Normal affect   EKG:  The EKG  was personally reviewed and demonstrates:  Paced rhythm Telemetry:  Telemetry was personally reviewed and demonstrates:  Paced rhythm  Relevant CV Studies:  Echo 07/30/16: Study Conclusions - Left ventricle: The cavity size was normal. Systolic function was   severely reduced. The estimated ejection fraction was in the   range of 25% to 30%. Severe  diffuse hypokinesis with no   identifiable regional variations. There is substantial systolic   dyssynchrony, both septal-to-lateral wall and apex-to-base.   Doppler parameters are consistent with restrictive physiology,   indicative of decreased left ventricular diastolic compliance   and/or increased left atrial pressure. Acoustic contrast   opacification revealed no evidence ofthrombus. - Ventricular septum: Septal motion showed abnormal function,   dyssynergy, and paradox. These changes are consistent with right   ventricular pacing. - Aortic valve: Noncoronary cusp mobility was severely restricted.   Transvalvular velocity was increased less than expected, due to   low cardiac output. There was mild stenosis. There was mild   regurgitation directed centrally in the LVOT. Valve area (VTI):   1.41 cm^2. - Mitral valve: Calcified annulus. There was mild to moderate   regurgitation directed centrally. - Left atrium: The atrium was moderately dilated. - Right ventricle: Systolic function was mildly reduced. - Tricuspid valve: There was moderate regurgitation. - Pulmonary arteries: Systolic pressure was moderately increased.   PA peak pressure: 63 mm Hg (S).  Laboratory Data:  Chemistry Recent Labs  Lab 01/11/17 1202  NA 139  K 3.9  CL 107  CO2 22  GLUCOSE 195*  BUN 32*  CREATININE 1.58*  CALCIUM 8.7*  GFRNONAA 37*  GFRAA 42*  ANIONGAP 10    No results for input(s): PROT, ALBUMIN, AST, ALT, ALKPHOS, BILITOT in the last 168 hours. Hematology Recent Labs  Lab 01/11/17 1202  WBC 6.4  RBC 2.82*  HGB 7.4*  HCT 24.5*  MCV 86.9  MCH 26.2  MCHC 30.2  RDW 17.3*  PLT 165   Cardiac EnzymesNo results for input(s): TROPONINI in the last 168 hours.  Recent Labs  Lab 01/11/17 1222  TROPIPOC 0.06    BNPNo results for input(s): BNP, PROBNP in the last 168 hours.  DDimer No results for input(s): DDIMER in the last 168 hours.  Radiology/Studies:  Dg Chest 2  View  Result Date: 01/11/2017 CLINICAL DATA:  Chest pain. EXAM: CHEST  2 VIEW COMPARISON:  Chest x-ray dated August 02, 2016. FINDINGS: Unchanged left chest wall pacer device with leads terminating in the right atrium and right ventricle. Postsurgical changes related to prior CABG. Normal heart size. Atherosclerotic calcification of the aortic arch. Normal pulmonary vascularity. No focal consolidation, pleural effusion, or pneumothorax. No acute osseous abnormality. IMPRESSION: No active cardiopulmonary disease. Electronically Signed   By: Titus Dubin M.D.   On: 01/11/2017 12:47    Assessment and Plan:   1. Chest pain, known CAD, s/p CABG x 5, last cath 2015 with patent grafts but DES to SVG-OM - considered a poor candidate for further invasive procedures (07/31/16) - initial troponin negative and EKG with paced rhythm - its unclear if his chest pain on exertion and fatigue are due to stable angina or blood loss anemia, since this started with the onset of dark stools and lightheadedness - he has a history of chest pain with GI bleeding (06/2016) - will continue to trend troponin, but recommend against starting heparin drip - GI consult pending - will treat chest pain conservatively for now, will defer to GI for anemia workup  2. Blood loss anemia - recommend holding plavix - pt is not an anticoagulation candidate given hx of GI bleed and frequent falls - lasix with blood products to avoid volume overload - pt states he will refuse lasix at night  3. CHB s/p PPM in place - EKG with paced rhythm - stable  4. PAF - pt with paced rhythm, stable, no AC  5. Chronic systolic and diastolic heart failure - replete volume loss in the setting of reduced EF - continue coreg and entresto - sCr 1.58, baseline 1.36-1.53 - supplement with extra lasix with any blood product transfusion to avoid volume overlaod   For questions or updates, please contact [facility] Please consult [email or web address]  for contact info under Cardiology/STEMI.   Signed, Tami Lin [place], [place]  01/11/2017 2:38 PM

## 2017-01-11 NOTE — ED Notes (Signed)
Rn attempted x2  For IV without success

## 2017-01-12 ENCOUNTER — Other Ambulatory Visit: Payer: Self-pay

## 2017-01-12 ENCOUNTER — Ambulatory Visit: Payer: Medicare Other | Admitting: Cardiovascular Disease

## 2017-01-12 DIAGNOSIS — E7800 Pure hypercholesterolemia, unspecified: Secondary | ICD-10-CM

## 2017-01-12 DIAGNOSIS — I214 Non-ST elevation (NSTEMI) myocardial infarction: Secondary | ICD-10-CM

## 2017-01-12 DIAGNOSIS — R5383 Other fatigue: Secondary | ICD-10-CM

## 2017-01-12 LAB — TYPE AND SCREEN
ABO/RH(D): O POS
Antibody Screen: NEGATIVE
Unit division: 0
Unit division: 0

## 2017-01-12 LAB — BPAM RBC
Blood Product Expiration Date: 201902022359
Blood Product Expiration Date: 201902022359
ISSUE DATE / TIME: 201901071625
ISSUE DATE / TIME: 201901071848
Unit Type and Rh: 5100
Unit Type and Rh: 5100

## 2017-01-12 LAB — CBC
HEMATOCRIT: 28.8 % — AB (ref 39.0–52.0)
HEMATOCRIT: 30.5 % — AB (ref 39.0–52.0)
HEMOGLOBIN: 9.4 g/dL — AB (ref 13.0–17.0)
HEMOGLOBIN: 9.5 g/dL — AB (ref 13.0–17.0)
MCH: 27.8 pg (ref 26.0–34.0)
MCH: 26.6 pg (ref 26.0–34.0)
MCHC: 32.6 g/dL (ref 30.0–36.0)
MCHC: 31.1 g/dL (ref 30.0–36.0)
MCV: 85.2 fL (ref 78.0–100.0)
MCV: 85.4 fL (ref 78.0–100.0)
Platelets: 141 10*3/uL — ABNORMAL LOW (ref 150–400)
Platelets: 155 10*3/uL (ref 150–400)
RBC: 3.38 MIL/uL — AB (ref 4.22–5.81)
RBC: 3.57 MIL/uL — ABNORMAL LOW (ref 4.22–5.81)
RDW: 16.6 % — ABNORMAL HIGH (ref 11.5–15.5)
RDW: 16 % — AB (ref 11.5–15.5)
WBC: 6.8 10*3/uL (ref 4.0–10.5)
WBC: 7.1 10*3/uL (ref 4.0–10.5)

## 2017-01-12 LAB — COMPREHENSIVE METABOLIC PANEL
ALBUMIN: 3.2 g/dL — AB (ref 3.5–5.0)
ALK PHOS: 55 U/L (ref 38–126)
ALT: 10 U/L — ABNORMAL LOW (ref 17–63)
ANION GAP: 12 (ref 5–15)
AST: 20 U/L (ref 15–41)
BILIRUBIN TOTAL: 1.3 mg/dL — AB (ref 0.3–1.2)
BUN: 32 mg/dL — AB (ref 6–20)
CO2: 23 mmol/L (ref 22–32)
Calcium: 8.4 mg/dL — ABNORMAL LOW (ref 8.9–10.3)
Chloride: 108 mmol/L (ref 101–111)
Creatinine, Ser: 1.55 mg/dL — ABNORMAL HIGH (ref 0.61–1.24)
GFR calc Af Amer: 43 mL/min — ABNORMAL LOW (ref >60)
GFR calc non Af Amer: 37 mL/min — ABNORMAL LOW (ref >60)
GLUCOSE: 125 mg/dL — AB (ref 65–99)
Potassium: 3.7 mmol/L (ref 3.5–5.1)
SODIUM: 143 mmol/L (ref 135–145)
Total Protein: 5.8 g/dL — ABNORMAL LOW (ref 6.5–8.1)

## 2017-01-12 LAB — GLUCOSE, CAPILLARY
GLUCOSE-CAPILLARY: 128 mg/dL — AB (ref 65–99)
GLUCOSE-CAPILLARY: 168 mg/dL — AB (ref 65–99)
Glucose-Capillary: 117 mg/dL — ABNORMAL HIGH (ref 65–99)
Glucose-Capillary: 192 mg/dL — ABNORMAL HIGH (ref 65–99)

## 2017-01-12 LAB — HEMOGLOBIN AND HEMATOCRIT, BLOOD
HCT: 30.1 % — ABNORMAL LOW (ref 39.0–52.0)
HEMATOCRIT: 31.5 % — AB (ref 39.0–52.0)
Hemoglobin: 10.1 g/dL — ABNORMAL LOW (ref 13.0–17.0)
Hemoglobin: 9.5 g/dL — ABNORMAL LOW (ref 13.0–17.0)

## 2017-01-12 LAB — TROPONIN I: TROPONIN I: 3.05 ng/mL — AB (ref <0.03)

## 2017-01-12 LAB — PROTIME-INR
INR: 1.2
Prothrombin Time: 15.1 seconds (ref 11.4–15.2)

## 2017-01-12 MED ORDER — SODIUM CHLORIDE 0.9 % IV SOLN
INTRAVENOUS | Status: DC
  Administered 2017-01-12 – 2017-01-13 (×2): via INTRAVENOUS

## 2017-01-12 NOTE — Progress Notes (Addendum)
Pt had a  Second episode of stool with blood at 2am, vitals stable (BP 163 55), pt's troponin is also trending up, pt does not have any symptoms so far, no any complain of chest pain, denies SOB and distress. paged MD to make aware about the situation

## 2017-01-12 NOTE — Progress Notes (Signed)
Progress Note  Patient Name: Evan [name] Date of Encounter: 01/12/2017  Primary Cardiologist: Jenkins Rouge, MD   Subjective   Feeling well.  No chest pain, or shortness of breath.  No abdominal pain.  He continues to have hematochezia.   Inpatient Medications    Scheduled Meds: . amiodarone  100 mg Oral Daily  . atorvastatin  10 mg Oral QHS  . carvedilol  12.5 mg Oral BID WC  . divalproex  250 mg Oral QHS  . ferrous sulfate  325 mg Oral BID  . finasteride  5 mg Oral Daily  . folic acid  409 mcg Oral Once per day on Mon Wed Fri  . furosemide  20 mg Intravenous Once  . furosemide  80 mg Oral Daily  . insulin aspart  0-9 Units Subcutaneous TID WC  . isosorbide mononitrate  30 mg Oral Daily  . lactobacillus acidophilus  1 tablet Oral BID  . levothyroxine  112 mcg Oral QAC breakfast  . sacubitril-valsartan  1 tablet Oral BID   Continuous Infusions: . sodium chloride 50 mL/hr at 01/12/17 1040   PRN Meds: acetaminophen **OR** acetaminophen, ALPRAZolam, benzonatate, bisacodyl, gi cocktail, HYDROcodone-acetaminophen, Melatonin, morphine injection, ondansetron (ZOFRAN) IV, senna-docusate   Vital Signs    Vitals:   01/11/17 2130 01/12/17 0200 01/12/17 0500 01/12/17 1037  BP: (!) 150/80 (!) 163/55 (!) 150/80 (!) 171/62  Pulse: 66 68 68 61  Resp: 18 18 18    Temp: 98 F (36.7 C)  98 F (36.7 C)   TempSrc: Oral     SpO2: 98% 100% 100%   Weight:   151 lb 3.2 oz (68.6 kg)   Height:        Intake/Output Summary (Last 24 hours) at 01/12/2017 1144 Last data filed at 01/12/2017 0900 Gross per 24 hour  Intake 630 ml  Output 712 ml  Net -82 ml   Filed Weights   01/11/17 1135 01/11/17 2009 01/12/17 0500  Weight: 152 lb (68.9 kg) 153 lb 1.6 oz (69.4 kg) 151 lb 3.2 oz (68.6 kg)    Telemetry    APVP.  No events  - Personally Reviewed  ECG    N/a - Personally Reviewed  Physical Exam   VS:  BP (!) 134/43 (BP Location: Left Arm)   Pulse (!) 59   Temp 98 F (36.7 C)  (Oral)   Resp 18   Ht 5\' 7"  (1.702 m)   Wt 151 lb 3.2 oz (68.6 kg) Comment: scale b  SpO2 98%   BMI 23.68 kg/m  , BMI Body mass index is 23.68 kg/m. GENERAL:  Well appearing HEENT: Pupils equal round and reactive, fundi not visualized, oral mucosa unremarkable NECK:  No jugular venous distention, waveform within normal limits, carotid upstroke brisk and symmetric, no bruits, no thyromegaly LYMPHATICS:  No cervical adenopathy LUNGS:  Clear to auscultation bilaterally HEART:  RRR.  PMI not displaced or sustained,S1 and S2 within normal limits, no S3, no S4, no clicks, no rubs, III/VI systolic murmur at the LUSB.  ABD:  Flat, positive bowel sounds normal in frequency in pitch, no bruits, no rebound, no guarding, no midline pulsatile mass, no hepatomegaly, no splenomegaly EXT:  2 plus pulses throughout, 1+ pitting edema to above the ankle bilaterally, no cyanosis no clubbing SKIN:  No rashes no nodules NEURO:  Cranial nerves II through XII grossly intact, motor grossly intact throughout PSYCH:  Cognitively intact, oriented to person place and time   [employer]  Recent Labs  Lab 01/11/17 1202 01/12/17 0000  NA 139 143  K 3.9 3.7  CL 107 108  CO2 22 23  GLUCOSE 195* 125*  BUN 32* 32*  CREATININE 1.58* 1.55*  CALCIUM 8.7* 8.4*  PROT  --  5.8*  ALBUMIN  --  3.2*  AST  --  20  ALT  --  10*  ALKPHOS  --  55  BILITOT  --  1.3*  GFRNONAA 37* 37*  GFRAA 42* 43*  ANIONGAP 10 12     Hematology Recent Labs  Lab 01/11/17 1202 01/11/17 1645  01/12/17 0000 01/12/17 0317 01/12/17 0810  WBC 6.4  --   --  6.8 7.1  --   RBC 2.82* 2.65*  --  3.57* 3.38*  --   HGB 7.4*  --    < > 9.5* 9.4* 10.1*  HCT 24.5*  --    < > 30.5* 28.8* 31.5*  MCV 86.9  --   --  85.4 85.2  --   MCH 26.2  --   --  26.6 27.8  --   MCHC 30.2  --   --  31.1 32.6  --   RDW 17.3*  --   --  16.0* 16.6*  --   PLT 165  --   --  155 141*  --    < > = values in this interval not displayed.    Cardiac  Enzymes Recent Labs  Lab 01/11/17 1654 01/11/17 1908 01/11/17 2336  TROPONINI 0.51* 1.02* 3.05*    Recent Labs  Lab 01/11/17 1222  TROPIPOC 0.06     BNP Recent Labs  Lab 01/11/17 1645  BNP 1,870.9*     DDimer No results for input(s): DDIMER in the last 168 hours.   Radiology    Dg Chest 2 View  Result Date: 01/11/2017 CLINICAL DATA:  Chest pain. EXAM: CHEST  2 VIEW COMPARISON:  Chest x-ray dated August 02, 2016. FINDINGS: Unchanged left chest wall pacer device with leads terminating in the right atrium and right ventricle. Postsurgical changes related to prior CABG. Normal heart size. Atherosclerotic calcification of the aortic arch. Normal pulmonary vascularity. No focal consolidation, pleural effusion, or pneumothorax. No acute osseous abnormality. IMPRESSION: No active cardiopulmonary disease. Electronically Signed   By: Titus Dubin M.D.   On: 01/11/2017 12:47    Cardiac Studies   Echo 07/30/16:  Study Conclusions  - Left ventricle: The cavity size was normal. Systolic function was   severely reduced. The estimated ejection fraction was in the   range of 25% to 30%. Severe diffuse hypokinesis with no   identifiable regional variations. There is substantial systolic   dyssynchrony, both septal-to-lateral wall and apex-to-base.   Doppler parameters are consistent with restrictive physiology,   indicative of decreased left ventricular diastolic compliance   and/or increased left atrial pressure. Acoustic contrast   opacification revealed no evidence ofthrombus. - Ventricular septum: Septal motion showed abnormal function,   dyssynergy, and paradox. These changes are consistent with right   ventricular pacing. - Aortic valve: Noncoronary cusp mobility was severely restricted.   Transvalvular velocity was increased less than expected, due to   low cardiac output. There was mild stenosis. There was mild   regurgitation directed centrally in the LVOT. Valve area  (VTI):   1.41 cm^2. - Mitral valve: Calcified annulus. There was mild to moderate   regurgitation directed centrally. - Left atrium: The atrium was moderately dilated. - Right ventricle: Systolic function was mildly reduced. -  Tricuspid valve: There was moderate regurgitation. - Pulmonary arteries: Systolic pressure was moderately increased.   PA peak pressure: 63 mm Hg (S).  Patient Profile     Mr. Viglione is a 68M with CAD s/p CABP, prior PCI, hypertension, hyperlipidemia, chronic systolic and diastolic heart failure, paroxysmal atrial fibrillation not on anticoagulation, CHB s/p PPM, and recurrent GIB here with melena and chest pain.  Assessment & Plan    # CAD s/p CABG: # Elevated troponin: Patient denies chest pain this AM.  No plans for cath at this time 2/2 GI bleed.  He reports that he has not taken clopidogrel since his hospitalization for GI bleed 06/2016.   # Acute blood loss anemia:  Patient continues to have GI bleeding.  He reports melena prior to admission and now has hematochezia.  GI consultation pending.  H/H stable after transfusion.  # Chronic systolic and diastolic heart failure: # Hypertension:  Antihypertensives were initially held.  After his morning dose of medications his blood pressure is looking much better.  Continue carvedilol and Entresto.  He has mild lower extremity edema.  Mr. Butterfield is mildly volume overloaded.  His home Lasix was resumed today.  # Hyperlipidemia: Continue atorvastatin.   # Chronic systolic and diastolic heart failure: Mild lower extremity edema.    # Paroxysmal atrial fibrillation: Currently AP.  Continue amiodarone and carvedilol.  No anticoagulation 2/2 recurrent GI bleed.  # CHB: PPM functioning properly.   For questions or updates, please contact [place] Please consult [email or web address] for contact info under Cardiology/STEMI.      Signed, Skeet Latch, MD  01/12/2017, 11:44 AM

## 2017-01-12 NOTE — Evaluation (Signed)
Occupational Therapy Evaluation Patient Details Name: Evan [name] MRN: [id number] DOB: 07/07/1923 Today's Date: 01/12/2017    History of Present Illness Pt is a 84 y.o. male admitted 01/11/17 with worsening chest pain with exertion and GI bleed. Worked up for chest pain of cardiac nature vs. symptomatic anemia. Pt not a candidate for invasive procedures; being treated medically without anticoagulation. PMH includes CHF, CAD s/p CABG (1996), HTN, HLD, PAF, GERD, CKD III, GI bleed.   Clinical Impression   Pt is at sup level with ADLs/selfcare and ADL mobility using RW. All education completed and no further acute OT is indicated at this time    Follow Up Recommendations  No OT follow up;Supervision - Intermittent    Equipment Recommendations  None recommended by OT    Recommendations for Other Services       Precautions / Restrictions Precautions Precautions: Fall Restrictions Weight Bearing Restrictions: No      Mobility Bed Mobility               General bed mobility comments: pt up in recliner upon arrival  Transfers Overall transfer level: Needs assistance Equipment used: Rolling walker (2 wheeled);None Transfers: Sit to/from Stand Sit to Stand: Supervision         General transfer comment: Supervision for safety sit<>stand with and without RW    Balance Overall balance assessment: Needs assistance Sitting-balance support: No upper extremity supported;Feet supported Sitting balance-Leahy Scale: Good     Standing balance support: Single extremity supported;Bilateral upper extremity supported;During functional activity Standing balance-Leahy Scale: Fair Standing balance comment: Dynamic stability improved with UE support                           ADL either performed or assessed with clinical judgement   ADL Overall ADL's : Needs assistance/impaired Eating/Feeding: Independent;Sitting   Grooming: Wash/dry face;Wash/dry  hands;Supervision/safety;Standing   Upper Body Bathing: Supervision/ safety   Lower Body Bathing: Supervison/ safety   Upper Body Dressing : Supervision/safety   Lower Body Dressing: Supervision/safety   Toilet Transfer: Ambulation;RW;Comfort height toilet;Grab bars   Toileting- Clothing Manipulation and Hygiene: Supervision/safety   Tub/ Shower Transfer: Supervision/safety;Ambulation;Rolling walker           Vision Baseline Vision/History: Wears glasses Patient Visual Report: No change from baseline       Perception     Praxis      Pertinent Vitals/Pain Pain Assessment: No/denies pain     Hand Dominance Right   Extremity/Trunk Assessment Upper Extremity Assessment Upper Extremity Assessment: Generalized weakness   Lower Extremity Assessment Lower Extremity Assessment: Defer to PT evaluation       Communication Communication Communication: HOH   Cognition Arousal/Alertness: Awake/alert Behavior During Therapy: WFL for tasks assessed/performed Overall Cognitive Status: Within Functional Limits for tasks assessed                                     General Comments       Exercises     Shoulder Instructions      Home Living Family/patient expects to be discharged to:: Assisted living                             Home Equipment: Walker - 4 wheels;Shower seat - built in;Grab bars - toilet;Grab bars - tub/shower;Hand held shower head   Additional  Comments: Pt lives in apartment at [place]      Prior Functioning/Environment Level of Independence: Independent with assistive device(s)        Comments: Mod indep with RW. All meals provided by independent living facility. Pt indep with ADLs        OT Problem List: Decreased activity tolerance;Impaired balance (sitting and/or standing)      OT Treatment/Interventions:      OT Goals(Current goals can be found in the care plan section) Acute  Rehab OT Goals Patient Stated Goal: Return home OT Goal Formulation: With patient  OT Frequency:     Barriers to D/C:    no barriers       Co-evaluation              AM-PAC PT "6 Clicks" Daily Activity     Outcome Measure Help from another person eating meals?: None Help from another person taking care of personal grooming?: A Little Help from another person toileting, which includes using toliet, bedpan, or urinal?: A Little Help from another person bathing (including washing, rinsing, drying)?: A Little Help from another person to put on and taking off regular upper body clothing?: None Help from another person to put on and taking off regular lower body clothing?: A Little 6 Click Score: 20   End of Session Equipment Utilized During Treatment: Gait belt;Rolling walker  Activity Tolerance: Patient tolerated treatment well Patient left: in chair  OT Visit Diagnosis: Muscle weakness (generalized) (M62.81)                Time: 9833-8250 OT Time Calculation (min): 32 min Charges:  OT General Charges $OT Visit: 1 Visit OT Evaluation $OT Eval Moderate Complexity: 1 Mod OT Treatments $Therapeutic Activity: 8-22 mins G-Codes: OT G-codes **NOT FOR INPATIENT CLASS** Functional Assessment Tool Used: AM-PAC 6 Clicks Daily Activity     Britt Bottom 01/12/2017, 2:30 PM

## 2017-01-12 NOTE — Progress Notes (Signed)
EKG done and informed MD (reason elevated troponin) Pt had a third episode of bloody stool at 5am, MD aware, RN asked to start IV fluids to MD, MD said will look at the chart and will decide, will continue to monitor the patient

## 2017-01-12 NOTE — Progress Notes (Addendum)
Pt has one episode of little blood in his BM, will continue to monitor

## 2017-01-12 NOTE — Evaluation (Addendum)
Physical Therapy Evaluation Patient Details Name: Evan [name] MRN: [id number] DOB: [birth year] Today's Date: 01/12/2017   History of Present Illness  Pt is a 84 y.o. male admitted 01/11/17 with worsening chest pain with exertion and GI bleed. Worked up for chest pain of cardiac nature vs. symptomatic anemia. Pt not a candidate for invasive procedures; being treated medically without anticoagulation. PMH includes CHF, CAD s/p CABG (1996), HTN, HLD, PAF, GERD, CKD III, GI bleed.   Clinical Impression  Pt presents with an overall decrease in functional mobility secondary to above. PTA, pt lives at [place] independent living where he is mod indep with RW and was working with physical therapy services. Today, pt able to transfer and ambulate using RW, requiring supervision for safety. Pt would benefit from continued acute PT services to maximize functional mobility and independence prior to d/c with HHPT services     Follow Up Recommendations Home health PT    Equipment Recommendations  None recommended by PT    Recommendations for Other Services       Precautions / Restrictions Precautions Precautions: Fall Restrictions Weight Bearing Restrictions: No      Mobility  Bed Mobility               General bed mobility comments: Received sitting in chair  Transfers Overall transfer level: Needs assistance Equipment used: Rolling walker (2 wheeled);None Transfers: Sit to/from Stand Sit to Stand: Supervision         General transfer comment: Supervision for safety sit<>stand with and without RW  Ambulation/Gait Ambulation/Gait assistance: Supervision Ambulation Distance (Feet): 150 Feet Assistive device: Rolling walker (2 wheeled);None Gait Pattern/deviations: Step-through pattern;Decreased stride length;Trunk flexed Gait velocity: Decreased Gait velocity interpretation: <1.8 ft/sec, indicative of risk for recurrent falls General Gait Details: Slow, controlled  ambulation with RW and supervision for safety; pt declining further distance secondary to fatigue. Amb in room with no AD and min guard for balance  Stairs            Wheelchair Mobility    Modified Rankin (Stroke Patients Only)       Balance Overall balance assessment: Needs assistance   Sitting balance-Leahy Scale: Fair       Standing balance-Leahy Scale: Fair Standing balance comment: Dynamic stability improved with UE support                             Pertinent Vitals/Pain Pain Assessment: No/denies pain    Home Living Family/patient expects to be discharged to:: Assisted living([facility])               Home Equipment: Gilford Rile - 4 wheels;Shower seat - built in;Grab bars - toilet;Grab bars - tub/shower;Hand held shower head Additional Comments: Pt lives in apartment at [facility]    Prior Function Level of Independence: Independent with assistive device(s)         Comments: Mod indep with RW. All meals provided by independent living facility. Pt indep with ADLs     Hand Dominance        Extremity/Trunk Assessment   Upper Extremity Assessment Upper Extremity Assessment: Generalized weakness    Lower Extremity Assessment Lower Extremity Assessment: Generalized weakness       Communication   Communication: HOH  Cognition Arousal/Alertness: Awake/alert Behavior During Therapy: WFL for tasks assessed/performed Overall Cognitive Status: Within Functional Limits for tasks assessed  General Comments      Exercises     Assessment/Plan    PT Assessment Patient needs continued PT services  PT Problem List Decreased strength;Decreased activity tolerance;Decreased balance;Decreased mobility       PT Treatment Interventions DME instruction;Gait training;Stair training;Functional mobility training;Therapeutic  activities;Therapeutic exercise;Balance training;Patient/family education    PT Goals (Current goals can be found in the Care Plan section)  Acute Rehab PT Goals Patient Stated Goal: Return home PT Goal Formulation: With patient Time For Goal Achievement: 01/26/17 Potential to Achieve Goals: Good    Frequency Min 3X/week   Barriers to discharge        Co-evaluation               AM-PAC PT "6 Clicks" Daily Activity  Outcome Measure Difficulty turning over in bed (including adjusting bedclothes, sheets and blankets)?: None Difficulty moving from lying on back to sitting on the side of the bed? : None Difficulty sitting down on and standing up from a chair with arms (e.g., wheelchair, bedside commode, etc,.)?: A Little Help needed moving to and from a bed to chair (including a wheelchair)?: A Little Help needed walking in hospital room?: A Little Help needed climbing 3-5 steps with a railing? : A Little 6 Click Score: 20    End of Session Equipment Utilized During Treatment: Gait belt Activity Tolerance: Patient tolerated treatment well Patient left: in chair;with call bell/phone within reach;with nursing/sitter in room Nurse Communication: Mobility status PT Visit Diagnosis: Other abnormalities of gait and mobility (R26.89)    Time: 1010-1036 PT Time Calculation (min) (ACUTE ONLY): 26 min   Charges:   PT Evaluation $PT Eval Moderate Complexity: 1 Mod PT Treatments $Gait Training: 8-22 mins   PT G Codes:       Mabeline Caras, PT, DPT Acute Rehab Services  Pager: [place] 01/12/2017, 11:13 AM

## 2017-01-12 NOTE — Progress Notes (Signed)
Shift events: Pt had 2nd episode of bloody stool at 0200. Hgb stable. BP 160s. Trop up now, but no chest pain. R/p EKG without acute changes over previous 2 on chart. Can not coagulate pt due to GIB. Is not a candidate for invasive procedures per cardio, so will have to treat medically without anticoagulation.  KJKG, NP [facility]

## 2017-01-12 NOTE — Progress Notes (Signed)
Pt's BP is 150/80 latest, as per order his all po medicines are in hold till am till the next order, just paged MD to double check with BP medicines, waiting for the response. Palma Holter, RN

## 2017-01-12 NOTE — Progress Notes (Signed)
Pt educated about safety and importance of bed alarm during the night however pt refuses to be on bed alarm. Will continue to round on patient.   Barabara Motz, RN    

## 2017-01-12 NOTE — Progress Notes (Signed)
PROGRESS NOTE    Evan [name]  TML:[id number] DOB: 1923/02/12 DOA: 01/11/2017 PCP: Deland Pretty, MD    Brief Narrative:  [phone] with hx of CHF, CAD s/p CABG, HTN who presented with chest pains with radiation to the arms with dark/melanotic stools that occurred several days prior to admit. Patient had similar event several months prior however declined further work up at that time. Troponin noted to be elevated and pt noted to have hgb of 7.4, given PRBC transfusion in ED. Cardiology consulted and [facility] consulted through ED  Assessment & Plan:   Active Problems:   Type 2 diabetes with nephropathy (HCC)   HYPERCHOLESTEROLEMIA   Iron deficiency anemia   Essential hypertension   CABG '96   GERD (gastroesophageal reflux disease)   Carotid bruit   Sick sinus syndrome (HCC)   PAF (paroxysmal atrial fibrillation) (HCC)   Syncope   CAD S/P SVG-MO1/OM2 DES 12/11/13   Pacemaker implanted 12/12/13 (MDT)   Stage 3 chronic renal impairment associated with type 2 diabetes mellitus (HCC)   NSTEMI (non-ST elevated myocardial infarction) ([place])   Atypical chest pain   BPH (benign prostatic hyperplasia)   Thyroid disease   Chest pain   Symptomatic anemia  Chest pain syndrome in a patient with known  -history of CAD, status post CABG x5, last catheterization in 2015, treated medically as well as a history of GI bleed.   -Cardiology consulted, recommendations noted for continued hold on anticoagulation -Currently asymptomatic -Continue to transfuse as needed. Repeat CBC in AM  Acute blood loss anemia -Patient is s/p blood transfusion in Ed -Hemodynamically stable at present -Repeat cbc in AM -[facility] consulted through ED, pending -Will keep on clear liquid diet for now  History of Complete Heart Block,  -s/p PPM.  -EKG with paed rhythm, no acute findings  -Cardiology following  History of paroxysmal atrial fibrillation,  -Currently stable -Anticoagulation on hold  History of  chronic systolic and diastolic heart failure,  -most recent 2D echo in July 2018 showing EF 25-30%. -TSH 1.61 -At this time continue Coreg and Entresto.  Hypertension    -Continue home anti-hypertensive medications  -Stable at present  Benign prostatic hypertrophy,  -no acute issues currently -Continue Proscar as tolerated  Hypothyroidism: -Continue home Synthroid -TSH within normal limits  Hyperlipidemia -Continue home statins   Headaches -Contine Depakote as tolerated  History of falls in the setting of neuropathy  -PT/OT consulted  DVT prophylaxis: SCD's Code Status: Full Family Communication: Pt in room, family not at bedside Disposition Plan: Uncertain at this time  Consultants:   Cardiology  [facility]  Procedures:     Antimicrobials: Anti-infectives (From admission, onward)   None       Subjective: Wanting to eat  Objective: Vitals:   01/12/17 0200 01/12/17 0500 01/12/17 1037 01/12/17 1147  BP: (!) 163/55 (!) 150/80 (!) 171/62 (!) 134/43  Pulse: 68 68 61 (!) 59  Resp: 18 18  18   Temp:  98 F (36.7 C)  98 F (36.7 C)  TempSrc:    Oral  SpO2: 100% 100%  98%  Weight:  68.6 kg (151 lb 3.2 oz)    Height:        Intake/Output Summary (Last 24 hours) at 01/12/2017 1538 Last data filed at 01/12/2017 1147 Gross per 24 hour  Intake 630 ml  Output 912 ml  Net -282 ml   Filed Weights   01/11/17 1135 01/11/17 2009 01/12/17 0500  Weight: 68.9 kg (152 lb)  69.4 kg (153 lb 1.6 oz) 68.6 kg (151 lb 3.2 oz)    Examination: General exam: Awake, laying in bed, in nad Respiratory system: Normal respiratory effort, no wheezing Cardiovascular system: regular rate, s1, s2 Gastrointestinal system: Soft, nondistended, positive BS Central nervous system: CN2-12 grossly intact, strength intact Extremities: Perfused, no clubbing Skin: Normal skin turgor, no notable skin lesions seen Psychiatry: Mood normal // no visual hallucinations    Data Reviewed: I  have personally reviewed following labs and imaging studies  CBC: Recent Labs  Lab 01/11/17 1202 01/11/17 2336 01/12/17 0000 01/12/17 0317 01/12/17 0810  WBC 6.4  --  6.8 7.1  --   HGB 7.4* 9.5* 9.5* 9.4* 10.1*  HCT 24.5* 30.1* 30.5* 28.8* 31.5*  MCV 86.9  --  85.4 85.2  --   PLT 165  --  155 141*  --    Basic Metabolic Panel: Recent Labs  Lab 01/11/17 1202 01/12/17 0000  NA 139 143  K 3.9 3.7  CL 107 108  CO2 22 23  GLUCOSE 195* 125*  BUN 32* 32*  CREATININE 1.58* 1.55*  CALCIUM 8.7* 8.4*   GFR: Estimated Creatinine Clearance: 29 mL/min (A) (by C-G formula based on SCr of 1.55 mg/dL (H)). Liver Function Tests: Recent Labs  Lab 01/12/17 0000  AST 20  ALT 10*  ALKPHOS 55  BILITOT 1.3*  PROT 5.8*  ALBUMIN 3.2*   No results for input(s): LIPASE, AMYLASE in the last 168 hours. No results for input(s): AMMONIA in the last 168 hours. Coagulation Profile: Recent Labs  Lab 01/12/17 0000  INR 1.20   Cardiac Enzymes: Recent Labs  Lab 01/11/17 1654 01/11/17 1908 01/11/17 2336  TROPONINI 0.51* 1.02* 3.05*   BNP (last 3 results) No results for input(s): PROBNP in the last 8760 hours. HbA1C: Recent Labs    01/11/17 1651  HGBA1C 6.9*   CBG: Recent Labs  Lab 01/11/17 1918 01/11/17 2343 01/12/17 0732 01/12/17 1144  GLUCAP 113* 119* 128* 168*   Lipid Profile: No results for input(s): CHOL, HDL, LDLCALC, TRIG, CHOLHDL, LDLDIRECT in the last 72 hours. Thyroid Function Tests: Recent Labs    01/11/17 1645  TSH 1.610   Anemia Panel: Recent Labs    01/11/17 1645 01/11/17 1649  VITAMINB12 372  --   FOLATE  --  15.9  FERRITIN 16*  --   TIBC 337  --   IRON 23*  --   RETICCTPCT 2.2  --    Sepsis Labs: No results for input(s): PROCALCITON, LATICACIDVEN in the last 168 hours.  No results found for this or any previous visit (from the past 240 hour(s)).   Radiology Studies: Dg Chest 2 View  Result Date: 01/11/2017 CLINICAL DATA:  Chest pain.  EXAM: CHEST  2 VIEW COMPARISON:  Chest x-ray dated August 02, 2016. FINDINGS: Unchanged left chest wall pacer device with leads terminating in the right atrium and right ventricle. Postsurgical changes related to prior CABG. Normal heart size. Atherosclerotic calcification of the aortic arch. Normal pulmonary vascularity. No focal consolidation, pleural effusion, or pneumothorax. No acute osseous abnormality. IMPRESSION: No active cardiopulmonary disease. Electronically Signed   By: Titus Dubin M.D.   On: 01/11/2017 12:47    Scheduled Meds: . amiodarone  100 mg Oral Daily  . atorvastatin  10 mg Oral QHS  . carvedilol  12.5 mg Oral BID WC  . divalproex  250 mg Oral QHS  . ferrous sulfate  325 mg Oral BID  . finasteride  5 mg  Oral Daily  . folic acid  352 mcg Oral Once per day on Mon Wed Fri  . furosemide  20 mg Intravenous Once  . furosemide  80 mg Oral Daily  . insulin aspart  0-9 Units Subcutaneous TID WC  . isosorbide mononitrate  30 mg Oral Daily  . lactobacillus acidophilus  1 tablet Oral BID  . levothyroxine  112 mcg Oral QAC breakfast  . sacubitril-valsartan  1 tablet Oral BID   Continuous Infusions: . sodium chloride 50 mL/hr at 01/12/17 1040     LOS: 1 day   Marylu Lund, MD [facility] Pager [phone]  If 7PM-7AM, please contact night-coverage [email or web address] Password [facility] 01/12/2017, 3:38 PM

## 2017-01-13 DIAGNOSIS — R7989 Other specified abnormal findings of blood chemistry: Secondary | ICD-10-CM

## 2017-01-13 DIAGNOSIS — R748 Abnormal levels of other serum enzymes: Secondary | ICD-10-CM

## 2017-01-13 DIAGNOSIS — N183 Chronic kidney disease, stage 3 unspecified: Secondary | ICD-10-CM

## 2017-01-13 DIAGNOSIS — D649 Anemia, unspecified: Secondary | ICD-10-CM

## 2017-01-13 DIAGNOSIS — D696 Thrombocytopenia, unspecified: Secondary | ICD-10-CM

## 2017-01-13 DIAGNOSIS — R778 Other specified abnormalities of plasma proteins: Secondary | ICD-10-CM

## 2017-01-13 DIAGNOSIS — E1121 Type 2 diabetes mellitus with diabetic nephropathy: Secondary | ICD-10-CM

## 2017-01-13 LAB — GLUCOSE, CAPILLARY
GLUCOSE-CAPILLARY: 131 mg/dL — AB (ref 65–99)
GLUCOSE-CAPILLARY: 156 mg/dL — AB (ref 65–99)
Glucose-Capillary: 123 mg/dL — ABNORMAL HIGH (ref 65–99)
Glucose-Capillary: 170 mg/dL — ABNORMAL HIGH (ref 65–99)

## 2017-01-13 LAB — CBC
HEMATOCRIT: 27.5 % — AB (ref 39.0–52.0)
Hemoglobin: 8.5 g/dL — ABNORMAL LOW (ref 13.0–17.0)
MCH: 26.6 pg (ref 26.0–34.0)
MCHC: 30.9 g/dL (ref 30.0–36.0)
MCV: 86.2 fL (ref 78.0–100.0)
PLATELETS: 136 10*3/uL — AB (ref 150–400)
RBC: 3.19 MIL/uL — ABNORMAL LOW (ref 4.22–5.81)
RDW: 16.7 % — AB (ref 11.5–15.5)
WBC: 4.6 10*3/uL (ref 4.0–10.5)

## 2017-01-13 LAB — BASIC METABOLIC PANEL
Anion gap: 10 (ref 5–15)
BUN: 29 mg/dL — AB (ref 6–20)
CALCIUM: 7.8 mg/dL — AB (ref 8.9–10.3)
CO2: 23 mmol/L (ref 22–32)
Chloride: 109 mmol/L (ref 101–111)
Creatinine, Ser: 1.41 mg/dL — ABNORMAL HIGH (ref 0.61–1.24)
GFR calc Af Amer: 49 mL/min — ABNORMAL LOW (ref >60)
GFR, EST NON AFRICAN AMERICAN: 42 mL/min — AB (ref >60)
GLUCOSE: 128 mg/dL — AB (ref 65–99)
Potassium: 3.3 mmol/L — ABNORMAL LOW (ref 3.5–5.1)
SODIUM: 142 mmol/L (ref 135–145)

## 2017-01-13 MED ORDER — FOLIC ACID 1 MG PO TABS
1.0000 mg | ORAL_TABLET | ORAL | Status: DC
  Administered 2017-01-13: 1 mg via ORAL
  Filled 2017-01-13: qty 1

## 2017-01-13 NOTE — Progress Notes (Signed)
Pt educated about safety and importance of bed alarm during the night however pt refuses to be on bed alarm. Will continue to round on patient.   Kayshaun Polanco, RN    

## 2017-01-13 NOTE — Care Management Note (Addendum)
Case Management Note  Patient Details  Name: Evan [name] MRN: [id number] Date of Birth: Feb 07, 1923  Subjective/Objective:    Admitted with               symptomatic anemia, chest pain   Action/Plan: In to speak with patient, prior to admission patient lived at [facility] living. In to discuss recommendation for [facility] PT.  Patient stated he is active with [facility] [phone], Fax: [phone], already receiving PT/OT.  Agreed to resume orders at discharge.  Home DME: walker-4 wheels, shower seat-built in, Tub/shower-Grab bars. NCM will continue to follow for discharge transition.   Expected Discharge Date:   01/14/1017               Expected Discharge Plan:  Assisted Living / Rest Home  Discharge planning Services  CM Consult  HH Arranged:  PT, OT [facility] Agency:  Other - See comment(Resume orders)  Status of Service:  In process, will continue to follow  Kristen Cardinal, RN  Case Manager-Orientation [phone] 01/13/2017, 2:45 PM

## 2017-01-13 NOTE — Consult Note (Signed)
Referring Provider: Dr. Murray Hodgkins ([facility]) Primary Care Physician:  Deland Pretty, MD Primary Gastroenterologist:  Dr. Wonda Horner  Reason for Consultation: Anemia, hematochezia  HPI: RAMARI BRAY is a 84 y.o. male admitted to the hospital a couple of days ago following hematochezia similar to that which prompted hospitalization 6 months ago.  This was associated with a 2 g drop in hemoglobin, superimposed on chronic anemia.  He has been transfused back to hemoglobin of 8.5.  He has been without bleeding for a couple of days, have a small normal appearing bowel movement today.    Previous evaluation by Dr. Paulita Fujita included endoscopy with duodenal biopsies because of chronic iron deficiency anemia, and colonoscopy which showed pancolonic diverticulosis and a small adenomatous polyp.  That was 7 years ago.  Consideration was given to a capsule endoscopy, but that was never performed, probably because the patient's hemoglobin came up to 13 on iron supplementation at that time.    He had a subsequent GI bleed characterized by medication 2013, as well as the one 6 months ago.    The patient is not on any ulcerogenic medications as an outpatient.    He has a pacemaker, is on Plavix, amiodarone, and Lasix.  Ejection fraction by echo 6 months ago was about 25-30%.   Past Medical History:  Diagnosis Date  . AC joint dislocation   . Allergic rhinitis   . Arthritis   . Atrial tachycardia ([place])   . BPH (benign prostatic hyperplasia)   . CAD   . CHF (congestive heart failure) ([place])   . Diarrhea   . DM    type 2  . GERD (gastroesophageal reflux disease)   . H/O: GI bleed   . Heart murmur   . HYPERCHOLESTEROLEMIA   . HYPERTENSION   . Hypothyroidism   . Iron deficiency anemia   . NEPHROLITHIASIS, HX OF   . Paroxysmal atrial fibrillation (HCC)   . Presence of permanent cardiac pacemaker 12/12/2013  . Rib fracture   . Stable angina (HCC)   . Syncope   . Thyroid disease    . TRANSIENT ISCHEMIC ATTACKS, HX OF   . 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 Urinary frequency   . Vertigo   . Weakness of left leg     Past Surgical History:  Procedure Laterality Date  . AV FISTULA REPAIR    . BACK SURGERY  2000'S  . CARDIAC CATHETERIZATION  12/11/2013   Procedure: CORONARY STENT INTERVENTION;  Surgeon: Burnell Blanks, MD;  Location: [facility] CATH LAB;  Service: Cardiovascular;;  DES 3.5 x 15 Resolute to seq SVG of OM1/OM2   . CORONARY ARTERY BYPASS GRAFT  1996  . CORONARY STENT PLACEMENT  12/11/13   SVG-OM DES  . FEMORAL ARTERY ANEURYSM REPAIR     feroral pseudo-aneurysm repair  . INSERT / REPLACE / REMOVE PACEMAKER    . LEFT HEART CATHETERIZATION WITH CORONARY/GRAFT ANGIOGRAM N/A 12/11/2013   Procedure: LEFT HEART CATHETERIZATION WITH Beatrix Fetters;  Surgeon: Burnell Blanks, MD;  Location: [facility] CATH LAB;  Service: Cardiovascular;  Laterality: N/A;  . PERMANENT PACEMAKER INSERTION N/A 12/12/2013   MDT ADDRL1 pacemaker implnated by Dr Rayann Heman  . PTCA  1980's    Prior to Admission medications   Medication Sig Start Date End Date Taking? Authorizing Provider  acetaminophen (TYLENOL) 325 MG tablet Take 650 mg by mouth every 6 (six) hours as needed for mild pain.   Yes  [provider]  amiodarone (PACERONE) 100 MG tablet TAKE 1 TABLET BY MOUTH EVERY DAY 08/26/16  Yes [name], MD  atorvastatin (LIPITOR) 10 MG tablet Take 1 tablet (10 mg total) by mouth daily. 08/24/16  Yes Medina-Vargas, Monina C, NP  benzonatate (TESSALON) 200 MG capsule Take 1 capsule (200 mg total) by mouth 3 (three) times daily as needed for cough. 08/24/16  Yes Medina-Vargas, Monina C, NP  carvedilol (COREG) 12.5 MG tablet Take 1 tablet (12.5 mg total) by mouth 2 (two) times daily with a meal. 08/24/16  Yes Medina-Vargas, Monina C, NP  clopidogrel (PLAVIX) 75 MG tablet TAKE 1 TABLET BY MOUTH EVERY DAY WITH BREAKFAST 08/26/16  Yes [name], MD  Cobalamine Combinations (VITAMIN B12-FOLIC ACID PO) Take 1 tablet by mouth every Monday, Wednesday, and Friday.   Yes [provider]  divalproex (DEPAKOTE ER) 250 MG 24 hr tablet Take 1 tablet (250 mg total) by mouth at bedtime. 08/24/16  Yes Medina-Vargas, Monina C, NP  divalproex (DEPAKOTE ER) 250 MG 24 hr tablet Take 250 mg by mouth at bedtime.   Yes [provider]  ENTRESTO 24-26 MG TAKE 1 TABLET BY MOUTH 2 TIMES DAILY 08/26/16  Yes [name], MD  ferrous sulfate 324 (65 Fe) MG TBEC Take 1 tablet by mouth 2 (two) times daily.   Yes [provider]  finasteride (PROSCAR) 5 MG tablet Take 1 tablet (5 mg total) by mouth daily. 08/24/16  Yes Medina-Vargas, Monina C, NP  folic acid (FOLVITE) 470 MCG tablet Take 400 mcg by mouth 3 (three) times a week. M, Wed,Fri   Yes [provider]  furosemide (LASIX) 80 MG tablet Take 1 1/2 tablet by mouth daily for 3 days then go back to 1 tablet by mouth daily 11/17/16  Yes Lenze, Jennye Moccasin, PA-C  isosorbide mononitrate (IMDUR) 30 MG 24 hr tablet Take 1 tablet (30 mg total) by mouth daily. 08/24/16  Yes Medina-Vargas, Monina C, NP  levothyroxine (SYNTHROID, LEVOTHROID) 112 MCG tablet Take 1 tablet (112 mcg total) by mouth daily before breakfast. 08/24/16  Yes Medina-Vargas, Monina C, NP  MELATONIN PO Take 10 mg by mouth at bedtime as needed.   Yes [provider]  nitroGLYCERIN (NITROSTAT) 0.4 MG SL tablet PLACE 1 TABLET (0.4 MG TOTAL) UNDER THE TONGUE EVERY 5 (FIVE) MINUTES AS NEEDED FOR CHEST PAIN. 08/24/16  Yes Medina-Vargas, Monina C, NP  saccharomyces boulardii (FLORASTOR) 250 MG capsule Take 1 capsule (250 mg total) by mouth 2 (two) times daily. 08/24/16  Yes Medina-Vargas, Monina C, NP  sitaGLIPtin (JANUVIA) 25 MG tablet Take 1 tablet (25 mg total) by mouth daily. Take one tablet by mouth once daily to control blood sugar 08/24/16  Yes Medina-Vargas, Monina C, NP    Current Facility-Administered  Medications  Medication Dose Route Frequency Provider Last Rate Last Dose  . acetaminophen (TYLENOL) tablet 650 mg  650 mg Oral Q6H PRN Sharene Butters E, PA-C       Or  . acetaminophen (TYLENOL) suppository 650 mg  650 mg Rectal Q6H PRN Rondel Jumbo, PA-C      . ALPRAZolam Duanne Moron) tablet 0.25 mg  0.25 mg Oral BID PRN Rondel Jumbo, PA-C      . amiodarone (PACERONE) tablet 100 mg  100 mg Oral Daily Rondel Jumbo, PA-C   100 mg at 01/13/17 9628  . atorvastatin (LIPITOR) tablet 10 mg  10 mg Oral QHS Rondel Jumbo, PA-C  10 mg at 01/12/17 2202  . benzonatate (TESSALON) capsule 200 mg  200 mg Oral TID PRN Rondel Jumbo, PA-C      . bisacodyl (DULCOLAX) suppository 10 mg  10 mg Rectal Daily PRN Rondel Jumbo, PA-C      . carvedilol (COREG) tablet 12.5 mg  12.5 mg Oral BID WC Sharene Butters E, PA-C   12.5 mg at 01/13/17 1650  . divalproex (DEPAKOTE ER) 24 hr tablet 250 mg  250 mg Oral QHS Rondel Jumbo, PA-C   250 mg at 01/12/17 2202  . ferrous sulfate tablet 325 mg  325 mg Oral BID Rondel Jumbo, PA-C   325 mg at 01/13/17 8119  . finasteride (PROSCAR) tablet 5 mg  5 mg Oral Daily Rondel Jumbo, PA-C   5 mg at 01/13/17 0825  . folic acid (FOLVITE) tablet 1 mg  1 mg Oral Once per day on Mon Wed Fri Donne Hazel, MD   1 mg at 01/13/17 1478  . furosemide (LASIX) injection 20 mg  20 mg Intravenous Once Wertman, Sara E, PA-C      . furosemide (LASIX) tablet 80 mg  80 mg Oral Daily Rondel Jumbo, PA-C   80 mg at 01/13/17 0825  . gi cocktail (Maalox,Lidocaine,Donnatal)  30 mL Oral QID PRN Rondel Jumbo, PA-C      . insulin aspart (novoLOG) injection 0-9 Units  0-9 Units Subcutaneous TID WC Rondel Jumbo, PA-C   1 Units at 01/13/17 1803  . isosorbide mononitrate (IMDUR) 24 hr tablet 30 mg  30 mg Oral Daily Rondel Jumbo, PA-C   30 mg at 01/13/17 0825  . lactobacillus acidophilus (BACID) tablet 1 tablet  1 tablet Oral BID Rondel Jumbo, PA-C   1 tablet at 01/13/17 0825  .  levothyroxine (SYNTHROID, LEVOTHROID) tablet 112 mcg  112 mcg Oral QAC breakfast Rondel Jumbo, PA-C   112 mcg at 01/13/17 0555  . Melatonin TABS 3 mg  3 mg Oral QHS PRN Rondel Jumbo, PA-C      . ondansetron [facility]) injection 4 mg  4 mg Intravenous Q6H PRN Rondel Jumbo, PA-C      . sacubitril-valsartan (ENTRESTO) 24-26 mg per tablet  1 tablet Oral BID Rondel Jumbo, PA-C   1 tablet at 01/13/17 0825  . senna-docusate (Senokot-S) tablet 1 tablet  1 tablet Oral QHS PRN Rondel Jumbo, PA-C        Allergies as of 01/11/2017 - Review Complete 01/11/2017  Allergen Reaction Noted  . Clonidine derivatives  04/13/2016  . Codeine  04/13/2016  . Meperidine hcl    . Motrin [ibuprofen]  04/13/2016  . Other  04/13/2016  . Penicillins  04/13/2016  . Demerol Other (See Comments) 01/09/2011  . Heparin Other (See Comments) 05/14/2014  . Metformin and related Diarrhea 05/14/2014    Family History  Problem Relation Age of Onset  . Diabetes Mother   . Coronary artery disease Father     Social History   Socioeconomic History  . Marital status: Widowed    Spouse name: Not on file  . Number of children: Not on file  . Years of education: Not on file  . Highest education level: Not on file  Social Needs  . Financial resource strain: Not on file  . Food insecurity - worry: Not on file  . Food insecurity - inability: Not on file  . Transportation needs - medical: Not on file  .  Transportation needs - non-medical: Not on file  Occupational History  . Not on file  Tobacco Use  . Smoking status: Former Smoker    Last attempt to quit: 01/06/1956    Years since quitting: 61.0  . Smokeless tobacco: Never Used  Substance and Sexual Activity  . Alcohol use: No    Alcohol/week: 0.0 oz  . Drug use: No  . Sexual activity: Not Currently  Other Topics Concern  . Not on file  Social History Narrative   Pt is married and lives with his wife.  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      Review of Systems: The patient indicates he has fairly frequent stools.  He noticed some "rust" colored stools about a week ago and began to feel weaker than normal, but did not not have frank hematochezia until the time of admission.  Physical Exam: Vital signs in last 24 hours: Temp:  [98 F (36.7 C)-98.8 F (37.1 C)] 98 F (36.7 C) (01/09 1205) Pulse Rate:  [60-62] 61 (01/09 1205) Resp:  [18-20] 20 (01/09 1205) BP: (122-154)/(42-46) 124/46 (01/09 1205) SpO2:  [96 %-98 %] 96 % (01/09 1205) Weight:  [69.5 kg (153 lb 3.2 oz)] 69.5 kg (153 lb 3.2 oz) (01/09 0543) Last BM Date: 01/12/17  Pleasant, elderly Caucasian male sitting up on the side of the bed to eat his clear liquid supper, absolutely no distress, cognitively intact, no overt focal neurologic deficits, actinic changes on the skin, which is somewhat pale.  Chest is clear, no rales or wheezes.  Heart has distant sounds, no murmur or arrhythmia appreciated.  Abdomen grossly nontender.   lower extremities have 2-3+ pitting edema.  Intake/Output from previous day: 01/08 0701 - 01/09 0700 In: 1083.3 [P.O.:120; I.V.:963.3] Out: 451 [Urine:450; Stool:1] Intake/Output this shift: Total I/O In: 360 [P.O.:360] Out: 150 [Urine:150]  Lab Results: Recent Labs    01/12/17 0000 01/12/17 0317 01/12/17 0810 01/13/17 0653  WBC 6.8 7.1  --  4.6  HGB 9.5* 9.4* 10.1* 8.5*  HCT 30.5* 28.8* 31.5* 27.5*  PLT 155 141*  --  136*   BMET Recent Labs    01/11/17 1202 01/12/17 0000 01/13/17 0653  NA 139 143 142  K 3.9 3.7 3.3*  CL 107 108 109  CO2 22 23 23   GLUCOSE 195* 125* 128*  BUN 32* 32* 29*  CREATININE 1.58* 1.55* 1.41*  CALCIUM 8.7* 8.4* 7.8*   LFT Recent Labs    01/12/17 0000  PROT 5.8*  ALBUMIN 3.2*  AST 20  ALT 10*  ALKPHOS 55  BILITOT 1.3*   PT/INR Recent Labs    01/12/17 0000  LABPROT 15.1  INR 1.20    Studies/Results: No results found.  Impression: 1.   Acute on chronic anemia associated with hematochezia 2.  Prior history of GI bleeding as well as chronic iron deficiency anemia, approximately 7 years since most recent endoscopy and colonoscopy  Plan: While in the hospital, I would favor observation only.  As long as the patient does not have persistent or destabilizing bleeding, I would not favor endoscopic or colonoscopic evaluation.  However, I do feel that, after discharge, the patient should have outpatient follow-up with his primary gastroenterologist, Dr. Paulita Fujita.  If the patient remains Hemoccult positive and/or if his hemoglobin does not respond appropriately to iron supplementation, consideration could be given to perhaps an endoscopic workup or a limited lower GI study or a capsule  endoscopy.  Will follow with you while in-house.   LOS: 2 days   Marcquis Ridlon V  01/13/2017, 6:29 PM   Pager [phone] If no answer or after 5 PM call [phone]

## 2017-01-13 NOTE — Progress Notes (Signed)
Patient with one bowel movement last night. No bleeding noted or reported.  Davelle Anselmi, RN

## 2017-01-13 NOTE — Progress Notes (Signed)
Physical Therapy Treatment Patient Details Name: Evan [name] MRN: [id number] DOB: 01-14-1923 Today's Date: 01/13/2017    History of Present Illness Pt is a 84 y.o. male admitted 01/11/17 with worsening chest pain with exertion and GI bleed. Worked up for chest pain of cardiac nature vs. symptomatic anemia. Pt not a candidate for invasive procedures; being treated medically without anticoagulation. PMH includes CHF, CAD s/p CABG (1996), HTN, HLD, PAF, GERD, CKD III, GI bleed.   PT Comments    Pt progressing well with mobility. Able to ambulate 250' with RW, requiring 1x seated rest break secondary to fatigue. Continues to benefit from supervision due to decreased stability and activity tolerance. Daughter present throughout session and very supportive. Pt remains motivated to participate. Will continue to follow acutely.   Follow Up Recommendations  Home health PT     Equipment Recommendations  None recommended by PT    Recommendations for Other Services       Precautions / Restrictions Precautions Precautions: Fall Restrictions Weight Bearing Restrictions: No    Mobility  Bed Mobility Overal bed mobility: Independent                Transfers Overall transfer level: Needs assistance Equipment used: Rolling walker (2 wheeled);None Transfers: Sit to/from Stand Sit to Stand: Supervision         General transfer comment: Supervision for safety sit<>stand with and without RW  Ambulation/Gait Ambulation/Gait assistance: Supervision Ambulation Distance (Feet): 250 Feet Assistive device: Rolling walker (2 wheeled) Gait Pattern/deviations: Step-through pattern;Decreased stride length;Trunk flexed Gait velocity: Decreased Gait velocity interpretation: <1.8 ft/sec, indicative of risk for recurrent falls General Gait Details: Slow, controlled ambulation with RW, requiring 1x seated rest break due to fatigue. Amb in room with min guard for safety/balance with pt reaching  out to furniture for UE support   Stairs            Wheelchair Mobility    Modified Rankin (Stroke Patients Only)       Balance Overall balance assessment: Needs assistance Sitting-balance support: No upper extremity supported;Feet supported Sitting balance-Leahy Scale: Good     Standing balance support: Single extremity supported;Bilateral upper extremity supported;During functional activity Standing balance-Leahy Scale: Fair Standing balance comment: Dynamic stability improved with UE support                            Cognition Arousal/Alertness: Awake/alert Behavior During Therapy: WFL for tasks assessed/performed Overall Cognitive Status: Within Functional Limits for tasks assessed                                        Exercises      General Comments General comments (skin integrity, edema, etc.): Daughter present during session      Pertinent Vitals/Pain Pain Assessment: No/denies pain    Home Living                      Prior Function            PT Goals (current goals can now be found in the care plan section) Acute Rehab PT Goals Patient Stated Goal: Return home PT Goal Formulation: With patient Time For Goal Achievement: 01/26/17 Potential to Achieve Goals: Good Progress towards PT goals: Progressing toward goals    Frequency    Min 3X/week      PT  Plan Current plan remains appropriate    Co-evaluation              AM-PAC PT "6 Clicks" Daily Activity  Outcome Measure  Difficulty turning over in bed (including adjusting bedclothes, sheets and blankets)?: None Difficulty moving from lying on back to sitting on the side of the bed? : None Difficulty sitting down on and standing up from a chair with arms (e.g., wheelchair, bedside commode, etc,.)?: A Little Help needed moving to and from a bed to chair (including a wheelchair)?: A Little Help needed walking in hospital room?: A Little Help  needed climbing 3-5 steps with a railing? : A Little 6 Click Score: 20    End of Session Equipment Utilized During Treatment: Gait belt Activity Tolerance: Patient tolerated treatment well Patient left: in chair;with call bell/phone within reach;with family/visitor present Nurse Communication: Mobility status PT Visit Diagnosis: Other abnormalities of gait and mobility (R26.89)     Time: 9373-4287 PT Time Calculation (min) (ACUTE ONLY): 23 min  Charges:  $Gait Training: 23-37 mins                    G Codes:      Mabeline Caras, PT, DPT Acute Rehab Services  Pager: [place] 01/13/2017, 5:25 PM

## 2017-01-13 NOTE — Progress Notes (Signed)
PROGRESS NOTE  Evan [name] NTI:[id number] DOB: Oct 10, 1923 DOA: 01/11/2017 PCP: Deland Pretty, MD  Brief Narrative: 43yom PMH CAD, CABG, GIB, PAF not on anticoagulation prsented with CP, SOB, dark blood in stools. Admitted for symptomatic anemia, chest pain evaluation, GI evaluation.  Assessment/Plan Symptomatic anemia, ABLA secondary to GIB; superimposed on chronic normocytic anemia,  s/p transfusion 2 units PRBC - last admit for GIB 06/2016 at that time elevated troponin precluded endoscopy - off Plavix since 06/2016 - Hgb up to 9 with PRBC but trending back down. No bleeding since 1/8.  - GI consult was attempted in ED but no callback noted. Consult placed today. Given elevated troponin not sure any further testing will be offered - CBC in AM  Elevated troponin. Cardiology rec no Plavix. Troponin up to 3.05 1/7. - asymptomatic, continue BB. No ASA secondary to bleed.  Thrombocytopenia - likely secondary to blood loss; follow with CBC in AM  Chronic systolic/diastolic CHF, LVEF 86-76%, CAD, s/p CABG 1996 - appears compensated; continue statin, BB, Entresto, Lasix   PAF, complete heart block s/p PPM. Not anticoag candidate secondary to GIB and falls - stable; continue amiodarone, carvedilol  DM type 2 - continue SSI, resume sitagliptin on discharge - CBG stable  CKD stage III, at baseline  DVT prophylaxis: SCDs Code Status: full Family Communication: none Disposition Plan: Home with HHPT 1/10 if CBC stable and cleared by GI   Murray Hodgkins, MD  [facility] Direct contact: [phone] --Via amion app OR  --[email or web address]; password [email or web address]  7PM-7AM contact night coverage as above 01/13/2017, 12:51 PM  LOS: 2 days   Consultants:  Cardiology   GI  Procedures:  2 units PRBC 1/7-1/8  Antimicrobials:    Interval history/Subjective: RN charted blood with BM 1/8 twice   No bleeding, no pain, no complaints.  Objective: Vitals:  Vitals:   01/13/17 0543  01/13/17 1205  BP: (!) 122/43 (!) 124/46  Pulse: 62 61  Resp: 18 20  Temp: 98.2 F (36.8 C) 98 F (36.7 C)  SpO2: 98% 96%    Exam:  Constitutional:  . Appears calm and comfortable Eyes:  . pupils and irises appear normal . Normal lids   ENMT:  . grossly normal hearing  . Lips appear normal Respiratory:  . CTA bilaterally, no w/r/r.  . Respiratory effort normal.   Cardiovascular:  . RRR, no m/r/g . No LE extremity edema   Abdomen:  . Abdomen appears normal; no tenderness or masses Psychiatric:  . Mental status o Mood, affect appropriate  I have personally reviewed the following:  Labs:  Hgb 9.4 >> 10.1 >> 8.5  Plts 141 >. 136  Troponin 3.05 on 1/7  Scheduled Meds: . amiodarone  100 mg Oral Daily  . atorvastatin  10 mg Oral QHS  . carvedilol  12.5 mg Oral BID WC  . divalproex  250 mg Oral QHS  . ferrous sulfate  325 mg Oral BID  . finasteride  5 mg Oral Daily  . folic acid  1 mg Oral Once per day on Mon Wed Fri  . furosemide  20 mg Intravenous Once  . furosemide  80 mg Oral Daily  . insulin aspart  0-9 Units Subcutaneous TID WC  . isosorbide mononitrate  30 mg Oral Daily  . lactobacillus acidophilus  1 tablet Oral BID  . levothyroxine  112 mcg Oral QAC breakfast  . sacubitril-valsartan  1 tablet Oral BID   Continuous Infusions:   Principal Problem:   Symptomatic anemia  Active Problems:   Type 2 diabetes with nephropathy (HCC)   HYPERCHOLESTEROLEMIA   Iron deficiency anemia   CABG '96   GERD (gastroesophageal reflux disease)   Sick sinus syndrome (HCC)   PAF (paroxysmal atrial fibrillation) ([place])   CAD S/P SVG-MO1/OM2 DES 12/11/13   Pacemaker implanted 12/12/13 (MDT)   Stage 3 chronic renal impairment associated with type 2 diabetes mellitus (HCC)   Chest pain   Elevated troponin   Thrombocytopenia (HCC)   CKD (chronic kidney disease), stage III ([place])   LOS: 2 days    Time 45 minutes >50% counseling discussion cardiology recs. Bleeding  history, anemia; discussion with Dr Wallis Mart.

## 2017-01-14 DIAGNOSIS — I509 Heart failure, unspecified: Secondary | ICD-10-CM

## 2017-01-14 DIAGNOSIS — I5042 Chronic combined systolic (congestive) and diastolic (congestive) heart failure: Secondary | ICD-10-CM

## 2017-01-14 DIAGNOSIS — R7989 Other specified abnormal findings of blood chemistry: Secondary | ICD-10-CM

## 2017-01-14 LAB — CBC
HCT: 28.2 % — ABNORMAL LOW (ref 39.0–52.0)
HEMOGLOBIN: 8.8 g/dL — AB (ref 13.0–17.0)
MCH: 27 pg (ref 26.0–34.0)
MCHC: 31.2 g/dL (ref 30.0–36.0)
MCV: 86.5 fL (ref 78.0–100.0)
PLATELETS: 145 10*3/uL — AB (ref 150–400)
RBC: 3.26 MIL/uL — AB (ref 4.22–5.81)
RDW: 16.5 % — ABNORMAL HIGH (ref 11.5–15.5)
WBC: 4 10*3/uL (ref 4.0–10.5)

## 2017-01-14 LAB — GLUCOSE, CAPILLARY
Glucose-Capillary: 107 mg/dL — ABNORMAL HIGH (ref 65–99)
Glucose-Capillary: 161 mg/dL — ABNORMAL HIGH (ref 65–99)

## 2017-01-14 NOTE — Progress Notes (Addendum)
Pt feels ok.  BM this morning, loose but did not appear bloody to pt's observation.  Hgb stable x24hrs, 8.5 -> 8.8.  Plan for dischg noted.  IMPR:  Suspect this was a mild diverticular bleed which has stopped.  PLAN:    1. Have advised Dr. Paulita Fujita of need to arrange outpt f/u for pt to monitor for ongoing bld loss or persistent anemia, altho given pt's age and low EF of 25-30%, I doubt that he's a candidate for endoscopic studies.    2. Have also advised pt to contact our office, if he hasn't heard from [place] in the next 2 wks, to arrange appt.  Have given him our card and phone number for that purpose.   [place] for regular diet, no new meds needed from our standpoint.  Cleotis Nipper, M.D. Pager [phone] If no answer or after 5 PM call [phone]

## 2017-01-14 NOTE — Progress Notes (Signed)
Physical Therapy Treatment Patient Details Name: Evan [name] MRN: [id number] DOB: 1923/11/06 Today's Date: 01/14/2017    History of Present Illness Pt is a 84 y.o. male admitted 01/11/17 with worsening chest pain with exertion and GI bleed. Worked up for chest pain of cardiac nature vs. symptomatic anemia. Pt not a candidate for invasive procedures; being treated medically without anticoagulation. PMH includes CHF, CAD s/p CABG (1996), HTN, HLD, PAF, GERD, CKD III, GI bleed.    PT Comments    Pt is up to walk with PT and noted his relative instability but is going to be seen for HHPT to follow up.  He is willing to work and progress his efforts to match the demands, but will need supervised therapy to make this transition.  See acutely if DC is not done today as planned.     Follow Up Recommendations  Home health PT     Equipment Recommendations  None recommended by PT    Recommendations for Other Services       Precautions / Restrictions Precautions Precautions: Fall Precaution Comments: telemetry Restrictions Weight Bearing Restrictions: No    Mobility  Bed Mobility Overal bed mobility: Independent                Transfers Overall transfer level: Needs assistance Equipment used: Rolling walker (2 wheeled);1 person hand held assist Transfers: Sit to/from Stand Sit to Stand: Supervision         General transfer comment: S for his power up  Ambulation/Gait Ambulation/Gait assistance: Supervision Ambulation Distance (Feet): 250 Feet Assistive device: Rolling walker (2 wheeled);1 person hand held assist Gait Pattern/deviations: Step-through pattern;Decreased stride length;Trunk flexed Gait velocity: Decreased Gait velocity interpretation: Below normal speed for age/gender General Gait Details: speed was controlled but tends to go near obstacles   Stairs            Wheelchair Mobility    Modified Rankin (Stroke Patients Only)       Balance  Overall balance assessment: Needs assistance Sitting-balance support: Feet supported Sitting balance-Leahy Scale: Good     Standing balance support: Bilateral upper extremity supported;During functional activity Standing balance-Leahy Scale: Fair                              Cognition Arousal/Alertness: Awake/alert Behavior During Therapy: WFL for tasks assessed/performed Overall Cognitive Status: Within Functional Limits for tasks assessed                                        Exercises      General Comments        Pertinent Vitals/Pain Pain Assessment: No/denies pain    Home Living                      Prior Function            PT Goals (current goals can now be found in the care plan section) Acute Rehab PT Goals Patient Stated Goal: Return home Progress towards PT goals: Progressing toward goals    Frequency    Min 3X/week      PT Plan Current plan remains appropriate    Co-evaluation              AM-PAC PT "6 Clicks" Daily Activity  Outcome Measure  Difficulty turning over in bed (including adjusting  bedclothes, sheets and blankets)?: None Difficulty moving from lying on back to sitting on the side of the bed? : None Difficulty sitting down on and standing up from a chair with arms (e.g., wheelchair, bedside commode, etc,.)?: None Help needed moving to and from a bed to chair (including a wheelchair)?: A Little Help needed walking in hospital room?: A Little Help needed climbing 3-5 steps with a railing? : A Little 6 Click Score: 21    End of Session Equipment Utilized During Treatment: Gait belt Activity Tolerance: Patient tolerated treatment well Patient left: in chair;with call bell/phone within reach;with nursing/sitter in room Nurse Communication: Mobility status PT Visit Diagnosis: Other abnormalities of gait and mobility (R26.89)     Time: 1115-1130 PT Time Calculation (min) (ACUTE ONLY): 15  min  Charges:  $Gait Training: 8-22 mins                    G Codes:  Functional Assessment Tool Used: AM-PAC 6 Clicks Basic Mobility     Ramond Dial 01/14/2017, 1:45 PM   Mee Hives, PT MS Acute Rehab Dept. Number: [place] [facility] [place]

## 2017-01-14 NOTE — Progress Notes (Signed)
Discharge instructions (including medications) discussed with and copy provided to patient/caregiver 

## 2017-01-14 NOTE — Discharge Summary (Addendum)
Physician Discharge Summary  Evan [name] HER:[id number] DOB: [birth year] DOA: 01/11/2017  PCP: Deland Pretty, MD  Primary Cardiologist: Jenkins Rouge, MD    Admit date: 01/11/2017 Discharge date: 01/14/2017  Recommendations for Outpatient Follow-up:  1. Symptomatic anemia, ABLA secondary to GIB. Continue oral iron, f/u with Dr. Paulita Fujita in the outpatient setting for repeat CBC and Hemoccult. 2. Demand ischemia, Type 2 NSTEMI 3. HHPT/OT  Follow-up Information    Deland Pretty, MD. Schedule an appointment as soon as possible for a visit in 2 week(s).   Specialty:  Internal Medicine Contact information: [address] [place] [address] [phone]        [name], MD .   Specialty:  Cardiology Contact information: [phone] [address] [place] [address] [phone]        Arta Silence, MD. Schedule an appointment as soon as possible for a visit in 3 week(s).   Specialty:  Gastroenterology Contact information: [address] [place] [address] [phone]            Discharge Diagnoses:  1. Symptomatic anemia, ABLA secondary to GIB 2. Demand ischemia, type 2 NSTEMI 3. Thrombocytopenia 4. Chronic systolic/diastolic CHF 5. PAF, complete heart block s/p PPM.  6. DM type 2 7. CKD stage III  Discharge Condition: improved Disposition: return to ID[facility] with [facility] PT  Diet recommendation: heart healthy, diabetic diet  Filed Weights   01/12/17 0500 01/13/17 0543 01/14/17 0516  Weight: 68.6 kg (151 lb 3.2 oz) 69.5 kg (153 lb 3.2 oz) 69.8 kg (153 lb 14.4 oz)    History of present illness:  7yom PMH CAD, CABG, GIB, PAF not on anticoagulation prsented with CP, SOB, dark blood in stools. Admitted for symptomatic anemia, chest pain evaluation, GI evaluation.  Hospital Course:  Patient remained pain free and was seen by cardiology; conservative management was recommended given age and recurrent GIB, therefore  no antiplatelet agents. He was continued on statin, carvedilol and Entresto, as well as furosemide. No further workup was recommended by cardiology as management would not be altered.   Patient had a few episodes of bleeding which spontaneously resolved. Ht required transfusion PRBC but then Hgb stabilized. GI saw patient, recommended conservative management, no endoscopy. Post discharge rec GI f/u for monitoring of hemoccult and Hgb on iron; if no response could consider further evaluation.  Symptomatic anemia, ABLA secondary to GIB; superimposed on chronic normocytic anemia,  s/p transfusion 2 units PRBC - last admit for GIB 06/2016 at that time elevated troponin precluded endoscopy - off Plavix since 06/2016 - continue oral iron, f/u with Dr. Paulita Fujita in the outpatient setting for repeat CBC and Hemoccult.  Demand ischemia, type 2 NSTEMI. Cardiology rec no antiplatelets secondary to GIB. Troponin up to 3.05 1/7. Per cardiology no plans for cath or changes in medical management given h/o GIB. Echo deferred at that time as management would not be changed. - asymptomatic, continue BB. No ASA secondary to bleed.  Thrombocytopenia - secondary to blood loss; trending back up on discharge; no further evaluation suggested  Chronic systolic/diastolic CHF, LVEF 09-47%, CAD, s/p CABG 1996 - appears compensated 1/10; continue statin, BB, Entresto, Lasix 1/10  PAF, complete heart block s/p PPM. Not anticoag candidate secondary to GIB and falls - stable; continue amiodarone, carvedilol  DM type 2 - continue SSI, resume sitagliptin on discharge - CBG stable 1/10  CKD stage III, at baseline   Consultants:  Cardiology   GI  Procedures:  2 units PRBC 1/7-1/8  Today's assessment: S: GI saw patient, recommended conservative management, no endoscopy. Post discharge rec GI f/u for monitoring of hemoccult and Hgb on iron; if no response could consider further evaluation.  Feels well, no  bleeding, no pain. O: Vitals:  Vitals:   01/13/17 2139 01/14/17 0516  BP: (!) 141/56 (!) 129/40  Pulse:  61  Resp:  18  Temp:  98.3 F (36.8 C)  SpO2:  98%    Constitutional:   . Appears calm and comfortable. Ambulates to bathroom with walker but no other assistance Respiratory:  . CTA bilaterally, no w/r/r.  . Respiratory effort normal.  Cardiovascular:  . RRR, no m/r/g . 1+ BLE extremity edema   Psychiatric:    CBG stable Hgb 8.5 >> 8.8 Plts 126 >> 145   Discharge Instructions  Discharge Instructions    Diet - low sodium heart healthy   Complete by:  As directed    Discharge instructions   Complete by:  As directed    Call your physician or seek immediate medical attention for pain, bleeding, weakness or worsening of condition.   Increase activity slowly   Complete by:  As directed      Allergies as of 01/14/2017      Reactions   Clonidine Derivatives    unknown reaction   Codeine    unknown reaction   Meperidine Hcl    REACTION: Hypotension   Motrin [ibuprofen]    unknown reaction   Other    Celery - unknown reaction   Penicillins    unknown reaction   Demerol Other (See Comments)   hypotension   Heparin Other (See Comments)   hypotension   Metformin And Related Diarrhea      Medication List    TAKE these medications   acetaminophen 325 MG tablet Commonly known as:  TYLENOL Take 650 mg by mouth every 6 (six) hours as needed for mild pain.   amiodarone 100 MG tablet Commonly known as:  PACERONE TAKE 1 TABLET BY MOUTH EVERY DAY   atorvastatin 10 MG tablet Commonly known as:  LIPITOR Take 1 tablet (10 mg total) by mouth daily.   benzonatate 200 MG capsule Commonly known as:  TESSALON Take 1 capsule (200 mg total) by mouth 3 (three) times daily as needed for cough.   carvedilol 12.5 MG tablet Commonly known as:  COREG Take 1 tablet (12.5 mg total) by mouth 2 (two) times daily with a meal.   clopidogrel 75 MG tablet Commonly known as:   PLAVIX TAKE 1 TABLET BY MOUTH EVERY DAY WITH BREAKFAST   divalproex 250 MG 24 hr tablet Commonly known as:  DEPAKOTE ER Take 1 tablet (250 mg total) by mouth at bedtime. What changed:  Another medication with the same name was removed. Continue taking this medication, and follow the directions you see here.   ENTRESTO 24-26 MG Generic drug:  sacubitril-valsartan TAKE 1 TABLET BY MOUTH 2 TIMES DAILY   ferrous sulfate 324 (65 Fe) MG Tbec Take 1 tablet by mouth 2 (two) times daily.   finasteride 5 MG tablet Commonly known as:  PROSCAR Take 1 tablet (5 mg total) by mouth daily.   folic acid 431 MCG tablet Commonly known as:  FOLVITE Take 400 mcg by mouth 3 (three) times a week. M, Wed,Fri   furosemide 80 MG tablet Commonly known as:  LASIX Take 1 1/2 tablet by mouth daily for 3 days then go back to 1  tablet by mouth daily   isosorbide mononitrate 30 MG 24 hr tablet Commonly known as:  IMDUR Take 1 tablet (30 mg total) by mouth daily.   levothyroxine 112 MCG tablet Commonly known as:  SYNTHROID, LEVOTHROID Take 1 tablet (112 mcg total) by mouth daily before breakfast.   MELATONIN PO Take 10 mg by mouth at bedtime as needed.   nitroGLYCERIN 0.4 MG SL tablet Commonly known as:  NITROSTAT PLACE 1 TABLET (0.4 MG TOTAL) UNDER THE TONGUE EVERY 5 (FIVE) MINUTES AS NEEDED FOR CHEST PAIN.   saccharomyces boulardii 250 MG capsule Commonly known as:  FLORASTOR Take 1 capsule (250 mg total) by mouth 2 (two) times daily.   sitaGLIPtin 25 MG tablet Commonly known as:  JANUVIA Take 1 tablet (25 mg total) by mouth daily. Take one tablet by mouth once daily to control blood sugar   VITAMIN B12-FOLIC ACID PO Take 1 tablet by mouth every Monday, Wednesday, and Friday.      Allergies  Allergen Reactions  . Clonidine Derivatives     unknown reaction   . Codeine     unknown reaction   . Meperidine Hcl     REACTION: Hypotension  . Motrin [Ibuprofen]     unknown reaction   .  Other     Celery - unknown reaction  . Penicillins     unknown reaction   . Demerol Other (See Comments)    hypotension  . Heparin Other (See Comments)    hypotension  . Metformin And Related Diarrhea    The results of significant diagnostics from this hospitalization (including imaging, microbiology, ancillary and laboratory) are listed below for reference.    Significant Diagnostic Studies: Dg Chest 2 View  Result Date: 01/11/2017 CLINICAL DATA:  Chest pain. EXAM: CHEST  2 VIEW COMPARISON:  Chest x-ray dated August 02, 2016. FINDINGS: Unchanged left chest wall pacer device with leads terminating in the right atrium and right ventricle. Postsurgical changes related to prior CABG. Normal heart size. Atherosclerotic calcification of the aortic arch. Normal pulmonary vascularity. No focal consolidation, pleural effusion, or pneumothorax. No acute osseous abnormality. IMPRESSION: No active cardiopulmonary disease. Electronically Signed   By: Titus Dubin M.D.   On: 01/11/2017 12:47    Labs: Basic Metabolic Panel: Recent Labs  Lab 01/11/17 1202 01/12/17 0000 01/13/17 0653  NA 139 143 142  K 3.9 3.7 3.3*  CL 107 108 109  CO2 22 23 23   GLUCOSE 195* 125* 128*  BUN 32* 32* 29*  CREATININE 1.58* 1.55* 1.41*  CALCIUM 8.7* 8.4* 7.8*   Liver Function Tests: Recent Labs  Lab 01/12/17 0000  AST 20  ALT 10*  ALKPHOS 55  BILITOT 1.3*  PROT 5.8*  ALBUMIN 3.2*   CBC: Recent Labs  Lab 01/11/17 1202  01/12/17 0000 01/12/17 0317 01/12/17 0810 01/13/17 0653 01/14/17 0614  WBC 6.4  --  6.8 7.1  --  4.6 4.0  HGB 7.4*   < > 9.5* 9.4* 10.1* 8.5* 8.8*  HCT 24.5*   < > 30.5* 28.8* 31.5* 27.5* 28.2*  MCV 86.9  --  85.4 85.2  --  86.2 86.5  PLT 165  --  155 141*  --  136* 145*   < > = values in this interval not displayed.   Cardiac Enzymes: Recent Labs  Lab 01/11/17 1654 01/11/17 1908 01/11/17 2336  TROPONINI 0.51* 1.02* 3.05*    Recent Labs    07/30/16 0852  01/11/17 1645  BNP 1,276.9* 1,870.9*  CBG: Recent Labs  Lab 01/13/17 0735 01/13/17 1108 01/13/17 1624 01/13/17 2059 01/14/17 0746  GLUCAP 131* 156* 123* 170* 107*    Principal Problem:   Symptomatic anemia Active Problems:   Type 2 diabetes with nephropathy (HCC)   HYPERCHOLESTEROLEMIA   Iron deficiency anemia   CABG '96   GERD (gastroesophageal reflux disease)   Sick sinus syndrome (HCC)   PAF (paroxysmal atrial fibrillation) (HCC)   CAD S/P SVG-MO1/OM2 DES 12/11/13   Pacemaker implanted 12/12/13 (MDT)   Stage 3 chronic renal impairment associated with type 2 diabetes mellitus (HCC)   Chest pain   Elevated troponin   Thrombocytopenia (HCC)   CKD (chronic kidney disease), stage III ([place])   Time coordinating discharge: 35 minutes  Signed:  Murray Hodgkins, MD [facility] 01/14/2017, 10:27 AM

## 2017-01-14 NOTE — Care Management Note (Signed)
Case Management Note Previous Note Per: Kristen Cardinal, RN  Case Manager-Orientation [phone] 01/13/2017, 2:45 PM  Patient Details  Name: Evan [name] MRN: [id number] Date of Birth: 12-Aug-1923  Subjective/Objective:    Admitted with               symptomatic anemia, chest pain   Action/Plan: In to speak with patient, prior to admission patient lived at [facility] living. In to discuss recommendation for [facility] PT.  Patient stated he is active with [facility] [phone], Fax: [phone], already receiving PT/OT.  Agreed to resume orders at discharge.  Home DME: walker-4 wheels, shower seat-built in, Tub/shower-Grab bars. NCM will continue to follow for discharge transition.   Expected Discharge Date:  01/10/191/10/1017               Expected Discharge Plan:  Assisted Living / Rest Home  Discharge planning Services  CM Consult  HH Arranged:  PT, OT [facility] Agency:  Other - See comment(Resume orders)  Status of Service:  Completed, signed off  Patient has orders for discharge; Home Health Orders faxed to [facility] [phone]. Recevied successful notification. Call placed to [facility] at [phone] and notified her of referral being faxed. Juliann Pulse verbalized understanding.  Recevied notification successful. Kristen Cardinal, RN  Case Manager-Orientation [phone] 01/14/2017, 10:16 AM

## 2017-01-29 ENCOUNTER — Telehealth: Payer: Self-pay | Admitting: Surgery

## 2017-01-29 NOTE — Telephone Encounter (Addendum)
ED CM received call from patient's daughter Evan [name] with concerns that her dad had been discharged from [facility] on 1/10  with home health, but initial visit has not be set up.     ED CM reviewed patient's record, and noted that patient resides in [place], and he has been active with [facility] services, and referral for PT/OT was faxed with follow up call to  [place] at [place]. ED CM attempted to contact Evan [name] at [facility] regarding PT/OT expected start of therapy. Evan [name] and wants to continue with her. So daughter thanked me and states she will reach out to her and set up the therapy. No additional question or concerns expressed.

## 2017-02-01 ENCOUNTER — Ambulatory Visit: Payer: Medicare Other | Admitting: Cardiology

## 2017-02-18 ENCOUNTER — Ambulatory Visit: Payer: Medicare Other | Admitting: Cardiology

## 2017-03-15 NOTE — Progress Notes (Signed)
Cardiology Office Note   Date:  03/16/2017   ID:  LAZARO ISENHOWER, DOB 1923-06-22, MRN [id number]  PCP:  Clovia Cuff, MD  Cardiologist:  [name]     Chief Complaint  Patient presents with  . Congestive Heart Failure      History of Present Illness: Evan [name] is a 84 y.o. male who presents for post hospital visit.  He has a history of CAD status post CABG x5 in 1996, DES to SVG to OM 1/2 in 2015, LV dysfunction ejection fraction 25-30% 07/2016, PAF and PAT on amiodarone, PAD, complete heart block status post pacemaker, diabetes mellitus type 2 and hypothyroidism.  Patient has chronic weakness in the legs with atrophy and frequent falls.  He is not felt to be a good candidate for anticoagulation.  He had an admission 06/2016 with hematochezia and chest pain.  Hemoglobin was 8 and NSTEMI troponin peaked at 8.21.  He was not started on heparin.  Aspirin and Plavix were discontinued given active bleeding.  Readmitted 07/2016 with CHF Echo showed worsened ejection fraction 25-30%.  Norvasc was stopped and Entresto started.  Saw [name] 10/20/16 and had worsening lower extremity edema.  Lasix increased to 80 mg twice daily and Zaroxolyn 5 mg added Monday Wednesday Friday.  Creatinine 1.29 on 10/27/16. This has been stopped    He lives at [place] [facility] and eats prepared food there.  He does not add extra salt  Recent hospitalization 1/7 to 10/2017 with PAF no anticoagultion due to GI bleed.  Chronic systolic HF.  Demand ischemia .  pk troponin 3.05  Today no chest pain, none since before hospitalization.  No further bleeding that he is aware of.  He has had diarrhea and is on ABX for C Diff but now his diarrhea is almost completely resolved.  No SOB, no lightheadedness.  His appetite is good.  He has seen GI back.  He is no longer on asa but is on plavix.  He is here alone today with walker, he does still drive but rarely.  He takes melatonin for sleep.  He is sleeping better.   He continues with lower ext edema. He is on lasix 80 mg daily.  Stated if increased for 2-3 days it does nothing for his edema.    Past Medical History:  Diagnosis Date  . AC joint dislocation   . Allergic rhinitis   . Arthritis   . Atrial tachycardia ([place])   . BPH (benign prostatic hyperplasia)   . CAD   . CHF (congestive heart failure) ([place])   . Diarrhea   . DM    type 2  . GERD (gastroesophageal reflux disease)   . H/O: GI bleed   . Heart murmur   . HYPERCHOLESTEROLEMIA   . HYPERTENSION   . Hypothyroidism   . Iron deficiency anemia   . NEPHROLITHIASIS, HX OF   . Paroxysmal atrial fibrillation (HCC)   . Presence of permanent cardiac pacemaker 12/12/2013  . Rib fracture   . Stable angina (HCC)   . Syncope   . Thyroid disease   . TRANSIENT ISCHEMIC ATTACKS, HX OF   . 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 Urinary frequency   . Vertigo   . Weakness of left leg     Past Surgical History:  Procedure Laterality Date  . AV FISTULA REPAIR    . BACK SURGERY  2000'S  . CARDIAC CATHETERIZATION  12/11/2013  Procedure: CORONARY STENT INTERVENTION;  Surgeon: Burnell Blanks, MD;  Location: [facility] CATH LAB;  Service: Cardiovascular;;  DES 3.5 x 15 Resolute to seq SVG of OM1/OM2   . CORONARY ARTERY BYPASS GRAFT  1996  . CORONARY STENT PLACEMENT  12/11/13   SVG-OM DES  . FEMORAL ARTERY ANEURYSM REPAIR     feroral pseudo-aneurysm repair  . INSERT / REPLACE / REMOVE PACEMAKER    . LEFT HEART CATHETERIZATION WITH CORONARY/GRAFT ANGIOGRAM N/A 12/11/2013   Procedure: LEFT HEART CATHETERIZATION WITH Beatrix Fetters;  Surgeon: Burnell Blanks, MD;  Location: [facility] CATH LAB;  Service: Cardiovascular;  Laterality: N/A;  . PERMANENT PACEMAKER INSERTION N/A 12/12/2013   MDT ADDRL1 pacemaker implnated by Dr Rayann Heman  . PTCA  1980's     Current Outpatient Medications  Medication Sig Dispense Refill  . acetaminophen (TYLENOL) 325 MG tablet Take 650 mg  by mouth every 6 (six) hours as needed for mild pain.    Marland Kitchen amiodarone (PACERONE) 100 MG tablet TAKE 1 TABLET BY MOUTH EVERY DAY 30 tablet 11  . atorvastatin (LIPITOR) 10 MG tablet Take 1 tablet (10 mg total) by mouth daily. 30 tablet 0  . benzonatate (TESSALON) 200 MG capsule Take 1 capsule (200 mg total) by mouth 3 (three) times daily as needed for cough. 30 capsule 0  . carvedilol (COREG) 12.5 MG tablet Take 1 tablet (12.5 mg total) by mouth 2 (two) times daily with a meal. 60 tablet 0  . clopidogrel (PLAVIX) 75 MG tablet TAKE 1 TABLET BY MOUTH EVERY DAY WITH BREAKFAST 90 tablet 2  . Cobalamine Combinations (VITAMIN B12-FOLIC ACID PO) Take 1 tablet by mouth every Monday, Wednesday, and Friday.    . divalproex (DEPAKOTE ER) 250 MG 24 hr tablet Take 1 tablet (250 mg total) by mouth at bedtime. 30 tablet 0  . ENTRESTO 24-26 MG TAKE 1 TABLET BY MOUTH 2 TIMES DAILY 60 tablet 11  . ferrous sulfate 324 (65 Fe) MG TBEC Take 1 tablet by mouth 2 (two) times daily.    . finasteride (PROSCAR) 5 MG tablet Take 1 tablet (5 mg total) by mouth daily. 30 tablet 0  . folic acid (FOLVITE) 379 MCG tablet Take 400 mcg by mouth 3 (three) times a week. M, Wed,Fri    . furosemide (LASIX) 80 MG tablet Take 1 1/2 tablet by mouth daily for 3 days then go back to 1 tablet by mouth daily 105 tablet 3  . isosorbide mononitrate (IMDUR) 30 MG 24 hr tablet Take 1 tablet (30 mg total) by mouth daily. 30 tablet 0  . levothyroxine (SYNTHROID, LEVOTHROID) 112 MCG tablet Take 1 tablet (112 mcg total) by mouth daily before breakfast. 30 tablet 0  . MELATONIN PO Take 10 mg by mouth at bedtime as needed.    . nitroGLYCERIN (NITROSTAT) 0.4 MG SL tablet PLACE 1 TABLET (0.4 MG TOTAL) UNDER THE TONGUE EVERY 5 (FIVE) MINUTES AS NEEDED FOR CHEST PAIN. 25 tablet 0  . saccharomyces boulardii (FLORASTOR) 250 MG capsule Take 1 capsule (250 mg total) by mouth 2 (two) times daily. 60 capsule 0  . sitaGLIPtin (JANUVIA) 25 MG tablet Take 1 tablet (25  mg total) by mouth daily. Take one tablet by mouth once daily to control blood sugar 30 tablet 0   No current facility-administered medications for this visit.     Allergies:   Clonidine; Clonidine derivatives; Codeine; Meperidine; Meperidine hcl; Metformin; Motrin [ibuprofen]; Other; Penicillins; Demerol; Heparin; and Metformin and related    Social  History:  The patient  reports that he quit smoking about 61 years ago. he has never used smokeless tobacco. He reports that he does not drink alcohol or use drugs.   Family History:  The patient's family history includes Coronary artery disease in his father; Diabetes in his mother.    ROS:  General:no colds or fevers, no weight changes Skin:no rashes or ulcers- scalp wound after biopsy last week by dermatology HEENT:no blurred vision, no congestion CV:see HPI PUL:see HPI GI:no diarrhea constipation or melena, no indigestion GU:no hematuria, no dysuria MS:no joint pain, no claudication Neuro:no syncope, no lightheadedness Endo:+ diabetes, + thyroid disease  Wt Readings from Last 3 Encounters:  03/16/17 154 lb (69.9 kg)  01/14/17 153 lb 14.4 oz (69.8 kg)  11/17/16 153 lb 6.4 oz (69.6 kg)     PHYSICAL EXAM: VS:  BP 138/60   Pulse 68   Ht 5\' 7"  (1.702 m)   Wt 154 lb (69.9 kg)   SpO2 99%   BMI 24.12 kg/m  , BMI Body mass index is 24.12 kg/m. General:Pleasant affect, NAD Skin:Warm and dry, brisk capillary refill HEENT:normocephalic, sclera clear, mucus membranes moist Neck:supple, no JVD, no bruits  Heart:S1S2 RRR with 2/6 systolic murmur, no gallup, rub or click Lungs:clear without rales, rhonchi, or wheezes KGM:WNUU, non tender, + BS, do not palpate liver spleen or masses Ext:1-2+ lower ext edema to mid calf, 2+ pedal pulses, 2+ radial pulses Neuro:alert and oriented X 3, MAE, follows commands, + facial symmetry    EKG:  EKG is NOT ordered today.  Recent Labs: 07/30/2016: Magnesium 2.1 01/11/2017: B Natriuretic Peptide  1,870.9; TSH 1.610 01/12/2017: ALT 10 01/13/2017: BUN 29; Creatinine, Ser 1.41; Potassium 3.3; Sodium 142 01/14/2017: Hemoglobin 8.8; Platelets 145    Lipid Panel No results found for: CHOL, TRIG, HDL, CHOLHDL, VLDL, LDLCALC, LDLDIRECT     Other studies Reviewed: Additional studies/ records that were reviewed today include: . Echo 07/30/16 Study Conclusions  - Left ventricle: The cavity size was normal. Systolic function was   severely reduced. The estimated ejection fraction was in the   range of 25% to 30%. Severe diffuse hypokinesis with no   identifiable regional variations. There is substantial systolic   dyssynchrony, both septal-to-lateral wall and apex-to-base.   Doppler parameters are consistent with restrictive physiology,   indicative of decreased left ventricular diastolic compliance   and/or increased left atrial pressure. Acoustic contrast   opacification revealed no evidence ofthrombus. - Ventricular septum: Septal motion showed abnormal function,   dyssynergy, and paradox. These changes are consistent with right   ventricular pacing. - Aortic valve: Noncoronary cusp mobility was severely restricted.   Transvalvular velocity was increased less than expected, due to   low cardiac output. There was mild stenosis. There was mild   regurgitation directed centrally in the LVOT. Valve area (VTI):   1.41 cm^2. - Mitral valve: Calcified annulus. There was mild to moderate   regurgitation directed centrally. - Left atrium: The atrium was moderately dilated. - Right ventricle: Systolic function was mildly reduced. - Tricuspid valve: There was moderate regurgitation. - Pulmonary arteries: Systolic pressure was moderately increased.   PA peak pressure: 63 mm Hg (S).   ASSESSMENT AND PLAN:  1.  Recent GI bleed and now C diff, followed by GI.  Remains on plavix but no ASA. Check CBC today for anemia.   2. CAD with elevated troponin with Hx CABG during recent  hospitalization.  No current chest pain.  Discussed with Dr.  Nishan plan to treat conservatively.  Continue plavix no asa.   3.  Chronic systolic and diastolic HF.  Only lower ext edema today, discussed with [name] and will switch to demadex 40 mg daily and see if this works better.  Will have him follow up with [name] in 2 months.  Will check BMP today   4.  PAF has been SR, has PPM rate regular, no anticoagulation due to GI bleed.   5.  HTN controlled.  Continue meds.   6.  Hypokalemia on last labs recheck BMP.   Current medicines are reviewed with the patient today.  The patient Has no concerns regarding medicines.  The following changes have been made:  See above Labs/ tests ordered today include:see above  Disposition:   FU:  see above  Signed, Cecilie Kicks, NP  03/16/2017 10:09 AM    [facility], [place], [place], [place] Phone: [phone]; Fax: [phone]

## 2017-03-16 ENCOUNTER — Encounter: Payer: Self-pay | Admitting: Cardiology

## 2017-03-16 ENCOUNTER — Ambulatory Visit: Payer: Medicare Other | Admitting: Cardiology

## 2017-03-16 ENCOUNTER — Encounter: Payer: Self-pay | Admitting: *Deleted

## 2017-03-16 VITALS — BP 138/60 | HR 68 | Ht 67.0 in | Wt 154.0 lb

## 2017-03-16 DIAGNOSIS — Z8719 Personal history of other diseases of the digestive system: Secondary | ICD-10-CM

## 2017-03-16 DIAGNOSIS — R42 Dizziness and giddiness: Secondary | ICD-10-CM | POA: Insufficient documentation

## 2017-03-16 DIAGNOSIS — R350 Frequency of micturition: Secondary | ICD-10-CM | POA: Insufficient documentation

## 2017-03-16 DIAGNOSIS — N189 Chronic kidney disease, unspecified: Secondary | ICD-10-CM

## 2017-03-16 DIAGNOSIS — I10 Essential (primary) hypertension: Secondary | ICD-10-CM | POA: Diagnosis not present

## 2017-03-16 DIAGNOSIS — C4490 Unspecified malignant neoplasm of skin, unspecified: Secondary | ICD-10-CM | POA: Insufficient documentation

## 2017-03-16 DIAGNOSIS — I5042 Chronic combined systolic (congestive) and diastolic (congestive) heart failure: Secondary | ICD-10-CM

## 2017-03-16 DIAGNOSIS — Z950 Presence of cardiac pacemaker: Secondary | ICD-10-CM

## 2017-03-16 DIAGNOSIS — R296 Repeated falls: Secondary | ICD-10-CM | POA: Insufficient documentation

## 2017-03-16 DIAGNOSIS — G8929 Other chronic pain: Secondary | ICD-10-CM | POA: Insufficient documentation

## 2017-03-16 DIAGNOSIS — I259 Chronic ischemic heart disease, unspecified: Secondary | ICD-10-CM | POA: Diagnosis not present

## 2017-03-16 DIAGNOSIS — H3530 Unspecified macular degeneration: Secondary | ICD-10-CM | POA: Insufficient documentation

## 2017-03-16 DIAGNOSIS — N200 Calculus of kidney: Secondary | ICD-10-CM | POA: Insufficient documentation

## 2017-03-16 DIAGNOSIS — K922 Gastrointestinal hemorrhage, unspecified: Secondary | ICD-10-CM | POA: Insufficient documentation

## 2017-03-16 DIAGNOSIS — I480 Paroxysmal atrial fibrillation: Secondary | ICD-10-CM

## 2017-03-16 DIAGNOSIS — E782 Mixed hyperlipidemia: Secondary | ICD-10-CM | POA: Insufficient documentation

## 2017-03-16 DIAGNOSIS — G459 Transient cerebral ischemic attack, unspecified: Secondary | ICD-10-CM | POA: Insufficient documentation

## 2017-03-16 DIAGNOSIS — I219 Acute myocardial infarction, unspecified: Secondary | ICD-10-CM | POA: Insufficient documentation

## 2017-03-16 DIAGNOSIS — I2510 Atherosclerotic heart disease of native coronary artery without angina pectoris: Secondary | ICD-10-CM | POA: Insufficient documentation

## 2017-03-16 DIAGNOSIS — R29898 Other symptoms and signs involving the musculoskeletal system: Secondary | ICD-10-CM | POA: Insufficient documentation

## 2017-03-16 DIAGNOSIS — M549 Dorsalgia, unspecified: Secondary | ICD-10-CM

## 2017-03-16 LAB — BASIC METABOLIC PANEL
BUN/Creatinine Ratio: 13 (ref 10–24)
BUN: 19 mg/dL (ref 10–36)
CALCIUM: 9.2 mg/dL (ref 8.6–10.2)
CO2: 25 mmol/L (ref 20–29)
CREATININE: 1.48 mg/dL — AB (ref 0.76–1.27)
Chloride: 102 mmol/L (ref 96–106)
GFR, EST AFRICAN AMERICAN: 47 mL/min/{1.73_m2} — AB (ref >59)
GFR, EST NON AFRICAN AMERICAN: 41 mL/min/{1.73_m2} — AB (ref >59)
Glucose: 94 mg/dL (ref 65–99)
Potassium: 4.8 mmol/L (ref 3.5–5.2)
Sodium: 141 mmol/L (ref 134–144)

## 2017-03-16 LAB — CBC WITH DIFFERENTIAL/PLATELET
BASOS: 0 %
Basophils Absolute: 0 10*3/uL (ref 0.0–0.2)
EOS (ABSOLUTE): 0.1 10*3/uL (ref 0.0–0.4)
Eos: 1 %
HEMOGLOBIN: 12.8 g/dL — AB (ref 13.0–17.7)
Hematocrit: 39 % (ref 37.5–51.0)
IMMATURE GRANS (ABS): 0 10*3/uL (ref 0.0–0.1)
Immature Granulocytes: 0 %
LYMPHS: 18 %
Lymphocytes Absolute: 0.9 10*3/uL (ref 0.7–3.1)
MCH: 28.8 pg (ref 26.6–33.0)
MCHC: 32.8 g/dL (ref 31.5–35.7)
MCV: 88 fL (ref 79–97)
Monocytes Absolute: 0.4 10*3/uL (ref 0.1–0.9)
Monocytes: 8 %
NEUTROS ABS: 3.6 10*3/uL (ref 1.4–7.0)
Neutrophils: 73 %
PLATELETS: 159 10*3/uL (ref 150–379)
RBC: 4.44 x10E6/uL (ref 4.14–5.80)
RDW: 16.8 % — ABNORMAL HIGH (ref 12.3–15.4)
WBC: 5 10*3/uL (ref 3.4–10.8)

## 2017-03-16 MED ORDER — TORSEMIDE 20 MG PO TABS: 40.0000 mg | ORAL_TABLET | Freq: Every day | ORAL | 4 refills | Status: DC

## 2017-03-16 NOTE — Patient Instructions (Addendum)
Medication Instructions: Your physician has recommended you make the following change in your medication: -1) STOP Furosemide (Lasix) -2) START Torsemide (Demadex) 20 mg - Take 2 tablets (40 mg) by mouth daily   Labwork: Your physician has recommended that you have lab work today: BMET and CBC  Procedures/Testing: None Ordered  Follow-Up: Your physician recommends that you schedule a follow-up appointment in: 2-3 MONTHS with [name]   If you need a refill on your cardiac medications before your next appointment, please call your pharmacy.

## 2017-03-18 ENCOUNTER — Telehealth: Payer: Self-pay | Admitting: Cardiovascular Disease

## 2017-03-18 ENCOUNTER — Telehealth: Payer: Self-pay | Admitting: Cardiology

## 2017-03-18 NOTE — Telephone Encounter (Signed)
Medication Instructions: Your physician has recommended you make the following change in your medication: -1) STOP Furosemide (Lasix) -2) START Torsemide (Demadex) 20 mg - Take 2 tablets (40 mg) by mouth daily  Called pharmacy to confirmed medications changes. Per office note on 03/16/17 patient is to stop Furosemide and start Torsemide as written above. Pharmacy was requesting for a pre auth for next time patient has medication filled. Patient told pharmacy he would pay out of pocket this time. Will forward to [employer] our pre Agricultural consultant.

## 2017-03-18 NOTE — Telephone Encounter (Signed)
Left message to call back regarding lab results.

## 2017-03-18 NOTE — Telephone Encounter (Signed)
New message    torsemide (DEMADEX) 20 MG tablet Take 2 tablets (40 mg total) by mouth daily.   Was patient advised to stop the lasik and start taking the torsemide or is he suppose to take both

## 2017-03-18 NOTE — Telephone Encounter (Signed)
New message ° ° ° °Patient returning call for lab results . Please call °

## 2017-03-19 NOTE — Telephone Encounter (Signed)
-----   Message from Isaiah Serge, NP sent at 03/16/2017  5:25 PM EDT ----- Labs are good --let us know how the demadex works.  Thanks.

## 2017-03-19 NOTE — Telephone Encounter (Signed)
Returned pts call and discussed his lab results. See result note. 

## 2017-03-19 NOTE — Progress Notes (Signed)
Pt has been made aware of normal result and verbalized understanding.  jw 03/19/17

## 2017-03-22 NOTE — Telephone Encounter (Signed)
**Note De-Identified Evan [name] Obfuscation** I have done a Torsemide PA through covermymeds.

## 2017-03-22 NOTE — Telephone Encounter (Signed)
Approval on Torsemide PA received via fax from [place]. Approval good until 03/23/18.  I have notified the pts pharmacy.

## 2017-03-30 NOTE — Progress Notes (Signed)
Electrophysiology Office Note Date: 04/08/2017  ID:  Evan [name], DOB 12/21/1923, MRN [id number]  PCP: Clovia Cuff, MD Primary Cardiologist: Johnsie Cancel Electrophysiologist: Allred  CC: Pacemaker follow-up  Evan [name] is a 84 y.o. male seen today for Dr Rayann Heman.  He presents today for routine electrophysiology followup.  Since last being seen in our clinic, the patient reports doing relatively well.  He is living at [facility]. He continues with LE edema L>R.   He denies chest pain, palpitations, dyspnea, PND, orthopnea, nausea, vomiting, dizziness, syncope, weight gain, or early satiety.  Device History: MDT dual chamber PPM implanted 2015 for symptomatic bradycardia   Past Medical History:  Diagnosis Date  . AC joint dislocation   . Allergic rhinitis   . Arthritis   . Atrial tachycardia ([place])   . BPH (benign prostatic hyperplasia)   . CAD   . CHF (congestive heart failure) ([place])   . DM    type 2  . GERD (gastroesophageal reflux disease)   . H/O: GI bleed   . HYPERCHOLESTEROLEMIA   . HYPERTENSION   . Hypothyroidism   . Iron deficiency anemia   . NEPHROLITHIASIS, HX OF   . Paroxysmal atrial fibrillation (HCC)   . Rib fracture   . TRANSIENT ISCHEMIC ATTACKS, HX OF   . 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 Vertigo    Past Surgical History:  Procedure Laterality Date  . AV FISTULA REPAIR    . BACK SURGERY  2000'S  . CARDIAC CATHETERIZATION  12/11/2013   Procedure: CORONARY STENT INTERVENTION;  Surgeon: Burnell Blanks, MD;  Location: [facility] CATH LAB;  Service: Cardiovascular;;  DES 3.5 x 15 Resolute to seq SVG of OM1/OM2   . CORONARY ARTERY BYPASS GRAFT  1996  . CORONARY STENT PLACEMENT  12/11/13   SVG-OM DES  . FEMORAL ARTERY ANEURYSM REPAIR     feroral pseudo-aneurysm repair  . INSERT / REPLACE / REMOVE PACEMAKER    . LEFT HEART CATHETERIZATION WITH CORONARY/GRAFT ANGIOGRAM N/A 12/11/2013   Procedure: LEFT HEART  CATHETERIZATION WITH Beatrix Fetters;  Surgeon: Burnell Blanks, MD;  Location: [facility] CATH LAB;  Service: Cardiovascular;  Laterality: N/A;  . PERMANENT PACEMAKER INSERTION N/A 12/12/2013   MDT ADDRL1 pacemaker implnated by Dr Rayann Heman  . PTCA  1980's    Current Outpatient Medications  Medication Sig Dispense Refill  . acetaminophen (TYLENOL) 325 MG tablet Take 650 mg by mouth every 6 (six) hours as needed for mild pain.    Marland Kitchen amiodarone (PACERONE) 200 MG tablet Take 100 mg by mouth daily.  11  . atorvastatin (LIPITOR) 10 MG tablet Take 1 tablet (10 mg total) by mouth daily. 30 tablet 0  . benzonatate (TESSALON) 200 MG capsule Take 1 capsule (200 mg total) by mouth 3 (three) times daily as needed for cough. 30 capsule 0  . carvedilol (COREG) 12.5 MG tablet Take 1 tablet (12.5 mg total) by mouth 2 (two) times daily with a meal. 60 tablet 0  . clopidogrel (PLAVIX) 75 MG tablet TAKE 1 TABLET BY MOUTH EVERY DAY WITH BREAKFAST 90 tablet 2  . Cobalamine Combinations (VITAMIN B12-FOLIC ACID PO) Take 1 tablet by mouth every Monday, Wednesday, and Friday.    . divalproex (DEPAKOTE ER) 250 MG 24 hr tablet Take 1 tablet (250 mg total) by mouth at bedtime. 30 tablet 0  . ENTRESTO 24-26 MG TAKE 1 TABLET BY MOUTH 2 TIMES DAILY 60 tablet 11  .  ferrous sulfate 324 (65 Fe) MG TBEC Take 1 tablet by mouth 2 (two) times daily.    . finasteride (PROSCAR) 5 MG tablet Take 1 tablet (5 mg total) by mouth daily. 30 tablet 0  . folic acid (FOLVITE) 062 MCG tablet Take 400 mcg by mouth 3 (three) times a week. M, Wed,Fri    . isosorbide mononitrate (IMDUR) 30 MG 24 hr tablet Take 1 tablet (30 mg total) by mouth daily. 30 tablet 0  . levothyroxine (SYNTHROID, LEVOTHROID) 112 MCG tablet Take 1 tablet (112 mcg total) by mouth daily before breakfast. 30 tablet 0  . meclizine (ANTIVERT) 25 MG tablet Take 25 mg by mouth 3 (three) times daily as needed.  2  . MELATONIN PO Take 10 mg by mouth at bedtime as needed.    .  nitroGLYCERIN (NITROSTAT) 0.4 MG SL tablet PLACE 1 TABLET (0.4 MG TOTAL) UNDER THE TONGUE EVERY 5 (FIVE) MINUTES AS NEEDED FOR CHEST PAIN. 25 tablet 0  . saccharomyces boulardii (FLORASTOR) 250 MG capsule Take 1 capsule (250 mg total) by mouth 2 (two) times daily. 60 capsule 0  . sitaGLIPtin (JANUVIA) 25 MG tablet Take 1 tablet (25 mg total) by mouth daily. Take one tablet by mouth once daily to control blood sugar 30 tablet 0  . torsemide (DEMADEX) 20 MG tablet Take 2 tablets (40 mg total) by mouth daily. 60 tablet 4   No current facility-administered medications for this visit.     Allergies:   Clonidine; Clonidine derivatives; Codeine; Meperidine; Meperidine hcl; Metformin; Motrin [ibuprofen]; Other; Penicillins; Demerol; Heparin; and Metformin and related   Social History: Social History   Socioeconomic History  . Marital status: Widowed    Spouse name: Not on file  . Number of children: Not on file  . Years of education: Not on file  . Highest education level: Not on file  Occupational History  . Not on file  Social Needs  . Financial resource strain: Not on file  . Food insecurity:    Worry: Not on file    Inability: Not on file  . Transportation needs:    Medical: Not on file    Non-medical: Not on file  Tobacco Use  . Smoking status: Former Smoker    Last attempt to quit: 01/06/1956    Years since quitting: 61.2  . Smokeless tobacco: Never Used  Substance and Sexual Activity  . Alcohol use: No    Alcohol/week: 0.0 oz  . Drug use: No  . Sexual activity: Not Currently  Lifestyle  . Physical activity:    Days per week: Not on file    Minutes per session: Not on file  . Stress: Not on file  Relationships  . Social connections:    Talks on phone: Not on file    Gets together: Not on file    Attends religious service: Not on file    Active member of club or organization: Not on file    Attends meetings of clubs or organizations: Not on file    Relationship status:  Not on file  . Intimate partner violence:    Fear of current or ex partner: Not on file    Emotionally abused: Not on file    Physically abused: Not on file    Forced sexual activity: Not on file  Other Topics Concern  . Not on file  Social History Narrative   Pt is married and lives with his wife.  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      Family History: Family History  Problem Relation Age of Onset  . Diabetes Mother   . Coronary artery disease Father      Review of Systems: All other systems reviewed and are otherwise negative except as noted above.   Physical Exam: VS:  BP 112/64   Pulse 66   Ht 5\' 7"  (1.702 m)   Wt 153 lb (69.4 kg)   SpO2 99%   BMI 23.96 kg/m  , BMI Body mass index is 23.96 kg/m.  GEN- The patient is elderly appearing, alert and oriented x 3 today.   HEENT: normocephalic, atraumatic; sclera clear, conjunctiva pink; hearing intact; oropharynx clear; neck supple, poor dentition Lungs- Clear to ausculation bilaterally, normal work of breathing.  No wheezes, rales, rhonchi Heart- Regular rate and rhythm (paced) GI- soft, non-tender, non-distended, bowel sounds present  Extremities- no clubbing, cyanosis, or edema  MS- no significant deformity or atrophy Skin- warm and dry, no rash or lesion; PPM pocket well healed Psych- euthymic mood, full affect Neuro- strength and sensation are intact  PPM Interrogation- reviewed in detail today,  See PACEART report  EKG:  EKG is not ordered today.  Recent Labs: 07/30/2016: Magnesium 2.1 01/11/2017: B Natriuretic Peptide 1,870.9; TSH 1.610 01/12/2017: ALT 10 03/16/2017: BUN 19; Creatinine, Ser 1.48; Hemoglobin 12.8; Platelets 159; Potassium 4.8; Sodium 141   Wt Readings from Last 3 Encounters:  04/08/17 153 lb (69.4 kg)  03/16/17 154 lb (69.9 kg)  01/14/17 153 lb 14.4 oz (69.8 kg)     Other studies Reviewed: Additional studies/ records that were reviewed  today include: Dr Rayann Heman and Dr Kyla Balzarine office notes  Assessment and Plan:  1.  Complete heart block Normal PPM function See Pace Art report No changes today  2.  HTN Stable No change required today  3.  CAD  No recent ischemic symptoms Continue current therapy No ASA with recent GI bleed  4.  Atrial tachycardia No recurrence by device interrogation Continue low dose amiodarone, TSH, LFT's at next office visit    Current medicines are reviewed at length with the patient today.   The patient does not have concerns regarding his medicines.  The following changes were made today:  none  Labs/ tests ordered today include:   Orders Placed This Encounter  Procedures  . CUP PACEART INCLINIC DEVICE CHECK     Disposition:   Follow up with [facility], Dr Rayann Heman 1 year      Signed, Chanetta Marshall, NP 04/08/2017 9:31 AM  [facility] [address] [place] [address] [phone] (office) [phone] (fax)

## 2017-04-08 ENCOUNTER — Encounter: Payer: Self-pay | Admitting: Nurse Practitioner

## 2017-04-08 ENCOUNTER — Ambulatory Visit: Payer: Medicare Other | Admitting: Nurse Practitioner

## 2017-04-08 VITALS — BP 112/64 | HR 66 | Ht 67.0 in | Wt 153.0 lb

## 2017-04-08 DIAGNOSIS — I25810 Atherosclerosis of coronary artery bypass graft(s) without angina pectoris: Secondary | ICD-10-CM | POA: Diagnosis not present

## 2017-04-08 DIAGNOSIS — I10 Essential (primary) hypertension: Secondary | ICD-10-CM | POA: Diagnosis not present

## 2017-04-08 DIAGNOSIS — I442 Atrioventricular block, complete: Secondary | ICD-10-CM

## 2017-04-08 DIAGNOSIS — I471 Supraventricular tachycardia: Secondary | ICD-10-CM | POA: Diagnosis not present

## 2017-04-08 LAB — CUP PACEART INCLINIC DEVICE CHECK
Implantable Lead Implant Date: 20151208
Implantable Lead Location: 753859
Implantable Lead Model: 5092
Implantable Pulse Generator Implant Date: 20151208
MDC IDC LEAD IMPLANT DT: 20151208
MDC IDC LEAD LOCATION: 753860
MDC IDC SESS DTM: 20190404091723

## 2017-04-08 NOTE — Patient Instructions (Addendum)
Medication Instructions:   Your physician recommends that you continue on your current medications as directed. Please refer to the Current Medication list given to you today.   If you need a refill on your cardiac medications before your next appointment, please call your pharmacy.  Labwork: NONE ORDERED  TODAY    Testing/Procedures: NONE ORDERED  TODAY      Follow-Up:   Your physician wants you to follow-up in: [place] will receive a reminder letter in the mail two months in advance. If you don't receive a letter, please call our office to schedule the follow-up appointment.    Remote monitoring is used to monitor your Pacemaker of ICD from home. This monitoring reduces the number of office visits required to check your device to one time per year. It allows [place] to keep an eye on the functioning of your device to ensure it is working properly. You are scheduled for a device check from home on . 07-10-17 You may send your transmission at any time that day. If you have a wireless device, the transmission will be sent automatically. After your physician reviews your transmission, you will receive a postcard with your next transmission date.     Any Other Special Instructions Will Be Listed Below (If Applicable).

## 2017-05-10 ENCOUNTER — Other Ambulatory Visit: Payer: Self-pay | Admitting: Cardiovascular Disease

## 2017-05-10 DIAGNOSIS — I25810 Atherosclerosis of coronary artery bypass graft(s) without angina pectoris: Secondary | ICD-10-CM

## 2017-05-10 DIAGNOSIS — I5043 Acute on chronic combined systolic (congestive) and diastolic (congestive) heart failure: Secondary | ICD-10-CM

## 2017-06-02 NOTE — Progress Notes (Signed)
Cardiology Office Note   Date:  06/03/2017   ID:  Evan [name], DOB 06-Aug-1923, MRN [id number]  PCP:  Clovia Cuff, MD  Cardiologist:  [name]     No chief complaint on file.     History of Present Illness:  84 y.o.  history of CAD status post CABG x5 in 1996, DES to SVG to OM 1/2 in 2015, LV dysfunction ejection fraction 25-30% 07/2016, PAF and PAT on amiodarone, PAD, complete heart block status post pacemaker, diabetes mellitus type 2 and hypothyroidism.  Patient has chronic weakness in the legs with atrophy and frequent falls.  He is not felt to be a good candidate for anticoagulation.  He had an admission 06/2016 with hematochezia and chest pain.  Hemoglobin was 8 and NSTEMI troponin peaked at 8.21.  He was not started on heparin.  Aspirin and Plavix were discontinued given active bleeding.  Readmitted 07/2016 with CHF Echo showed worsened ejection fraction 25-30%.  Norvasc was stopped and Entresto started.  Saw [name] 10/20/16 and had worsening lower extremity edema.  Lasix increased to 80 mg twice daily and Zaroxolyn 5 mg added Monday Wednesday Friday.  Creatinine 1.29 on 10/27/16. This has been stopped    Hospitalization 1/7 to 10/2017 with PAF no anticoagultion due to GI bleed.  Chronic systolic HF.  Demand ischemia .  pk troponin 3.05  Living at [facility] earlier in year Rx Has chronic dia rhea takes imodium every few days Sees Magod from [facility]  Past Medical History:  Diagnosis Date  . AC joint dislocation   . Allergic rhinitis   . Arthritis   . Atrial tachycardia ([place])   . BPH (benign prostatic hyperplasia)   . CAD   . CHF (congestive heart failure) ([place])   . DM    type 2  . GERD (gastroesophageal reflux disease)   . H/O: GI bleed   . HYPERCHOLESTEROLEMIA   . HYPERTENSION   . Hypothyroidism   . Iron deficiency anemia   . NEPHROLITHIASIS, HX OF   . Paroxysmal atrial fibrillation (HCC)   . Rib fracture   . TRANSIENT ISCHEMIC  ATTACKS, HX OF   . 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 Vertigo     Past Surgical History:  Procedure Laterality Date  . AV FISTULA REPAIR    . BACK SURGERY  2000'S  . CARDIAC CATHETERIZATION  12/11/2013   Procedure: CORONARY STENT INTERVENTION;  Surgeon: Burnell Blanks, MD;  Location: [facility] CATH LAB;  Service: Cardiovascular;;  DES 3.5 x 15 Resolute to seq SVG of OM1/OM2   . CORONARY ARTERY BYPASS GRAFT  1996  . CORONARY STENT PLACEMENT  12/11/13   SVG-OM DES  . FEMORAL ARTERY ANEURYSM REPAIR     feroral pseudo-aneurysm repair  . INSERT / REPLACE / REMOVE PACEMAKER    . LEFT HEART CATHETERIZATION WITH CORONARY/GRAFT ANGIOGRAM N/A 12/11/2013   Procedure: LEFT HEART CATHETERIZATION WITH Beatrix Fetters;  Surgeon: Burnell Blanks, MD;  Location: [facility] CATH LAB;  Service: Cardiovascular;  Laterality: N/A;  . PERMANENT PACEMAKER INSERTION N/A 12/12/2013   MDT ADDRL1 pacemaker implnated by Dr Rayann Heman  . PTCA  1980's     Current Outpatient Medications  Medication Sig Dispense Refill  . acetaminophen (TYLENOL) 325 MG tablet Take 650 mg by mouth every 6 (six) hours as needed for mild pain.    Marland Kitchen amiodarone (PACERONE) 200 MG tablet Take 100 mg by mouth  daily.  11  . atorvastatin (LIPITOR) 10 MG tablet Take 1 tablet (10 mg total) by mouth daily. 30 tablet 0  . carvedilol (COREG) 12.5 MG tablet Take 1 tablet (12.5 mg total) by mouth 2 (two) times daily with a meal. 60 tablet 0  . clopidogrel (PLAVIX) 75 MG tablet TAKE 1 TABLET BY MOUTH EVERY DAY WITH BREAKFAST 90 tablet 2  . Cobalamine Combinations (VITAMIN B12-FOLIC ACID PO) Take 1 tablet by mouth every Monday, Wednesday, and Friday.    . divalproex (DEPAKOTE ER) 250 MG 24 hr tablet Take 1 tablet (250 mg total) by mouth at bedtime. 30 tablet 0  . ENTRESTO 24-26 MG TAKE 1 TABLET BY MOUTH 2 TIMES DAILY 60 tablet 11  . ferrous sulfate 324 (65 Fe) MG TBEC Take 1 tablet by mouth 2 (two) times daily.    .  finasteride (PROSCAR) 5 MG tablet Take 1 tablet (5 mg total) by mouth daily. 30 tablet 0  . folic acid (FOLVITE) 578 MCG tablet Take 400 mcg by mouth 3 (three) times a week. M, Wed,Fri    . isosorbide mononitrate (IMDUR) 30 MG 24 hr tablet TAKE 1 TABLET BY MOUTH EVERY DAY 90 tablet 3  . levothyroxine (SYNTHROID, LEVOTHROID) 112 MCG tablet Take 1 tablet (112 mcg total) by mouth daily before breakfast. 30 tablet 0  . Loperamide HCl (IMODIUM PO) Take by mouth as directed.    . meclizine (ANTIVERT) 25 MG tablet Take 25 mg by mouth 3 (three) times daily as needed.  2  . MELATONIN PO Take 10 mg by mouth at bedtime as needed.    . nitroGLYCERIN (NITROSTAT) 0.4 MG SL tablet PLACE 1 TABLET (0.4 MG TOTAL) UNDER THE TONGUE EVERY 5 (FIVE) MINUTES AS NEEDED FOR CHEST PAIN. 25 tablet 0  . saccharomyces boulardii (FLORASTOR) 250 MG capsule Take 1 capsule (250 mg total) by mouth 2 (two) times daily. 60 capsule 0  . sitaGLIPtin (JANUVIA) 25 MG tablet Take 1 tablet (25 mg total) by mouth daily. Take one tablet by mouth once daily to control blood sugar 30 tablet 0  . torsemide (DEMADEX) 20 MG tablet Take 2 tablets (40 mg total) by mouth daily. 60 tablet 4   No current facility-administered medications for this visit.     Allergies:   Clonidine; Clonidine derivatives; Codeine; Meperidine; Meperidine hcl; Metformin; Motrin [ibuprofen]; Other; Penicillins; Demerol; Heparin; and Metformin and related    Social History:  The patient  reports that he quit smoking about 61 years ago. He has never used smokeless tobacco. He reports that he does not drink alcohol or use drugs.   Family History:  The patient's family history includes Coronary artery disease in his father; Diabetes in his mother.    ROS:  General:no colds or fevers, no weight changes Skin:no rashes or ulcers- scalp wound after biopsy last week by dermatology HEENT:no blurred vision, no congestion CV:see HPI PUL:see HPI GI:no diarrhea constipation or  melena, no indigestion GU:no hematuria, no dysuria MS:no joint pain, no claudication Neuro:no syncope, no lightheadedness Endo:+ diabetes, + thyroid disease  Wt Readings from Last 3 Encounters:  06/03/17 157 lb 3.2 oz (71.3 kg)  04/08/17 153 lb (69.4 kg)  03/16/17 154 lb (69.9 kg)     PHYSICAL EXAM: VS:  BP (!) 142/62   Pulse 68   Ht 5\' 7"  (1.702 m)   Wt 157 lb 3.2 oz (71.3 kg)   SpO2 98%   BMI 24.62 kg/m  , BMI Body mass index is  24.62 kg/m. Affect appropriate Elderly white male ambulates with walker  HEENT: normal Neck supple with no adenopathy JVP normal no bruits no thyromegaly Lungs clear with no wheezing and good diaphragmatic motion Heart:  S1/S2 SEM  murmur, no rub, gallop or click PMI normal pacer under left clavicle  Abdomen: benighn, BS positve, no tenderness, no AAA no bruit.  No HSM or HJR Distal pulses intact with no bruits No edema Neuro non-focal Skin warm and dry No muscular weakness     EKG:  01/12/17 junctional LBBB v pacing   Recent Labs: 07/30/2016: Magnesium 2.1 01/11/2017: B Natriuretic Peptide 1,870.9; TSH 1.610 01/12/2017: ALT 10 03/16/2017: BUN 19; Creatinine, Ser 1.48; Hemoglobin 12.8; Platelets 159; Potassium 4.8; Sodium 141    Lipid Panel No results found for: CHOL, TRIG, HDL, CHOLHDL, VLDL, LDLCALC, LDLDIRECT     Other studies Reviewed: Additional studies/ records that were reviewed today include: . Echo 07/30/16 Study Conclusions  - Left ventricle: The cavity size was normal. Systolic function was   severely reduced. The estimated ejection fraction was in the   range of 25% to 30%. Severe diffuse hypokinesis with no   identifiable regional variations. There is substantial systolic   dyssynchrony, both septal-to-lateral wall and apex-to-base.   Doppler parameters are consistent with restrictive physiology,   indicative of decreased left ventricular diastolic compliance   and/or increased left atrial pressure. Acoustic contrast    opacification revealed no evidence ofthrombus. - Ventricular septum: Septal motion showed abnormal function,   dyssynergy, and paradox. These changes are consistent with right   ventricular pacing. - Aortic valve: Noncoronary cusp mobility was severely restricted.   Transvalvular velocity was increased less than expected, due to   low cardiac output. There was mild stenosis. There was mild   regurgitation directed centrally in the LVOT. Valve area (VTI):   1.41 cm^2. - Mitral valve: Calcified annulus. There was mild to moderate   regurgitation directed centrally. - Left atrium: The atrium was moderately dilated. - Right ventricle: Systolic function was mildly reduced. - Tricuspid valve: There was moderate regurgitation. - Pulmonary arteries: Systolic pressure was moderately increased.   PA peak pressure: 63 mm Hg (S).   ASSESSMENT AND PLAN:  1. GI bleed: stable Hct 39 03/16/17 needs to see GI regarding f/u Cdiff and chronic diarrhea Using imodium regularly   2. CAD no angina on plavix with no aspirin due to GI bleeding given age conservative Rx avoiding cath if possible  3.  Chronic systolic and diastolic HF.  Better with demedex EF 25-30% TTE 07/30/16   4.  PAF has been SR, has PPM rate regular, no anticoagulation due to GI bleed.   5.  HTN Well controlled.  Continue current medications and low sodium Dash type diet.    6. PPM:  Normal function reviewed PaceArt f/u with Chanetta Marshall NP     Jenkins Rouge

## 2017-06-03 ENCOUNTER — Encounter: Payer: Self-pay | Admitting: Cardiovascular Disease

## 2017-06-03 ENCOUNTER — Ambulatory Visit: Payer: Medicare Other | Admitting: Cardiovascular Disease

## 2017-06-03 ENCOUNTER — Encounter (INDEPENDENT_AMBULATORY_CARE_PROVIDER_SITE_OTHER): Payer: Self-pay

## 2017-06-03 VITALS — BP 142/62 | HR 68 | Ht 67.0 in | Wt 157.2 lb

## 2017-06-03 DIAGNOSIS — I10 Essential (primary) hypertension: Secondary | ICD-10-CM

## 2017-06-03 DIAGNOSIS — I25810 Atherosclerosis of coronary artery bypass graft(s) without angina pectoris: Secondary | ICD-10-CM | POA: Diagnosis not present

## 2017-06-03 DIAGNOSIS — I5042 Chronic combined systolic (congestive) and diastolic (congestive) heart failure: Secondary | ICD-10-CM

## 2017-06-03 NOTE — Patient Instructions (Signed)

## 2017-07-12 ENCOUNTER — Telehealth: Payer: Self-pay | Admitting: Cardiology

## 2017-07-12 ENCOUNTER — Ambulatory Visit (INDEPENDENT_AMBULATORY_CARE_PROVIDER_SITE_OTHER): Payer: Medicare Other | Admitting: *Deleted

## 2017-07-12 DIAGNOSIS — I442 Atrioventricular block, complete: Secondary | ICD-10-CM | POA: Diagnosis not present

## 2017-07-12 NOTE — Telephone Encounter (Signed)
Spoke with pt and reminded pt of remote transmission that is due today. Pt verbalized understanding.   

## 2017-07-13 NOTE — Progress Notes (Signed)
Remote pacemaker transmission.   

## 2017-07-16 LAB — CUP PACEART REMOTE DEVICE CHECK
Brady Statistic AS VS Percent: 0 %
Date Time Interrogation Session: 20190708205303
Implantable Lead Implant Date: 20151208
Implantable Lead Location: 753859
Implantable Lead Location: 753860
Implantable Lead Model: 5092
Implantable Pulse Generator Implant Date: 20151208
Lead Channel Impedance Value: 617 Ohm
Lead Channel Pacing Threshold Amplitude: 1 V
Lead Channel Setting Pacing Amplitude: 2 V
Lead Channel Setting Pacing Amplitude: 2.5 V
Lead Channel Setting Pacing Pulse Width: 0.4 ms
Lead Channel Setting Sensing Sensitivity: 4 mV
MDC IDC LEAD IMPLANT DT: 20151208
MDC IDC MSMT BATTERY IMPEDANCE: 257 Ohm
MDC IDC MSMT BATTERY REMAINING LONGEVITY: 100 mo
MDC IDC MSMT BATTERY VOLTAGE: 2.79 V
MDC IDC MSMT LEADCHNL RA IMPEDANCE VALUE: 446 Ohm
MDC IDC MSMT LEADCHNL RA PACING THRESHOLD AMPLITUDE: 0.5 V
MDC IDC MSMT LEADCHNL RA PACING THRESHOLD PULSEWIDTH: 0.4 ms
MDC IDC MSMT LEADCHNL RV PACING THRESHOLD PULSEWIDTH: 0.4 ms
MDC IDC STAT BRADY AP VP PERCENT: 100 %
MDC IDC STAT BRADY AP VS PERCENT: 0 %
MDC IDC STAT BRADY AS VP PERCENT: 0 %

## 2017-07-26 ENCOUNTER — Telehealth: Payer: Self-pay

## 2017-07-26 NOTE — Telephone Encounter (Signed)
Received a message from covermymeds stating that the pts Entresto PA was canceled but gave no reason why.  I called [employer] at [phone] and s/w Myra P. who advised me that a PA is not required for this medication as it is on the pts plan of covered medications.

## 2017-07-26 NOTE — Telephone Encounter (Signed)
I have done an [place] PA through covermymeds. Key: [id number]

## 2017-07-27 NOTE — Telephone Encounter (Signed)
**Note De-Identified Clayton Bosserman Obfuscation** I have notified the pts pharmacy ([facility]) that this PA is not needed.

## 2017-08-02 ENCOUNTER — Other Ambulatory Visit: Payer: Self-pay | Admitting: Cardiology

## 2017-08-02 ENCOUNTER — Other Ambulatory Visit: Payer: Self-pay | Admitting: Cardiovascular Disease

## 2017-08-02 DIAGNOSIS — I480 Paroxysmal atrial fibrillation: Secondary | ICD-10-CM

## 2017-08-03 MED ORDER — TORSEMIDE 20 MG PO TABS: 180.0000 mg | ORAL_TABLET | Freq: Every day | ORAL | 2 refills | Status: DC

## 2017-08-03 MED ORDER — TORSEMIDE 20 MG PO TABS: 40.0000 mg | ORAL_TABLET | Freq: Every day | ORAL | 2 refills | Status: DC

## 2017-08-03 NOTE — Addendum Note (Signed)
Addended by: Derl Barrow on: 08/03/2017 04:21 PM   Modules accepted: Orders

## 2017-08-26 ENCOUNTER — Other Ambulatory Visit: Payer: Self-pay | Admitting: Cardiovascular Disease

## 2017-08-26 DIAGNOSIS — I5043 Acute on chronic combined systolic (congestive) and diastolic (congestive) heart failure: Secondary | ICD-10-CM

## 2017-10-11 ENCOUNTER — Encounter: Payer: Medicare Other | Admitting: *Deleted

## 2017-10-11 ENCOUNTER — Telehealth: Payer: Self-pay

## 2017-10-11 NOTE — Telephone Encounter (Signed)
Spoke with pt and reminded pt of remote transmission that is due today. Pt verbalized understanding.   

## 2017-10-20 ENCOUNTER — Encounter: Payer: Self-pay | Admitting: Cardiology

## 2017-11-19 NOTE — Progress Notes (Signed)
Cardiology Office Note   Date:  11/25/2017   ID:  Evan [name], DOB 1923/03/12, MRN [id number]  PCP:  Deland Pretty, MD  Cardiologist:  [name]     No chief complaint on file.     History of Present Illness:  84 y.o.  history of CAD status post CABG x5 in 1996, DES to SVG to OM 1/2 in 2015, LV dysfunction ejection fraction 25-30% 07/2016, PAF and PAT on amiodarone, PAD, complete heart block status post pacemaker, diabetes mellitus type 2 and hypothyroidism.  Patient has chronic weakness in the legs with atrophy and frequent falls.  He is not felt to be a good candidate for anticoagulation.  He had an admission 06/2016 with hematochezia and chest pain.  Hemoglobin was 8 and NSTEMI troponin peaked at 8.21.  He was not started on heparin.  Aspirin and Plavix were discontinued given active bleeding.  Readmitted 07/2016 with CHF Echo showed worsened ejection fraction 25-30%.  Norvasc was stopped and Entresto started.  Saw [name] 10/20/16 and had worsening lower extremity edema.  Lasix increased to 80 mg twice daily and Zaroxolyn 5 mg added Monday Wednesday Friday.  Creatinine 1.29 on 10/27/16. This has been stopped    Hospitalization 1/7 to 10/2017 with PAF no anticoagultion due to GI bleed.  Chronic systolic HF.  Demand ischemia .  pk troponin 3.05  Living at [facility] earlier in year Rx Has chronic dia rhea takes imodium every few days Sees Magod from [facility]  Past Medical History:  Diagnosis Date  . AC joint dislocation   . Allergic rhinitis   . Arthritis   . Atrial tachycardia ([place])   . BPH (benign prostatic hyperplasia)   . CAD   . CHF (congestive heart failure) ([place])   . DM    type 2  . GERD (gastroesophageal reflux disease)   . H/O: GI bleed   . HYPERCHOLESTEROLEMIA   . HYPERTENSION   . Hypothyroidism   . Iron deficiency anemia   . NEPHROLITHIASIS, HX OF   . Paroxysmal atrial fibrillation (HCC)   . Rib fracture   . TRANSIENT ISCHEMIC  ATTACKS, HX OF   . 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 Vertigo     Past Surgical History:  Procedure Laterality Date  . AV FISTULA REPAIR    . BACK SURGERY  2000'S  . CARDIAC CATHETERIZATION  12/11/2013   Procedure: CORONARY STENT INTERVENTION;  Surgeon: Burnell Blanks, MD;  Location: [facility] CATH LAB;  Service: Cardiovascular;;  DES 3.5 x 15 Resolute to seq SVG of OM1/OM2   . CORONARY ARTERY BYPASS GRAFT  1996  . CORONARY STENT PLACEMENT  12/11/13   SVG-OM DES  . FEMORAL ARTERY ANEURYSM REPAIR     feroral pseudo-aneurysm repair  . INSERT / REPLACE / REMOVE PACEMAKER    . LEFT HEART CATHETERIZATION WITH CORONARY/GRAFT ANGIOGRAM N/A 12/11/2013   Procedure: LEFT HEART CATHETERIZATION WITH Beatrix Fetters;  Surgeon: Burnell Blanks, MD;  Location: [facility] CATH LAB;  Service: Cardiovascular;  Laterality: N/A;  . PERMANENT PACEMAKER INSERTION N/A 12/12/2013   MDT ADDRL1 pacemaker implnated by Dr Rayann Heman  . PTCA  1980's     Current Outpatient Medications  Medication Sig Dispense Refill  . acetaminophen (TYLENOL) 325 MG tablet Take 650 mg by mouth every 6 (six) hours as needed for mild pain.    Marland Kitchen amiodarone (PACERONE) 200 MG tablet TAKE 1/2 TABLET BY MOUTH  EVERY DAY 45 tablet 3  . atorvastatin (LIPITOR) 10 MG tablet Take 1 tablet (10 mg total) by mouth daily. 30 tablet 0  . carvedilol (COREG) 12.5 MG tablet Take 1 tablet (12.5 mg total) by mouth 2 (two) times daily with a meal. 60 tablet 0  . clopidogrel (PLAVIX) 75 MG tablet TAKE 1 TABLET BY MOUTH EVERY DAY WITH BREAKFAST 90 tablet 2  . Cobalamine Combinations (VITAMIN B12-FOLIC ACID PO) Take 1 tablet by mouth every Monday, Wednesday, and Friday.    . divalproex (DEPAKOTE ER) 250 MG 24 hr tablet Take 1 tablet (250 mg total) by mouth at bedtime. 30 tablet 0  . ENTRESTO 24-26 MG TAKE 1 TABLET BY MOUTH 2 TIMES DAILY 60 tablet 9  . ferrous sulfate 324 (65 Fe) MG TBEC Take 1 tablet by mouth 2 (two) times  daily.    . finasteride (PROSCAR) 5 MG tablet Take 1 tablet (5 mg total) by mouth daily. 30 tablet 0  . folic acid (FOLVITE) 818 MCG tablet Take 400 mcg by mouth 3 (three) times a week. M, Wed,Fri    . isosorbide mononitrate (IMDUR) 30 MG 24 hr tablet TAKE 1 TABLET BY MOUTH EVERY DAY 90 tablet 3  . levothyroxine (SYNTHROID, LEVOTHROID) 112 MCG tablet Take 1 tablet (112 mcg total) by mouth daily before breakfast. 30 tablet 0  . Loperamide HCl (IMODIUM PO) Take by mouth as directed.    . meclizine (ANTIVERT) 25 MG tablet Take 25 mg by mouth 3 (three) times daily as needed.  2  . MELATONIN PO Take 10 mg by mouth at bedtime as needed.    . nitroGLYCERIN (NITROSTAT) 0.4 MG SL tablet PLACE 1 TABLET (0.4 MG TOTAL) UNDER THE TONGUE EVERY 5 (FIVE) MINUTES AS NEEDED FOR CHEST PAIN. 25 tablet 0  . saccharomyces boulardii (FLORASTOR) 250 MG capsule Take 1 capsule (250 mg total) by mouth 2 (two) times daily. 60 capsule 0  . sitaGLIPtin (JANUVIA) 25 MG tablet Take 1 tablet (25 mg total) by mouth daily. Take one tablet by mouth once daily to control blood sugar 30 tablet 0  . torsemide (DEMADEX) 20 MG tablet Take 2 tablets (40 mg total) by mouth daily. 180 tablet 2   No current facility-administered medications for this visit.     Allergies:   Clonidine; Clonidine derivatives; Codeine; Meperidine; Meperidine hcl; Metformin; Motrin [ibuprofen]; Other; Penicillins; Demerol; Heparin; and Metformin and related    Social History:  The patient  reports that he quit smoking about 61 years ago. He has never used smokeless tobacco. He reports that he does not drink alcohol or use drugs.   Family History:  The patient's family history includes Coronary artery disease in his father; Diabetes in his mother.    ROS:  General:no colds or fevers, no weight changes Skin:no rashes or ulcers- scalp wound after biopsy last week by dermatology HEENT:no blurred vision, no congestion CV:see HPI PUL:see HPI GI:no diarrhea  constipation or melena, no indigestion GU:no hematuria, no dysuria MS:no joint pain, no claudication Neuro:no syncope, no lightheadedness Endo:+ diabetes, + thyroid disease  Wt Readings from Last 3 Encounters:  11/25/17 163 lb 4 oz (74 kg)  06/03/17 157 lb 3.2 oz (71.3 kg)  04/08/17 153 lb (69.4 kg)     PHYSICAL EXAM: VS:  BP (!) 138/56   Pulse (!) 58   Ht 5\' 7"  (1.702 m)   Wt 163 lb 4 oz (74 kg) Comment: pt reports 160 at home  SpO2 97%  BMI 25.57 kg/m  , BMI Body mass index is 25.57 kg/m. Affect appropriate Elderly white male ambulates with walker  HEENT: normal Neck supple with no adenopathy JVP normal no bruits no thyromegaly Lungs clear with no wheezing and good diaphragmatic motion Heart:  S1/S2 SEM  murmur, no rub, gallop or click PMI normal pacer under left clavicle  Abdomen: benighn, BS positve, no tenderness, no AAA no bruit.  No HSM or HJR Distal pulses intact with no bruits No edema Neuro non-focal Skin warm and dry No muscular weakness     EKG:  01/12/17 junctional LBBB v pacing   Recent Labs: 01/11/2017: B Natriuretic Peptide 1,870.9; TSH 1.610 01/12/2017: ALT 10 03/16/2017: BUN 19; Creatinine, Ser 1.48; Hemoglobin 12.8; Platelets 159; Potassium 4.8; Sodium 141    Lipid Panel No results found for: CHOL, TRIG, HDL, CHOLHDL, VLDL, LDLCALC, LDLDIRECT     Other studies Reviewed: Additional studies/ records that were reviewed today include: . Echo 07/30/16 Study Conclusions  - Left ventricle: The cavity size was normal. Systolic function was   severely reduced. The estimated ejection fraction was in the   range of 25% to 30%. Severe diffuse hypokinesis with no   identifiable regional variations. There is substantial systolic   dyssynchrony, both septal-to-lateral wall and apex-to-base.   Doppler parameters are consistent with restrictive physiology,   indicative of decreased left ventricular diastolic compliance   and/or increased left atrial  pressure. Acoustic contrast   opacification revealed no evidence ofthrombus. - Ventricular septum: Septal motion showed abnormal function,   dyssynergy, and paradox. These changes are consistent with right   ventricular pacing. - Aortic valve: Noncoronary cusp mobility was severely restricted.   Transvalvular velocity was increased less than expected, due to   low cardiac output. There was mild stenosis. There was mild   regurgitation directed centrally in the LVOT. Valve area (VTI):   1.41 cm^2. - Mitral valve: Calcified annulus. There was mild to moderate   regurgitation directed centrally. - Left atrium: The atrium was moderately dilated. - Right ventricle: Systolic function was mildly reduced. - Tricuspid valve: There was moderate regurgitation. - Pulmonary arteries: Systolic pressure was moderately increased.   PA peak pressure: 63 mm Hg (S).   ASSESSMENT AND PLAN:  1. GI bleed: stable Hct 39 03/16/17 needs to see GI regarding f/u Cdiff and chronic diarrhea Using imodium regularly   2. CAD no angina on plavix with no aspirin due to GI bleeding given age conservative Rx avoiding cath if possible  3.  Chronic systolic and diastolic HF.  Better with demedex EF 25-30% TTE 07/30/16 Tolerating entresto with stable Cr   4.  PAF has been SR, has PPM rate regular, no anticoagulation due to GI bleed. Continue Amiodarone as AAT   5.  HTN Well controlled.  Continue current medications and low sodium Dash type diet.    6. PPM:  Normal function reviewed PaceArt f/u with Chanetta Marshall NP     Jenkins Rouge

## 2017-11-25 ENCOUNTER — Ambulatory Visit: Payer: Medicare Other | Admitting: Cardiovascular Disease

## 2017-11-25 ENCOUNTER — Encounter: Payer: Self-pay | Admitting: Cardiovascular Disease

## 2017-11-25 VITALS — BP 138/56 | HR 58 | Ht 67.0 in | Wt 163.2 lb

## 2017-11-25 DIAGNOSIS — I480 Paroxysmal atrial fibrillation: Secondary | ICD-10-CM

## 2017-11-25 DIAGNOSIS — I5042 Chronic combined systolic (congestive) and diastolic (congestive) heart failure: Secondary | ICD-10-CM

## 2017-11-25 DIAGNOSIS — I10 Essential (primary) hypertension: Secondary | ICD-10-CM

## 2017-11-25 DIAGNOSIS — Z950 Presence of cardiac pacemaker: Secondary | ICD-10-CM

## 2017-11-25 DIAGNOSIS — I25810 Atherosclerosis of coronary artery bypass graft(s) without angina pectoris: Secondary | ICD-10-CM

## 2017-11-25 NOTE — Patient Instructions (Signed)
Medication Instructions:   If you need a refill on your cardiac medications before your next appointment, please call your pharmacy.   Lab work:  If you have labs (blood work) drawn today and your tests are completely normal, you will receive your results only by: Marland Kitchen MyChart Message (if you have MyChart) OR . A paper copy in the mail If you have any lab test that is abnormal or we need to change your treatment, we will call you to review the results.  Testing/Procedures: NONE ordered.  Follow-Up: At [facility], you and your health needs are our priority.  As part of our continuing mission to provide you with exceptional heart care, we have created designated Provider Care Teams.  These Care Teams include your primary Cardiologist (physician) and Advanced Practice Providers (APPs -  Physician Assistants and Nurse Practitioners) who all work together to provide you with the care you need, when you need it. You will need a follow up appointment in 6 months.  Please call our office 2 months in advance to schedule this appointment.  You may see Jenkins Rouge, MD or one of the following Advanced Practice Providers on your designated Care Team:   Truitt Merle, NP Cecilie Kicks, NP . Kathyrn Drown, NP

## 2017-12-01 IMAGING — DX DG CHEST 2V
2 series · 2 of 2 positions shown · non-contrast
Comparison: 06/23/2016.

CLINICAL DATA: Chronic shortness of breath.  Hypoxia.

EXAM:
CHEST  2 VIEW

[chest lat]
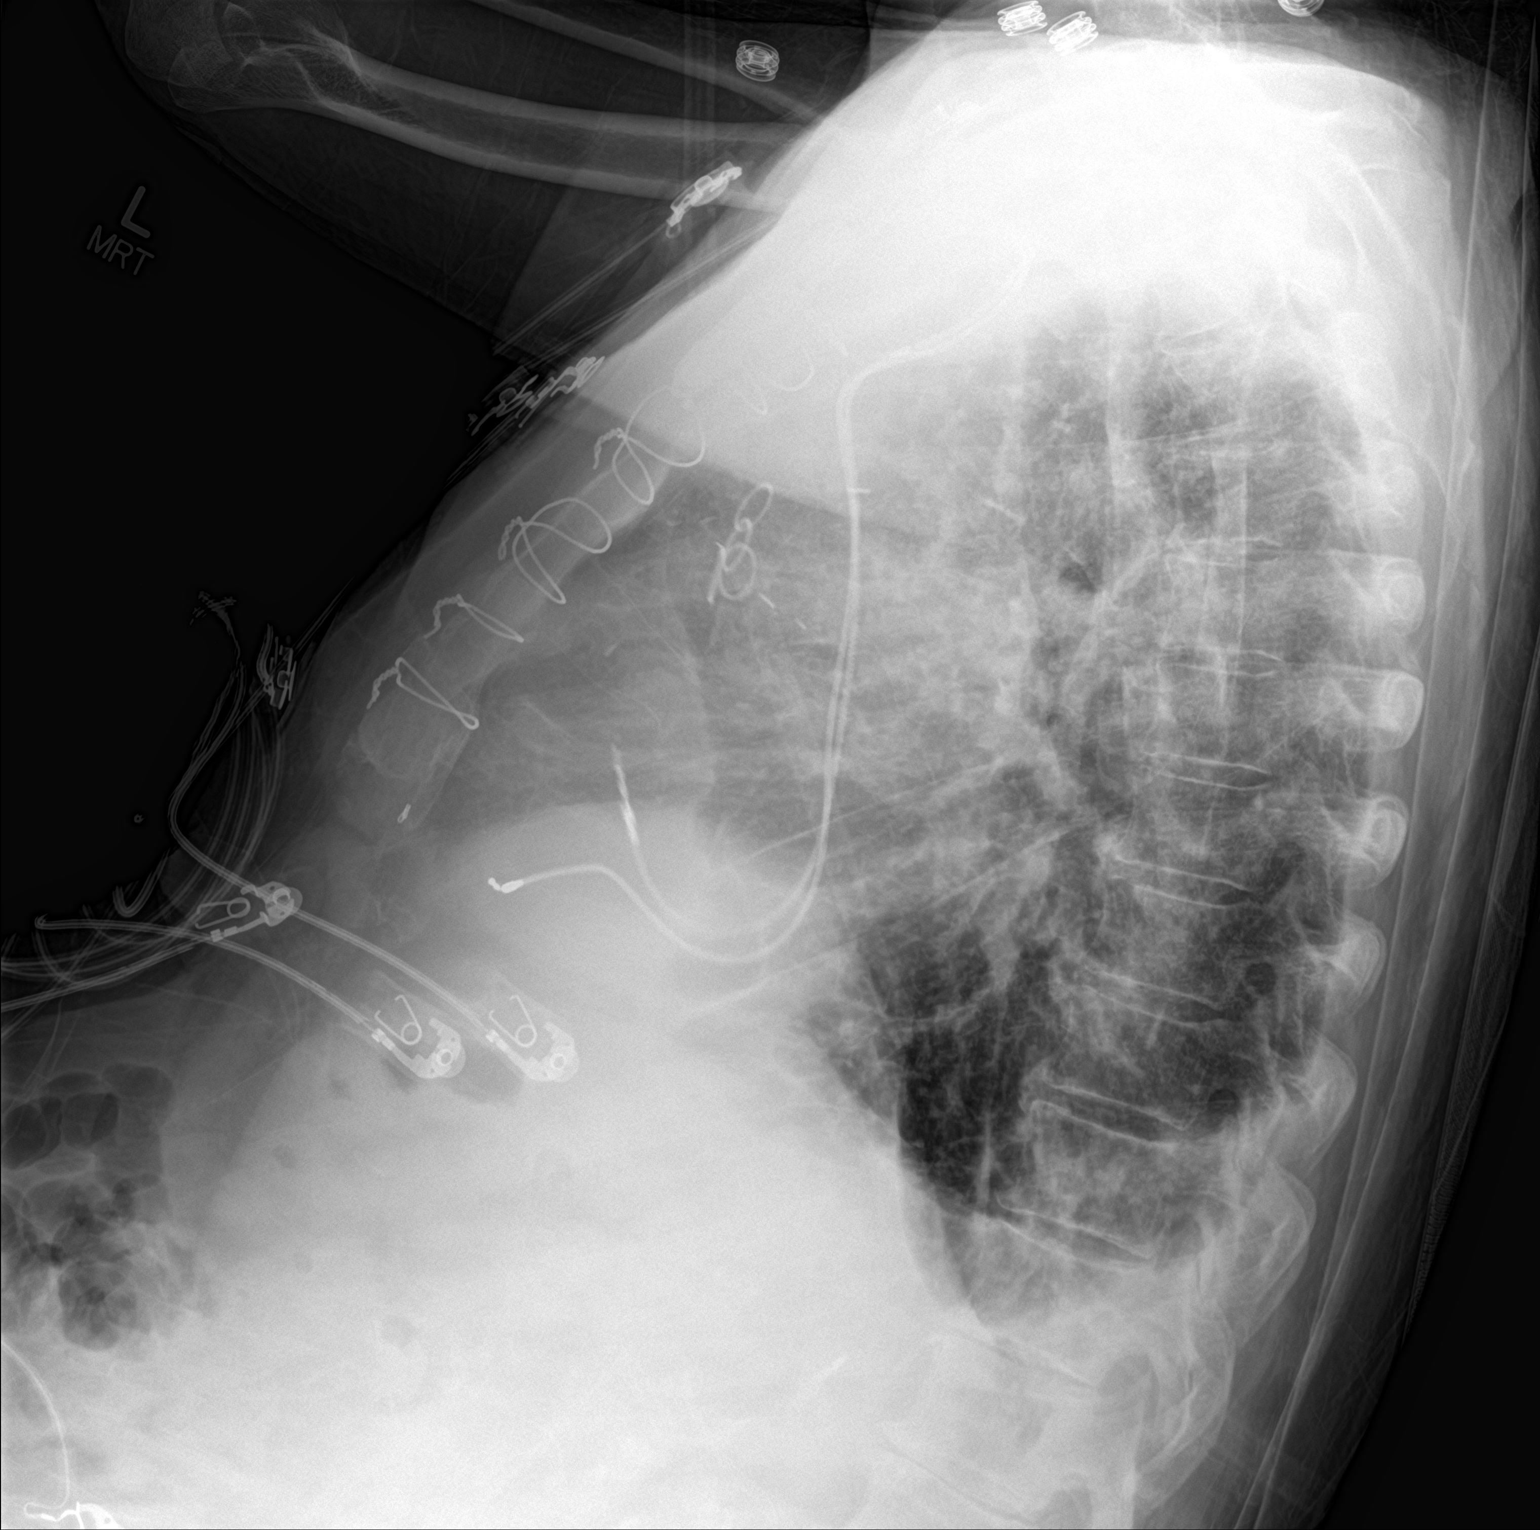

[chest ap]
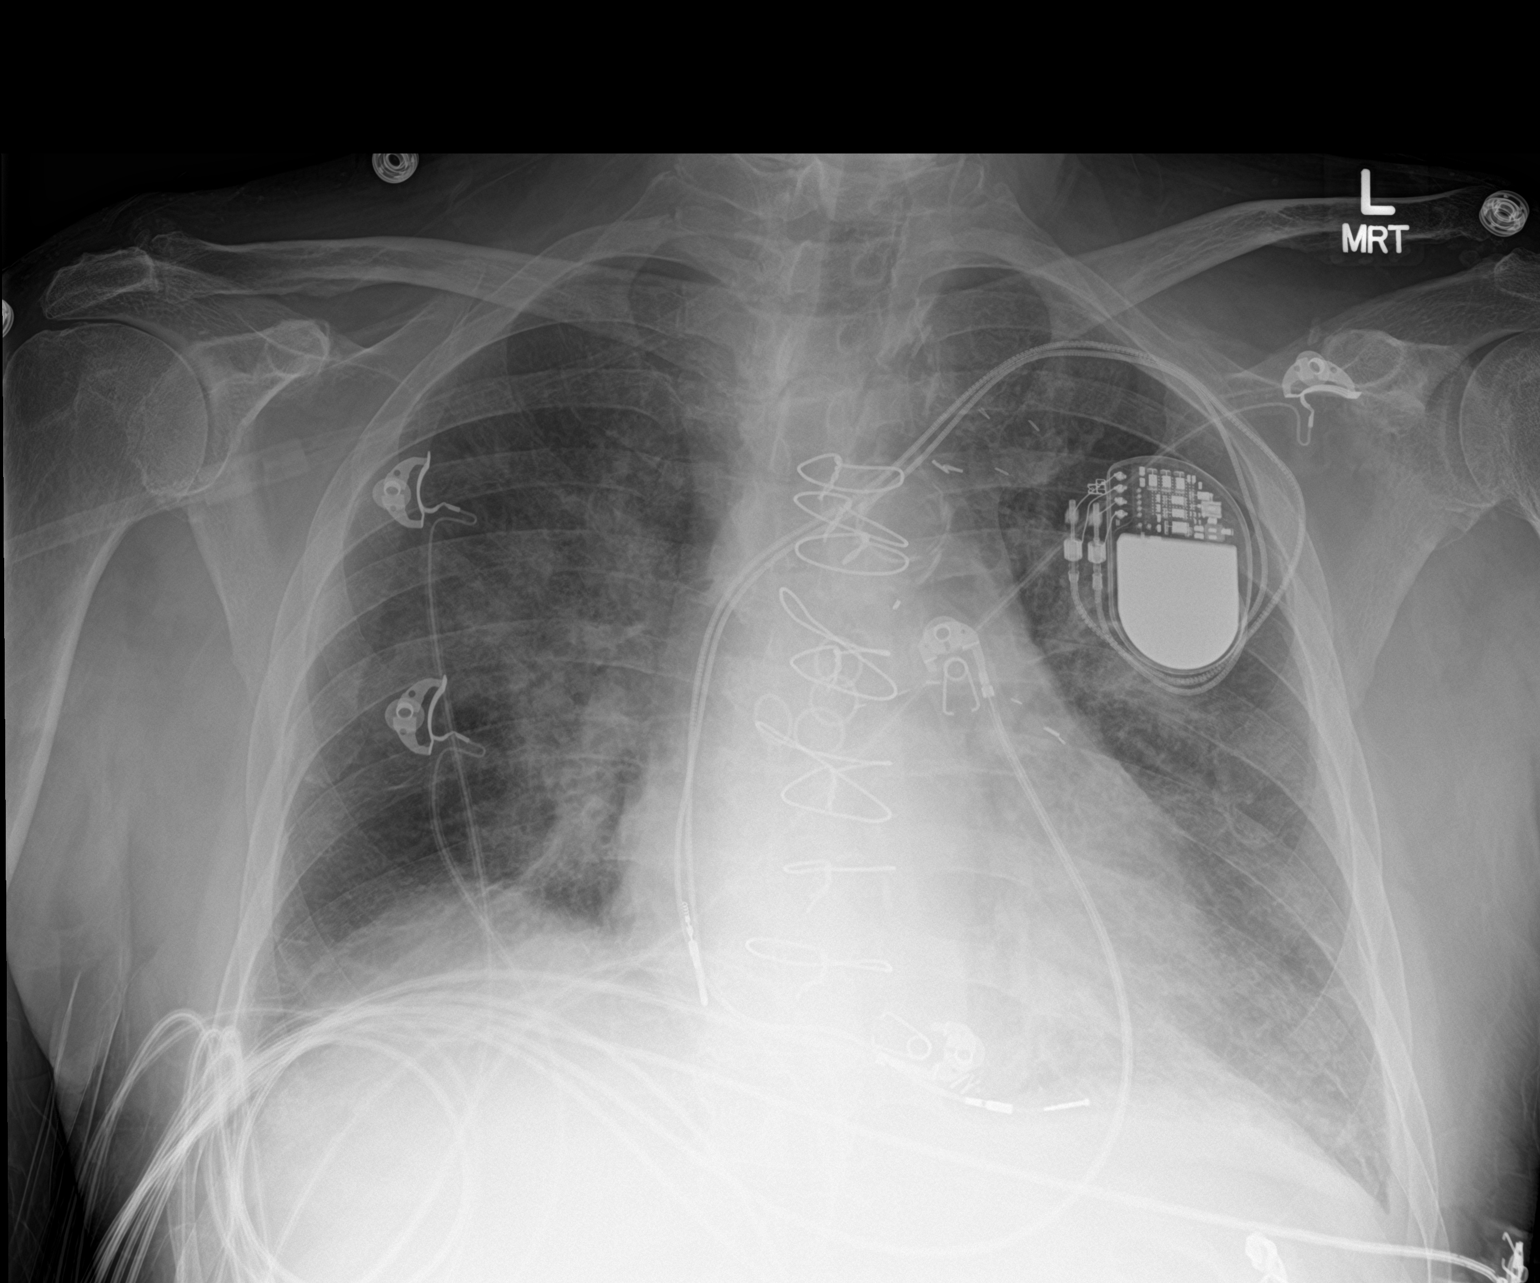

[2 of 2 positions shown; findings below may reference images not displayed]

FINDINGS: Cardiac pacer noted stable position. Prior CABG. Cardiomegaly again
noted. Progressive bilateral pulmonary pulmonary interstitial
infiltrates consistent pulmonary edema. Small bilateral pleural
effusions.
IMPRESSION: Cardiac pacer stable position. Prior CABG. Progressive changes of
congestive heart failure with bilateral pulmonary interstitial edema
and small pleural effusions .

## 2018-01-20 ENCOUNTER — Other Ambulatory Visit: Payer: Self-pay

## 2018-01-20 NOTE — Patient Outreach (Signed)
  [facility]) Care Management Chronic Special Needs Program  01/20/2018  Name: Evan [name] DOB: 1923-08-17  MRN: [id number]  Mr. Evan [name] is enrolled in a chronic special needs plan. RNCM called to review completed Health Risk Assessment and complete individualized care plan. No answer. HIPPA compliant voice message left.  Plan: RNCM will outreach next week if no return call.  Thea Silversmith, RN, MSN, [place] [phone]

## 2018-01-24 ENCOUNTER — Other Ambulatory Visit: Payer: Self-pay

## 2018-01-24 NOTE — Patient Outreach (Signed)
  [facility]) Care Management Chronic Special Needs Program    01/24/2018  Name: Evan [name], DOB: [birth year]  MRN: [id number]   Mr. Evan [name] is enrolled in a chronic special needs plan.   RNCM called to review completed Health Risk Assessment and complete individualized care plan. No answer. HIPPA compliant voice message left. 2nd outreach attempt.  Plan: RNCM will outreach next week if no return call.  Thea Silversmith, RN, MSN, [place] [phone]

## 2018-01-26 ENCOUNTER — Other Ambulatory Visit: Payer: Self-pay

## 2018-01-26 NOTE — Patient Outreach (Signed)
  [facility]) Care Management Chronic Special Needs Program   01/26/2018  Name: Evan [name], DOB: 13-Jul-1923  MRN: [id number]  The client was discussed in today's interdisciplinary care team meeting.  The following issues were discussed:  Key risk triggers/risk stratification  Participants present:  Thea Silversmith, MSN, RN, CCM; Peter Garter RN, BSN, CCM, CDE; Dr. Maryella Shivers; Dr. Marco Collie  Plan:  Continue Outreach Attempts.  Thea Silversmith, RN, MSN, [place] [phone]

## 2018-01-31 ENCOUNTER — Telehealth: Payer: Self-pay

## 2018-01-31 ENCOUNTER — Other Ambulatory Visit: Payer: Self-pay

## 2018-01-31 NOTE — Telephone Encounter (Signed)
I have done an [place] PA through covermymeds. Key: [id number]

## 2018-01-31 NOTE — Patient Outreach (Addendum)
  [facility]) Care Management Chronic Special Needs Program  01/31/2018  Name: QUAVON KEISLING DOB: 1923-10-31  MRN: [id number]  Mr. Ludger Thomann is enrolled in a chronic special needs plan for Heart Failure. Client has not responded to three outreach attempts.  The client's individualized care plan was developed based upon the completed health risk assessment.  Plan:   . Send unsuccessful outreach letter with a copy of individualized care plan to client . Send individualized care plan to provider . Refer to [facility] for medication review. Client is on greater than 10 prescription medications . Refer to [place] risk assessment-having trouble obtaining food. . Chronic care management coordinator will attempt outreach in 3 Months.   Thea Silversmith, RN, MSN, [place] [phone]  Addendum: Send Education Material: Fall prevention brochure; Heart failure action plan

## 2018-01-31 NOTE — Telephone Encounter (Signed)
The following message was received from covermymesd: This request has received a Cancelled outcome.  This may mean either your patient does not have active coverage with this plan, this authorization was processed as a duplicate request, or an authorization was not needed for this medication.  We received the same message on 07/26/2017 (see phone note). This PA may not be required.

## 2018-02-01 NOTE — Telephone Encounter (Signed)
**Note De-Identified Roczen Waymire Obfuscation** Letter received from [facility] stating that the pts insurance coverage ran out on 01/04/2018.

## 2018-02-08 DIAGNOSIS — L570 Actinic keratosis: Secondary | ICD-10-CM | POA: Diagnosis not present

## 2018-02-08 DIAGNOSIS — Z85828 Personal history of other malignant neoplasm of skin: Secondary | ICD-10-CM | POA: Diagnosis not present

## 2018-02-18 ENCOUNTER — Telehealth: Payer: Self-pay | Admitting: Cardiovascular Disease

## 2018-02-18 NOTE — Telephone Encounter (Signed)
**Note De-Identified Cherron Blitzer Obfuscation** I have done an urgent Entresto PA through covermymeds. Key: [id number]

## 2018-02-18 NOTE — Telephone Encounter (Signed)
Pharmacy is calling for prior auth for Entresto.

## 2018-02-18 NOTE — Telephone Encounter (Addendum)
**Note De-Identified Axcel Horsch Obfuscation** Message received from covermymeds: Denney Wilczynski (Key: [id number]) Rx #: [id number] [name] 24-26MG  tablets   Form [employer] 4-Part NCPDP Electronic PA Form  Favorable 22 minutes ago Message from Plan PA Case: [id number], Status: Approved, Coverage Starts on: 02/18/2018 12:00:00 AM, Coverage Ends on: 02/18/2019 12:00:00 AM.  I have notified [place] of this approval.

## 2018-03-03 ENCOUNTER — Ambulatory Visit: Payer: HMO

## 2018-03-03 ENCOUNTER — Ambulatory Visit (INDEPENDENT_AMBULATORY_CARE_PROVIDER_SITE_OTHER): Payer: HMO

## 2018-03-03 ENCOUNTER — Ambulatory Visit: Payer: HMO | Admitting: Podiatry

## 2018-03-03 VITALS — BP 180/69 | HR 64

## 2018-03-03 DIAGNOSIS — M7732 Calcaneal spur, left foot: Secondary | ICD-10-CM | POA: Diagnosis not present

## 2018-03-03 DIAGNOSIS — M216X9 Other acquired deformities of unspecified foot: Secondary | ICD-10-CM | POA: Diagnosis not present

## 2018-03-03 DIAGNOSIS — L600 Ingrowing nail: Secondary | ICD-10-CM | POA: Diagnosis not present

## 2018-03-03 DIAGNOSIS — M7662 Achilles tendinitis, left leg: Secondary | ICD-10-CM | POA: Diagnosis not present

## 2018-03-03 DIAGNOSIS — M722 Plantar fascial fibromatosis: Secondary | ICD-10-CM | POA: Diagnosis not present

## 2018-03-03 NOTE — Patient Instructions (Signed)
Plantar Fasciitis  Plantar fasciitis is a painful foot condition that affects the heel. It occurs when the band of tissue that connects the toes to the heel bone (plantar fascia) becomes irritated. This can happen as the result of exercising too much or doing other repetitive activities (overuse injury). The pain from plantar fasciitis can range from mild irritation to severe pain that makes it difficult to walk or move. The pain is usually worse in the morning after sleeping, or after sitting or lying down for a while. Pain may also be worse after long periods of walking or standing. What are the causes? This condition may be caused by:  Standing for long periods of time.  Wearing shoes that do not have good arch support.  Doing activities that put stress on joints (high-impact activities), including running, aerobics, and ballet.  Being overweight.  An abnormal way of walking (gait).  Tight muscles in the back of your lower leg (calf).  High arches in your feet.  Starting a new athletic activity. What are the signs or symptoms? The main symptom of this condition is heel pain. Pain may:  Be worse with first steps after a time of rest, especially in the morning after sleeping or after you have been sitting or lying down for a while.  Be worse after long periods of standing still.  Decrease after 30-45 minutes of activity, such as gentle walking. How is this diagnosed? This condition may be diagnosed based on your medical history and your symptoms. Your health care provider may ask questions about your activity level. Your health care provider will do a physical exam to check for:  A tender area on the bottom of your foot.  A high arch in your foot.  Pain when you move your foot.  Difficulty moving your foot. You may have imaging tests to confirm the diagnosis, such as:  X-rays.  Ultrasound.  MRI. How is this treated? Treatment for plantar fasciitis depends on how  severe your condition is. Treatment may include:  Rest, ice, applying pressure (compression), and raising the affected foot (elevation). This may be called RICE therapy. Your health care provider may recommend RICE therapy along with over-the-counter pain medicines to manage your pain.  Exercises to stretch your calves and your plantar fascia.  A splint that holds your foot in a stretched, upward position while you sleep (night splint).  Physical therapy to relieve symptoms and prevent problems in the future.  Injections of steroid medicine (cortisone) to relieve pain and inflammation.  Stimulating your plantar fascia with electrical impulses (extracorporeal shock wave therapy). This is usually the last treatment option before surgery.  Surgery, if other treatments have not worked after 12 months. Follow these instructions at home:  Managing pain, stiffness, and swelling  If directed, put ice on the painful area: ? Put ice in a plastic bag, or use a frozen bottle of water. ? Place a towel between your skin and the bag or bottle. ? Roll the bottom of your foot over the bag or bottle. ? Do this for 20 minutes, 2-3 times a day.  Wear athletic shoes that have air-sole or gel-sole cushions, or try wearing soft shoe inserts that are designed for plantar fasciitis.  Raise (elevate) your foot above the level of your heart while you are sitting or lying down. Activity  Avoid activities that cause pain. Ask your health care provider what activities are safe for you.  Do physical therapy exercises and stretches as told   by your health care provider.  Try activities and forms of exercise that are easier on your joints (low-impact). Examples include swimming, water aerobics, and biking. General instructions  Take over-the-counter and prescription medicines only as told by your health care provider.  Wear a night splint while sleeping, if told by your health care provider. Loosen the splint  if your toes tingle, become numb, or turn cold and blue.  Maintain a healthy weight, or work with your health care provider to lose weight as needed.  Keep all follow-up visits as told by your health care provider. This is important. Contact a health care provider if you:  Have symptoms that do not go away after caring for yourself at home.  Have pain that gets worse.  Have pain that affects your ability to move or do your daily activities. Summary  Plantar fasciitis is a painful foot condition that affects the heel. It occurs when the band of tissue that connects the toes to the heel bone (plantar fascia) becomes irritated.  The main symptom of this condition is heel pain that may be worse after exercising too much or standing still for a long time.  Treatment varies, but it usually starts with rest, ice, compression, and elevation (RICE therapy) and over-the-counter medicines to manage pain. This information is not intended to replace advice given to you by your health care provider. Make sure you discuss any questions you have with your health care provider. Document Released: 09/16/2000 Document Revised: 10/19/2016 Document Reviewed: 10/19/2016 Elsevier Interactive Patient Education  2019 [employer].   For instructions on how to put on your Night Splint, please visit [email or web address]

## 2018-03-03 NOTE — Progress Notes (Signed)
Subjective:  Patient ID: Evan [name], male    DOB: Sep 27, 1923,  MRN: [id number]  Chief Complaint  Patient presents with  . Foot Pain    Pt states left posterior heel pain as well as plantar, duration 42months, pain is worst in the morning, pain with walking, no known injury.  . Nail Problem    Left 1st ingrown nail 3wk duration, tender no drainage.    85 y.o. male presents with the above complaint. History as above.  Review of Systems: Negative except as noted in the HPI. Denies N/V/F/Ch.  Past Medical History:  Diagnosis Date  . AC joint dislocation   . Allergic rhinitis   . Arthritis   . Atrial tachycardia ([place])   . BPH (benign prostatic hyperplasia)   . CAD   . CHF (congestive heart failure) ([place])   . DM    type 2  . GERD (gastroesophageal reflux disease)   . H/O: GI bleed   . HYPERCHOLESTEROLEMIA   . HYPERTENSION   . Hypothyroidism   . Iron deficiency anemia   . NEPHROLITHIASIS, HX OF   . Paroxysmal atrial fibrillation ([facility])   . Rib fracture   . TRANSIENT ISCHEMIC ATTACKS, HX OF   . 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 Vertigo     Current Outpatient Medications:  .  acetaminophen (TYLENOL) 325 MG tablet, Take 650 mg by mouth every 6 (six) hours as needed for mild pain., Disp: , Rfl:  .  amiodarone (PACERONE) 200 MG tablet, TAKE 1/2 TABLET BY MOUTH EVERY DAY, Disp: 45 tablet, Rfl: 3 .  atorvastatin (LIPITOR) 10 MG tablet, Take 1 tablet (10 mg total) by mouth daily., Disp: 30 tablet, Rfl: 0 .  carvedilol (COREG) 12.5 MG tablet, Take 1 tablet (12.5 mg total) by mouth 2 (two) times daily with a meal., Disp: 60 tablet, Rfl: 0 .  clopidogrel (PLAVIX) 75 MG tablet, TAKE 1 TABLET BY MOUTH EVERY DAY WITH BREAKFAST, Disp: 90 tablet, Rfl: 2 .  Cobalamine Combinations (VITAMIN B12-FOLIC ACID PO), Take 1 tablet by mouth every Monday, Wednesday, and Friday., Disp: , Rfl:  .  divalproex (DEPAKOTE ER) 250 MG 24 hr tablet, Take 1 tablet (250 mg  total) by mouth at bedtime., Disp: 30 tablet, Rfl: 0 .  ENTRESTO 24-26 MG, TAKE 1 TABLET BY MOUTH 2 TIMES DAILY, Disp: 60 tablet, Rfl: 9 .  ferrous sulfate 324 (65 Fe) MG TBEC, Take 1 tablet by mouth 2 (two) times daily., Disp: , Rfl:  .  finasteride (PROSCAR) 5 MG tablet, Take 1 tablet (5 mg total) by mouth daily., Disp: 30 tablet, Rfl: 0 .  folic acid (FOLVITE) 643 MCG tablet, Take 400 mcg by mouth 3 (three) times a week. M, Wed,Fri, Disp: , Rfl:  .  isosorbide mononitrate (IMDUR) 30 MG 24 hr tablet, TAKE 1 TABLET BY MOUTH EVERY DAY, Disp: 90 tablet, Rfl: 3 .  levothyroxine (SYNTHROID, LEVOTHROID) 112 MCG tablet, Take 1 tablet (112 mcg total) by mouth daily before breakfast., Disp: 30 tablet, Rfl: 0 .  Loperamide HCl (IMODIUM PO), Take by mouth as directed., Disp: , Rfl:  .  meclizine (ANTIVERT) 25 MG tablet, Take 25 mg by mouth 3 (three) times daily as needed., Disp: , Rfl: 2 .  MELATONIN PO, Take 10 mg by mouth at bedtime as needed., Disp: , Rfl:  .  nitroGLYCERIN (NITROSTAT) 0.4 MG SL tablet, PLACE 1 TABLET (0.4 MG TOTAL) UNDER THE TONGUE EVERY 5 (FIVE)  MINUTES AS NEEDED FOR CHEST PAIN., Disp: 25 tablet, Rfl: 0 .  saccharomyces boulardii (FLORASTOR) 250 MG capsule, Take 1 capsule (250 mg total) by mouth 2 (two) times daily., Disp: 60 capsule, Rfl: 0 .  sitaGLIPtin (JANUVIA) 25 MG tablet, Take 1 tablet (25 mg total) by mouth daily. Take one tablet by mouth once daily to control blood sugar, Disp: 30 tablet, Rfl: 0 .  torsemide (DEMADEX) 20 MG tablet, Take 2 tablets (40 mg total) by mouth daily., Disp: 180 tablet, Rfl: 2  Social History   Tobacco Use  Smoking Status Former Smoker  . Last attempt to quit: 01/06/1956  . Years since quitting: 62.1  Smokeless Tobacco Never Used    Allergies  Allergen Reactions  . Clonidine   . Clonidine Derivatives     unknown reaction   . Codeine     unknown reaction   . Meperidine   . Meperidine Hcl     REACTION: Hypotension  . Metformin   .  Motrin [Ibuprofen]     unknown reaction   . Other     Celery - unknown reaction  . Penicillins     unknown reaction   . Demerol Other (See Comments)    hypotension  . Heparin Other (See Comments)    hypotension  . Metformin And Related Diarrhea   Objective:   Vitals:   03/03/18 0933  BP: (!) 180/69  Pulse: 64   There is no height or weight on file to calculate BMI. Constitutional Well developed. Well nourished.  Vascular Dorsalis pedis pulses faintly palpable bilaterally. Posterior tibial pulses faintly palpable bilaterally. Capillary refill normal to all digits.  No cyanosis or clubbing noted. Pedal hair growth normal.  Neurologic Normal speech. Oriented to person, place, and time. Epicritic sensation to light touch grossly present bilaterally.  Dermatologic  ingrown nail medial aspect of the first toe left foot with pain palpation No open wounds. No skin lesions.  Orthopedic: Normal joint ROM without pain or crepitus bilaterally. No visible deformities. Tender to palpation at the calcaneal tuber left. No pain with calcaneal squeeze left. Ankle ROM diminished range with pain left. Pain palpation about the posterior Achilles tendon Silfverskiold Test: positive left.   Radiographs: Taken and reviewed. No acute fractures or dislocations. No evidence of stress fracture.  Plantar heel spur absent. Posterior heel spur present.   Assessment:   1. Plantar fasciitis, left   2. Ingrown nail   3. Achilles tendinitis of left lower extremity   4. Calcaneal spur of left foot   5. Equinus deformity of foot    Plan:  Patient was evaluated and treated and all questions answered.  Plantar Fasciitis, left, Achilles Tendonitis - XR reviewed as above.  - Educated on icing and stretching. Instructions given.  - Injection delivered to the plantar fascia as below. - DME: Night splint dispensed. - Pharmacologic management: Meloxicam. Educated on risks/benefits and proper taking  of medication.  Procedure: Injection Tendon/Ligament Location: Right plantar fascia at the glabrous junction; medial approach. Skin Prep: alcohol Injectate: 1 cc 0.5% marcaine plain, 1 cc dexamethasone phosphate, 0.5 cc kenalog 10. Disposition: Patient tolerated procedure well. Injection site dressed with a band-aid.  Ingrown Nail, left -Patient elects to proceed with ingrown toenail removal today -Ingrown nail excised. See procedure note. -Educated on post-procedure care including soaking. Written instructions provided. -Patient to follow up in 2 weeks for nail check.  Procedure: Excision of Ingrown Toenail Location: Left 1st toe medial nail borders. Anesthesia: Lidocaine 1% plain;  1.64mL and Marcaine 0.5% plain; 1.35mL, digital block. Skin Prep: Betadine. Dressing: Silvadene; telfa; dry, sterile, compression dressing. Technique: Following skin preparation and English anvil was used to split the nail at the medial aspect of of the left great toe fully down to the base.  The nail was avulsed.  No tourniquet was used due to the patient's peripheral arterial disease disposition: Patient tolerated procedure well. Patient to return in 2 weeks for follow-up.    Return in about 2 weeks (around 03/17/2018) for Routine Foot Care .

## 2018-03-11 ENCOUNTER — Other Ambulatory Visit: Payer: Self-pay | Admitting: Cardiology

## 2018-03-17 ENCOUNTER — Ambulatory Visit: Payer: HMO | Admitting: Podiatry

## 2018-03-17 ENCOUNTER — Other Ambulatory Visit: Payer: Self-pay

## 2018-03-17 DIAGNOSIS — M722 Plantar fascial fibromatosis: Secondary | ICD-10-CM

## 2018-03-17 DIAGNOSIS — L600 Ingrowing nail: Secondary | ICD-10-CM

## 2018-03-22 DIAGNOSIS — H353221 Exudative age-related macular degeneration, left eye, with active choroidal neovascularization: Secondary | ICD-10-CM | POA: Diagnosis not present

## 2018-03-22 DIAGNOSIS — E119 Type 2 diabetes mellitus without complications: Secondary | ICD-10-CM | POA: Diagnosis not present

## 2018-03-22 DIAGNOSIS — H353113 Nonexudative age-related macular degeneration, right eye, advanced atrophic without subfoveal involvement: Secondary | ICD-10-CM | POA: Diagnosis not present

## 2018-03-22 DIAGNOSIS — Z961 Presence of intraocular lens: Secondary | ICD-10-CM | POA: Diagnosis not present

## 2018-04-05 NOTE — Progress Notes (Addendum)
Subjective:  Patient ID: Evan [name], male    DOB: [birth year],  MRN: [id number]  No chief complaint on file.   85 y.o. male presents with the above complaint. History as above.  Review of Systems: Negative except as noted in the HPI. Denies N/V/F/Ch.  Past Medical History:  Diagnosis Date  . AC joint dislocation   . Allergic rhinitis   . Arthritis   . Atrial tachycardia ([place])   . BPH (benign prostatic hyperplasia)   . CAD   . CHF (congestive heart failure) ([place])   . DM    type 2  . GERD (gastroesophageal reflux disease)   . H/O: GI bleed   . HYPERCHOLESTEROLEMIA   . HYPERTENSION   . Hypothyroidism   . Iron deficiency anemia   . NEPHROLITHIASIS, HX OF   . Paroxysmal atrial fibrillation ([facility])   . Rib fracture   . TRANSIENT ISCHEMIC ATTACKS, HX OF   . 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 Vertigo     Current Outpatient Medications:  .  acetaminophen (TYLENOL) 325 MG tablet, Take 650 mg by mouth every 6 (six) hours as needed for mild pain., Disp: , Rfl:  .  amiodarone (PACERONE) 200 MG tablet, TAKE 1/2 TABLET BY MOUTH EVERY DAY, Disp: 45 tablet, Rfl: 3 .  atorvastatin (LIPITOR) 10 MG tablet, Take 1 tablet (10 mg total) by mouth daily., Disp: 30 tablet, Rfl: 0 .  carvedilol (COREG) 12.5 MG tablet, Take 1 tablet (12.5 mg total) by mouth 2 (two) times daily with a meal., Disp: 60 tablet, Rfl: 0 .  clopidogrel (PLAVIX) 75 MG tablet, TAKE 1 TABLET BY MOUTH EVERY DAY WITH BREAKFAST, Disp: 90 tablet, Rfl: 2 .  Cobalamine Combinations (VITAMIN B12-FOLIC ACID PO), Take 1 tablet by mouth every Monday, Wednesday, and Friday., Disp: , Rfl:  .  divalproex (DEPAKOTE ER) 250 MG 24 hr tablet, Take 1 tablet (250 mg total) by mouth at bedtime., Disp: 30 tablet, Rfl: 0 .  ENTRESTO 24-26 MG, TAKE 1 TABLET BY MOUTH 2 TIMES DAILY, Disp: 60 tablet, Rfl: 9 .  ferrous sulfate 324 (65 Fe) MG TBEC, Take 1 tablet by mouth 2 (two) times daily., Disp: , Rfl:  .   finasteride (PROSCAR) 5 MG tablet, Take 1 tablet (5 mg total) by mouth daily., Disp: 30 tablet, Rfl: 0 .  folic acid (FOLVITE) 025 MCG tablet, Take 400 mcg by mouth 3 (three) times a week. M, Wed,Fri, Disp: , Rfl:  .  isosorbide mononitrate (IMDUR) 30 MG 24 hr tablet, TAKE 1 TABLET BY MOUTH EVERY DAY, Disp: 90 tablet, Rfl: 3 .  levothyroxine (SYNTHROID, LEVOTHROID) 112 MCG tablet, Take 1 tablet (112 mcg total) by mouth daily before breakfast., Disp: 30 tablet, Rfl: 0 .  Loperamide HCl (IMODIUM PO), Take by mouth as directed., Disp: , Rfl:  .  meclizine (ANTIVERT) 25 MG tablet, Take 25 mg by mouth 3 (three) times daily as needed., Disp: , Rfl: 2 .  MELATONIN PO, Take 10 mg by mouth at bedtime as needed., Disp: , Rfl:  .  nitroGLYCERIN (NITROSTAT) 0.4 MG SL tablet, PLACE 1 TABLET (0.4 MG TOTAL) UNDER THE TONGUE EVERY 5 (FIVE) MINUTES AS NEEDED FOR CHEST PAIN., Disp: 25 tablet, Rfl: 0 .  saccharomyces boulardii (FLORASTOR) 250 MG capsule, Take 1 capsule (250 mg total) by mouth 2 (two) times daily., Disp: 60 capsule, Rfl: 0 .  sitaGLIPtin (JANUVIA) 25 MG tablet, Take 1 tablet (25 mg total)  by mouth daily. Take one tablet by mouth once daily to control blood sugar, Disp: 30 tablet, Rfl: 0 .  torsemide (DEMADEX) 20 MG tablet, Take 2 tablets (40 mg total) by mouth daily., Disp: 180 tablet, Rfl: 1  Social History   Tobacco Use  Smoking Status Former Smoker  . Last attempt to quit: 01/06/1956  . Years since quitting: 62.2  Smokeless Tobacco Never Used    Allergies  Allergen Reactions  . Clonidine   . Clonidine Derivatives     unknown reaction   . Codeine     unknown reaction   . Meperidine   . Meperidine Hcl     REACTION: Hypotension  . Metformin   . Motrin [Ibuprofen]     unknown reaction   . Other     Celery - unknown reaction  . Penicillins     unknown reaction   . Demerol Other (See Comments)    hypotension  . Heparin Other (See Comments)    hypotension  . Metformin And Related  Diarrhea   Objective:   There were no vitals filed for this visit. There is no height or weight on file to calculate BMI. Constitutional Well developed. Well nourished.  Vascular Dorsalis pedis pulses faintly palpable bilaterally. Posterior tibial pulses faintly palpable bilaterally. Capillary refill normal to all digits.  No cyanosis or clubbing noted. Pedal hair growth normal.  Neurologic Normal speech. Oriented to person, place, and time. Epicritic sensation to light touch grossly present bilaterally.  Dermatologic Nail site healing well without issue. No open wounds. No skin lesions.  Orthopedic: Normal joint ROM without pain or crepitus bilaterally. No visible deformities. Tender to palpation at the calcaneal tuber left. No pain with calcaneal squeeze left. Ankle ROM diminished range with pain left. Pain palpation about the posterior Achilles tendon Silfverskiold Test: positive left.   Radiographs: Taken and reviewed. No acute fractures or dislocations. No evidence of stress fracture.  Plantar heel spur absent. Posterior heel spur present.   Assessment:   1. Plantar fasciitis, left   2. Ingrown nail    Plan:  Patient was evaluated and treated and all questions answered.  Plantar Fasciitis, left, Achilles Tendonitis - Continue stretchi and icing - DME: PF brace dispensed.  Procedure: Injection Tendon/Ligament Consent: Verbal consent obtained. Location: Left plantar fascia at the glabrous junction; medial approach. Skin Prep: Alcohol. Injectate: 1 cc 0.5% marcaine plain, 1 cc dexamethasone phosphate, 0.5 cc kenalog 10. Disposition: Patient tolerated procedure well. Injection site dressed with a band-aid.     Ingrown nail -Healing without issue. No follow-ups on file.

## 2018-04-07 ENCOUNTER — Encounter: Payer: Self-pay | Admitting: Podiatry

## 2018-04-07 ENCOUNTER — Telehealth: Payer: Self-pay | Admitting: *Deleted

## 2018-04-07 ENCOUNTER — Ambulatory Visit: Payer: HMO | Admitting: Podiatry

## 2018-04-07 ENCOUNTER — Other Ambulatory Visit: Payer: Self-pay

## 2018-04-07 VITALS — Temp 97.2°F

## 2018-04-07 DIAGNOSIS — G629 Polyneuropathy, unspecified: Secondary | ICD-10-CM

## 2018-04-07 DIAGNOSIS — M722 Plantar fascial fibromatosis: Secondary | ICD-10-CM

## 2018-04-07 DIAGNOSIS — IMO0002 Reserved for concepts with insufficient information to code with codable children: Secondary | ICD-10-CM | POA: Insufficient documentation

## 2018-04-07 DIAGNOSIS — I255 Ischemic cardiomyopathy: Secondary | ICD-10-CM | POA: Insufficient documentation

## 2018-04-07 DIAGNOSIS — I509 Heart failure, unspecified: Secondary | ICD-10-CM | POA: Insufficient documentation

## 2018-04-07 DIAGNOSIS — E1142 Type 2 diabetes mellitus with diabetic polyneuropathy: Secondary | ICD-10-CM | POA: Insufficient documentation

## 2018-04-07 DIAGNOSIS — E1165 Type 2 diabetes mellitus with hyperglycemia: Secondary | ICD-10-CM | POA: Insufficient documentation

## 2018-04-07 MED ORDER — NONFORMULARY OR COMPOUNDED ITEM: 5 refills | Status: DC

## 2018-04-07 NOTE — Telephone Encounter (Signed)
Hand delivered PT rx to [facility]. Faxed orders for neuropathy pain cream to [facility].

## 2018-04-07 NOTE — Progress Notes (Signed)
Subjective:  Patient ID: Evan [name], male    DOB: [birth year],  MRN: [id number]  Chief Complaint  Patient presents with  . Plantar Fasciitis    Follow up left heel   "Its as bad as it was 5 weeks ago, he's give me injections the last 2 visits. I think I am having nerve pain, worse at night"    85 y.o. male presents with the above complaint. History as above.  Review of Systems: Negative except as noted in the HPI. Denies N/V/F/Ch.  Past Medical History:  Diagnosis Date  . AC joint dislocation   . Allergic rhinitis   . Arthritis   . Atrial tachycardia ([place])   . BPH (benign prostatic hyperplasia)   . CAD   . CHF (congestive heart failure) ([place])   . DM    type 2  . GERD (gastroesophageal reflux disease)   . H/O: GI bleed   . HYPERCHOLESTEROLEMIA   . HYPERTENSION   . Hypothyroidism   . Iron deficiency anemia   . NEPHROLITHIASIS, HX OF   . Paroxysmal atrial fibrillation (HCC)   . Rib fracture   . TRANSIENT ISCHEMIC ATTACKS, HX OF   . 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 Vertigo     Current Outpatient Medications:  .  acetaminophen (TYLENOL) 325 MG tablet, Take 650 mg by mouth every 6 (six) hours as needed for mild pain., Disp: , Rfl:  .  amiodarone (PACERONE) 200 MG tablet, TAKE 1/2 TABLET BY MOUTH EVERY DAY, Disp: 45 tablet, Rfl: 3 .  atorvastatin (LIPITOR) 10 MG tablet, Take 1 tablet (10 mg total) by mouth daily., Disp: 30 tablet, Rfl: 0 .  carvedilol (COREG) 12.5 MG tablet, Take 1 tablet (12.5 mg total) by mouth 2 (two) times daily with a meal., Disp: 60 tablet, Rfl: 0 .  clopidogrel (PLAVIX) 75 MG tablet, TAKE 1 TABLET BY MOUTH EVERY DAY WITH BREAKFAST, Disp: 90 tablet, Rfl: 2 .  Cobalamine Combinations (VITAMIN B12-FOLIC ACID PO), Take 1 tablet by mouth every Monday, Wednesday, and Friday., Disp: , Rfl:  .  divalproex (DEPAKOTE ER) 250 MG 24 hr tablet, Take 1 tablet (250 mg total) by mouth at bedtime., Disp: 30 tablet, Rfl: 0 .  ENTRESTO  24-26 MG, TAKE 1 TABLET BY MOUTH 2 TIMES DAILY, Disp: 60 tablet, Rfl: 9 .  ferrous sulfate 324 (65 Fe) MG TBEC, Take 1 tablet by mouth 2 (two) times daily., Disp: , Rfl:  .  finasteride (PROSCAR) 5 MG tablet, Take 1 tablet (5 mg total) by mouth daily., Disp: 30 tablet, Rfl: 0 .  folic acid (FOLVITE) 025 MCG tablet, Take 400 mcg by mouth 3 (three) times a week. M, Wed,Fri, Disp: , Rfl:  .  isosorbide mononitrate (IMDUR) 30 MG 24 hr tablet, TAKE 1 TABLET BY MOUTH EVERY DAY, Disp: 90 tablet, Rfl: 3 .  levothyroxine (SYNTHROID, LEVOTHROID) 112 MCG tablet, Take 1 tablet (112 mcg total) by mouth daily before breakfast., Disp: 30 tablet, Rfl: 0 .  Loperamide HCl (IMODIUM PO), Take by mouth as directed., Disp: , Rfl:  .  meclizine (ANTIVERT) 25 MG tablet, Take 25 mg by mouth 3 (three) times daily as needed., Disp: , Rfl: 2 .  MELATONIN PO, Take 10 mg by mouth at bedtime as needed., Disp: , Rfl:  .  nitroGLYCERIN (NITROSTAT) 0.4 MG SL tablet, PLACE 1 TABLET (0.4 MG TOTAL) UNDER THE TONGUE EVERY 5 (FIVE) MINUTES AS NEEDED FOR CHEST PAIN., Disp:  25 tablet, Rfl: 0 .  saccharomyces boulardii (FLORASTOR) 250 MG capsule, Take 1 capsule (250 mg total) by mouth 2 (two) times daily., Disp: 60 capsule, Rfl: 0 .  sitaGLIPtin (JANUVIA) 25 MG tablet, Take 1 tablet (25 mg total) by mouth daily. Take one tablet by mouth once daily to control blood sugar, Disp: 30 tablet, Rfl: 0 .  torsemide (DEMADEX) 20 MG tablet, Take 2 tablets (40 mg total) by mouth daily., Disp: 180 tablet, Rfl: 1  Social History   Tobacco Use  Smoking Status Former Smoker  . Last attempt to quit: 01/06/1956  . Years since quitting: 62.2  Smokeless Tobacco Never Used    Allergies  Allergen Reactions  . Clonidine   . Clonidine Derivatives     unknown reaction   . Codeine     unknown reaction   . Meperidine   . Meperidine Hcl     REACTION: Hypotension  . Metformin   . Motrin [Ibuprofen]     unknown reaction   . Other     Celery -  unknown reaction  . Penicillins     unknown reaction   . Demerol Other (See Comments)    hypotension  . Heparin Other (See Comments)    hypotension  . Metformin And Related Diarrhea   Objective:   Vitals:   04/07/18 1020  Temp: (!) 97.2 F (36.2 C)   There is no height or weight on file to calculate BMI. Constitutional Well developed. Well nourished.  Vascular Dorsalis pedis pulses faintly palpable bilaterally. Posterior tibial pulses faintly palpable bilaterally. Capillary refill normal to all digits.  No cyanosis or clubbing noted. Pedal hair growth normal.  Neurologic Normal speech. Oriented to person, place, and time. Epicritic sensation to light touch grossly present bilaterally.  Dermatologic Nail site healing well without issue. No open wounds. No skin lesions.  Orthopedic: Normal joint ROM without pain or crepitus bilaterally. No visible deformities. Tender to palpation at the calcaneal tuber left. No pain with calcaneal squeeze left. Ankle ROM diminished range with pain left. Pain palpation about the posterior Achilles tendon Silfverskiold Test: positive left.   Radiographs: None  Assessment:   1. Plantar fasciitis, left   2. Neuropathy    Plan:  Patient was evaluated and treated and all questions answered.  Plantar Fasciitis, left, Achilles Tendonitis -  Injetction as below - Refer to PT  Procedure: Injection Tendon/Ligament Consent: Verbal consent obtained. Location: Left plantar fascia at the glabrous junction; medial approach. Skin Prep: Alcohol. Injectate: 1 cc 0.5% marcaine plain, 1 cc dexamethasone phosphate, 0.5 cc kenalog 10. Disposition: Patient tolerated procedure well. Injection site dressed with a band-aid.  Neuropathy -Rx compound pain cream -Refer to PT for neurogenix  Return in about 3 weeks (around 04/28/2018) for Plantar fasciitis.

## 2018-04-07 NOTE — Telephone Encounter (Signed)
-----   Message from Evelina Bucy, DPM sent at 04/07/2018 10:37 AM EDT ----- Can we fill out a referral to [facility] for PT for plantar fasciitis, possible neurogenix, and also a rx for compound nerve pain cream

## 2018-04-08 DIAGNOSIS — E1142 Type 2 diabetes mellitus with diabetic polyneuropathy: Secondary | ICD-10-CM | POA: Diagnosis not present

## 2018-04-09 ENCOUNTER — Other Ambulatory Visit: Payer: Self-pay | Admitting: Cardiovascular Disease

## 2018-04-09 DIAGNOSIS — I5043 Acute on chronic combined systolic (congestive) and diastolic (congestive) heart failure: Secondary | ICD-10-CM

## 2018-04-09 DIAGNOSIS — I25810 Atherosclerosis of coronary artery bypass graft(s) without angina pectoris: Secondary | ICD-10-CM

## 2018-04-14 ENCOUNTER — Other Ambulatory Visit: Payer: Self-pay

## 2018-04-14 ENCOUNTER — Other Ambulatory Visit: Payer: Self-pay | Admitting: Pharmacy Technician

## 2018-04-14 ENCOUNTER — Other Ambulatory Visit: Payer: Self-pay | Admitting: Pharmacist

## 2018-04-14 NOTE — Patient Outreach (Signed)
Evan [name] [facility]) Care Management  04/14/2018  Evan [name] 03/04/1923 [id number]   Successful outreach regarding social work referral for C-SNP patient. Per Health Risk Assessment-having trouble obtaining food. BSW explained reason for call.  Patient reports that he is living in a retirement community and is being provided three meals per day, therefore, he is not in need of food resources.  Patient reports that all needs are being met at this time.  BSW closing case.    Ronn Melena, BSW Social Worker [phone]

## 2018-04-14 NOTE — Patient Outreach (Signed)
  [place] [facility]) Care Management Chronic Special Needs Program    04/14/2018  Name: Evan [name], DOB: February 10, 1923  MRN: [id number]   Mr. Evan [name] is enrolled in a chronic special needs plan for Heart Failure.  RNCM called to follow up and review COVID-19 precautions. No answer. HIPPA compliant message left.   Plan: continue to outreach.  Thea Silversmith, RN, MSN, [place] [phone]

## 2018-04-14 NOTE — Patient Outreach (Signed)
[place] [facility]) Care Management  04/14/2018  Evan [name] [birth year] [id number]                          Medication Assistance Referral  Referral From: [place]  Medication/Company: [name] / [employer] Patient application portion:  Education officer, museum portion: Faxed  to [name]  Medication/Company: Celesta Gentile / [employer] Patient application portion:  Mailed Provider application portion:  Mailed to Dr. Shelia Media     Follow up:  Will follow up with patient in 7-14 business days to confirm application(s) have been received.  Josephene Marrone P. Santiago Graf, [place] Management [phone]

## 2018-04-14 NOTE — Patient Outreach (Addendum)
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   Outreach:  Successful telephone call with patient.  HIPAA identifiers verified.   Subjective:  Patient reports using pill packs as an adherence strategy. ([place])  Does the patient ever forget to take medication?  no Does the patient have problems obtaining medications due to transportation?   no Does the patient have problems obtaining medications due to cost?  no  Does the patient feel that medications prescribed are effective?  yes Does the patient ever experience any side effects to the medications prescribed?  no  Does the patient measure his/her own blood glucose at home?  Yes Does the patient measure his/her own blood pressure at home? No   Objective: Lab Results  Component Value Date   CREATININE 1.48 (H) 03/16/2017   CREATININE 1.41 (H) 01/13/2017   CREATININE 1.55 (H) 01/12/2017    Lab Results  Component Value Date   HGBA1C 6.9 (H) 01/11/2017    Lipid Panel  No results found for: CHOL, TRIG, HDL, CHOLHDL, VLDL, LDLCALC, LDLDIRECT  BP Readings from Last 3 Encounters:  03/03/18 (!) 180/69  11/25/17 (!) 138/56  06/03/17 (!) 142/62    Allergies  Allergen Reactions  . Clonidine   . Clonidine Derivatives     unknown reaction   . Codeine     unknown reaction   . Meperidine   . Meperidine Hcl     REACTION: Hypotension  . Metformin   . Motrin [Ibuprofen]      unknown reaction   . Other     Celery - unknown reaction  . Penicillins     unknown reaction   . Demerol Other (See Comments)    hypotension  . Heparin Other (See Comments)    hypotension  . Metformin And Related Diarrhea    Medications Reviewed Today    Reviewed by [name], [facility] (Pharmacist) on 04/14/18 at 1349  Med List Status: <None>  Medication Order Taking? Sig Documenting Provider Last Dose Status Informant  acetaminophen (TYLENOL) 325 MG tablet [id number] Yes Take 650 mg by mouth every 6 (six) hours as needed for mild pain. [provider] Taking Active Multiple Informants  amiodarone (PACERONE) 200 MG tablet [id number] Yes TAKE 1/2 TABLET BY MOUTH EVERY DAY [name], MD Taking Active   atorvastatin (LIPITOR) 10 MG tablet [id number] Yes Take 1 tablet (10 mg total) by mouth daily. Medina-Vargas, Monina C, NP Taking Active Multiple Informants  carvedilol (COREG) 12.5 MG tablet [id number] Yes Take 1 tablet (12.5 mg total) by mouth 2 (two) times daily with a meal. Medina-Vargas, Monina C, NP Taking Active Multiple Informants  clopidogrel (PLAVIX) 75 MG tablet [id number] Yes TAKE 1 TABLET BY MOUTH EVERY DAY WITH BREAKFAST [name], MD Taking Active Multiple Informants  Cobalamine Combinations (VITAMIN B12-FOLIC ACID PO) [id number] Yes Take 1 tablet by mouth every Monday, Wednesday, and Friday. [provider] Taking Active Multiple Informants  divalproex (DEPAKOTE ER) 250 MG 24 hr tablet [id number] Yes Take 1 tablet (250 mg total)  by mouth at bedtime. Medina-Vargas, Senaida Lange, NP Taking Active Multiple Informants  ENTRESTO 24-26 [place] [id number] Yes TAKE 1 TABLET BY MOUTH 2 TIMES DAILY [name], MD Taking Active   ferrous sulfate 324 (65 Fe) MG TBEC [id number] Yes Take 1 tablet by mouth 2 (two) times daily. [provider] Taking Active Multiple Informants  finasteride (PROSCAR) 5 MG tablet [id number] Yes Take 1 tablet (5 mg total) by mouth daily.  Medina-Vargas, Monina C, NP Taking Active Multiple Informants  folic acid (FOLVITE) 258 MCG tablet [id number] Yes Take 400 mcg by mouth 3 (three) times a week. M, Wed,Fri [provider] Taking Active Multiple Informants  gabapentin (NEURONTIN) 100 MG capsule [id number] Yes Take 100 mg by mouth 2 (two) times daily. Holland Commons, FNP Taking Active   isosorbide mononitrate (IMDUR) 30 MG 24 hr tablet [id number] Yes TAKE 1 TABLET BY MOUTH EVERY DAY [name], MD Taking Active   levothyroxine (SYNTHROID, LEVOTHROID) 112 MCG tablet [id number] Yes Take 1 tablet (112 mcg total) by mouth daily before breakfast. Medina-Vargas, Monina C, NP Taking Active Multiple Informants  Loperamide HCl (IMODIUM PO) [id number] Yes Take by mouth as directed. [provider] Taking Active         Discontinued 04/14/18 1347 (Completed Course)   MELATONIN PO [id number] Yes Take 10 mg by mouth at bedtime as needed. [provider] Taking Active Multiple Informants  nitroGLYCERIN (NITROSTAT) 0.4 MG SL tablet [id number] Yes PLACE 1 TABLET (0.4 MG TOTAL) UNDER THE TONGUE EVERY 5 (FIVE) MINUTES AS NEEDED FOR CHEST PAIN. Medina-Vargas, Senaida Lange, NP Taking Active Multiple Informants       Patient not taking:       Discontinued 04/14/18 1349 (Completed Course)   saccharomyces boulardii (FLORASTOR) 250 MG capsule [id number] Yes Take 1 capsule (250 mg total) by mouth 2 (two) times daily. Medina-Vargas, Monina C, NP Taking Active Multiple Informants  sitaGLIPtin (JANUVIA) 25 MG tablet [id number] Yes Take 1 tablet (25 mg total) by mouth daily. Take one tablet by mouth once daily to control blood sugar Medina-Vargas, Monina C, NP Taking Active Multiple Informants  torsemide (DEMADEX) 20 MG tablet [id number] Yes Take 2 tablets (40 mg total) by mouth daily. Isaiah Serge, NP Taking Active   Med List Note Nicki Reaper, Elmer Picker 07/30/16 8921): [facility] [phone]           Assessment:  Drugs sorted by system:  Neurologic/Psychologic: Divalproex, Gabapentin,  Cardiovascular: Amiodarone, Atorvastatin, Carvedilol, Clopidogrel, Entresto, Isosorbide MN, Nitroglycerin, Torsemide  Gastrointestinal: Loperamide, Florastor  Endocrine: Levothyroxine, Januvia  Pain: Acetaminophen  Genitourinary: Finasteride  Vitamins/Minerals/Supplements: Ferrous Sulfate, Folic Acid, Melatonin, Cobalamine Combination,   Medication Review Findings:  . HgA1c-6.9% 01/2017 . TSH-1.61 01/2017 . Scr- 1.48   Medication Assistance Findings:  Medication assistance needs identified.   Extra Help:  May be eligible for (Unsure of total income) Extra Help Low Income Subsidy eligible based on reported income and assets  Patient Assistance Programs: [name] made by [employer] o Income requirement met: Yes o Out-of-pocket prescription expenditure met:   Not Applicable - Patient has met application requirements to apply for this patient assistance program.    Januvia made by [employer] o Income requirement met: Yes o Out-of-pocket prescription expenditure met:   Not Applicable - Patient has met application requirements to apply for this patient assistance program.     Additional medication assistance options reviewed with patient as warranted:  [place] called the [employer] and held on  for >30 minutes. Requested a call back.  [place] back. Unfortunately, the heart failure fund is closed. I requested an email to let me know when the fund has reopened.  Called patient's daughter, [name], to explain the necessary paperwork.  Plan: . I will route patient assistance letter to [place] technician who will coordinate patient assistance program application process for medications listed above.  [facility] pharmacy technician will assist with obtaining all required documents from both patient and provider(s) and submit application(s) once  completed.  . Will route note to PCP.   Marland Kitchen Await call back from [place], PharmD, [place] Clinical Pharmacist [phone]

## 2018-04-15 ENCOUNTER — Telehealth: Payer: Self-pay

## 2018-04-15 NOTE — Telephone Encounter (Signed)
We received the provider part of a [employer] Pt Asst application from [employer], CPhT with [facility] asking [place] to complete,  have Dr Johnsie Cancel sign and to fax it back to [place] at [facility] @ [phone]. Application has been placed in Dr Mariana Arn mail box with above instructions attached.

## 2018-04-18 ENCOUNTER — Other Ambulatory Visit: Payer: Self-pay

## 2018-04-18 NOTE — Patient Outreach (Signed)
  [place] [facility]) Care Management Chronic Special Needs Program    04/18/2018  Name: Evan [name], DOB: [birth year]  MRN: [id number]   Mr. Ara Hollar is enrolled in a chronic special needs plan for Diabetes.  RNCM returned call to client. No answer. HIPPA compliant message left.  Plan: RNCM will outreach at next scheduled appointment if no return call.  Thea Silversmith, RN, MSN, [place] [phone]

## 2018-04-18 NOTE — Patient Outreach (Signed)
  [place] [facility]) Care Management Chronic Special Needs Program    04/18/2018  Name: Evan [name], DOB: [birth year]  MRN: [id number]   Mr. Evan [name] is enrolled in a chronic special needs plan. RNCM called to follow up and review COVID-19 precautions. No answer. HIPPA compliant message left. 2nd attempt.  Care coordination with pharmacy-RNCM spoke with pharmacist Denyse Amass, who reports that client manages his own medications and she is working with client regarding medication assistance.   Plan: await return call and follow up at next scheduled outreach.   Thea Silversmith, RN, MSN, [name] [phone]  .

## 2018-04-22 ENCOUNTER — Other Ambulatory Visit: Payer: Self-pay | Admitting: Pharmacist

## 2018-04-22 NOTE — Patient Outreach (Signed)
[facility]) Care Management  04/22/2018  Evan [name] 1923/11/25 [id number]   Patient called with questions about completing his patient assistance forms. HIPAA identifiers were obtained. Patient asked that I call his daughter via conference call.   Called patient's daughter, Evan [name] and explained what financial information is needed to complete the forms.  Evan [name] said she would mail the financial information to [employer].  Plan: Route note to [facility] for follow up .   [name], PharmD, [place] Clinical Pharmacist [phone]

## 2018-04-26 ENCOUNTER — Telehealth: Payer: Self-pay | Admitting: Pharmacist

## 2018-04-26 NOTE — Patient Outreach (Signed)
[place] [facility]) Care Management  04/26/2018  AMARII BORDAS Jun 25, 1923 [id number]   Called patient to be sure he mailed the application back to me as requested. HIPAA identifiers were obtained. Patient confirmed he sent the application back in the envelope we sent to him yesterday 04/25/2018.  Plan: Susy Frizzle will follow up with appropriate paperwork completion.  [name], PharmD, [place] Clinical Pharmacist [phone]

## 2018-04-28 ENCOUNTER — Ambulatory Visit: Payer: HMO | Admitting: Podiatry

## 2018-05-02 ENCOUNTER — Ambulatory Visit: Payer: PPO

## 2018-05-04 ENCOUNTER — Other Ambulatory Visit: Payer: Self-pay | Admitting: Pharmacy Technician

## 2018-05-04 ENCOUNTER — Ambulatory Visit: Payer: PPO | Admitting: Pharmacist

## 2018-05-04 NOTE — Patient Outreach (Signed)
[name]  05/04/2018  Evan [name] 03/03/1923 [id number]   Successful outgoing call placed to patient in regards to [employer] application for [employer] and Scientist, clinical (histocompatibility and immunogenetics) for [employer].  Spoke to patient, HIPAA identifiers verified. Patient informed he mailed back the signed applications a couple of days ago. Informed patient that I had yet to receive them but would be on the look out for them. Informed that sometimes our mail is a little slower in getting to [place] as it has to be sorted at the hospital and then distributed out. Patient informed he did not place it in the mail himself but he gave it to a reliable source to mail as his retirement home was on lock down and the residents were unable to leave their rooms. Patient informed he would be glad to sign another form if I do not receive them in the next few days.   Will followup with patient in 5-7 business days if application has not been received.  Tracyann Duffell P. Pranit Owensby, [place] Management [phone]

## 2018-05-04 NOTE — Patient Outreach (Signed)
[facility] [facility]) Care Management  05/04/2018  Evan [name] May 12, 1923 [id number]  Incoming call received from [place] at Dr Pennie Banter office.  Gay Filler informed that they have not received the application that was mailed on 4/10.  Informed Gay Filler I would remail the  application when I am in the office tomorrow. Gay Filler was agreeable.  Will re-mail provider portion to Dr. Shelia Media and will followup with Dr Pennie Banter office to inquire if it was received.  Evan [name] P. Evan [name], [place] Management [phone]

## 2018-05-04 NOTE — Patient Outreach (Signed)
[facility]) Care Management  05/04/2018  Evan [name] 1923-11-20 [id number]  Care coordination call placed to Dr Pennie Banter office in regards to patient's [employer] application for [employer].  Spoke to [place] who took a message to pass along to Dr Pennie Banter clinical staff. Informed Kieth Brightly I was inquiring about a Scientist, clinical (histocompatibility and immunogenetics) that was mailed to the provide ron 04/15/2018 for Januvia.  Will followup with provider's office in 5-7 business days if call is not returned.  [name] P. Rianna Lukes, [place] Management [phone]

## 2018-05-05 ENCOUNTER — Other Ambulatory Visit: Payer: Self-pay

## 2018-05-05 ENCOUNTER — Ambulatory Visit: Payer: HMO | Admitting: Podiatry

## 2018-05-05 ENCOUNTER — Encounter: Payer: Self-pay | Admitting: Podiatry

## 2018-05-05 DIAGNOSIS — M722 Plantar fascial fibromatosis: Secondary | ICD-10-CM

## 2018-05-05 NOTE — Progress Notes (Signed)
Subjective:  Patient ID: Evan [name], male    DOB: 11-23-1923,  MRN: [id number]  Chief Complaint  Patient presents with  . Plantar Fasciitis    L heel; "tender to touch today due to putting shoes on (haven't worn shoes in 2 wks); pain usually subsides after walking a while; Pain meds primary gave is helping with neuropathy and heel pain"    85 y.o. male presents with the above complaint. States the last injection lasted more than the first.   Review of Systems: Negative except as noted in the HPI. Denies N/V/F/Ch.  Past Medical History:  Diagnosis Date  . AC joint dislocation   . Allergic rhinitis   . Arthritis   . Atrial tachycardia ([place])   . BPH (benign prostatic hyperplasia)   . CAD   . CHF (congestive heart failure) ([place])   . DM    type 2  . GERD (gastroesophageal reflux disease)   . H/O: GI bleed   . HYPERCHOLESTEROLEMIA   . HYPERTENSION   . Hypothyroidism   . Iron deficiency anemia   . NEPHROLITHIASIS, HX OF   . Paroxysmal atrial fibrillation ([facility])   . Rib fracture   . TRANSIENT ISCHEMIC ATTACKS, HX OF   . 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 Vertigo     Current Outpatient Medications:  .  acetaminophen (TYLENOL) 325 MG tablet, Take 650 mg by mouth every 6 (six) hours as needed for mild pain., Disp: , Rfl:  .  amiodarone (PACERONE) 200 MG tablet, TAKE 1/2 TABLET BY MOUTH EVERY DAY, Disp: 45 tablet, Rfl: 3 .  atorvastatin (LIPITOR) 10 MG tablet, Take 1 tablet (10 mg total) by mouth daily., Disp: 30 tablet, Rfl: 0 .  carvedilol (COREG) 12.5 MG tablet, Take 1 tablet (12.5 mg total) by mouth 2 (two) times daily with a meal., Disp: 60 tablet, Rfl: 0 .  clopidogrel (PLAVIX) 75 MG tablet, TAKE 1 TABLET BY MOUTH EVERY DAY WITH BREAKFAST, Disp: 90 tablet, Rfl: 2 .  Cobalamine Combinations (VITAMIN B12-FOLIC ACID PO), Take 1 tablet by mouth every Monday, Wednesday, and Friday., Disp: , Rfl:  .  divalproex (DEPAKOTE ER) 250 MG 24 hr tablet,  Take 1 tablet (250 mg total) by mouth at bedtime., Disp: 30 tablet, Rfl: 0 .  ENTRESTO 24-26 MG, TAKE 1 TABLET BY MOUTH 2 TIMES DAILY, Disp: 60 tablet, Rfl: 9 .  ferrous sulfate 324 (65 Fe) MG TBEC, Take 1 tablet by mouth 2 (two) times daily., Disp: , Rfl:  .  finasteride (PROSCAR) 5 MG tablet, Take 1 tablet (5 mg total) by mouth daily., Disp: 30 tablet, Rfl: 0 .  folic acid (FOLVITE) 735 MCG tablet, Take 400 mcg by mouth 3 (three) times a week. M, Wed,Fri, Disp: , Rfl:  .  gabapentin (NEURONTIN) 100 MG capsule, Take 100 mg by mouth 2 (two) times daily., Disp: , Rfl:  .  isosorbide mononitrate (IMDUR) 30 MG 24 hr tablet, TAKE 1 TABLET BY MOUTH EVERY DAY, Disp: 90 tablet, Rfl: 1 .  levothyroxine (SYNTHROID, LEVOTHROID) 112 MCG tablet, Take 1 tablet (112 mcg total) by mouth daily before breakfast., Disp: 30 tablet, Rfl: 0 .  Loperamide HCl (IMODIUM PO), Take by mouth as directed., Disp: , Rfl:  .  MELATONIN PO, Take 10 mg by mouth at bedtime as needed., Disp: , Rfl:  .  nitroGLYCERIN (NITROSTAT) 0.4 MG SL tablet, PLACE 1 TABLET (0.4 MG TOTAL) UNDER THE TONGUE EVERY 5 (FIVE)  MINUTES AS NEEDED FOR CHEST PAIN., Disp: 25 tablet, Rfl: 0 .  saccharomyces boulardii (FLORASTOR) 250 MG capsule, Take 1 capsule (250 mg total) by mouth 2 (two) times daily., Disp: 60 capsule, Rfl: 0 .  sitaGLIPtin (JANUVIA) 25 MG tablet, Take 1 tablet (25 mg total) by mouth daily. Take one tablet by mouth once daily to control blood sugar, Disp: 30 tablet, Rfl: 0 .  torsemide (DEMADEX) 20 MG tablet, Take 2 tablets (40 mg total) by mouth daily., Disp: 180 tablet, Rfl: 1  Social History   Tobacco Use  Smoking Status Former Smoker  . Last attempt to quit: 01/06/1956  . Years since quitting: 62.3  Smokeless Tobacco Never Used    Allergies  Allergen Reactions  . Clonidine   . Clonidine Derivatives     unknown reaction   . Codeine     unknown reaction   . Meperidine   . Meperidine Hcl     REACTION: Hypotension  .  Metformin   . Motrin [Ibuprofen]     unknown reaction   . Other     Celery - unknown reaction  . Penicillins     unknown reaction   . Demerol Other (See Comments)    hypotension  . Heparin Other (See Comments)    hypotension  . Metformin And Related Diarrhea   Objective:  There were no vitals filed for this visit. There is no height or weight on file to calculate BMI. Constitutional Well developed. Well nourished.  Vascular Dorsalis pedis pulses palpable bilaterally. Posterior tibial pulses palpable bilaterally. Capillary refill normal to all digits.  No cyanosis or clubbing noted. Pedal hair growth normal.  Neurologic Normal speech. Oriented to person, place, and time. Epicritic sensation to light touch grossly present bilaterally.  Dermatologic Nails well groomed and normal in appearance. No open wounds. No skin lesions.  Orthopedic: POP R medial calc tuber   Radiographs: none Assessment:   1. Plantar fasciitis, left    Plan:  Patient was evaluated and treated and all questions answered.  Plantar fasciitis -Repeat injection as below. Injection #3. -Educated on continued icing.  Procedure: Injection Tendon/Ligament Consent: Verbal consent obtained. Location: Left plantar fascia at the glabrous junction; medial approach. Skin Prep: Alcohol. Injectate: 1 cc 0.5% marcaine plain, 1 cc dexamethasone phosphate, 0.5 cc kenalog 10. Disposition: Patient tolerated procedure well. Injection site dressed with a band-aid.   Return in about 6 weeks (around 06/16/2018) for Plantar fasciitis, Left.

## 2018-05-10 ENCOUNTER — Ambulatory Visit: Payer: Self-pay | Admitting: Pharmacist

## 2018-05-13 ENCOUNTER — Other Ambulatory Visit: Payer: Self-pay | Admitting: Pharmacy Technician

## 2018-05-13 NOTE — Patient Outreach (Signed)
[facility] [facility]) Care Management  05/13/2018  Evan [name] May 12, 1923 [id number]   Unsuccessful outgoing call placed to patient in regards to [employer] patient assistance for [employer] and [employer] for [employer].  Unfortunately patient did not answer the phone, HIPAA compliant voicemail left.  Was calling patient to inquire about his applications and if he mailed them back. Received the [employer] application but not the Microsoft.  Will followup with patient in 3-5 business days if call is not returned.  [name] P. Daniel Johndrow, [place] Management [phone]

## 2018-05-16 ENCOUNTER — Other Ambulatory Visit: Payer: Self-pay | Admitting: Pharmacy Technician

## 2018-05-16 NOTE — Patient Outreach (Signed)
[facility]) Care Management  05/16/2018  Evan [name] August 28, 1923 [id number]  Care coordination call placed to Dr Pennie Banter office in regards to patient's [employer] application for [employer].  Spoke to [place] and informed her that I was following up on the application that was re mailed to the provider on 05/05/2018 to inquire if they had received it and to check on the status of it. Kieth Brightly informed she would send the message back to the nurse's station.  Will followup in 3-5 business days if call is not returned.  Belanna Manring P. Elohim Brune, [place] Management [phone]

## 2018-05-17 ENCOUNTER — Other Ambulatory Visit: Payer: Self-pay | Admitting: Pharmacy Technician

## 2018-05-17 DIAGNOSIS — H353222 Exudative age-related macular degeneration, left eye, with inactive choroidal neovascularization: Secondary | ICD-10-CM | POA: Diagnosis not present

## 2018-05-17 DIAGNOSIS — H353132 Nonexudative age-related macular degeneration, bilateral, intermediate dry stage: Secondary | ICD-10-CM | POA: Diagnosis not present

## 2018-05-17 DIAGNOSIS — H4720 Unspecified optic atrophy: Secondary | ICD-10-CM | POA: Diagnosis not present

## 2018-05-17 NOTE — Patient Outreach (Signed)
[name]) Care Management  05/17/2018  Evan [name] 01-26-1923 [id number]    Successful outgoing call placed to patient in regards to [employer] application for Entresto.  Spoke to patient, HIPAA identifiers verified.  Informed patient that I received his [employer]ck application along with a copy of his insurance card but was missing the [employer] application. Patient informed he thought he put it all in the same envelope. He requested that I send him another Novartis application because at this point he informed he would not know where it would be.  Prepared 2nd Novartis application to be mailed to patient.   Will followup with patient in 5-7 business days to inquire if he received the application.  Jvon Meroney P. Rashard Ryle, [place] Management [phone]

## 2018-05-19 ENCOUNTER — Other Ambulatory Visit: Payer: Self-pay | Admitting: Pharmacy Technician

## 2018-05-19 NOTE — Patient Outreach (Signed)
[facility]) Care Management  05/19/2018  Evan [name] 1923/07/05 [id number]    Received all necessary paperwork, documents and signatures back from both patient and provider for [employer] patient assistance for Januvia.  Submitted completed application via mail to [employer].  Will followup with [employer] in 10-14 business days to inquire on status of application.  Evan [name], [place] Management [phone]

## 2018-05-25 ENCOUNTER — Other Ambulatory Visit: Payer: Self-pay | Admitting: Pharmacy Technician

## 2018-05-25 NOTE — Patient Outreach (Signed)
[place] [facility]) Care Management  05/25/2018  SHONN FARRUGGIA 1923/07/29 [id number]    ADDENDUM  Incoming call received from patient in regards to [employer] application for [employer].  Spoke to patient, HIPAA identifiers verified.  Patient informed that he did receive the Novartis application and he placed it in the mail yesterday.  Will followup with patient in 10-14 business days if application not received.  Vernell Townley P. Javi Bollman, [place] Management [phone]

## 2018-05-25 NOTE — Patient Outreach (Signed)
[facility]) Care Management  05/25/2018  SAATHVIK EVERY 02/15/1923 [id number]    Unsuccessful outreach call placed to patient in regards to [employer] application for [employer].  Unfortuantely patient did not answer the phone, HIPAA compliant voicemail left.  Was calling patient to inquire if he received the 2nd mailing of the Novartis application in the mail.  Will followup with 2nd outreach call in 3-5 business days if call is not returned.  Hanny Elsberry P. Nicholette Dolson, [place] Management [phone]

## 2018-05-28 ENCOUNTER — Other Ambulatory Visit: Payer: Self-pay | Admitting: Cardiovascular Disease

## 2018-05-28 DIAGNOSIS — I5043 Acute on chronic combined systolic (congestive) and diastolic (congestive) heart failure: Secondary | ICD-10-CM

## 2018-06-01 ENCOUNTER — Telehealth: Payer: Self-pay

## 2018-06-01 NOTE — Telephone Encounter (Signed)
Left message for patient to call back. 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 Will need to get consent for virtual or screen for in office visit.

## 2018-06-02 DIAGNOSIS — M25572 Pain in left ankle and joints of left foot: Secondary | ICD-10-CM | POA: Diagnosis not present

## 2018-06-02 DIAGNOSIS — M62572 Muscle wasting and atrophy, not elsewhere classified, left ankle and foot: Secondary | ICD-10-CM | POA: Diagnosis not present

## 2018-06-02 DIAGNOSIS — M25672 Stiffness of left ankle, not elsewhere classified: Secondary | ICD-10-CM | POA: Diagnosis not present

## 2018-06-02 DIAGNOSIS — M25472 Effusion, left ankle: Secondary | ICD-10-CM | POA: Diagnosis not present

## 2018-06-02 NOTE — Telephone Encounter (Signed)
Left second message for patient to call back.

## 2018-06-02 NOTE — Telephone Encounter (Signed)
Virtual Visit Pre-Appointment Phone Call  "(Name), I am calling you today to discuss your upcoming appointment. We are currently trying to limit exposure to the virus that causes COVID-19 by seeing patients at home rather than in the office."  1. "What is the BEST phone number to call the day of the visit?" - include this in appointment notes  2. "Do you have or have access to (through a family member/friend) a smartphone with video capability that we can use for your visit?" a. If yes - list this number in appt notes as "cell" (if different from BEST phone #) and list the appointment type as a VIDEO visit in appointment notes b. If no - list the appointment type as a PHONE visit in appointment notes  3. Confirm consent - "In the setting of the current Covid19 crisis, you are scheduled for a (phone or video) visit with your provider on (date) at (time).  Just as we do with many in-office visits, in order for you to participate in this visit, we must obtain consent.  If you'd like, I can send this to your mychart (if signed up) or email for you to review.  Otherwise, I can obtain your verbal consent now.  All virtual visits are billed to your insurance company just like a normal visit would be.  By agreeing to a virtual visit, we'd like you to understand that the technology does not allow for your provider to perform an examination, and thus may limit your provider's ability to fully assess your condition. If your provider identifies any concerns that need to be evaluated in person, we will make arrangements to do so.  Finally, though the technology is pretty good, we cannot assure that it will always work on either your or our end, and in the setting of a video visit, we may have to convert it to a phone-only visit.  In either situation, we cannot ensure that we have a secure connection.  Are you willing to proceed? YES  4. Advise patient to be prepared - "Two hours prior to your appointment, go  ahead and check your blood pressure, pulse, oxygen saturation, and your weight (if you have the equipment to check those) and write them all down. When your visit starts, your provider will ask you for this information. If you have an Apple Watch or Kardia device, please plan to have heart rate information ready on the day of your appointment. Please have a pen and paper handy nearby the day of the visit as well."  5. Give patient instructions for MyChart download to smartphone OR [email or web address] as below if video visit (depending on what platform provider is using)  6. Inform patient they will receive a phone call 15 minutes prior to their appointment time (may be from unknown caller ID) so they should be prepared to answer    TELEPHONE CALL NOTE  Evan [name] has been deemed a candidate for a follow-up tele-health visit to limit community exposure during the Covid-19 pandemic. I spoke with the patient via phone to ensure availability of phone/video source, confirm preferred email & phone number, and discuss instructions and expectations.  I reminded Evan [name] to be prepared with any vital sign and/or heart rhythm information that could potentially be obtained via home monitoring, at the time of his visit. I reminded Evan [name] to expect a phone call prior to his visit.  Michaelyn Barter, RN 06/02/2018 4:37 PM  FULL LENGTH CONSENT FOR TELE-HEALTH VISIT   I hereby voluntarily request, consent and authorize [place] and its employed or contracted physicians, physician assistants, nurse practitioners or other licensed health care professionals (the Practitioner), to provide me with telemedicine health care services (the "Services") as deemed necessary by the treating Practitioner. I acknowledge and consent to receive the Services by the Practitioner via telemedicine. I understand that the telemedicine visit will involve communicating with the Practitioner  through live audiovisual communication technology and the disclosure of certain medical information by electronic transmission. I acknowledge that I have been given the opportunity to request an in-person assessment or other available alternative prior to the telemedicine visit and am voluntarily participating in the telemedicine visit.  I understand that I have the right to withhold or withdraw my consent to the use of telemedicine in the course of my care at any time, without affecting my right to future care or treatment, and that the Practitioner or I may terminate the telemedicine visit at any time. I understand that I have the right to inspect all information obtained and/or recorded in the course of the telemedicine visit and may receive copies of available information for a reasonable fee.  I understand that some of the potential risks of receiving the Services via telemedicine include:  Marland Kitchen Delay or interruption in medical evaluation due to technological equipment failure or disruption; . Information transmitted may not be sufficient (e.g. poor resolution of images) to allow for appropriate medical decision making by the Practitioner; and/or  . In rare instances, security protocols could fail, causing a breach of personal health information.  Furthermore, I acknowledge that it is my responsibility to provide information about my medical history, conditions and care that is complete and accurate to the best of my ability. I acknowledge that Practitioner's advice, recommendations, and/or decision may be based on factors not within their control, such as incomplete or inaccurate data provided by me or distortions of diagnostic images or specimens that may result from electronic transmissions. I understand that the practice of medicine is not an exact science and that Practitioner makes no warranties or guarantees regarding treatment outcomes. I acknowledge that I will receive a copy of this consent  concurrently upon execution via email to the email address I last provided but may also request a printed copy by calling the office of [place].    I understand that my insurance will be billed for this visit.   I have read or had this consent read to me. . I understand the contents of this consent, which adequately explains the benefits and risks of the Services being provided via telemedicine.  . I have been provided ample opportunity to ask questions regarding this consent and the Services and have had my questions answered to my satisfaction. . I give my informed consent for the services to be provided through the use of telemedicine in my medical care  By participating in this telemedicine visit I agree to the above.

## 2018-06-03 ENCOUNTER — Other Ambulatory Visit: Payer: Self-pay | Admitting: Pharmacy Technician

## 2018-06-03 NOTE — Patient Outreach (Signed)
[place] [facility]) Care Management  06/03/2018  Evan [name] January 05, 1924 [id number]  Care coordination call placed to [employer] patient assistance in regards to patient's application for Januvia.  Spoke to [place] who informed they had received the application and had mailed out the application on 4/47/3958. She informed it can take between 7-14 business days to arrive at patient's home.  Will followup with patient in 5-10 business days to inform and  inquire if he has received the attestion letter.  Chantavia Bazzle P. Loranda Mastel, [place] Management [phone]

## 2018-06-03 NOTE — Progress Notes (Signed)
Virtual Visit via Telephone Note   This visit type was conducted due to national recommendations for restrictions regarding the COVID-19 Pandemic (e.g. social distancing) in an effort to limit this patient's exposure and mitigate transmission in our community.  Due to his co-morbid illnesses, this patient is at least at moderate risk for complications without adequate follow up.  This format is felt to be most appropriate for this patient at this time.  The patient did not have access to video technology/had technical difficulties with video requiring transitioning to audio format only (telephone).  All issues noted in this document were discussed and addressed.  No physical exam could be performed with this format.  Please refer to the patient's chart for his  consent to telehealth for [facility].   Date:  06/08/2018   ID:  Evan [name], DOB 07-28-1923, MRN [id number]  Patient Location:  Assisted Living  Provider Location: Office  PCP:  Deland Pretty, MD  Cardiologist:   Johnsie Cancel Electrophysiologist:  None   Evaluation Performed:  Follow-Up Visit  Chief Complaint:  CAD/ Dyspnea  History of Present Illness:    85 y.o. .  history of CAD status post CABG x5 in 1996, DES to SVG to OM 1/2 in 2015, LV dysfunction ejection fraction 25-30% 07/2016, PAF and PAT on amiodarone, PAD, complete heart block status post pacemaker, diabetes mellitus type 2 and hypothyroidism. Patient has chronic weakness in the legs with atrophy and frequent falls. He is not felt to be a good candidate for anticoagulation.  He had an admission 06/2016 with hematochezia and chest pain. Hemoglobin was 8 and NSTEMItroponin peaked at 8.21. He was not started on heparin. Aspirin and Plavix were discontinued given active bleeding. Readmitted 07/2016 with CHF Echo showed worsened ejection fraction 25-30%. Norvasc was stopped and Entresto started. Saw Dr. Nishan10/16/18 and had worsening lower extremity edema. Lasix  increased to 80 mg twice daily and Zaroxolyn 5 mg added Monday Wednesday Friday. Creatinine 1.29 on 10/27/16. This has been stopped    Hospitalization 1/7 to 10/2017 with PAF no anticoagultion due to GI bleed.  Chronic systolic HF.  Demand ischemia .  pk troponin 3.05  Living at [facility]   No cardiac complaints   The patient  does not have symptoms concerning for COVID-19 infection (fever, chills, cough, or new shortness of breath).    Past Medical History:  Diagnosis Date  . AC joint dislocation   . Allergic rhinitis   . Arthritis   . Atrial tachycardia ([place])   . BPH (benign prostatic hyperplasia)   . CAD   . CHF (congestive heart failure) ([place])   . DM    type 2  . GERD (gastroesophageal reflux disease)   . H/O: GI bleed   . HYPERCHOLESTEROLEMIA   . HYPERTENSION   . Hypothyroidism   . Iron deficiency anemia   . NEPHROLITHIASIS, HX OF   . Paroxysmal atrial fibrillation (HCC)   . Rib fracture   . TRANSIENT ISCHEMIC ATTACKS, HX OF   . 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 Vertigo    Past Surgical History:  Procedure Laterality Date  . AV FISTULA REPAIR    . BACK SURGERY  2000'S  . CARDIAC CATHETERIZATION  12/11/2013   Procedure: CORONARY STENT INTERVENTION;  Surgeon: Burnell Blanks, MD;  Location: [facility] CATH LAB;  Service: Cardiovascular;;  DES 3.5 x 15 Resolute to seq SVG of OM1/OM2   . CORONARY ARTERY BYPASS GRAFT  [place]  12/11/13   SVG-OM DES  . FEMORAL ARTERY ANEURYSM REPAIR     feroral pseudo-aneurysm repair  . INSERT / REPLACE / REMOVE PACEMAKER    . LEFT HEART CATHETERIZATION WITH CORONARY/GRAFT ANGIOGRAM N/A 12/11/2013   Procedure: LEFT HEART CATHETERIZATION WITH Beatrix Fetters;  Surgeon: Burnell Blanks, MD;  Location[facility] CATH LAB;  Service: Cardiovascular;  Laterality: N/A;  . PERMANENT PACEMAKER INSERTION N/A 12/12/2013   MDT ADDRL1 pacemaker implnated by Dr Rayann Heman  . PTCA   1980's     Current Meds  Medication Sig  . acetaminophen (TYLENOL) 325 MG tablet Take 650 mg by mouth every 6 (six) hours as needed for mild pain.  Marland Kitchen amiodarone (PACERONE) 200 MG tablet TAKE 1/2 TABLET BY MOUTH EVERY DAY  . atorvastatin (LIPITOR) 10 MG tablet Take 1 tablet (10 mg total) by mouth daily.  . carvedilol (COREG) 12.5 MG tablet Take 1 tablet (12.5 mg total) by mouth 2 (two) times daily with a meal.  . clopidogrel (PLAVIX) 75 MG tablet TAKE 1 TABLET BY MOUTH EVERY DAY WITH BREAKFAST  . Cobalamine Combinations (VITAMIN B12-FOLIC ACID PO) Take 1 tablet by mouth every Monday, Wednesday, and Friday.  . divalproex (DEPAKOTE ER) 250 MG 24 hr tablet Take 1 tablet (250 mg total) by mouth at bedtime.  Marland Kitchen ENTRESTO 24-26 MG TAKE 1 TABLET BY MOUTH 2 TIMES DAILY  . ferrous sulfate 324 (65 Fe) MG TBEC Take 1 tablet by mouth 2 (two) times daily.  . finasteride (PROSCAR) 5 MG tablet Take 1 tablet (5 mg total) by mouth daily.  . folic acid (FOLVITE) 209 MCG tablet Take 400 mcg by mouth 3 (three) times a week. M, Wed,Fri  . gabapentin (NEURONTIN) 100 MG capsule Take 100 mg by mouth 2 (two) times daily.  . isosorbide mononitrate (IMDUR) 30 MG 24 hr tablet TAKE 1 TABLET BY MOUTH EVERY DAY  . levothyroxine (SYNTHROID, LEVOTHROID) 112 MCG tablet Take 1 tablet (112 mcg total) by mouth daily before breakfast.  . Loperamide HCl (IMODIUM PO) Take by mouth as directed.  Marland Kitchen MELATONIN PO Take 10 mg by mouth at bedtime as needed.  . nitroGLYCERIN (NITROSTAT) 0.4 MG SL tablet PLACE 1 TABLET (0.4 MG TOTAL) UNDER THE TONGUE EVERY 5 (FIVE) MINUTES AS NEEDED FOR CHEST PAIN.  Marland Kitchen saccharomyces boulardii (FLORASTOR) 250 MG capsule Take 1 capsule (250 mg total) by mouth 2 (two) times daily.  . sitaGLIPtin (JANUVIA) 25 MG tablet Take 1 tablet (25 mg total) by mouth daily. Take one tablet by mouth once daily to control blood sugar  . torsemide (DEMADEX) 20 MG tablet Take 2 tablets (40 mg total) by mouth daily.      Allergies:   Clonidine; Clonidine derivatives; Codeine; Meperidine; Meperidine hcl; Metformin; Motrin [ibuprofen]; Other; Penicillins; Demerol; Heparin; and Metformin and related   Social History   Tobacco Use  . Smoking status: Former Smoker    Last attempt to quit: 01/06/1956    Years since quitting: 62.4  . Smokeless tobacco: Never Used  Substance Use Topics  . Alcohol use: No    Alcohol/week: 0.0 standard drinks  . Drug use: No     Family Hx: The patient's family history includes Coronary artery disease in his father; Diabetes in his mother.  ROS:   Please see the history of present illness.     All other systems reviewed and are negative.   Prior CV studies:   The following studies were reviewed today:  PaCEART 07/12/17 Echo 07/30/16  Labs/Other Tests and Data Reviewed:    EKG:   Afib , V pacing 01/12/17   Recent Labs: No results found for requested labs within last 8760 hours.   Recent Lipid Panel No results found for: CHOL, TRIG, HDL, CHOLHDL, LDLCALC, LDLDIRECT  Wt Readings from Last 3 Encounters:  06/08/18 72.6 kg  11/25/17 74 kg  06/03/17 71.3 kg     Objective:    Vital Signs:  Ht 5\' 7"  (1.702 m)   Wt 72.6 kg   BMI 25.06 kg/m    Telephone visit no exam   ASSESSMENT & PLAN:    1. GI bleed: stable Hct 40.7 11/2017 needs to see GI regarding f/u Cdiff and chronic diarrhea Using imodium regularly   2. CAD no angina on plavix with no aspirin due to GI bleeding given age conservative Rx avoiding cath if possible  3.  Chronic systolic and diastolic HF.  Better with demedex EF 25-30% TTE 07/30/16 Tolerating entresto with stable Cr   4.  PAF unable to check rhythm on phone has been regular with V pacing no anticoagulation due to age and GI bleeding   5.  HTN Well controlled.  Continue current medications and low sodium Dash type diet.    6. PPM:  Normal function reviewed PaceArt f/u with Chanetta Marshall NP   COVID-19 Education: The signs and symptoms  of COVID-19 were discussed with the patient and how to seek care for testing (follow up with PCP or arrange E-visit).  The importance of social distancing was discussed today.  Time:   Today, I have spent 30 minutes with the patient with telehealth technology discussing the above problems.     Medication Adjustments/Labs and Tests Ordered: Current medicines are reviewed at length with the patient today.  Concerns regarding medicines are outlined above.   Tests Ordered:  None   Medication Changes:  None   Disposition:  Follow up in 6 months  Signed, Jenkins Rouge, MD  06/08/2018 2:36 PM    [facility]

## 2018-06-07 ENCOUNTER — Ambulatory Visit: Payer: Self-pay | Admitting: Pharmacist

## 2018-06-07 DIAGNOSIS — M25472 Effusion, left ankle: Secondary | ICD-10-CM | POA: Diagnosis not present

## 2018-06-07 DIAGNOSIS — M62572 Muscle wasting and atrophy, not elsewhere classified, left ankle and foot: Secondary | ICD-10-CM | POA: Diagnosis not present

## 2018-06-07 DIAGNOSIS — M25672 Stiffness of left ankle, not elsewhere classified: Secondary | ICD-10-CM | POA: Diagnosis not present

## 2018-06-07 DIAGNOSIS — M25572 Pain in left ankle and joints of left foot: Secondary | ICD-10-CM | POA: Diagnosis not present

## 2018-06-08 ENCOUNTER — Encounter: Payer: Self-pay | Admitting: Cardiovascular Disease

## 2018-06-08 ENCOUNTER — Other Ambulatory Visit: Payer: Self-pay

## 2018-06-08 ENCOUNTER — Telehealth (INDEPENDENT_AMBULATORY_CARE_PROVIDER_SITE_OTHER): Payer: HMO | Admitting: Cardiovascular Disease

## 2018-06-08 VITALS — Ht 67.0 in | Wt 160.0 lb

## 2018-06-08 DIAGNOSIS — I5022 Chronic systolic (congestive) heart failure: Secondary | ICD-10-CM

## 2018-06-08 DIAGNOSIS — Z951 Presence of aortocoronary bypass graft: Secondary | ICD-10-CM

## 2018-06-08 NOTE — Patient Instructions (Signed)

## 2018-06-10 ENCOUNTER — Other Ambulatory Visit: Payer: Self-pay | Admitting: Pharmacy Technician

## 2018-06-10 NOTE — Patient Outreach (Signed)
[facility]) Care Management  06/10/2018  IVORY BAIL 29-Dec-1923 [id number]   Unsuccessful outreach call placed to patient in regards to [employer] application for [employer] and Scientist, clinical (histocompatibility and immunogenetics) for [employer].  Unfortunately patient did not answer the phone, HIPAA compliant voicemail left.  Was calling patient to inquire if he has received the attestation letter from [employer] and if he mailed back his [employer] application.  Will followup with patient in 3-5 business days if call is not returned.  Austen Wygant P. Quanasia Defino, [place] Management [phone]

## 2018-06-14 DIAGNOSIS — M25672 Stiffness of left ankle, not elsewhere classified: Secondary | ICD-10-CM | POA: Diagnosis not present

## 2018-06-14 DIAGNOSIS — M62572 Muscle wasting and atrophy, not elsewhere classified, left ankle and foot: Secondary | ICD-10-CM | POA: Diagnosis not present

## 2018-06-14 DIAGNOSIS — M25572 Pain in left ankle and joints of left foot: Secondary | ICD-10-CM | POA: Diagnosis not present

## 2018-06-14 DIAGNOSIS — M25472 Effusion, left ankle: Secondary | ICD-10-CM | POA: Diagnosis not present

## 2018-06-16 ENCOUNTER — Other Ambulatory Visit: Payer: Self-pay | Admitting: Pharmacy Technician

## 2018-06-16 ENCOUNTER — Other Ambulatory Visit: Payer: Self-pay

## 2018-06-16 ENCOUNTER — Ambulatory Visit: Payer: HMO | Admitting: Podiatry

## 2018-06-16 ENCOUNTER — Ambulatory Visit (INDEPENDENT_AMBULATORY_CARE_PROVIDER_SITE_OTHER): Payer: HMO

## 2018-06-16 DIAGNOSIS — M722 Plantar fascial fibromatosis: Secondary | ICD-10-CM

## 2018-06-16 DIAGNOSIS — R234 Changes in skin texture: Secondary | ICD-10-CM | POA: Diagnosis not present

## 2018-06-16 NOTE — Patient Outreach (Signed)
[place] [facility]) Care Management  06/16/2018  NORBERT MALKIN 09/21/1923 [id number]    Received all necessary paperwork and signatures for [employer] patient assistance for Entresto.  Submitted completed application via fax.  Will followup with [employer] in 7-10 business days to inquire on status of application.  Brandun Pinn P. Faryn Sieg, [place] Management [phone]

## 2018-06-16 NOTE — Patient Outreach (Signed)
[place] [facility]) Care Management  06/16/2018  Evan [name] 16-Nov-1923 [id number]    Received all necessary documents and signatures for both patient and provider for [employer] patient assistance for [facility].  Submitted completed application via fax to [employer].  Will followup with [employer] in 7-10 business days to inquire on status of application.  Shakiyah Cirilo P. Hebe Merriwether, [place] Management [phone]

## 2018-06-17 ENCOUNTER — Other Ambulatory Visit: Payer: Self-pay | Admitting: Pharmacy Technician

## 2018-06-17 NOTE — Patient Outreach (Signed)
[place] [facility]) Care Management  06/17/2018  Evan [name] Sep 02, 1923 [id number]  Unsuccessful outreach call placed to patient in regards to [employer] application for [name]. Unfortunately patient did not answer the phone, HIPAA compliant voicemail left.  Was calling patient to inquire if he had received the attestation form from [employer] that was mailed to him on 05/30/2018.  Will followup with patient in 5-7 business days if call is not returned.  Faatima Tench P. Nickia Boesen, [place] Management [phone]

## 2018-06-21 DIAGNOSIS — M25672 Stiffness of left ankle, not elsewhere classified: Secondary | ICD-10-CM | POA: Diagnosis not present

## 2018-06-21 DIAGNOSIS — M25572 Pain in left ankle and joints of left foot: Secondary | ICD-10-CM | POA: Diagnosis not present

## 2018-06-21 DIAGNOSIS — M62572 Muscle wasting and atrophy, not elsewhere classified, left ankle and foot: Secondary | ICD-10-CM | POA: Diagnosis not present

## 2018-06-21 DIAGNOSIS — M25472 Effusion, left ankle: Secondary | ICD-10-CM | POA: Diagnosis not present

## 2018-06-23 ENCOUNTER — Other Ambulatory Visit: Payer: Self-pay | Admitting: Pharmacy Technician

## 2018-06-23 ENCOUNTER — Other Ambulatory Visit: Payer: Self-pay | Admitting: Cardiovascular Disease

## 2018-06-23 DIAGNOSIS — Z862 Personal history of diseases of the blood and blood-forming organs and certain disorders involving the immune mechanism: Secondary | ICD-10-CM | POA: Diagnosis not present

## 2018-06-23 DIAGNOSIS — I480 Paroxysmal atrial fibrillation: Secondary | ICD-10-CM

## 2018-06-23 DIAGNOSIS — E7800 Pure hypercholesterolemia, unspecified: Secondary | ICD-10-CM | POA: Diagnosis not present

## 2018-06-23 DIAGNOSIS — E039 Hypothyroidism, unspecified: Secondary | ICD-10-CM | POA: Diagnosis not present

## 2018-06-23 DIAGNOSIS — E1139 Type 2 diabetes mellitus with other diabetic ophthalmic complication: Secondary | ICD-10-CM | POA: Diagnosis not present

## 2018-06-23 NOTE — Patient Outreach (Signed)
[place] [facility]) Care Management  06/23/2018  Evan [name] 1923/09/30 [id number]    Successful outreach call placed to patient in regards to [employer]ck application for [place].  Spoke to patient, HIPAA identifiers verified.  Patient informed he did receive a letter from [employer]. Patient is unsure of whether he sent it back or not but he is thinking that he did not. Inquired if patient had time to discuss the letter and patient informed it would take some research for him to find the letter. He informed he would call me back.  Will followup with patient in 3-7 business days if call is not returned.  Kadience Macchi P. Avantae Bither, [place] Management [phone]

## 2018-06-24 ENCOUNTER — Other Ambulatory Visit: Payer: Self-pay | Admitting: Pharmacy Technician

## 2018-06-24 NOTE — Patient Outreach (Signed)
[place] [facility]) Care Management  06/24/2018  Evan [name] [birth year] [id number]    Care coordination call placed to [employer] in regards to patient's [facility] application.  Spoke to Evan [name] who informed patient had been APPROVED 06/20/2018-01/05/2019. Evan [name] informed their pharmacy [facility] has received the prescription request and is working on the order. She informed the pharmacy will reach out to patient to schedule a delivery. She informed they will leave patient a message if unable to contact him at the time they are outreaching him.  Will followup with patient in 10-14 business days to inquire if the medication was received.  Evan [name], [place] Management [phone]

## 2018-06-27 DIAGNOSIS — L853 Xerosis cutis: Secondary | ICD-10-CM | POA: Diagnosis not present

## 2018-06-27 DIAGNOSIS — I2510 Atherosclerotic heart disease of native coronary artery without angina pectoris: Secondary | ICD-10-CM | POA: Diagnosis not present

## 2018-06-27 DIAGNOSIS — E1139 Type 2 diabetes mellitus with other diabetic ophthalmic complication: Secondary | ICD-10-CM | POA: Diagnosis not present

## 2018-06-27 DIAGNOSIS — Z22322 Carrier or suspected carrier of Methicillin resistant Staphylococcus aureus: Secondary | ICD-10-CM | POA: Diagnosis not present

## 2018-06-27 DIAGNOSIS — L97421 Non-pressure chronic ulcer of left heel and midfoot limited to breakdown of skin: Secondary | ICD-10-CM | POA: Diagnosis not present

## 2018-06-27 DIAGNOSIS — R600 Localized edema: Secondary | ICD-10-CM | POA: Diagnosis not present

## 2018-06-27 DIAGNOSIS — M79672 Pain in left foot: Secondary | ICD-10-CM | POA: Diagnosis not present

## 2018-06-29 ENCOUNTER — Other Ambulatory Visit: Payer: Self-pay

## 2018-06-29 NOTE — Patient Outreach (Addendum)
  [facility]) Care Management Chronic Special Needs Program  06/29/2018  Name: [name] DOB: 1923/06/21  MRN: [id number]  Mr. Evan [name] is enrolled in a chronic special needs plan for Heart Failure. Chronic Care Management Coordinator telephoned client to review health risk assessment and to develop individualized care plan.  Introduced the chronic care management program, importance of client participation, and taking their care plan to all provider appointments and inpatient facilities.    Subjective: Client reports a history of heart failure, diabetes, heart disease, heart attack. Client also with a history of stroke, atrial fibrillation, HTN. Client lives in [facility]. He reports he is independent and does not need any assistance with ADL's. Client reports [facility] provided his medications to him in blister packs. He states that his daughter is supportive, but lives out of town. Client reports he weighs self daily and knows how to get in contact with his cardiologist if he has any signs/symptoms of exacerbation. Client reports area on left heel, he is calling a "heel spur"-upcoming appointment 07/06/2018 with the wound center.  Goals Addressed            This Visit's Progress   . COMPLETED: Client understands the importance of follow-up with providers by attending scheduled visits       Client voiced understanding.    . COMPLETED: Client verbalizes knowledge of Heart Attack self management skills within 9 months       Attends provider visits, takes medications as prescribed; signs symptoms; verbalized cardiologist and knowledge of how  and when to contact.     . COMPLETED: Client will have food insecurities addressed within the next 3 months.       Meals per independent living facility.    . Client will not report change from baseline and no repeated symptoms of stroke with in the next 9 months    On track   . Client will report  no fall or injuries in the next 6 months.   On track   . Client will report no worsening of symptoms of Atrial Fibrillation within the next 9 months   On track   . Client will report no worsening of symptoms related to heart disease within the next 9 months    On track   . Client will verbalize knowledge of self management of Hypertension as evidences by BP reading of 140/90 or less; or as defined by provider   On track   . Maintain timely refills of Heart Failure medication as prescribed within the year    On track    I am making a referral to [place] Management Pharmacist to review your medication to assist as needed.     . Visit Primary Care Provider or Cardiologist at least 2 times per year   On track    Telemedicine visit 06/08/2018 Primary care visit 06/27/2018      Covid -19 precautions discussed, client encouraged to call 24 hour nurse advice line as needed. Also encouraged to call health care concierge with benefits questions. RNCM encouraged to call RNCM as needed.   [facility] pharmacy currently involved for medication assistance with Entresto.   Plan: RNCM will follow up in 3 months. Continue care coordination with [facility] pharmacy.   Thea Silversmith, RN, MSN, [place] [phone]

## 2018-07-04 ENCOUNTER — Other Ambulatory Visit: Payer: Self-pay | Admitting: Pharmacy Technician

## 2018-07-04 NOTE — Patient Outreach (Signed)
[place] [facility]) Care Management  07/04/2018  Evan [name] 08-03-1923 [id number]    Addendum Incoming call received from patient, HIPAA identifiers verified.  Patient was calling to inform me that he had spoken to [employer] about his Arboriculturist. He was informed that the medication was being sent UPS and would be delivered Wednesday July 06, 2018. Patient inquired if he needed to  contact his pharmacy and have the Entresto removed from his pill packs. Informed patient that he would need to let his pharmacy know that he was receiving patient assistance for that medication and that it would no longer be needed in his pill packs. Patient verbalized understanding.  Informed patient that I would followup with him after the holiday concerning his Merck application. Patient verbalized understanding.  Plan to followup with patient and [employer] in 5-7 business days.  Alitza Cowman P. Oni Dietzman, [place] Management [phone]

## 2018-07-04 NOTE — Patient Outreach (Signed)
[place] [facility]) Care Management  07/04/2018  Evan [name] 1923/03/07 [id number]   Successful outreach call placed to patient in regards to [employer] application for [employer] and [employer] for [employer].  Spoke to patient, HIPAA identifiers verified.  Patient informed he mailed back his attestation form either on Tuesday or Wednesday of last week as his daughter was in town to help with the attestation form. Informed patient I would followup with [employer] after the holiday and would be in touch with him concerning the status of that application. Patient verbalized understanding.  Informed patient that he would need to call into [employer] to set up his 1st shipment of Entresto. Provided patient phone number to [employer]. Patient verbalized understanding and informed he would call them today.  Will followup in 5-7 business days.  Avo Schlachter P. Jalina Blowers, [place] Management [phone]

## 2018-07-04 NOTE — Patient Outreach (Signed)
[place] [facility]) Care Management  07/04/2018  Evan [name] 08-04-1923 [id number]   Care coordination call placed to [employer] patient assistance in regards to patient's Januvia application and Novartis patient assistance in regards to patient's [facility] application.  Spoke to Baldwin at [employer] who informed they have not received the patient's attestation form. Informed Murray Hodgkins that patient had just mailed it in last Tues/Wed (June 23/24) per conversation with patient today. Murray Hodgkins advised to call back sometime next week as they will be closed on Jul 3.  Care coordination call placed to Nelda at [employer] in regards to patient's application for Entresto. Nelda informed medication is ready to be shipped but that the patient needs to call in to set up 1st shipment delivery. She informed patient to call [phone].  Will outreach patient of this information.  Marylene Masek P. Camilah Spillman, [place] Management [phone]

## 2018-07-05 DIAGNOSIS — M79671 Pain in right foot: Secondary | ICD-10-CM | POA: Diagnosis not present

## 2018-07-05 DIAGNOSIS — M7732 Calcaneal spur, left foot: Secondary | ICD-10-CM | POA: Diagnosis not present

## 2018-07-05 DIAGNOSIS — M79672 Pain in left foot: Secondary | ICD-10-CM | POA: Diagnosis not present

## 2018-07-05 DIAGNOSIS — L97422 Non-pressure chronic ulcer of left heel and midfoot with fat layer exposed: Secondary | ICD-10-CM | POA: Diagnosis not present

## 2018-07-05 NOTE — Progress Notes (Signed)
Subjective:  Patient ID: Evan [name], male    DOB: 07/11/1923,  MRN: [id number]  Chief Complaint  Patient presents with  . Plantar Fasciitis    Pt states injection only effective for 2-3 days and had no lasting benefits. Pt still has extreme pain, such that he cannot wear shoes.  . Wound Check    Pt states left posterior heel cuts, only minor blood drainage, 2wk duration.    85 y.o. male presents with the above complaint.  History above confirmed with patient  Review of Systems: Negative except as noted in the HPI. Denies N/V/F/Ch.  Past Medical History:  Diagnosis Date  . AC joint dislocation   . Allergic rhinitis   . Arthritis   . Atrial tachycardia ([place])   . BPH (benign prostatic hyperplasia)   . CAD   . CHF (congestive heart failure) ([place])   . DM    type 2  . GERD (gastroesophageal reflux disease)   . H/O: GI bleed   . HYPERCHOLESTEROLEMIA   . HYPERTENSION   . Hypothyroidism   . Iron deficiency anemia   . NEPHROLITHIASIS, HX OF   . Paroxysmal atrial fibrillation (HCC)   . Rib fracture   . TRANSIENT ISCHEMIC ATTACKS, HX OF   . 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 Vertigo     Current Outpatient Medications:  .  acetaminophen (TYLENOL) 325 MG tablet, Take 650 mg by mouth every 6 (six) hours as needed for mild pain., Disp: , Rfl:  .  atorvastatin (LIPITOR) 10 MG tablet, Take 1 tablet (10 mg total) by mouth daily., Disp: 30 tablet, Rfl: 0 .  carvedilol (COREG) 12.5 MG tablet, Take 1 tablet (12.5 mg total) by mouth 2 (two) times daily with a meal., Disp: 60 tablet, Rfl: 0 .  clopidogrel (PLAVIX) 75 MG tablet, TAKE 1 TABLET BY MOUTH EVERY DAY WITH BREAKFAST, Disp: 90 tablet, Rfl: 2 .  Cobalamine Combinations (VITAMIN B12-FOLIC ACID PO), Take 1 tablet by mouth every Monday, Wednesday, and Friday., Disp: , Rfl:  .  divalproex (DEPAKOTE ER) 250 MG 24 hr tablet, Take 1 tablet (250 mg total) by mouth at bedtime., Disp: 30 tablet, Rfl: 0 .   ENTRESTO 24-26 MG, TAKE 1 TABLET BY MOUTH 2 TIMES DAILY, Disp: 60 tablet, Rfl: 9 .  ferrous sulfate 324 (65 Fe) MG TBEC, Take 1 tablet by mouth 2 (two) times daily., Disp: , Rfl:  .  finasteride (PROSCAR) 5 MG tablet, Take 1 tablet (5 mg total) by mouth daily., Disp: 30 tablet, Rfl: 0 .  folic acid (FOLVITE) 174 MCG tablet, Take 400 mcg by mouth 3 (three) times a week. M, Wed,Fri, Disp: , Rfl:  .  gabapentin (NEURONTIN) 100 MG capsule, Take 100 mg by mouth 2 (two) times daily., Disp: , Rfl:  .  isosorbide mononitrate (IMDUR) 30 MG 24 hr tablet, TAKE 1 TABLET BY MOUTH EVERY DAY, Disp: 90 tablet, Rfl: 1 .  levothyroxine (SYNTHROID, LEVOTHROID) 112 MCG tablet, Take 1 tablet (112 mcg total) by mouth daily before breakfast., Disp: 30 tablet, Rfl: 0 .  Loperamide HCl (IMODIUM PO), Take by mouth as directed., Disp: , Rfl:  .  MELATONIN PO, Take 10 mg by mouth at bedtime as needed., Disp: , Rfl:  .  nitroGLYCERIN (NITROSTAT) 0.4 MG SL tablet, PLACE 1 TABLET (0.4 MG TOTAL) UNDER THE TONGUE EVERY 5 (FIVE) MINUTES AS NEEDED FOR CHEST PAIN., Disp: 25 tablet, Rfl: 0 .  saccharomyces boulardii (  FLORASTOR) 250 MG capsule, Take 1 capsule (250 mg total) by mouth 2 (two) times daily., Disp: 60 capsule, Rfl: 0 .  sitaGLIPtin (JANUVIA) 25 MG tablet, Take 1 tablet (25 mg total) by mouth daily. Take one tablet by mouth once daily to control blood sugar, Disp: 30 tablet, Rfl: 0 .  torsemide (DEMADEX) 20 MG tablet, Take 2 tablets (40 mg total) by mouth daily., Disp: 180 tablet, Rfl: 1 .  amiodarone (PACERONE) 200 MG tablet, TAKE 1/2 TABLET BY MOUTH EVERY DAY, Disp: 45 tablet, Rfl: 2  Social History   Tobacco Use  Smoking Status Former Smoker  . Quit date: 01/06/1956  . Years since quitting: 62.5  Smokeless Tobacco Never Used    Allergies  Allergen Reactions  . Clonidine   . Clonidine Derivatives     unknown reaction   . Codeine     unknown reaction   . Meperidine   . Meperidine Hcl     REACTION: Hypotension   . Metformin   . Motrin [Ibuprofen]     unknown reaction   . Other     Cel[place] - unknown reaction  . Penicillins     unknown reaction   . Demerol Other (See Comments)    hypotension  . Heparin Other (See Comments)    hypotension  . Metformin And Related Diarrhea   Objective:  There were no vitals filed for this visit. There is no height or weight on file to calculate BMI. Constitutional Well developed. Well nourished.  Vascular Dorsalis pedis pulses palpable bilaterally. Posterior tibial pulses palpable bilaterally. Capillary refill normal to all digits.  No cyanosis or clubbing noted. Pedal hair growth normal.  Neurologic Normal speech. Oriented to person, place, and time. Epicritic sensation to light touch grossly present bilaterally.  Dermatologic Nails well groomed and normal in appearance. Multiple small skin fissures of the posterior aspect of the left heel  Orthopedic:  No pain palpation about the medial calcaneal tubercle   Radiographs: none Assessment:   1. Plantar fasciitis, left   2. Fissured skin    Plan:  Patient was evaluated and treated and all questions answered.  Heel fissure left -At this point the plantar fascial component appears to be resolved.  Now he appears to only have a fissure.  Discussed the importance of moisturizing.  Follow-up in a month for recheck.  Return in about 1 month (around 07/16/2018) for Heel fissure f/u .

## 2018-07-06 ENCOUNTER — Other Ambulatory Visit: Payer: Self-pay | Admitting: Pharmacy Technician

## 2018-07-06 NOTE — Patient Outreach (Signed)
[place] [facility]) Care Management  07/06/2018  Evan [name] Oct 16, 1923 [id number]  Successful outreach call placed to patient in regards to [employer] application for [employer].  Spoke to patient, HIPAA identifiers verified.  Patient informed as of right now [employer] has not delivered his Entresto. He suspects that they will deliver it either later today or tomorrow.  Will followup with patient in 3-7 business days to confirm receipt of medication.  Evan [name] P. Pearlee Arvizu, [place] Management [phone]

## 2018-07-07 ENCOUNTER — Other Ambulatory Visit: Payer: Self-pay | Admitting: Pharmacy Technician

## 2018-07-07 ENCOUNTER — Encounter (HOSPITAL_BASED_OUTPATIENT_CLINIC_OR_DEPARTMENT_OTHER): Payer: HMO | Attending: Internal Medicine

## 2018-07-07 DIAGNOSIS — I480 Paroxysmal atrial fibrillation: Secondary | ICD-10-CM | POA: Diagnosis not present

## 2018-07-07 DIAGNOSIS — Z7984 Long term (current) use of oral hypoglycemic drugs: Secondary | ICD-10-CM | POA: Diagnosis not present

## 2018-07-07 DIAGNOSIS — E11621 Type 2 diabetes mellitus with foot ulcer: Secondary | ICD-10-CM | POA: Insufficient documentation

## 2018-07-07 DIAGNOSIS — Z951 Presence of aortocoronary bypass graft: Secondary | ICD-10-CM | POA: Insufficient documentation

## 2018-07-07 DIAGNOSIS — Z79899 Other long term (current) drug therapy: Secondary | ICD-10-CM | POA: Diagnosis not present

## 2018-07-07 DIAGNOSIS — E1142 Type 2 diabetes mellitus with diabetic polyneuropathy: Secondary | ICD-10-CM | POA: Insufficient documentation

## 2018-07-07 DIAGNOSIS — I509 Heart failure, unspecified: Secondary | ICD-10-CM | POA: Insufficient documentation

## 2018-07-07 DIAGNOSIS — L89620 Pressure ulcer of left heel, unstageable: Secondary | ICD-10-CM | POA: Diagnosis not present

## 2018-07-07 DIAGNOSIS — I110 Hypertensive heart disease with heart failure: Secondary | ICD-10-CM | POA: Diagnosis not present

## 2018-07-07 DIAGNOSIS — Z955 Presence of coronary angioplasty implant and graft: Secondary | ICD-10-CM | POA: Insufficient documentation

## 2018-07-07 DIAGNOSIS — E1151 Type 2 diabetes mellitus with diabetic peripheral angiopathy without gangrene: Secondary | ICD-10-CM | POA: Diagnosis not present

## 2018-07-07 DIAGNOSIS — L97422 Non-pressure chronic ulcer of left heel and midfoot with fat layer exposed: Secondary | ICD-10-CM | POA: Insufficient documentation

## 2018-07-07 DIAGNOSIS — I2510 Atherosclerotic heart disease of native coronary artery without angina pectoris: Secondary | ICD-10-CM | POA: Insufficient documentation

## 2018-07-07 DIAGNOSIS — L89312 Pressure ulcer of right buttock, stage 2: Secondary | ICD-10-CM | POA: Diagnosis not present

## 2018-07-07 DIAGNOSIS — Z9181 History of falling: Secondary | ICD-10-CM | POA: Diagnosis not present

## 2018-07-07 DIAGNOSIS — L89629 Pressure ulcer of left heel, unspecified stage: Secondary | ICD-10-CM | POA: Diagnosis present

## 2018-07-07 NOTE — Patient Outreach (Signed)
[place] [facility]) Care Management  07/07/2018  ANDERSEN IORIO 1923-05-24 [id number]    Care coordination call placed to [employer] patient assistance in regards to patient's Januvia application.  Spoke to [place] who informed patient had been APPROVED 07/04/2018-01/05/2019. For more specific delivery information, Adonis Huguenin suggested calling back next week otherwise patient could expect the medication in 10-14 business days.  Will followup with patient in 10-14 business days to inquire if medication was received.  Rashun Grattan P. Vickie Ponds, [place] Management [phone]

## 2018-07-11 DIAGNOSIS — I110 Hypertensive heart disease with heart failure: Secondary | ICD-10-CM | POA: Diagnosis not present

## 2018-07-11 DIAGNOSIS — Z8614 Personal history of Methicillin resistant Staphylococcus aureus infection: Secondary | ICD-10-CM | POA: Diagnosis not present

## 2018-07-11 DIAGNOSIS — Z7902 Long term (current) use of antithrombotics/antiplatelets: Secondary | ICD-10-CM | POA: Diagnosis not present

## 2018-07-11 DIAGNOSIS — E1142 Type 2 diabetes mellitus with diabetic polyneuropathy: Secondary | ICD-10-CM | POA: Diagnosis not present

## 2018-07-11 DIAGNOSIS — N400 Enlarged prostate without lower urinary tract symptoms: Secondary | ICD-10-CM | POA: Diagnosis not present

## 2018-07-11 DIAGNOSIS — Z9181 History of falling: Secondary | ICD-10-CM | POA: Diagnosis not present

## 2018-07-11 DIAGNOSIS — E039 Hypothyroidism, unspecified: Secondary | ICD-10-CM | POA: Diagnosis not present

## 2018-07-11 DIAGNOSIS — Z8781 Personal history of (healed) traumatic fracture: Secondary | ICD-10-CM | POA: Diagnosis not present

## 2018-07-11 DIAGNOSIS — I2510 Atherosclerotic heart disease of native coronary artery without angina pectoris: Secondary | ICD-10-CM | POA: Diagnosis not present

## 2018-07-11 DIAGNOSIS — J309 Allergic rhinitis, unspecified: Secondary | ICD-10-CM | POA: Diagnosis not present

## 2018-07-11 DIAGNOSIS — M19041 Primary osteoarthritis, right hand: Secondary | ICD-10-CM | POA: Diagnosis not present

## 2018-07-11 DIAGNOSIS — Z87891 Personal history of nicotine dependence: Secondary | ICD-10-CM | POA: Diagnosis not present

## 2018-07-11 DIAGNOSIS — M19042 Primary osteoarthritis, left hand: Secondary | ICD-10-CM | POA: Diagnosis not present

## 2018-07-11 DIAGNOSIS — E1136 Type 2 diabetes mellitus with diabetic cataract: Secondary | ICD-10-CM | POA: Diagnosis not present

## 2018-07-11 DIAGNOSIS — I5020 Unspecified systolic (congestive) heart failure: Secondary | ICD-10-CM | POA: Diagnosis not present

## 2018-07-11 DIAGNOSIS — D509 Iron deficiency anemia, unspecified: Secondary | ICD-10-CM | POA: Diagnosis not present

## 2018-07-11 DIAGNOSIS — Z951 Presence of aortocoronary bypass graft: Secondary | ICD-10-CM | POA: Diagnosis not present

## 2018-07-11 DIAGNOSIS — I480 Paroxysmal atrial fibrillation: Secondary | ICD-10-CM | POA: Diagnosis not present

## 2018-07-11 DIAGNOSIS — K219 Gastro-esophageal reflux disease without esophagitis: Secondary | ICD-10-CM | POA: Diagnosis not present

## 2018-07-11 DIAGNOSIS — E1151 Type 2 diabetes mellitus with diabetic peripheral angiopathy without gangrene: Secondary | ICD-10-CM | POA: Diagnosis not present

## 2018-07-11 DIAGNOSIS — L89620 Pressure ulcer of left heel, unstageable: Secondary | ICD-10-CM | POA: Diagnosis not present

## 2018-07-11 DIAGNOSIS — H3530 Unspecified macular degeneration: Secondary | ICD-10-CM | POA: Diagnosis not present

## 2018-07-13 ENCOUNTER — Other Ambulatory Visit (HOSPITAL_COMMUNITY): Payer: Self-pay | Admitting: Internal Medicine

## 2018-07-13 ENCOUNTER — Ambulatory Visit (HOSPITAL_COMMUNITY)
Admission: RE | Admit: 2018-07-13 | Discharge: 2018-07-13 | Disposition: A | Discharge status: Home / Self Care | Payer: HMO | Source: Ambulatory Visit | Attending: Family | Admitting: Family

## 2018-07-13 ENCOUNTER — Other Ambulatory Visit: Payer: Self-pay | Admitting: Pharmacy Technician

## 2018-07-13 ENCOUNTER — Other Ambulatory Visit: Payer: Self-pay

## 2018-07-13 DIAGNOSIS — L89620 Pressure ulcer of left heel, unstageable: Secondary | ICD-10-CM | POA: Diagnosis not present

## 2018-07-13 NOTE — Patient Outreach (Signed)
[place] [facility]) Care Management  07/13/2018  Evan [name] 04-15-1923 [id number]    Care coordiantion call placed to [employer] patient assistance in regards to patient's Januvia application.  Spoke to Mickel Baas who informed 90 days supply was shipped on 7/2 and is in transit with an expected delivery of 07/15/2018. The tracking number is [id number].  Will followup with patient in 2-5 business days to confirm receipt of Januvia and also to confirm receipt of Entresto.  Glynda Soliday P. Vashon Arch, [place] Management [phone]

## 2018-07-15 ENCOUNTER — Encounter (HOSPITAL_BASED_OUTPATIENT_CLINIC_OR_DEPARTMENT_OTHER): Payer: HMO | Attending: Internal Medicine

## 2018-07-15 DIAGNOSIS — L89312 Pressure ulcer of right buttock, stage 2: Secondary | ICD-10-CM | POA: Diagnosis not present

## 2018-07-15 DIAGNOSIS — L97422 Non-pressure chronic ulcer of left heel and midfoot with fat layer exposed: Secondary | ICD-10-CM | POA: Diagnosis not present

## 2018-07-15 DIAGNOSIS — E11621 Type 2 diabetes mellitus with foot ulcer: Secondary | ICD-10-CM | POA: Diagnosis not present

## 2018-07-15 DIAGNOSIS — I998 Other disorder of circulatory system: Secondary | ICD-10-CM | POA: Diagnosis not present

## 2018-07-15 DIAGNOSIS — L97429 Non-pressure chronic ulcer of left heel and midfoot with unspecified severity: Secondary | ICD-10-CM | POA: Diagnosis not present

## 2018-07-18 ENCOUNTER — Other Ambulatory Visit: Payer: Self-pay | Admitting: Pharmacy Technician

## 2018-07-18 NOTE — Patient Outreach (Signed)
[place] [facility]) Care Management  07/18/2018  Evan [name] Jun 07, 1923 [id number]   Successful outreach call placed to patient in regards to [employer] application for [employer] and Scientist, clinical (histocompatibility and immunogenetics) for [employer] to patient, HIPAA identifiers verified.  Patient informed he received a 90 days supply of Entresto. He informed he was going to use up his packs that have the Entresto in it before using the Entresto from [employer]. He informed he has contacted his pharmacy and has requested they leave the [name] out of his packs and that he would self medicate the [facility].  He also informed he has not received the Januvia but that he would be looking for it in the mail. Informed patient that it should arrive USPS this week sometime based on tracking number [id number]. Patient informed once he has received it then he would do the same process as above with contacting the pharmacy.  Will followup with patient in 5-10 business days to inquire if Januvia arrived.  Oluwatomisin Deman P. Modelle Vollmer, [place] Management [phone]

## 2018-07-19 DIAGNOSIS — E11621 Type 2 diabetes mellitus with foot ulcer: Secondary | ICD-10-CM | POA: Diagnosis not present

## 2018-07-19 DIAGNOSIS — L89620 Pressure ulcer of left heel, unstageable: Secondary | ICD-10-CM | POA: Diagnosis not present

## 2018-07-20 ENCOUNTER — Other Ambulatory Visit: Payer: Self-pay

## 2018-07-20 ENCOUNTER — Ambulatory Visit: Payer: Self-pay | Admitting: Pharmacist

## 2018-07-20 ENCOUNTER — Other Ambulatory Visit: Payer: Self-pay | Admitting: *Deleted

## 2018-07-20 ENCOUNTER — Ambulatory Visit (INDEPENDENT_AMBULATORY_CARE_PROVIDER_SITE_OTHER): Payer: HMO | Admitting: Vascular Surgery

## 2018-07-20 ENCOUNTER — Encounter: Payer: Self-pay | Admitting: Vascular Surgery

## 2018-07-20 ENCOUNTER — Encounter: Payer: Self-pay | Admitting: *Deleted

## 2018-07-20 VITALS — BP 119/57 | HR 62 | Temp 98.4°F | Resp 16 | Ht 67.0 in | Wt 161.8 lb

## 2018-07-20 DIAGNOSIS — I739 Peripheral vascular disease, unspecified: Secondary | ICD-10-CM | POA: Diagnosis not present

## 2018-07-20 NOTE — Progress Notes (Signed)
Referring Physician: Dr Dellia Nims  Patient name: Evan [name] MRN: [id number] DOB: 10-03-1923 Sex: male  REASON FOR CONSULT: Nonhealing wound left foot  HPI: Evan [name] is a 85 y.o. male, with a 53-month history of pain in his left foot as well as a left foot ulcer.  The patient does not know how the ulcer started.  He was initially seen at the wound center and sent for further evaluation after an arterial study showed poor perfusion of his left leg.  Patient has had bilateral lower extremity edema over the last few months as well.  He states the pain in his left foot is almost constant.  Most of his day consists of some walking around the house but otherwise in his recliner.  Other medical problems include atrial fibrillation, coronary artery disease, congestive heart failure, diabetes, elevated cholesterol, high blood pressure.  All of these are currently controlled.  Patient is on Plavix but not on systemic anticoagulation due to his age and risk of injury.  He is a former smoker but quit in 1958.  He previously had vein harvested from the right leg for coronary artery bypass grafting.  Most recent echo showed ejection fraction 25 to 30%.  Most recent serum creatinine was 1.48 on March 16, 2017.  Past Medical History:  Diagnosis Date  . AC joint dislocation   . Allergic rhinitis   . Arthritis   . Atrial tachycardia ([place])   . BPH (benign prostatic hyperplasia)   . CAD   . CHF (congestive heart failure) ([place])   . DM    type 2  . GERD (gastroesophageal reflux disease)   . H/O: GI bleed   . HYPERCHOLESTEROLEMIA   . HYPERTENSION   . Hypothyroidism   . Iron deficiency anemia   . NEPHROLITHIASIS, HX OF   . Paroxysmal atrial fibrillation (HCC)   . Rib fracture   . TRANSIENT ISCHEMIC ATTACKS, HX OF   . 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 Vertigo    Past Surgical History:  Procedure Laterality Date  . AV FISTULA REPAIR    . BACK SURGERY  2000'S  .  CARDIAC CATHETERIZATION  12/11/2013   Procedure: CORONARY STENT INTERVENTION;  Surgeon: Burnell Blanks, MD;  Location: [facility] CATH LAB;  Service: Cardiovascular;;  DES 3.5 x 15 Resolute to seq SVG of OM1/OM2   . CORONARY ARTERY BYPASS GRAFT  1996  . CORONARY STENT PLACEMENT  12/11/13   SVG-OM DES  . FEMORAL ARTERY ANEURYSM REPAIR     feroral pseudo-aneurysm repair  . INSERT / REPLACE / REMOVE PACEMAKER    . LEFT HEART CATHETERIZATION WITH CORONARY/GRAFT ANGIOGRAM N/A 12/11/2013   Procedure: LEFT HEART CATHETERIZATION WITH Beatrix Fetters;  Surgeon: Burnell Blanks, MD;  Location: [facility] CATH LAB;  Service: Cardiovascular;  Laterality: N/A;  . PERMANENT PACEMAKER INSERTION N/A 12/12/2013   MDT ADDRL1 pacemaker implnated by Dr Rayann Heman  . PTCA  1980's    Family History  Problem Relation Age of Onset  . Diabetes Mother   . Coronary artery disease Father     SOCIAL HISTORY: Social History   Socioeconomic History  . Marital status: Widowed    Spouse name: Not on file  . Number of children: Not on file  . Years of education: Not on file  . Highest education level: Not on file  Occupational History  . Not on file  Social Needs  . Financial resource strain: Not  on file  . Food insecurity    Worry: Not on file    Inability: Not on file  . Transportation needs    Medical: Not on file    Non-medical: Not on file  Tobacco Use  . Smoking status: Former Smoker    Quit date: 01/06/1956    Years since quitting: 62.5  . Smokeless tobacco: Never Used  Substance and Sexual Activity  . Alcohol use: No    Alcohol/week: 0.0 standard drinks  . Drug use: No  . Sexual activity: Not Currently  Lifestyle  . Physical activity    Days per week: Not on file    Minutes per session: Not on file  . Stress: Not on file  Relationships  . Social Herbalist on phone: Not on file    Gets together: Not on file    Attends religious service: Not on file    Active member of club  or organization: Not on file    Attends meetings of clubs or organizations: Not on file    Relationship status: Not on file  . Intimate partner violence    Fear of current or ex partner: Not on file    Emotionally abused: Not on file    Physically abused: Not on file    Forced sexual activity: Not on file  Other Topics Concern  . Not on file  Social History Narrative   Pt is married and lives with his wife.  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      Allergies  Allergen Reactions  . Clonidine   . Clonidine Derivatives     unknown reaction   . Codeine     unknown reaction   . Meperidine   . Meperidine Hcl     REACTION: Hypotension  . Metformin   . Motrin [Ibuprofen]     unknown reaction   . Other     Celery - unknown reaction  . Penicillins     unknown reaction   . Demerol Other (See Comments)    hypotension  . Heparin Other (See Comments)    hypotension  . Metformin And Related Diarrhea    Current Outpatient Medications  Medication Sig Dispense Refill  . acetaminophen (TYLENOL) 325 MG tablet Take 650 mg by mouth every 6 (six) hours as needed for mild pain.    Marland Kitchen amiodarone (PACERONE) 200 MG tablet TAKE 1/2 TABLET BY MOUTH EVERY DAY 45 tablet 2  . atorvastatin (LIPITOR) 10 MG tablet Take 1 tablet (10 mg total) by mouth daily. 30 tablet 0  . carvedilol (COREG) 12.5 MG tablet Take 1 tablet (12.5 mg total) by mouth 2 (two) times daily with a meal. 60 tablet 0  . clopidogrel (PLAVIX) 75 MG tablet TAKE 1 TABLET BY MOUTH EVERY DAY WITH BREAKFAST 90 tablet 2  . Cobalamine Combinations (VITAMIN B12-FOLIC ACID PO) Take 1 tablet by mouth every Monday, Wednesday, and Friday.    . divalproex (DEPAKOTE ER) 250 MG 24 hr tablet Take 1 tablet (250 mg total) by mouth at bedtime. 30 tablet 0  . ENTRESTO 24-26 MG TAKE 1 TABLET BY MOUTH 2 TIMES DAILY 60 tablet 9  . ferrous sulfate 324 (65 Fe) MG TBEC Take 1 tablet by mouth 2 (two) times  daily.    . finasteride (PROSCAR) 5 MG tablet Take 1 tablet (5 mg total) by mouth daily. 30 tablet 0  . folic acid (FOLVITE)  400 MCG tablet Take 400 mcg by mouth 3 (three) times a week. M, Wed,Fri    . gabapentin (NEURONTIN) 100 MG capsule Take 100 mg by mouth 2 (two) times daily.    . isosorbide mononitrate (IMDUR) 30 MG 24 hr tablet TAKE 1 TABLET BY MOUTH EVERY DAY 90 tablet 1  . levothyroxine (SYNTHROID, LEVOTHROID) 112 MCG tablet Take 1 tablet (112 mcg total) by mouth daily before breakfast. 30 tablet 0  . Loperamide HCl (IMODIUM PO) Take by mouth as directed.    Marland Kitchen MELATONIN PO Take 10 mg by mouth at bedtime as needed.    . nitroGLYCERIN (NITROSTAT) 0.4 MG SL tablet PLACE 1 TABLET (0.4 MG TOTAL) UNDER THE TONGUE EVERY 5 (FIVE) MINUTES AS NEEDED FOR CHEST PAIN. 25 tablet 0  . saccharomyces boulardii (FLORASTOR) 250 MG capsule Take 1 capsule (250 mg total) by mouth 2 (two) times daily. 60 capsule 0  . sitaGLIPtin (JANUVIA) 25 MG tablet Take 1 tablet (25 mg total) by mouth daily. Take one tablet by mouth once daily to control blood sugar 30 tablet 0  . torsemide (DEMADEX) 20 MG tablet Take 2 tablets (40 mg total) by mouth daily. 180 tablet 1   No current facility-administered medications for this visit.     ROS:   General:  No weight loss, Fever, chills  HEENT: No recent headaches, no nasal bleeding, no visual changes, no sore throat  Neurologic: No dizziness, blackouts, seizures. No recent symptoms of stroke or mini- stroke. No recent episodes of slurred speech, or temporary blindness.  Cardiac: No recent episodes of chest pain/pressure, no shortness of breath at rest.  + shortness of breath with exertion.  Denies history of atrial fibrillation or irregular heartbeat  Vascular: No history of rest pain in feet.  No history of claudication.  No history of non-healing ulcer, No history of DVT   Pulmonary: No home oxygen, no productive cough, no hemoptysis,  No asthma or wheezing   Musculoskeletal:  [ ]  Arthritis, [ ]  Low back pain,  [ ]  Joint pain  Hematologic:No history of hypercoagulable state.  No history of easy bleeding.  No history of anemia  Gastrointestinal: No hematochezia or melena,  No gastroesophageal reflux, no trouble swallowing, history of GI bleeding in the remote past Urinary: [ ]  chronic Kidney disease, [ ]  on HD - [ ]  MWF or [ ]  TTHS, [ ]  Burning with urination, [ ]  Frequent urination, [ ]  Difficulty urinating;   Skin: No rashes  Psychological: No history of anxiety,  No history of depression   Physical Examination  Vitals:   07/20/18 1305  BP: (!) 119/57  Pulse: 62  Resp: 16  Temp: 98.4 F (36.9 C)  TempSrc: Temporal  SpO2: 99%  Weight: 161 lb 12.8 oz (73.4 kg)  Height: 5\' 7"  (1.702 m)    Body mass index is 25.34 kg/m.  General:  Alert and oriented, no acute distress, frail appearing HEENT: Normal Neck: No JVD Cardiac: Regular Rate and Rhythm Abdomen: Soft, non-tender, non-distended, no mass, no scars, mild left lower quadrant abdominal pain Skin: No rash, ulceration with dark yellow eschar left heel 2 x 2 cm an additional ulcer of similar size adjacent to this Extremity Pulses:  2+ radial, brachial, femoral, absent popliteal dorsalis pedis, posterior tibial pulses bilaterally Musculoskeletal: No deformity diffuse pretibial and pedal edema both legs  Neurologic: Upper and lower extremity motor 5/5 and symmetric  DATA:  Patient had bilateral ABIs performed.  Vessels were noncompressible bilaterally.  A limited duplex was performed which showed occlusion of the left popliteal artery.  ASSESSMENT: Peripheral arterial disease in a very frail 85 year old male now with a decubitus ulcer of his left heel at risk for limb loss.  I had a lengthy discussion with the patient and his daughter today regarding risk benefits possible complications and procedure details related arteriogram possible intervention.  I discussed with him that we  would see what we could do from a percutaneous standpoint as an intervention to improve perfusion to his left foot.  However, I do not believe that the patient is an open revascularization candidate due to his cardiac status his age and overall immobility.  If we do not have any percutaneous options we may need to discuss amputation for pain control.  We will discuss this more after his arteriogram.  Hopefully we can improve his overall perfusion for wound healing.  I also discussed with the patient and his daughter today that we need to offload the skin of the left heel and any pressure on this area is going to impede wound healing which is a notoriously bad area for wound healing in general.   PLAN: See above.  Arteriogram scheduled for August 05, 2018.   Ruta Hinds, MD [facility] [place] Office: [phone] Pager: [phone]

## 2018-07-21 ENCOUNTER — Telehealth: Payer: Self-pay | Admitting: Podiatry

## 2018-07-21 ENCOUNTER — Ambulatory Visit: Payer: HMO | Admitting: Podiatry

## 2018-07-21 NOTE — Telephone Encounter (Signed)
Pt's daughter wants to speak to you about his long term care and diagnosis. Being treated at wound care and is going for a craterization due to his poor circulation.

## 2018-07-22 DIAGNOSIS — L89629 Pressure ulcer of left heel, unspecified stage: Secondary | ICD-10-CM | POA: Diagnosis not present

## 2018-07-22 DIAGNOSIS — L89312 Pressure ulcer of right buttock, stage 2: Secondary | ICD-10-CM | POA: Diagnosis not present

## 2018-07-22 DIAGNOSIS — E11621 Type 2 diabetes mellitus with foot ulcer: Secondary | ICD-10-CM | POA: Diagnosis not present

## 2018-07-25 DIAGNOSIS — I110 Hypertensive heart disease with heart failure: Secondary | ICD-10-CM | POA: Diagnosis not present

## 2018-07-25 DIAGNOSIS — L89620 Pressure ulcer of left heel, unstageable: Secondary | ICD-10-CM | POA: Diagnosis not present

## 2018-07-25 DIAGNOSIS — Z951 Presence of aortocoronary bypass graft: Secondary | ICD-10-CM | POA: Diagnosis not present

## 2018-07-25 DIAGNOSIS — E039 Hypothyroidism, unspecified: Secondary | ICD-10-CM | POA: Diagnosis not present

## 2018-07-25 DIAGNOSIS — N400 Enlarged prostate without lower urinary tract symptoms: Secondary | ICD-10-CM | POA: Diagnosis not present

## 2018-07-25 DIAGNOSIS — K219 Gastro-esophageal reflux disease without esophagitis: Secondary | ICD-10-CM | POA: Diagnosis not present

## 2018-07-25 DIAGNOSIS — E1151 Type 2 diabetes mellitus with diabetic peripheral angiopathy without gangrene: Secondary | ICD-10-CM | POA: Diagnosis not present

## 2018-07-25 DIAGNOSIS — M19041 Primary osteoarthritis, right hand: Secondary | ICD-10-CM | POA: Diagnosis not present

## 2018-07-25 DIAGNOSIS — J309 Allergic rhinitis, unspecified: Secondary | ICD-10-CM | POA: Diagnosis not present

## 2018-07-25 DIAGNOSIS — E1136 Type 2 diabetes mellitus with diabetic cataract: Secondary | ICD-10-CM | POA: Diagnosis not present

## 2018-07-25 DIAGNOSIS — I480 Paroxysmal atrial fibrillation: Secondary | ICD-10-CM | POA: Diagnosis not present

## 2018-07-25 DIAGNOSIS — Z9181 History of falling: Secondary | ICD-10-CM | POA: Diagnosis not present

## 2018-07-25 DIAGNOSIS — Z7902 Long term (current) use of antithrombotics/antiplatelets: Secondary | ICD-10-CM | POA: Diagnosis not present

## 2018-07-25 DIAGNOSIS — Z8781 Personal history of (healed) traumatic fracture: Secondary | ICD-10-CM | POA: Diagnosis not present

## 2018-07-25 DIAGNOSIS — Z8614 Personal history of Methicillin resistant Staphylococcus aureus infection: Secondary | ICD-10-CM | POA: Diagnosis not present

## 2018-07-25 DIAGNOSIS — H3530 Unspecified macular degeneration: Secondary | ICD-10-CM | POA: Diagnosis not present

## 2018-07-25 DIAGNOSIS — E1142 Type 2 diabetes mellitus with diabetic polyneuropathy: Secondary | ICD-10-CM | POA: Diagnosis not present

## 2018-07-25 DIAGNOSIS — D509 Iron deficiency anemia, unspecified: Secondary | ICD-10-CM | POA: Diagnosis not present

## 2018-07-25 DIAGNOSIS — I5020 Unspecified systolic (congestive) heart failure: Secondary | ICD-10-CM | POA: Diagnosis not present

## 2018-07-25 DIAGNOSIS — M19042 Primary osteoarthritis, left hand: Secondary | ICD-10-CM | POA: Diagnosis not present

## 2018-07-25 DIAGNOSIS — I2510 Atherosclerotic heart disease of native coronary artery without angina pectoris: Secondary | ICD-10-CM | POA: Diagnosis not present

## 2018-07-25 DIAGNOSIS — Z87891 Personal history of nicotine dependence: Secondary | ICD-10-CM | POA: Diagnosis not present

## 2018-07-26 ENCOUNTER — Ambulatory Visit: Payer: HMO | Admitting: Podiatry

## 2018-07-26 ENCOUNTER — Other Ambulatory Visit: Payer: Self-pay | Admitting: Pharmacy Technician

## 2018-07-26 ENCOUNTER — Encounter: Payer: Self-pay | Admitting: Podiatry

## 2018-07-26 ENCOUNTER — Other Ambulatory Visit: Payer: Self-pay

## 2018-07-26 VITALS — Temp 97.7°F

## 2018-07-26 DIAGNOSIS — E1142 Type 2 diabetes mellitus with diabetic polyneuropathy: Secondary | ICD-10-CM | POA: Diagnosis not present

## 2018-07-26 DIAGNOSIS — M79675 Pain in left toe(s): Secondary | ICD-10-CM | POA: Diagnosis not present

## 2018-07-26 DIAGNOSIS — M79674 Pain in right toe(s): Secondary | ICD-10-CM

## 2018-07-26 DIAGNOSIS — M792 Neuralgia and neuritis, unspecified: Secondary | ICD-10-CM | POA: Diagnosis not present

## 2018-07-26 DIAGNOSIS — B351 Tinea unguium: Secondary | ICD-10-CM

## 2018-07-26 NOTE — Patient Outreach (Signed)
[place] [facility]) Care Management  07/26/2018  Evan [name] September 30, 1923 [id number]    Unsuccessful outreach call placed to patient in regards to [employer] application for [place].  Unfortunately patient did not answer the phone, HIPAA compliant voicemail left.  Was calling patient to inquire if he has received his Januvia. According to the tracking information provided by [employer], the medication was delivered on July 20, 2018 at 1:15pm in or at the mailbox.  Will followup with 2nd outreach attempt in 3-5 business days.  Quantae Martel P. Eather Chaires, [place] Management [phone]

## 2018-07-26 NOTE — Patient Instructions (Addendum)
Diabetes Mellitus and Foot Care Foot care is an important part of your health, especially when you have diabetes. Diabetes may cause you to have problems because of poor blood flow (circulation) to your feet and legs, which can cause your skin to:  Become thinner and drier.  Break more easily.  Heal more slowly.  Peel and crack. You may also have nerve damage (neuropathy) in your legs and feet, causing decreased feeling in them. This means that you may not notice minor injuries to your feet that could lead to more serious problems. Noticing and addressing any potential problems early is the best way to prevent future foot problems. How to care for your feet Foot hygiene  Wash your feet daily with warm water and mild soap. Do not use hot water. Then, pat your feet and the areas between your toes until they are completely dry. Do not soak your feet as this can dry your skin.  Trim your toenails straight across. Do not dig under them or around the cuticle. File the edges of your nails with an emery board or nail file.  Apply a moisturizing lotion or petroleum jelly to the skin on your feet and to dry, brittle toenails. Use lotion that does not contain alcohol and is unscented. Do not apply lotion between your toes. Shoes and socks  Wear clean socks or stockings every day. Make sure they are not too tight. Do not wear knee-high stockings since they may decrease blood flow to your legs.  Wear shoes that fit properly and have enough cushioning. Always look in your shoes before you put them on to be sure there are no objects inside.  To break in new shoes, wear them for just a few hours a day. This prevents injuries on your feet. Wounds, scrapes, corns, and calluses  Check your feet daily for blisters, cuts, bruises, sores, and redness. If you cannot see the bottom of your feet, use a mirror or ask someone for help.  Do not cut corns or calluses or try to remove them with medicine.  If you  find a minor scrape, cut, or break in the skin on your feet, keep it and the skin around it clean and dry. You may clean these areas with mild soap and water. Do not clean the area with peroxide, alcohol, or iodine.  If you have a wound, scrape, corn, or callus on your foot, look at it several times a day to make sure it is healing and not infected. Check for: ? Redness, swelling, or pain. ? Fluid or blood. ? Warmth. ? Pus or a bad smell. General instructions  Do not cross your legs. This may decrease blood flow to your feet.  Do not use heating pads or hot water bottles on your feet. They may burn your skin. If you have lost feeling in your feet or legs, you may not know this is happening until it is too late.  Protect your feet from hot and cold by wearing shoes, such as at the beach or on hot pavement.  Schedule a complete foot exam at least once a year (annually) or more often if you have foot problems. If you have foot problems, report any cuts, sores, or bruises to your health care provider immediately. Contact a health care provider if:  You have a medical condition that increases your risk of infection and you have any cuts, sores, or bruises on your feet.  You have an injury that is not   healing.  You have redness on your legs or feet.  You feel burning or tingling in your legs or feet.  You have pain or cramps in your legs and feet.  Your legs or feet are numb.  Your feet always feel cold.  You have pain around a toenail. Get help right away if:  You have a wound, scrape, corn, or callus on your foot and: ? You have pain, swelling, or redness that gets worse. ? You have fluid or blood coming from the wound, scrape, corn, or callus. ? Your wound, scrape, corn, or callus feels warm to the touch. ? You have pus or a bad smell coming from the wound, scrape, corn, or callus. ? You have a fever. ? You have a red line going up your leg. Summary  Check your feet every day  for cuts, sores, red spots, swelling, and blisters.  Moisturize feet and legs daily.  Wear shoes that fit properly and have enough cushioning.  If you have foot problems, report any cuts, sores, or bruises to your health care provider immediately.  Schedule a complete foot exam at least once a year (annually) or more often if you have foot problems. This information is not intended to replace advice given to you by your health care provider. Make sure you discuss any questions you have with your health care provider. Document Released: 12/20/1999 Document Revised: 02/03/2017 Document Reviewed: 01/24/2016 Elsevier Patient Education  2020 Elsevier Inc.   Onychomycosis/Fungal Toenails  WHAT IS IT? An infection that lies within the keratin of your nail plate that is caused by a fungus.  WHY ME? Fungal infections affect all ages, sexes, races, and creeds.  There may be many factors that predispose you to a fungal infection such as age, coexisting medical conditions such as diabetes, or an autoimmune disease; stress, medications, fatigue, genetics, etc.  Bottom line: fungus thrives in a warm, moist environment and your shoes offer such a location.  IS IT CONTAGIOUS? Theoretically, yes.  You do not want to share shoes, nail clippers or files with someone who has fungal toenails.  Walking around barefoot in the same room or sleeping in the same bed is unlikely to transfer the organism.  It is important to realize, however, that fungus can spread easily from one nail to the next on the same foot.  HOW DO WE TREAT THIS?  There are several ways to treat this condition.  Treatment may depend on many factors such as age, medications, pregnancy, liver and kidney conditions, etc.  It is best to ask your doctor which options are available to you.  1. No treatment.   Unlike many other medical concerns, you can live with this condition.  However for many people this can be a painful condition and may lead to  ingrown toenails or a bacterial infection.  It is recommended that you keep the nails cut short to help reduce the amount of fungal nail. 2. Topical treatment.  These range from herbal remedies to prescription strength nail lacquers.  About 40-50% effective, topicals require twice daily application for approximately 9 to 12 months or until an entirely new nail has grown out.  The most effective topicals are medical grade medications available through physicians offices. 3. Oral antifungal medications.  With an 80-90% cure rate, the most common oral medication requires 3 to 4 months of therapy and stays in your system for a year as the new nail grows out.  Oral antifungal medications do require   blood work to make sure it is a safe drug for you.  A liver function panel will be performed prior to starting the medication and after the first month of treatment.  It is important to have the blood work performed to avoid any harmful side effects.  In general, this medication safe but blood work is required. 4. Laser Therapy.  This treatment is performed by applying a specialized laser to the affected nail plate.  This therapy is noninvasive, fast, and non-painful.  It is not covered by insurance and is therefore, out of pocket.  The results have been very good with a 80-95% cure rate.  The [facility] is the only practice in the area to offer this therapy. 5. Permanent Nail Avulsion.  Removing the entire nail so that a new nail will not grow back. 

## 2018-07-29 ENCOUNTER — Other Ambulatory Visit: Payer: Self-pay | Admitting: Pharmacy Technician

## 2018-07-29 DIAGNOSIS — L89620 Pressure ulcer of left heel, unstageable: Secondary | ICD-10-CM | POA: Diagnosis not present

## 2018-07-29 DIAGNOSIS — L97422 Non-pressure chronic ulcer of left heel and midfoot with fat layer exposed: Secondary | ICD-10-CM | POA: Diagnosis not present

## 2018-07-29 DIAGNOSIS — E11621 Type 2 diabetes mellitus with foot ulcer: Secondary | ICD-10-CM | POA: Diagnosis not present

## 2018-07-29 NOTE — Patient Outreach (Signed)
[facility]) Care Management  07/29/2018  TYJAI MATUSZAK November 02, 1923 [id number]    Successful outreach call placed to patient in regards to [employer] application for [place].  Spoke to patient, HIPAA identifiers verified.  Patient informed he did receive his 90 days supply of Januvia. Discussed refill procedure for this medication as well as his Entresto with [employer]. Patient verbalized understanding. Patient also informed that he had called his pharmacy and requested that these 2 medications be left out of his packets due to he will be getting them from patient assistance. Patient informed he had no other questions or concerns at this time. Confirmed patient had name and number.  Will route note to [place] that patient assistance has been completed and will remove myself from care team.  Luiz Ochoa. Cecilio Ohlrich, [place] Management [phone]

## 2018-07-31 NOTE — Progress Notes (Signed)
Subjective: Evan [name] is a 85 y.o. y.o. male who presents for preventative foot care today with PAD and cc of painful, discolored, thick toenails and with daily activities. Pain is aggravated when wearing enclosed shoe geear and  is relieved with periodic professional debridement.  Evan [name] is accompanied by his daughter on today's visit. He is currently under the care of Wound Care (on Fridays)  for wound on posterior aspect of the left heel.  He has pain of the heel and has medication for it as well.   His daughter states he is scheduled for vein cath on 08/12/2018 with Dr. Oneida Alar.  He does have what daughter describes as a prevalon boot and find it does not relieve his pain. They do elevate his limb and keep pressure off of it and they pain is still present with no pressure.  Deland Pretty, MD is his PCP.    Current Outpatient Medications:  .  acetaminophen (TYLENOL) 325 MG tablet, Take 650 mg by mouth every 6 (six) hours as needed for mild pain., Disp: , Rfl:  .  amiodarone (PACERONE) 200 MG tablet, TAKE 1/2 TABLET BY MOUTH EVERY DAY, Disp: 45 tablet, Rfl: 2 .  atorvastatin (LIPITOR) 10 MG tablet, Take 1 tablet (10 mg total) by mouth daily., Disp: 30 tablet, Rfl: 0 .  carvedilol (COREG) 12.5 MG tablet, Take 1 tablet (12.5 mg total) by mouth 2 (two) times daily with a meal., Disp: 60 tablet, Rfl: 0 .  clopidogrel (PLAVIX) 75 MG tablet, TAKE 1 TABLET BY MOUTH EVERY DAY WITH BREAKFAST, Disp: 90 tablet, Rfl: 2 .  Cobalamine Combinations (VITAMIN B12-FOLIC ACID PO), Take 1 tablet by mouth every Monday, Wednesday, and Friday., Disp: , Rfl:  .  divalproex (DEPAKOTE ER) 250 MG 24 hr tablet, Take 1 tablet (250 mg total) by mouth at bedtime., Disp: 30 tablet, Rfl: 0 .  ENTRESTO 24-26 MG, TAKE 1 TABLET BY MOUTH 2 TIMES DAILY, Disp: 60 tablet, Rfl: 9 .  ferrous sulfate 324 (65 Fe) MG TBEC, Take 1 tablet by mouth 2 (two) times daily., Disp: , Rfl:  .  finasteride (PROSCAR) 5 MG tablet, Take 1  tablet (5 mg total) by mouth daily., Disp: 30 tablet, Rfl: 0 .  folic acid (FOLVITE) 381 MCG tablet, Take 400 mcg by mouth 3 (three) times a week. M, Wed,Fri, Disp: , Rfl:  .  gabapentin (NEURONTIN) 100 MG capsule, Take 100 mg by mouth 2 (two) times daily., Disp: , Rfl:  .  isosorbide mononitrate (IMDUR) 30 MG 24 hr tablet, TAKE 1 TABLET BY MOUTH EVERY DAY, Disp: 90 tablet, Rfl: 1 .  levothyroxine (SYNTHROID, LEVOTHROID) 112 MCG tablet, Take 1 tablet (112 mcg total) by mouth daily before breakfast., Disp: 30 tablet, Rfl: 0 .  Loperamide HCl (IMODIUM PO), Take by mouth as directed., Disp: , Rfl:  .  MELATONIN PO, Take 10 mg by mouth at bedtime as needed., Disp: , Rfl:  .  nitroGLYCERIN (NITROSTAT) 0.4 MG SL tablet, PLACE 1 TABLET (0.4 MG TOTAL) UNDER THE TONGUE EVERY 5 (FIVE) MINUTES AS NEEDED FOR CHEST PAIN., Disp: 25 tablet, Rfl: 0 .  saccharomyces boulardii (FLORASTOR) 250 MG capsule, Take 1 capsule (250 mg total) by mouth 2 (two) times daily., Disp: 60 capsule, Rfl: 0 .  sitaGLIPtin (JANUVIA) 25 MG tablet, Take 1 tablet (25 mg total) by mouth daily. Take one tablet by mouth once daily to control blood sugar, Disp: 30 tablet, Rfl: 0 .  torsemide (DEMADEX) 20 MG tablet,  Take 2 tablets (40 mg total) by mouth daily., Disp: 180 tablet, Rfl: 1  Allergies  Allergen Reactions  . Clonidine   . Clonidine Derivatives     unknown reaction   . Codeine     unknown reaction   . Meperidine   . Meperidine Hcl     REACTION: Hypotension  . Metformin   . Motrin [Ibuprofen]     unknown reaction   . Other     Celery - unknown reaction  . Penicillins     unknown reaction   . Demerol Other (See Comments)    hypotension  . Heparin Other (See Comments)    hypotension  . Metformin And Related Diarrhea    Objective: Vitals:   07/26/18 1452  Temp: 97.7 F (36.5 C)    Vascular Examination: Capillary refill time <3 seconds x 10 digits  Dorsalis pedis pulses present b/l.  Posterior tibial  pulses faintly palpable b/l.  Sparse digital hair x 10 digits.  Skin temperature warm to cool b/l.  Dermatological Examination: Skin warm and dry b/l.  Dressing noted left heel to be clean, dry and intact.  Toenails 1-5 b/l discolored, thick, dystrophic with subungual debris and pain with palpation to nailbeds due to thickness of nails.  Healed wound noted plantar aspect right foot.  Musculoskeletal: Muscle strength 5/5 to all LE muscle groups.  Neurological: Sensation intact 5/5 b/l with 10 gram monofilament.  Assessment: 1. Painful onychomycosis toenails 1-5 b/l 2. H/o neuropathic pain 3. Open wound left heel being managed by outpatient wound care 4. NIDDM  Plan: 1. Toenails 1-5 b/l were debrided in length and girth without iatrogenic bleeding. 2. Continue strict off-loading of left heel. 3. Patient to continue soft, supportive shoe gear daily. 4. Patient to report any pedal injuries to medical professional immediately. 5. Follow up 3 months.  6. Patient/POA to call should there be a concern in the interim.

## 2018-08-05 ENCOUNTER — Telehealth: Payer: Self-pay | Admitting: Cardiovascular Disease

## 2018-08-05 DIAGNOSIS — L89629 Pressure ulcer of left heel, unspecified stage: Secondary | ICD-10-CM | POA: Diagnosis not present

## 2018-08-05 DIAGNOSIS — E11621 Type 2 diabetes mellitus with foot ulcer: Secondary | ICD-10-CM | POA: Diagnosis not present

## 2018-08-05 NOTE — Telephone Encounter (Signed)
°  Pt c/o swelling: STAT is pt has developed SOB within 24 hours  1) How much weight have you gained and in what time span? No weight log  2) If swelling, where is the swelling located? Legs and feet   3) Are you currently taking a fluid pill? torsemide (DEMADEX) 20 MG tablet  4) Are you currently SOB? no  5) Do you have a log of your daily weights (if so, list)? no  6) Have you gained 3 pounds in a day or 5 pounds in a week? no  7) Have you traveled recently? no   The patient is dealing with a wound on his heel, and the wound is not healing. The patient was in a wound check appointment on Friday, and the Wound Doctor wants him to be treated for the additional swelling as soon as possible. The patient is retaining fluid, which may be the cause of the delayed healing.   The daughter, Morene Antu, was the one that called on behalf of the patient. She says her dad knows to weigh himself every day, but can not confirm if he is actually doing it or not.   She will also verify the dosage of torsemide that the patient is taking.   He has an appointment with Richardson Dopp on Tuesday at 2:45

## 2018-08-06 DIAGNOSIS — L97509 Non-pressure chronic ulcer of other part of unspecified foot with unspecified severity: Secondary | ICD-10-CM

## 2018-08-06 DIAGNOSIS — E11621 Type 2 diabetes mellitus with foot ulcer: Secondary | ICD-10-CM

## 2018-08-06 HISTORY — DX: Type 2 diabetes mellitus with foot ulcer: L97.509

## 2018-08-06 HISTORY — DX: Type 2 diabetes mellitus with foot ulcer: E11.621

## 2018-08-08 DIAGNOSIS — Z8614 Personal history of Methicillin resistant Staphylococcus aureus infection: Secondary | ICD-10-CM | POA: Diagnosis not present

## 2018-08-08 DIAGNOSIS — M19041 Primary osteoarthritis, right hand: Secondary | ICD-10-CM | POA: Diagnosis not present

## 2018-08-08 DIAGNOSIS — D509 Iron deficiency anemia, unspecified: Secondary | ICD-10-CM | POA: Diagnosis not present

## 2018-08-08 DIAGNOSIS — Z7902 Long term (current) use of antithrombotics/antiplatelets: Secondary | ICD-10-CM | POA: Diagnosis not present

## 2018-08-08 DIAGNOSIS — Z9181 History of falling: Secondary | ICD-10-CM | POA: Diagnosis not present

## 2018-08-08 DIAGNOSIS — I480 Paroxysmal atrial fibrillation: Secondary | ICD-10-CM | POA: Diagnosis not present

## 2018-08-08 DIAGNOSIS — I5020 Unspecified systolic (congestive) heart failure: Secondary | ICD-10-CM | POA: Diagnosis not present

## 2018-08-08 DIAGNOSIS — J309 Allergic rhinitis, unspecified: Secondary | ICD-10-CM | POA: Diagnosis not present

## 2018-08-08 DIAGNOSIS — M19042 Primary osteoarthritis, left hand: Secondary | ICD-10-CM | POA: Diagnosis not present

## 2018-08-08 DIAGNOSIS — H3530 Unspecified macular degeneration: Secondary | ICD-10-CM | POA: Diagnosis not present

## 2018-08-08 DIAGNOSIS — Z8781 Personal history of (healed) traumatic fracture: Secondary | ICD-10-CM | POA: Diagnosis not present

## 2018-08-08 DIAGNOSIS — K219 Gastro-esophageal reflux disease without esophagitis: Secondary | ICD-10-CM | POA: Diagnosis not present

## 2018-08-08 DIAGNOSIS — E1142 Type 2 diabetes mellitus with diabetic polyneuropathy: Secondary | ICD-10-CM | POA: Diagnosis not present

## 2018-08-08 DIAGNOSIS — E11621 Type 2 diabetes mellitus with foot ulcer: Secondary | ICD-10-CM | POA: Diagnosis not present

## 2018-08-08 DIAGNOSIS — Z951 Presence of aortocoronary bypass graft: Secondary | ICD-10-CM | POA: Diagnosis not present

## 2018-08-08 DIAGNOSIS — N400 Enlarged prostate without lower urinary tract symptoms: Secondary | ICD-10-CM | POA: Diagnosis not present

## 2018-08-08 DIAGNOSIS — Z87891 Personal history of nicotine dependence: Secondary | ICD-10-CM | POA: Diagnosis not present

## 2018-08-08 DIAGNOSIS — I110 Hypertensive heart disease with heart failure: Secondary | ICD-10-CM | POA: Diagnosis not present

## 2018-08-08 DIAGNOSIS — E1151 Type 2 diabetes mellitus with diabetic peripheral angiopathy without gangrene: Secondary | ICD-10-CM | POA: Diagnosis not present

## 2018-08-08 DIAGNOSIS — I2510 Atherosclerotic heart disease of native coronary artery without angina pectoris: Secondary | ICD-10-CM | POA: Diagnosis not present

## 2018-08-08 DIAGNOSIS — E039 Hypothyroidism, unspecified: Secondary | ICD-10-CM | POA: Diagnosis not present

## 2018-08-08 DIAGNOSIS — E1136 Type 2 diabetes mellitus with diabetic cataract: Secondary | ICD-10-CM | POA: Diagnosis not present

## 2018-08-08 DIAGNOSIS — L89620 Pressure ulcer of left heel, unstageable: Secondary | ICD-10-CM | POA: Diagnosis not present

## 2018-08-08 NOTE — Telephone Encounter (Signed)
LMTCB for Starre... pt has appt 08/09/18

## 2018-08-08 NOTE — Telephone Encounter (Signed)
Follow Up   Patient's daughter returning call. Please give a call back.

## 2018-08-08 NOTE — Telephone Encounter (Signed)
Pt daughter Morene Antu called to ask if the pt can have PRN diuretics for his bilateral leg edema. He has been seeing Dr. Dellia Nims wound care for a left foot sore over the past month. He has bilateral pitting edema up to his thighs. He does not have SOB. He has been taking his Demadex 40mg  a day every day. He cannot elevate his legs because of his vascular issues, it causes him pain so the edema has been worsening over the past ten days. He is now having right leg blisters which both legs now have sterile dressings which are due to be changed out this Thursday. Pt is having increased pain with ambulation. His daughter drives from [place] to take him to his appts since he lives in an independent living facility and transportation with them is difficult. Pt is scheduled to have a vascular procedure with Dr. Ruta Hinds this Friday. The wound doctor recommended he call cardiology to have his diuretics increased.   Pts last kidney function tests here were 03/2017. She has already made him an appt with Richardson Dopp PA 08/09/2018 at 2:45pm. I advised her that he needs to be seen and assessed prior to any med changes but will forward all of this information to [place] for his review prior to the appt. I also asked of she would consider taking him to the ER but she says she is in [place] and will drive here tomorrow to bring him but she would be okay with it if Nicki Reaper felt that was needed after evalutating him in the office.   Pt has not had any COVID sx and due for testing prior to his vascular procedure 08/10/18.

## 2018-08-09 ENCOUNTER — Encounter (INDEPENDENT_AMBULATORY_CARE_PROVIDER_SITE_OTHER): Payer: Self-pay

## 2018-08-09 ENCOUNTER — Other Ambulatory Visit: Payer: Self-pay

## 2018-08-09 ENCOUNTER — Ambulatory Visit: Payer: HMO | Admitting: Physician Assistant

## 2018-08-09 ENCOUNTER — Ambulatory Visit (INDEPENDENT_AMBULATORY_CARE_PROVIDER_SITE_OTHER): Payer: HMO | Admitting: *Deleted

## 2018-08-09 ENCOUNTER — Encounter: Payer: Self-pay | Admitting: Physician Assistant

## 2018-08-09 VITALS — BP 110/42 | HR 60 | Ht 67.0 in | Wt 162.0 lb

## 2018-08-09 DIAGNOSIS — I480 Paroxysmal atrial fibrillation: Secondary | ICD-10-CM | POA: Diagnosis not present

## 2018-08-09 DIAGNOSIS — Z8719 Personal history of other diseases of the digestive system: Secondary | ICD-10-CM

## 2018-08-09 DIAGNOSIS — Z950 Presence of cardiac pacemaker: Secondary | ICD-10-CM

## 2018-08-09 DIAGNOSIS — I10 Essential (primary) hypertension: Secondary | ICD-10-CM

## 2018-08-09 DIAGNOSIS — I5043 Acute on chronic combined systolic (congestive) and diastolic (congestive) heart failure: Secondary | ICD-10-CM

## 2018-08-09 DIAGNOSIS — I2510 Atherosclerotic heart disease of native coronary artery without angina pectoris: Secondary | ICD-10-CM | POA: Diagnosis not present

## 2018-08-09 DIAGNOSIS — L89620 Pressure ulcer of left heel, unstageable: Secondary | ICD-10-CM

## 2018-08-09 DIAGNOSIS — I442 Atrioventricular block, complete: Secondary | ICD-10-CM

## 2018-08-09 DIAGNOSIS — N189 Chronic kidney disease, unspecified: Secondary | ICD-10-CM | POA: Diagnosis not present

## 2018-08-09 LAB — CUP PACEART INCLINIC DEVICE CHECK
Battery Impedance: 330 Ohm
Battery Remaining Longevity: 92 mo
Battery Voltage: 2.79 V
Brady Statistic AP VP Percent: 99 %
Brady Statistic AP VS Percent: 0 %
Brady Statistic AS VP Percent: 0 %
Brady Statistic AS VS Percent: 0 %
Date Time Interrogation Session: 20200804161033
Implantable Lead Implant Date: 20151208
Implantable Lead Implant Date: 20151208
Implantable Lead Location: 753859
Implantable Lead Location: 753860
Implantable Lead Model: 5076
Implantable Lead Model: 5092
Implantable Pulse Generator Implant Date: 20151208
Lead Channel Impedance Value: 447 Ohm
Lead Channel Impedance Value: 615 Ohm
Lead Channel Pacing Threshold Amplitude: 0.5 V
Lead Channel Pacing Threshold Amplitude: 0.75 V
Lead Channel Pacing Threshold Pulse Width: 0.4 ms
Lead Channel Pacing Threshold Pulse Width: 0.4 ms
Lead Channel Setting Pacing Amplitude: 2 V
Lead Channel Setting Pacing Amplitude: 2.5 V
Lead Channel Setting Pacing Pulse Width: 0.4 ms
Lead Channel Setting Sensing Sensitivity: 4 mV

## 2018-08-09 MED ORDER — TORSEMIDE 20 MG PO TABS: 40.0000 mg | ORAL_TABLET | Freq: Two times a day (BID) | ORAL | 1 refills | Status: DC

## 2018-08-09 NOTE — Progress Notes (Signed)
Cardiology Office Note:    Date:  08/09/2018   ID:  Evan [name], DOB March 19, 1923, MRN [id number]  PCP:  Deland Pretty, MD  Cardiologist:  Jenkins Rouge, MD  Electrophysiologist:  None   Referring MD: Deland Pretty, MD   Chief Complaint  Patient presents with  . Leg Swelling    History of Present Illness:    Evan [name] is a 85 y.o. male with:  Coronary artery disease  Status post CABG in 1996  [place] >> s/p DES to the SVG-OM1/OM 2 in 2015  ASA DC'd due to hx of GI bleeding  Chronic systolic CHF (EF 40-97)  Admx 7/18 >> EF previously 45-50 >> now 25-30  Med Rx  Paroxysmal atrial fibrillation; paroxysmal atrial tachycardia  Amiodarone therapy  Frequent falls; poor candidate for anticoagulation  Complete heart block status post pacemaker  Peripheral arterial disease  L heel ulcer >> arteriogram scheduled with Dr. Oneida Alar 08/12/2018  Diabetes mellitus type 2  Hypothyroidism  Admitted 6/18 with GI bleeding; NSTEMI (pk Tn 8.21)  Admitted 2019 with PAF c/b demand ischemia (no anticoagulation due GI bleeding)  [name] was last seen by [name] via Telemedicine 06/08/2018.  He has a L heel ulcer and has seen Dr. Oneida Alar.  Plan is for a LE arteriogram this Friday.  His daughter called recently to note the patient has worsening swelling in his legs (see phone note from 08/05/2018).  He is seen for further evaluation.    He is here today with his daughter.  He has had swelling in his left leg since he started to have his left heel wound.  He goes to the wound center and has been getting his legs wrapped.  Over the past couple weeks, his daughter has noted that his right leg is now more swollen.  He really does not have any significantly worsened shortness of breath.  He has not had chest pain, syncope, orthopnea or paroxysmal nocturnal dyspnea.  He has noted some unsteadiness or lightheadedness at times.  He has not had any bleeding issues.  Prior CV studies:   The  following studies were reviewed today:  Echocardiogram 07/30/2016 EF 25-30, severe diffuse HK, grade 3 diastolic dysfunction, no evidence of thrombus, mild aortic stenosis (mean 9), mild AI, MAC, mild to moderate MR, moderate LAE, mildly reduced RV SF, moderate TR, PASP 63  Cardiac catheterization 12/11/13 Left main: 30% distal stenosis. Left Anterior Descending Artery: 100% proximal occlusion. Mid and distal LAD fills from the patent vein graft. The diagonal fills from the patent vein graft.  Circumflex Artery: 100% proximal occlusion. The first and second OM branches fill from the patent vein graft.  Right Coronary Artery: Large dominant vessel with diffuse 99% stenosis in the proximal, mid and distal vessel. The PDA and PLA fill from the patent vein graft.  Graft Anatomy:  SVG to PDA is patent SVG to OM1/OM2 is patent with 95% stenosis mid body of SVG SVG to LAD is patent SVG to Diagonal is patent Left Ventricular Angiogram: Deferred.  Impression: 1. Severe triple vessel disease s/p 5 vessel CABG with 5/5 patent bypass grafts. 2. Severe stenosis mid body of SVG to OM 3. Unstable angina 4. Successful PTCA/DES x 1 mid body of SVG to OM1/OM2  Echo 12/08/2013 EF 45-50, diffuse HK, grade 1 diastolic dysfunction, mild AI, MAC, mild LAE  Past Medical History:  Diagnosis Date  . AC joint dislocation   . Allergic rhinitis   . Arthritis   .  Atrial tachycardia ([place])   . BPH (benign prostatic hyperplasia)   . CAD   . CHF (congestive heart failure) ([place])   . DM    type 2  . GERD (gastroesophageal reflux disease)   . H/O: GI bleed   . HYPERCHOLESTEROLEMIA   . HYPERTENSION   . Hypothyroidism   . Iron deficiency anemia   . NEPHROLITHIASIS, HX OF   . Paroxysmal atrial fibrillation (HCC)   . Rib fracture   . TRANSIENT ISCHEMIC ATTACKS, HX OF   . 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 Vertigo    Surgical Hx: The patient  has a past surgical history that  includes Mitral valve replacement (1980's); Coronary artery bypass graft (1996); Back surgery (2000'S); AV fistula repair; Femoral artery aneurysm repair; Coronary stent placement (12/11/13); left heart catheterization with coronary/graft angiogram (N/A, 12/11/2013); Cardiac catheterization (12/11/2013); permanent pacemaker insertion (N/A, 12/12/2013); and Insert / replace / remove pacemaker.   Current Medications: Current Meds  Medication Sig  . acetaminophen (TYLENOL) 325 MG tablet Take 650 mg by mouth every 6 (six) hours as needed for mild pain.  Marland Kitchen amiodarone (PACERONE) 200 MG tablet TAKE 1/2 TABLET BY MOUTH EVERY DAY  . atorvastatin (LIPITOR) 10 MG tablet Take 1 tablet (10 mg total) by mouth daily.  . carvedilol (COREG) 12.5 MG tablet Take 1 tablet (12.5 mg total) by mouth 2 (two) times daily with a meal.  . clopidogrel (PLAVIX) 75 MG tablet TAKE 1 TABLET BY MOUTH EVERY DAY WITH BREAKFAST  . Cobalamine Combinations (VITAMIN B12-FOLIC ACID PO) Take 1 tablet by mouth every Monday, Wednesday, and Friday.  . divalproex (DEPAKOTE ER) 250 MG 24 hr tablet Take 1 tablet (250 mg total) by mouth at bedtime.  Marland Kitchen ENTRESTO 24-26 MG TAKE 1 TABLET BY MOUTH 2 TIMES DAILY  . ferrous sulfate 324 (65 Fe) MG TBEC Take 1 tablet by mouth 2 (two) times daily.  . finasteride (PROSCAR) 5 MG tablet Take 1 tablet (5 mg total) by mouth daily.  . folic acid (FOLVITE) 503 MCG tablet Take 400 mcg by mouth 3 (three) times a week. M, Wed,Fri  . gabapentin (NEURONTIN) 100 MG capsule Take 100 mg by mouth 2 (two) times daily.  . isosorbide mononitrate (IMDUR) 30 MG 24 hr tablet TAKE 1 TABLET BY MOUTH EVERY DAY  . levothyroxine (SYNTHROID, LEVOTHROID) 112 MCG tablet Take 1 tablet (112 mcg total) by mouth daily before breakfast.  . Loperamide HCl (IMODIUM PO) Take by mouth as directed.  Marland Kitchen MELATONIN PO Take 10 mg by mouth at bedtime as needed.  . nitroGLYCERIN (NITROSTAT) 0.4 MG SL tablet PLACE 1 TABLET (0.4 MG TOTAL) UNDER THE TONGUE  EVERY 5 (FIVE) MINUTES AS NEEDED FOR CHEST PAIN.  Marland Kitchen saccharomyces boulardii (FLORASTOR) 250 MG capsule Take 1 capsule (250 mg total) by mouth 2 (two) times daily.  . sitaGLIPtin (JANUVIA) 25 MG tablet Take 1 tablet (25 mg total) by mouth daily. Take one tablet by mouth once daily to control blood sugar  . torsemide (DEMADEX) 20 MG tablet Take 2 tablets (40 mg total) by mouth 2 (two) times daily. For one week.  If swelling decreases pt to go back one tablet (40 mg ) daily. If still edema stay on (40 mg) twice daily till pt come in for ov.  . [DISCONTINUED] torsemide (DEMADEX) 20 MG tablet Take 2 tablets (40 mg total) by mouth daily.     Allergies:   Clonidine, Clonidine derivatives, Codeine, Meperidine,  Meperidine hcl, Metformin, Motrin [ibuprofen], Other, Penicillins, Demerol, Heparin, and Metformin and related   Social History   Tobacco Use  . Smoking status: Former Smoker    Quit date: 01/06/1956    Years since quitting: 62.6  . Smokeless tobacco: Never Used  Substance Use Topics  . Alcohol use: No    Alcohol/week: 0.0 standard drinks  . Drug use: No     Family Hx: The patient's family history includes Coronary artery disease in his father; Diabetes in his mother.  ROS:   Please see the history of present illness.    ROS All other systems reviewed and are negative.   EKGs/Labs/Other Test Reviewed:    EKG:  EKG is not ordered today.  The ekg ordered today demonstrates n/a  Recent Labs: No results found for requested labs within last 8760 hours.   Recent Lipid Panel No results found for: CHOL, TRIG, HDL, CHOLHDL, LDLCALC, LDLDIRECT  Physical Exam:    VS:  BP (!) 110/42   Pulse 60   Ht _0  (1.702 m)   Wt 162 lb (73.5 kg)   SpO2 94%   BMI 25.37 kg/m     Wt Readings from Last 3 Encounters:  08/09/18 162 lb (73.5 kg)  07/20/18 161 lb 12.8 oz (73.4 kg)  06/08/18 160 lb (72.6 kg)     Physical Exam  Constitutional: He is oriented to person, place, and time. He  appears well-developed and well-nourished. No distress.  HENT:  Head: Normocephalic and atraumatic.  Eyes: No scleral icterus.  Neck: No JVD (at 90 degrees) present. No thyromegaly present.  Cardiovascular: Normal rate and regular rhythm.  Murmur heard.  Systolic murmur is present with a grade of 2/6 at the upper right sternal border and upper left sternal border. Pulmonary/Chest: Effort normal and breath sounds normal. He has no rales.  Abdominal: Soft. There is no hepatomegaly.  Musculoskeletal:        General: Edema (1-2+ tight edema up to his mid thigh bilat) present.     Comments: No presacral edema noted  Lymphadenopathy:    He has no cervical adenopathy.  Neurological: He is alert and oriented to person, place, and time.  Skin: Skin is warm and dry.  Psychiatric: He has a normal mood and affect.    ASSESSMENT & PLAN:    1. Acute on chronic combined systolic and diastolic CHF (congestive heart failure) (HCC) EF 25-30 by echocardiogram 7/18.  He has severe diastolic dysfunction.  He currently displays signs of volume overload.  He really has more right-sided heart failure symptoms than the left.  His lungs are clear.  He is sitting at 90 degrees but does not have JVD in this position.  He does have chronic kidney disease.  He seems to have significant peripheral arterial disease.  His edema may be multifactorial.  However, I will adjust his diuretics further.  -Increase torsemide to 40 mg twice daily  -I have instructed his daughter to continue this for at least 1 week  -If he is having marginal improvement in the swelling after 1 week, he can continue 40 mg twice daily  -Obtain BMET, CBC, TSH today  -Arrange repeat BMET while he is in the hospital for his arteriogram this weekend  -Repeat Echocardiogram   2. Unstageable pressure ulcer of left heel ([place]) As noted, he has an arteriogram planned with Dr. Oneida Alar on Friday of this week.  3. Coronary artery disease involving native  coronary artery of native heart without  angina pectoris History of CABG in 1996 and drug-eluting stent to the vein graft to the obtuse marginal in 2015.  He is not having anginal symptoms.  Continue beta-blocker, statin, nitrates.  4. Chronic kidney disease, unspecified CKD stage Obtain BMET today and repeat BMET in several days after increasing diuretic.  5. Essential hypertension The patient's blood pressure is controlled on his current regimen.  Continue current therapy.   6. Cardiac pacemaker in situ Pacemaker was interrogated today.  He is pacer dependent.  His A. fib burden is <0.1%.  No high heart rate episodes noted.  7. History of GI bleed Obtain follow-up CBC today.  8.  Paroxysmal atrial fibrillation Maintaining sinus rhythm upon interrogation of pacemaker today.  He is not a candidate for anticoagulation given prior history of falls as well as GI bleeding.  Continue amiodarone.  Obtain follow-up LFTs, TSH.    Dispo:  Return in about 2 weeks (around 08/23/2018) for Close Follow Up w/ Dr. Asa Lente, or PA/NP on his team, or Richardson Dopp, PA-C.   Medication Adjustments/Labs and Tests Ordered: Current medicines are reviewed at length with the patient today.  Concerns regarding medicines are outlined above.  Tests Ordered: Orders Placed This Encounter  Procedures  . Basic Metabolic Panel (BMET)  . CBC w/Diff  . TSH  . Hepatic function panel  . EKG 12-Lead   Medication Changes: Meds ordered this encounter  Medications  . torsemide (DEMADEX) 20 MG tablet    Sig: Take 2 tablets (40 mg total) by mouth 2 (two) times daily. For one week.  If swelling decreases pt to go back one tablet (40 mg ) daily. If still edema stay on (40 mg) twice daily till pt come in for ov.    Dispense:  180 tablet    Refill:  1    Signed, Richardson Dopp, PA-C  08/09/2018 4:58 PM    [facility] [place], [place], [place]  [address] Phone: [phone]; Fax: [phone]

## 2018-08-09 NOTE — Telephone Encounter (Signed)
Left message for pt's dtr, [name] to call with a good time for Dr. March Rummage to call to talk about her ftr.

## 2018-08-09 NOTE — Progress Notes (Signed)
Pacemaker check in clinic. Normal device function. Thresholds, sensing, impedances consistent with previous measurements. Device programmed to maximize longevity. 8 mode switch, no available EGMs. AT/AF burden <0.1%. No high ventricular rates noted. Device programmed at appropriate safety margins. Histogram distribution appropriate for patient activity level. Estimated longevity 7.5 years. Home monitor has been unplugged since pt moved, family member will plug it back in when the pt returns home. Patient education completed.

## 2018-08-09 NOTE — Telephone Encounter (Signed)
Called to discuss. No answer. VM left. Advised to call back and we can arrange for time to discuss her father's condition.

## 2018-08-09 NOTE — Patient Instructions (Signed)
Medication Instructions:  Your physician has recommended you make the following change in your medication:  1. Increase torsemide two tablets (40 mg) twice daily for one week.  If swelling decreases go back to two tablets (40 mg) daily.  If swelling continues call office @ [phone], send Richardson Dopp ,[place]  A message and pt will continue to take two tablets ( 40 mg ) twice a day till next appointment.    Labwork: Your physician recommends that you have lab work today: bmet/cbc/tsh/lft   Testing/Procedures: Your physician has requested that you have an echocardiogram. Echocardiography is a painless test that uses sound waves to create images of your heart. It provides your doctor with information about the size and shape of your heart and how well your heart's chambers and valves are working. This procedure takes approximately one hour. There are no restrictions for this procedure.    Follow-Up: Your physician recommends that you keep your scheduled  follow-up appointment with Richardson Dopp, PA  On August 17 @ 2:15 pm. Your physician recommends that you keep your scheduled  follow-up appointment with Dr. Rayann Heman on August 27 @ 1: pm.    Any Other Special Instructions Will Be Listed Below (If Applicable).     If you need a refill on your cardiac medications before your next appointment, please call your pharmacy.

## 2018-08-10 ENCOUNTER — Other Ambulatory Visit (HOSPITAL_COMMUNITY)
Admission: RE | Admit: 2018-08-10 | Discharge: 2018-08-10 | Disposition: A | Discharge status: Home / Self Care | Payer: HMO | Source: Ambulatory Visit | Attending: Vascular Surgery | Admitting: Vascular Surgery

## 2018-08-10 ENCOUNTER — Other Ambulatory Visit: Payer: Self-pay | Admitting: *Deleted

## 2018-08-10 DIAGNOSIS — Z20828 Contact with and (suspected) exposure to other viral communicable diseases: Secondary | ICD-10-CM | POA: Diagnosis not present

## 2018-08-10 DIAGNOSIS — Z01812 Encounter for preprocedural laboratory examination: Secondary | ICD-10-CM | POA: Diagnosis not present

## 2018-08-10 LAB — BASIC METABOLIC PANEL
BUN/Creatinine Ratio: 13 (ref 10–24)
BUN: 24 mg/dL (ref 10–36)
CO2: 24 mmol/L (ref 20–29)
Calcium: 8.7 mg/dL (ref 8.6–10.2)
Chloride: 104 mmol/L (ref 96–106)
Creatinine, Ser: 1.8 mg/dL — ABNORMAL HIGH (ref 0.76–1.27)
GFR calc Af Amer: 37 mL/min/{1.73_m2} — ABNORMAL LOW (ref >59)
GFR calc non Af Amer: 32 mL/min/{1.73_m2} — ABNORMAL LOW (ref >59)
Glucose: 95 mg/dL (ref 65–99)
Potassium: 5.2 mmol/L (ref 3.5–5.2)
Sodium: 144 mmol/L (ref 134–144)

## 2018-08-10 LAB — CBC WITH DIFFERENTIAL/PLATELET
Basophils Absolute: 0 10*3/uL (ref 0.0–0.2)
Basos: 1 %
EOS (ABSOLUTE): 0.2 10*3/uL (ref 0.0–0.4)
Eos: 2 %
Hematocrit: 36.9 % — ABNORMAL LOW (ref 37.5–51.0)
Hemoglobin: 12 g/dL — ABNORMAL LOW (ref 13.0–17.7)
Immature Grans (Abs): 0 10*3/uL (ref 0.0–0.1)
Immature Granulocytes: 0 %
Lymphocytes Absolute: 0.9 10*3/uL (ref 0.7–3.1)
Lymphs: 14 %
MCH: 31 pg (ref 26.6–33.0)
MCHC: 32.5 g/dL (ref 31.5–35.7)
MCV: 95 fL (ref 79–97)
Monocytes Absolute: 0.6 10*3/uL (ref 0.1–0.9)
Monocytes: 10 %
Neutrophils Absolute: 4.5 10*3/uL (ref 1.4–7.0)
Neutrophils: 73 %
Platelets: 158 10*3/uL (ref 150–450)
RBC: 3.87 x10E6/uL — ABNORMAL LOW (ref 4.14–5.80)
RDW: 14 % (ref 11.6–15.4)
WBC: 6.2 10*3/uL (ref 3.4–10.8)

## 2018-08-10 LAB — HEPATIC FUNCTION PANEL
ALT: 6 IU/L (ref 0–44)
AST: 10 IU/L (ref 0–40)
Albumin: 4.1 g/dL (ref 3.5–4.6)
Alkaline Phosphatase: 47 IU/L (ref 39–117)
Bilirubin Total: 0.3 mg/dL (ref 0.0–1.2)
Bilirubin, Direct: 0.1 mg/dL (ref 0.00–0.40)
Total Protein: 5.6 g/dL — ABNORMAL LOW (ref 6.0–8.5)

## 2018-08-10 LAB — SARS CORONAVIRUS 2 (TAT 6-24 HRS): SARS Coronavirus 2: NEGATIVE

## 2018-08-10 LAB — TSH: TSH: 0.421 u[IU]/mL — ABNORMAL LOW (ref 0.450–4.500)

## 2018-08-11 ENCOUNTER — Encounter (HOSPITAL_BASED_OUTPATIENT_CLINIC_OR_DEPARTMENT_OTHER): Payer: HMO | Attending: Internal Medicine

## 2018-08-11 DIAGNOSIS — E11621 Type 2 diabetes mellitus with foot ulcer: Secondary | ICD-10-CM | POA: Diagnosis not present

## 2018-08-11 DIAGNOSIS — E1151 Type 2 diabetes mellitus with diabetic peripheral angiopathy without gangrene: Secondary | ICD-10-CM | POA: Insufficient documentation

## 2018-08-11 DIAGNOSIS — L97422 Non-pressure chronic ulcer of left heel and midfoot with fat layer exposed: Secondary | ICD-10-CM | POA: Diagnosis not present

## 2018-08-11 DIAGNOSIS — E1142 Type 2 diabetes mellitus with diabetic polyneuropathy: Secondary | ICD-10-CM | POA: Diagnosis not present

## 2018-08-11 DIAGNOSIS — L97819 Non-pressure chronic ulcer of other part of right lower leg with unspecified severity: Secondary | ICD-10-CM | POA: Insufficient documentation

## 2018-08-11 DIAGNOSIS — R609 Edema, unspecified: Secondary | ICD-10-CM | POA: Insufficient documentation

## 2018-08-11 DIAGNOSIS — L988 Other specified disorders of the skin and subcutaneous tissue: Secondary | ICD-10-CM | POA: Insufficient documentation

## 2018-08-11 DIAGNOSIS — L89620 Pressure ulcer of left heel, unstageable: Secondary | ICD-10-CM | POA: Diagnosis not present

## 2018-08-12 ENCOUNTER — Other Ambulatory Visit: Payer: Self-pay

## 2018-08-12 ENCOUNTER — Telehealth: Payer: Self-pay | Admitting: Nurse Practitioner

## 2018-08-12 ENCOUNTER — Other Ambulatory Visit (HOSPITAL_COMMUNITY): Payer: HMO

## 2018-08-12 ENCOUNTER — Ambulatory Visit (HOSPITAL_COMMUNITY)
Admission: RE | Admit: 2018-08-12 | Discharge: 2018-08-12 | Disposition: A | Discharge status: Home / Self Care | Payer: HMO | Attending: Vascular Surgery | Admitting: Vascular Surgery

## 2018-08-12 ENCOUNTER — Encounter (HOSPITAL_COMMUNITY)
Admission: RE | Disposition: A | Discharge status: Home / Self Care | Payer: Self-pay | Source: Home / Self Care | Attending: Vascular Surgery

## 2018-08-12 DIAGNOSIS — Z539 Procedure and treatment not carried out, unspecified reason: Secondary | ICD-10-CM | POA: Diagnosis not present

## 2018-08-12 DIAGNOSIS — E11621 Type 2 diabetes mellitus with foot ulcer: Secondary | ICD-10-CM | POA: Diagnosis not present

## 2018-08-12 DIAGNOSIS — L97429 Non-pressure chronic ulcer of left heel and midfoot with unspecified severity: Secondary | ICD-10-CM | POA: Insufficient documentation

## 2018-08-12 DIAGNOSIS — L89620 Pressure ulcer of left heel, unstageable: Secondary | ICD-10-CM | POA: Diagnosis not present

## 2018-08-12 LAB — GLUCOSE, CAPILLARY
Glucose-Capillary: 109 mg/dL — ABNORMAL HIGH (ref 70–99)
Glucose-Capillary: 59 mg/dL — ABNORMAL LOW (ref 70–99)

## 2018-08-12 LAB — POCT I-STAT, CHEM 8
BUN: 49 mg/dL — ABNORMAL HIGH (ref 8–23)
Calcium, Ion: 1.08 mmol/L — ABNORMAL LOW (ref 1.15–1.40)
Chloride: 107 mmol/L (ref 98–111)
Creatinine, Ser: 2 mg/dL — ABNORMAL HIGH (ref 0.61–1.24)
Glucose, Bld: 80 mg/dL (ref 70–99)
HCT: 40 % (ref 39.0–52.0)
Hemoglobin: 13.6 g/dL (ref 13.0–17.0)
Potassium: 5.9 mmol/L — ABNORMAL HIGH (ref 3.5–5.1)
Sodium: 143 mmol/L (ref 135–145)
TCO2: 28 mmol/L (ref 22–32)

## 2018-08-12 SURGERY — ABDOMINAL AORTOGRAM W/LOWER EXTREMITY
Anesthesia: LOCAL | Laterality: Bilateral

## 2018-08-12 MED ORDER — DEXTROSE 50 % IV SOLN
INTRAVENOUS | Status: AC
  Filled 2018-08-12: qty 50

## 2018-08-12 MED ORDER — SODIUM CHLORIDE 0.9 % IV SOLN
INTRAVENOUS | Status: DC
  Administered 2018-08-12: 07:00:00 via INTRAVENOUS

## 2018-08-12 MED ORDER — TORSEMIDE 20 MG PO TABS: 40.0000 mg | ORAL_TABLET | Freq: Every day | ORAL | 3 refills | Status: DC

## 2018-08-12 MED ORDER — DEXTROSE 50 % IV SOLN
12.5000 g | Freq: Once | INTRAVENOUS | Status: AC
  Administered 2018-08-12: 12.5 g via INTRAVENOUS

## 2018-08-12 NOTE — Progress Notes (Addendum)
CBG 59. D50 1/2 amp given per protocol. Istat run prior to D50 and showed glucose of 80. Pt alert and oriented. Did not take his Januvia this morning.  Abnormal labs reported to nurse in cath lab holding.

## 2018-08-12 NOTE — Progress Notes (Signed)
Spoke with pt and daughter after procedure canceled with understanding.  Daughter will follow up on wound care as needed.  (Pt has foam cup and gauze wrap but is missing a compression wrap as was contaminated in cath lab.)

## 2018-08-12 NOTE — Telephone Encounter (Signed)
Called patient in regards to message from [name]: Patient had peripheral angio cancelled today due to elevated Cr. Dr Oneida Alar is bringing in next Thursday to try and do have him hold demedex Wendsday am and Thursday am. Confirm he is currently only taking 40 mg daily   Patient was advised by Richardson Dopp, PA on 8/4 to increase torsemide to 40 mg twice daily x 1 week and then decrease to 40 mg daily if swelling decreases. Patient is not sure how much torsemide he is taking. He states his daughter prepares his medications for him. I asked him to get his pill bottle and his weekly medication container and identify the number of torsemide pills in his daily med box. He states he is taking 2 20 mg torsemide pills daily. I advised him to continue this dose and hold the medication on Wednesday Aug. 12 and Thursday Aug. 13 and resume after his procedure on Friday Aug. 14. Patient verbalized understanding and agreement and thanked me for the call.

## 2018-08-12 NOTE — Progress Notes (Signed)
Pt with elevated creatinine today of 2.0.  Left foot ulcer is about 3 cm diameter on left heel no evidence of infection.  Discussed renal function with pt and his daughter.  Discussed risk of contrast nephropathy.  Pt and daughter want to be aggressive in approach to salvaging the left leg.  In light of this we will cancel the procedure and reschedule for next week with admission to hospital prior to have renal and cardiology maximize cardiac and renal function with plans to do agram next Friday regardless of renal function on that day.  Our office will contact pt with plan later today after I have discussed with Dr Johnsie Cancel.  Ruta Hinds, MD [facility] Office: [phone] Pager: [phone]

## 2018-08-16 ENCOUNTER — Other Ambulatory Visit: Payer: Self-pay | Admitting: Pharmacist

## 2018-08-16 DIAGNOSIS — E039 Hypothyroidism, unspecified: Secondary | ICD-10-CM | POA: Diagnosis not present

## 2018-08-16 DIAGNOSIS — L97529 Non-pressure chronic ulcer of other part of left foot with unspecified severity: Secondary | ICD-10-CM | POA: Diagnosis not present

## 2018-08-16 DIAGNOSIS — G63 Polyneuropathy in diseases classified elsewhere: Secondary | ICD-10-CM | POA: Diagnosis not present

## 2018-08-16 DIAGNOSIS — M79672 Pain in left foot: Secondary | ICD-10-CM | POA: Diagnosis not present

## 2018-08-16 DIAGNOSIS — M79671 Pain in right foot: Secondary | ICD-10-CM | POA: Diagnosis not present

## 2018-08-16 DIAGNOSIS — I739 Peripheral vascular disease, unspecified: Secondary | ICD-10-CM | POA: Diagnosis not present

## 2018-08-16 MED ORDER — TORSEMIDE 20 MG PO TABS: 40.0000 mg | ORAL_TABLET | Freq: Every day | ORAL | 3 refills | Status: DC

## 2018-08-16 NOTE — Addendum Note (Signed)
Addended by: Emmaline Life on: 08/16/2018 04:22 PM   Modules accepted: Orders

## 2018-08-16 NOTE — Telephone Encounter (Signed)
Spoke with patient's daughter, Tommi Rumps, who states she is uncertain how much torsemide the patient has been taking because he has been taking medications on incorrect days. She is concerned that vascular procedure will be rescheduled again as was done previously because of patient's elevated creatinine.  She was not aware that I called the patient on Friday to advise him to decrease torsemide to 40 mg daily. She states that from the appearance of the pill box presently she thinks he only took torsemide 40 mg today but she is uncertain and does not know how much he has taken over the past few days Saw PCP today, no lab work done  I advised daughter that I will ask [name] to advise whether patient should hold torsemide tomorrow and Thursday morning prior to admission to [facility] on Thursday and will call her back with his advice.  Advised patient's daughter, per [name], that patient should hold torsemide until he is admitted. She verbalized understanding and agreement and was thankful for the help. While on the phone I also verified with device clinic that patient's remote machine is functioning properly per her request and verified appointment with Dr. Rayann Heman for her. She thanked me for my help.

## 2018-08-16 NOTE — Telephone Encounter (Signed)
Patient's daughter is calling about the instructions that were given to her father about the medication.  She is preparing his medication box.  She states the swelling has gone done some, but she can't really tell how much because on how the wound care people wrap his legs. She wants to know if he should continue with the higher dose of torsemide.  She states the PCP said that is what is cause his kidney issues.

## 2018-08-16 NOTE — Telephone Encounter (Signed)
Left message for patient's daughter, Tommi Rumps, to call back regarding medication instructions given to her father last week

## 2018-08-16 NOTE — Patient Outreach (Signed)
[facility]) Care Management  08/16/2018  Evan [name] May 27, 1923 [id number]   Patient was called to follow up on medication assistance. HIPAA identifiers were obtained. Spoke with patient and his daughter Evan [name].    Patient and his daughter confirmed receipt of 90 day supplies of both Entresto and Januvia.  Evan [name] expressed some concern about the Entresto and Januvia not being in her Father's Pill Pack. She was encouraged to call [facility] and ask them if they will allow her to bring over her father's medications from the prescription assistance programs and put those in his pill packs with the rest of his medications.  Evan [name] said she would call [facility]. She was also educated about how to order refills of [name] and Januvia from the patient assistance programs.  Patient went to have a LE arteriogram last week but was unable to have it done due to his Scr. His Torsemide has been held for the rest of the week and he is scheduled to have the arteriogram redone this Friday.  Plan: Follow up with patient and his daughter next week to see if any help is needed to get his medications from the programs into the pill packs.   [name], PharmD, [place] Clinical Pharmacist [phone]

## 2018-08-17 DIAGNOSIS — E11621 Type 2 diabetes mellitus with foot ulcer: Secondary | ICD-10-CM | POA: Diagnosis not present

## 2018-08-18 ENCOUNTER — Encounter (HOSPITAL_COMMUNITY): Payer: Self-pay | Admitting: General Practice

## 2018-08-18 ENCOUNTER — Inpatient Hospital Stay (HOSPITAL_COMMUNITY)
Admission: AD | Admit: 2018-08-18 | Discharge: 2018-09-02 | DRG: 240 | Disposition: A | Discharge status: Skilled Nursing Facility | Payer: HMO | Source: Ambulatory Visit | Attending: Vascular Surgery | Admitting: Vascular Surgery

## 2018-08-18 ENCOUNTER — Other Ambulatory Visit: Payer: Self-pay

## 2018-08-18 DIAGNOSIS — K219 Gastro-esophageal reflux disease without esophagitis: Secondary | ICD-10-CM | POA: Diagnosis present

## 2018-08-18 DIAGNOSIS — Z833 Family history of diabetes mellitus: Secondary | ICD-10-CM | POA: Diagnosis not present

## 2018-08-18 DIAGNOSIS — Z8673 Personal history of transient ischemic attack (TIA), and cerebral infarction without residual deficits: Secondary | ICD-10-CM

## 2018-08-18 DIAGNOSIS — Z7902 Long term (current) use of antithrombotics/antiplatelets: Secondary | ICD-10-CM | POA: Diagnosis not present

## 2018-08-18 DIAGNOSIS — Z20828 Contact with and (suspected) exposure to other viral communicable diseases: Secondary | ICD-10-CM | POA: Diagnosis present

## 2018-08-18 DIAGNOSIS — I5043 Acute on chronic combined systolic (congestive) and diastolic (congestive) heart failure: Secondary | ICD-10-CM | POA: Diagnosis not present

## 2018-08-18 DIAGNOSIS — Z23 Encounter for immunization: Secondary | ICD-10-CM | POA: Diagnosis not present

## 2018-08-18 DIAGNOSIS — I509 Heart failure, unspecified: Secondary | ICD-10-CM | POA: Diagnosis not present

## 2018-08-18 DIAGNOSIS — I70244 Atherosclerosis of native arteries of left leg with ulceration of heel and midfoot: Secondary | ICD-10-CM | POA: Diagnosis not present

## 2018-08-18 DIAGNOSIS — N390 Urinary tract infection, site not specified: Secondary | ICD-10-CM | POA: Diagnosis present

## 2018-08-18 DIAGNOSIS — Z951 Presence of aortocoronary bypass graft: Secondary | ICD-10-CM | POA: Diagnosis not present

## 2018-08-18 DIAGNOSIS — L97429 Non-pressure chronic ulcer of left heel and midfoot with unspecified severity: Secondary | ICD-10-CM | POA: Diagnosis not present

## 2018-08-18 DIAGNOSIS — I70262 Atherosclerosis of native arteries of extremities with gangrene, left leg: Secondary | ICD-10-CM | POA: Diagnosis not present

## 2018-08-18 DIAGNOSIS — E7800 Pure hypercholesterolemia, unspecified: Secondary | ICD-10-CM | POA: Diagnosis present

## 2018-08-18 DIAGNOSIS — Z6825 Body mass index (BMI) 25.0-25.9, adult: Secondary | ICD-10-CM

## 2018-08-18 DIAGNOSIS — D649 Anemia, unspecified: Secondary | ICD-10-CM | POA: Diagnosis present

## 2018-08-18 DIAGNOSIS — I7092 Chronic total occlusion of artery of the extremities: Secondary | ICD-10-CM | POA: Diagnosis not present

## 2018-08-18 DIAGNOSIS — Z8249 Family history of ischemic heart disease and other diseases of the circulatory system: Secondary | ICD-10-CM

## 2018-08-18 DIAGNOSIS — L89629 Pressure ulcer of left heel, unspecified stage: Secondary | ICD-10-CM | POA: Diagnosis present

## 2018-08-18 DIAGNOSIS — R338 Other retention of urine: Secondary | ICD-10-CM | POA: Diagnosis not present

## 2018-08-18 DIAGNOSIS — Z0001 Encounter for general adult medical examination with abnormal findings: Secondary | ICD-10-CM | POA: Diagnosis not present

## 2018-08-18 DIAGNOSIS — E440 Moderate protein-calorie malnutrition: Secondary | ICD-10-CM | POA: Diagnosis present

## 2018-08-18 DIAGNOSIS — I2510 Atherosclerotic heart disease of native coronary artery without angina pectoris: Secondary | ICD-10-CM | POA: Diagnosis present

## 2018-08-18 DIAGNOSIS — N289 Disorder of kidney and ureter, unspecified: Secondary | ICD-10-CM | POA: Diagnosis present

## 2018-08-18 DIAGNOSIS — Z87891 Personal history of nicotine dependence: Secondary | ICD-10-CM | POA: Diagnosis not present

## 2018-08-18 DIAGNOSIS — N183 Chronic kidney disease, stage 3 (moderate): Secondary | ICD-10-CM | POA: Diagnosis not present

## 2018-08-18 DIAGNOSIS — I480 Paroxysmal atrial fibrillation: Secondary | ICD-10-CM | POA: Diagnosis present

## 2018-08-18 DIAGNOSIS — E039 Hypothyroidism, unspecified: Secondary | ICD-10-CM | POA: Diagnosis present

## 2018-08-18 DIAGNOSIS — N401 Enlarged prostate with lower urinary tract symptoms: Secondary | ICD-10-CM | POA: Diagnosis not present

## 2018-08-18 DIAGNOSIS — E1152 Type 2 diabetes mellitus with diabetic peripheral angiopathy with gangrene: Principal | ICD-10-CM | POA: Diagnosis present

## 2018-08-18 DIAGNOSIS — I130 Hypertensive heart and chronic kidney disease with heart failure and stage 1 through stage 4 chronic kidney disease, or unspecified chronic kidney disease: Secondary | ICD-10-CM | POA: Diagnosis not present

## 2018-08-18 DIAGNOSIS — E1151 Type 2 diabetes mellitus with diabetic peripheral angiopathy without gangrene: Secondary | ICD-10-CM | POA: Diagnosis not present

## 2018-08-18 DIAGNOSIS — I739 Peripheral vascular disease, unspecified: Secondary | ICD-10-CM | POA: Diagnosis present

## 2018-08-18 LAB — CBC
HCT: 36.9 % — ABNORMAL LOW (ref 39.0–52.0)
Hemoglobin: 11.9 g/dL — ABNORMAL LOW (ref 13.0–17.0)
MCH: 31.8 pg (ref 26.0–34.0)
MCHC: 32.2 g/dL (ref 30.0–36.0)
MCV: 98.7 fL (ref 80.0–100.0)
Platelets: 182 10*3/uL (ref 150–400)
RBC: 3.74 MIL/uL — ABNORMAL LOW (ref 4.22–5.81)
RDW: 14.1 % (ref 11.5–15.5)
WBC: 6.1 10*3/uL (ref 4.0–10.5)
nRBC: 0 % (ref 0.0–0.2)

## 2018-08-18 LAB — BASIC METABOLIC PANEL
Anion gap: 11 (ref 5–15)
BUN: 37 mg/dL — ABNORMAL HIGH (ref 8–23)
CO2: 26 mmol/L (ref 22–32)
Calcium: 9.1 mg/dL (ref 8.9–10.3)
Chloride: 105 mmol/L (ref 98–111)
Creatinine, Ser: 1.71 mg/dL — ABNORMAL HIGH (ref 0.61–1.24)
GFR calc Af Amer: 39 mL/min — ABNORMAL LOW (ref >60)
GFR calc non Af Amer: 34 mL/min — ABNORMAL LOW (ref >60)
Glucose, Bld: 119 mg/dL — ABNORMAL HIGH (ref 70–99)
Potassium: 5 mmol/L (ref 3.5–5.1)
Sodium: 142 mmol/L (ref 135–145)

## 2018-08-18 LAB — SARS CORONAVIRUS 2 BY RT PCR (HOSPITAL ORDER, PERFORMED IN ~~LOC~~ HOSPITAL LAB): SARS Coronavirus 2: NEGATIVE

## 2018-08-18 MED ORDER — TRAMADOL HCL 50 MG PO TABS
25.0000 mg | ORAL_TABLET | Freq: Four times a day (QID) | ORAL | Status: DC | PRN
  Administered 2018-08-19 – 2018-08-22 (×3): 25 mg via ORAL
  Filled 2018-08-18 (×3): qty 1

## 2018-08-18 MED ORDER — LOPERAMIDE HCL 2 MG PO CAPS
2.0000 mg | ORAL_CAPSULE | Freq: Four times a day (QID) | ORAL | Status: DC | PRN
  Administered 2018-08-29: 2 mg via ORAL
  Administered 2018-09-02: 4 mg via ORAL
  Filled 2018-08-18: qty 1
  Filled 2018-08-18: qty 2

## 2018-08-18 MED ORDER — ISOSORBIDE MONONITRATE ER 30 MG PO TB24
30.0000 mg | ORAL_TABLET | Freq: Every day | ORAL | Status: DC
  Administered 2018-08-20 – 2018-09-02 (×14): 30 mg via ORAL
  Filled 2018-08-18 (×14): qty 1

## 2018-08-18 MED ORDER — PNEUMOCOCCAL 13-VAL CONJ VACC IM SUSP
0.5000 mL | INTRAMUSCULAR | Status: AC
  Administered 2018-08-31: 0.5 mL via INTRAMUSCULAR
  Filled 2018-08-18 (×2): qty 0.5

## 2018-08-18 MED ORDER — GABAPENTIN 100 MG PO CAPS
100.0000 mg | ORAL_CAPSULE | Freq: Three times a day (TID) | ORAL | Status: DC
  Administered 2018-08-18 – 2018-08-24 (×18): 100 mg via ORAL
  Filled 2018-08-18 (×18): qty 1

## 2018-08-18 MED ORDER — ACETAMINOPHEN 325 MG PO TABS
650.0000 mg | ORAL_TABLET | Freq: Four times a day (QID) | ORAL | Status: DC | PRN
  Administered 2018-08-19: 650 mg via ORAL

## 2018-08-18 MED ORDER — SACCHAROMYCES BOULARDII 250 MG PO CAPS
250.0000 mg | ORAL_CAPSULE | Freq: Two times a day (BID) | ORAL | Status: DC
  Administered 2018-08-18 – 2018-09-02 (×29): 250 mg via ORAL
  Filled 2018-08-18 (×29): qty 1

## 2018-08-18 MED ORDER — ATORVASTATIN CALCIUM 10 MG PO TABS
10.0000 mg | ORAL_TABLET | Freq: Every evening | ORAL | Status: DC
  Administered 2018-08-18 – 2018-09-01 (×15): 10 mg via ORAL
  Filled 2018-08-18 (×15): qty 1

## 2018-08-18 MED ORDER — CLOPIDOGREL BISULFATE 75 MG PO TABS
75.0000 mg | ORAL_TABLET | Freq: Every day | ORAL | Status: DC
  Administered 2018-08-19 – 2018-09-02 (×15): 75 mg via ORAL
  Filled 2018-08-18 (×15): qty 1

## 2018-08-18 MED ORDER — FERROUS SULFATE 325 (65 FE) MG PO TABS
325.0000 mg | ORAL_TABLET | Freq: Every day | ORAL | Status: DC
  Administered 2018-08-19 – 2018-09-02 (×14): 325 mg via ORAL
  Filled 2018-08-18 (×14): qty 1

## 2018-08-18 MED ORDER — MELATONIN 3 MG PO TABS
9.0000 mg | ORAL_TABLET | Freq: Every evening | ORAL | Status: DC | PRN
  Administered 2018-08-29: 9 mg via ORAL
  Filled 2018-08-18 (×4): qty 3

## 2018-08-18 MED ORDER — CARVEDILOL 12.5 MG PO TABS
12.5000 mg | ORAL_TABLET | Freq: Two times a day (BID) | ORAL | Status: DC
  Administered 2018-08-18 – 2018-09-02 (×29): 12.5 mg via ORAL
  Filled 2018-08-18 (×29): qty 1

## 2018-08-18 MED ORDER — FINASTERIDE 5 MG PO TABS
5.0000 mg | ORAL_TABLET | Freq: Every day | ORAL | Status: DC
  Administered 2018-08-20 – 2018-09-02 (×13): 5 mg via ORAL
  Filled 2018-08-18 (×13): qty 1

## 2018-08-18 MED ORDER — NITROGLYCERIN 0.4 MG SL SUBL: 0.4000 mg | SUBLINGUAL_TABLET | SUBLINGUAL | Status: DC | PRN

## 2018-08-18 MED ORDER — DIVALPROEX SODIUM ER 250 MG PO TB24
250.0000 mg | ORAL_TABLET | Freq: Every day | ORAL | Status: DC
  Administered 2018-08-18 – 2018-09-01 (×15): 250 mg via ORAL
  Filled 2018-08-18 (×16): qty 1

## 2018-08-18 MED ORDER — LEVOTHYROXINE SODIUM 112 MCG PO TABS
112.0000 ug | ORAL_TABLET | Freq: Every day | ORAL | Status: DC
  Administered 2018-08-19 – 2018-09-02 (×15): 112 ug via ORAL
  Filled 2018-08-18 (×15): qty 1

## 2018-08-18 MED ORDER — SODIUM CHLORIDE 0.9 % IV SOLN
INTRAVENOUS | Status: AC
  Administered 2018-08-18: 14:00:00 via INTRAVENOUS

## 2018-08-18 MED ORDER — AMIODARONE HCL 200 MG PO TABS
100.0000 mg | ORAL_TABLET | Freq: Every day | ORAL | Status: DC
  Administered 2018-08-20 – 2018-09-02 (×14): 100 mg via ORAL
  Filled 2018-08-18 (×14): qty 1

## 2018-08-18 NOTE — H&P (Addendum)
HPI: Evan [name] is a 85 y.o. male, with a 67-month history of pain in his left foot as well as a left foot ulcer.  The patient does not know how the ulcer started.  He was initially seen at the wound center and sent for further evaluation after an arterial study showed poor perfusion of his left leg.  Patient has had bilateral lower extremity edema over the last few months as well.  He states the pain in his left foot is almost constant.  Most of his day consists of some walking around the house but otherwise in his recliner.  Other medical problems include atrial fibrillation, coronary artery disease, congestive heart failure, diabetes, elevated cholesterol, high blood pressure.  All of these are currently controlled.  Patient is on Plavix but not on systemic anticoagulation due to his age and risk of injury.  He is a former smoker but quit in 1958.  He previously had vein harvested from the right leg for coronary artery bypass grafting.  Most recent echo showed ejection fraction 25 to 30%.  Most recent serum creatinine was 1.48 on March 16, 2017.      Past Medical History:  Diagnosis Date  . AC joint dislocation   . Allergic rhinitis   . Arthritis   . Atrial tachycardia ([place])   . BPH (benign prostatic hyperplasia)   . CAD   . CHF (congestive heart failure) ([place])   . DM    type 2  . GERD (gastroesophageal reflux disease)   . H/O: GI bleed   . HYPERCHOLESTEROLEMIA   . HYPERTENSION   . Hypothyroidism   . Iron deficiency anemia   . NEPHROLITHIASIS, HX OF   . Paroxysmal atrial fibrillation (HCC)   . Rib fracture   . TRANSIENT ISCHEMIC ATTACKS, HX OF   . 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 Vertigo         Past Surgical History:  Procedure Laterality Date  . AV FISTULA REPAIR    . BACK SURGERY  2000'S  . CARDIAC CATHETERIZATION  12/11/2013   Procedure: CORONARY STENT INTERVENTION;  Surgeon: Burnell Blanks,  MD;  Location: [facility] CATH LAB;  Service: Cardiovascular;;  DES 3.5 x 15 Resolute to seq SVG of OM1/OM2   . CORONARY ARTERY BYPASS GRAFT  1996  . CORONARY STENT PLACEMENT  12/11/13   SVG-OM DES  . FEMORAL ARTERY ANEURYSM REPAIR     feroral pseudo-aneurysm repair  . INSERT / REPLACE / REMOVE PACEMAKER    . LEFT HEART CATHETERIZATION WITH CORONARY/GRAFT ANGIOGRAM N/A 12/11/2013   Procedure: LEFT HEART CATHETERIZATION WITH Beatrix Fetters;  Surgeon: Burnell Blanks, MD;  Location: [facility] CATH LAB;  Service: Cardiovascular;  Laterality: N/A;  . PERMANENT PACEMAKER INSERTION N/A 12/12/2013   MDT ADDRL1 pacemaker implnated by Dr Rayann Heman  . PTCA  1980's         Family History  Problem Relation Age of Onset  . Diabetes Mother   . Coronary artery disease Father     SOCIAL HISTORY: Social History        Socioeconomic History  . Marital status: Widowed    Spouse name: Not on file  . Number of children: Not on file  . Years of education: Not on file  . Highest education level: Not on file  Occupational History  . Not on file  Social Needs  . Financial resource strain: Not on file  .  Food insecurity    Worry: Not on file    Inability: Not on file  . Transportation needs    Medical: Not on file    Non-medical: Not on file  Tobacco Use  . Smoking status: Former Smoker    Quit date: 01/06/1956    Years since quitting: 62.5  . Smokeless tobacco: Never Used  Substance and Sexual Activity  . Alcohol use: No    Alcohol/week: 0.0 standard drinks  . Drug use: No  . Sexual activity: Not Currently  Lifestyle  . Physical activity    Days per week: Not on file    Minutes per session: Not on file  . Stress: Not on file  Relationships  . Social Herbalist on phone: Not on file    Gets together: Not on file    Attends religious service: Not on file    Active member of club or organization: Not on file    Attends meetings of  clubs or organizations: Not on file    Relationship status: Not on file  . Intimate partner violence    Fear of current or ex partner: Not on file    Emotionally abused: Not on file    Physically abused: Not on file    Forced sexual activity: Not on file  Other Topics Concern  . Not on file  Social History Narrative   Pt is married and lives with his wife.  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           Allergies  Allergen Reactions  . Clonidine   . Clonidine Derivatives     unknown reaction   . Codeine     unknown reaction   . Meperidine   . Meperidine Hcl     REACTION: Hypotension  . Metformin   . Motrin [Ibuprofen]     unknown reaction   . Other     Celery - unknown reaction  . Penicillins     unknown reaction   . Demerol Other (See Comments)    hypotension  . Heparin Other (See Comments)    hypotension  . Metformin And Related Diarrhea          Current Outpatient Medications  Medication Sig Dispense Refill  . acetaminophen (TYLENOL) 325 MG tablet Take 650 mg by mouth every 6 (six) hours as needed for mild pain.    Marland Kitchen amiodarone (PACERONE) 200 MG tablet TAKE 1/2 TABLET BY MOUTH EVERY DAY 45 tablet 2  . atorvastatin (LIPITOR) 10 MG tablet Take 1 tablet (10 mg total) by mouth daily. 30 tablet 0  . carvedilol (COREG) 12.5 MG tablet Take 1 tablet (12.5 mg total) by mouth 2 (two) times daily with a meal. 60 tablet 0  . clopidogrel (PLAVIX) 75 MG tablet TAKE 1 TABLET BY MOUTH EVERY DAY WITH BREAKFAST 90 tablet 2  . Cobalamine Combinations (VITAMIN B12-FOLIC ACID PO) Take 1 tablet by mouth every Monday, Wednesday, and Friday.    . divalproex (DEPAKOTE ER) 250 MG 24 hr tablet Take 1 tablet (250 mg total) by mouth at bedtime. 30 tablet 0  . ENTRESTO 24-26 MG TAKE 1 TABLET BY MOUTH 2 TIMES DAILY 60 tablet 9  . ferrous sulfate 324 (65 Fe) MG TBEC Take 1 tablet by mouth 2 (two)  times daily.    . finasteride (PROSCAR) 5 MG tablet Take 1 tablet (5 mg total) by mouth daily. [place]  tablet 0  . folic acid (FOLVITE) 778 MCG tablet Take 400 mcg by mouth 3 (three) times a week. M, Wed,Fri    . gabapentin (NEURONTIN) 100 MG capsule Take 100 mg by mouth 2 (two) times daily.    . isosorbide mononitrate (IMDUR) 30 MG 24 hr tablet TAKE 1 TABLET BY MOUTH EVERY DAY 90 tablet 1  . levothyroxine (SYNTHROID, LEVOTHROID) 112 MCG tablet Take 1 tablet (112 mcg total) by mouth daily before breakfast. 30 tablet 0  . Loperamide HCl (IMODIUM PO) Take by mouth as directed.    Marland Kitchen MELATONIN PO Take 10 mg by mouth at bedtime as needed.    . nitroGLYCERIN (NITROSTAT) 0.4 MG SL tablet PLACE 1 TABLET (0.4 MG TOTAL) UNDER THE TONGUE EVERY 5 (FIVE) MINUTES AS NEEDED FOR CHEST PAIN. 25 tablet 0  . saccharomyces boulardii (FLORASTOR) 250 MG capsule Take 1 capsule (250 mg total) by mouth 2 (two) times daily. 60 capsule 0  . sitaGLIPtin (JANUVIA) 25 MG tablet Take 1 tablet (25 mg total) by mouth daily. Take one tablet by mouth once daily to control blood sugar 30 tablet 0  . torsemide (DEMADEX) 20 MG tablet Take 2 tablets (40 mg total) by mouth daily. 180 tablet 1   No current facility-administered medications for this visit.     ROS:   General:  No weight loss, Fever, chills  HEENT: No recent headaches, no nasal bleeding, no visual changes, no sore throat  Neurologic: No dizziness, blackouts, seizures. No recent symptoms of stroke or mini- stroke. No recent episodes of slurred speech, or temporary blindness.  Cardiac: No recent episodes of chest pain/pressure, no shortness of breath at rest.  + shortness of breath with exertion.  Denies history of atrial fibrillation or irregular heartbeat  Vascular: No history of rest pain in feet.  No history of claudication.  No history of non-healing ulcer, No history of DVT   Pulmonary: No home oxygen, no productive cough, no hemoptysis,  No  asthma or wheezing  Musculoskeletal:  [ ]  Arthritis, [ ]  Low back pain,  [ ]  Joint pain  Hematologic:No history of hypercoagulable state.  No history of easy bleeding.  No history of anemia  Gastrointestinal: No hematochezia or melena,  No gastroesophageal reflux, no trouble swallowing, history of GI bleeding in the remote past Urinary: [ ]  chronic Kidney disease, [ ]  on HD - [ ]  MWF or [ ]  TTHS, [ ]  Burning with urination, [ ]  Frequent urination, [ ]  Difficulty urinating;   Skin: No rashes  Psychological: No history of anxiety,  No history of depression   Physical Examination     Vitals:   07/20/18 1305  BP: (!) 119/57  Pulse: 62  Resp: 16  Temp: 98.4 F (36.9 C)  TempSrc: Temporal  SpO2: 99%  Weight: 161 lb 12.8 oz (73.4 kg)  Height: 5\' 7"  (1.702 m)    Body mass index is 25.34 kg/m.  General:  Alert and oriented, no acute distress, frail appearing HEENT: Normal Neck: No JVD Cardiac: Regular Rate and Rhythm Abdomen: Soft, non-tender, non-distended, no mass, no scars, mild left lower quadrant abdominal pain Skin: No rash, ulceration with dark yellow eschar left heel 2 x 2 cm an additional ulcer of similar size adjacent to this Extremity Pulses:  2+ radial, brachial, femoral, absent popliteal dorsalis pedis, posterior tibial pulses bilaterally Musculoskeletal: No deformity diffuse pretibial and pedal edema both legs           Neurologic: Upper and lower  extremity motor 5/5 and symmetric  DATA:  Patient had bilateral ABIs performed.  Vessels were noncompressible bilaterally.  A limited duplex was performed which showed occlusion of the left popliteal artery.  ASSESSMENT: Peripheral arterial disease in a very frail 85 year old male now with a decubitus ulcer of his left heel at risk for limb loss.  I had a lengthy discussion with the patient and his daughter today regarding risk benefits possible complications and procedure details related arteriogram possible  intervention.  I discussed with him that we would see what we could do from a percutaneous standpoint as an intervention to improve perfusion to his left foot.  However, I do not believe that the patient is an open revascularization candidate due to his cardiac status his age and overall immobility.  If we do not have any percutaneous options we may need to discuss amputation for pain control.  We will discuss this more after his arteriogram.  Hopefully we can improve his overall perfusion for wound healing.  I also discussed with the patient and his daughter today that we need to offload the skin of the left heel and any pressure on this area is going to impede wound healing which is a notoriously bad area for wound healing in general.   PLAN: See above.  Arteriogram scheduled for August 05, 2018.   Ruta Hinds, MD [facility] [place] Office: [phone] Pager: [phone]  I spoke to Dr. Marlou Porch to help with the CHF and diuretics.  He advised NS @ 50 CC per Hr for 24 hours and restart all home medications post procedure.  Roxy Horseman PA-C

## 2018-08-18 NOTE — Progress Notes (Signed)
Patient ambulated in hallway with nursing staff and rollator. Patient tolerated well. Slow gait. Back in chair will monitor patient. Antwaine Boomhower, Bettina Gavia RN

## 2018-08-18 NOTE — Patient Outreach (Signed)
  [facility]) Care Management Chronic Special Needs Program    08/18/2018  Name: Evan [name], DOB: 1923-07-31  MRN: [id number]   Mr. Evan [name] is enrolled in a chronic special needs plan. Client admitted 08/18/2018 - arteriogram. Individualized care plan sent to utilization management.   Plan: Care Coordination as needed. Continue to follow.  Thea Silversmith, RN, MSN, [place] [phone]

## 2018-08-18 NOTE — Care Plan (Signed)
Patient arrived from h ome to [facility]. Patient placed on monitor and oriented to room. Vital signs obtained andCCMD made aware. Patient with B leg wrappings to legs. Special shoe to left foot. Patient with scab to right knee. Per patient daughter patient with blistering and ulcers under leg wrappings. Will monitor patient. Quatavious Rossa, Bettina Gavia rN

## 2018-08-19 ENCOUNTER — Ambulatory Visit (HOSPITAL_COMMUNITY): Admission: RE | Admit: 2018-08-19 | Payer: HMO | Source: Home / Self Care | Admitting: Vascular Surgery

## 2018-08-19 ENCOUNTER — Telehealth: Payer: Self-pay | Admitting: *Deleted

## 2018-08-19 ENCOUNTER — Encounter (HOSPITAL_COMMUNITY)
Admission: AD | Disposition: A | Discharge status: Skilled Nursing Facility | Payer: Self-pay | Source: Ambulatory Visit | Attending: Vascular Surgery

## 2018-08-19 DIAGNOSIS — I70244 Atherosclerosis of native arteries of left leg with ulceration of heel and midfoot: Secondary | ICD-10-CM

## 2018-08-19 DIAGNOSIS — I739 Peripheral vascular disease, unspecified: Secondary | ICD-10-CM | POA: Diagnosis present

## 2018-08-19 HISTORY — PX: ABDOMINAL AORTOGRAM W/LOWER EXTREMITY: CATH118223

## 2018-08-19 HISTORY — PX: PERIPHERAL VASCULAR BALLOON ANGIOPLASTY: CATH118281

## 2018-08-19 LAB — BASIC METABOLIC PANEL
Anion gap: 9 (ref 5–15)
BUN: 27 mg/dL — ABNORMAL HIGH (ref 8–23)
CO2: 24 mmol/L (ref 22–32)
Calcium: 8.5 mg/dL — ABNORMAL LOW (ref 8.9–10.3)
Chloride: 107 mmol/L (ref 98–111)
Creatinine, Ser: 1.39 mg/dL — ABNORMAL HIGH (ref 0.61–1.24)
GFR calc Af Amer: 51 mL/min — ABNORMAL LOW (ref >60)
GFR calc non Af Amer: 44 mL/min — ABNORMAL LOW (ref >60)
Glucose, Bld: 88 mg/dL (ref 70–99)
Potassium: 4.1 mmol/L (ref 3.5–5.1)
Sodium: 140 mmol/L (ref 135–145)

## 2018-08-19 LAB — POCT ACTIVATED CLOTTING TIME
Activated Clotting Time: 279 seconds
Activated Clotting Time: 186 seconds
Activated Clotting Time: 224 seconds
Activated Clotting Time: 175 seconds

## 2018-08-19 LAB — GLUCOSE, CAPILLARY: Glucose-Capillary: 88 mg/dL (ref 70–99)

## 2018-08-19 SURGERY — ABDOMINAL AORTOGRAM W/LOWER EXTREMITY
Anesthesia: LOCAL | Laterality: Left

## 2018-08-19 MED ORDER — HEPARIN (PORCINE) IN NACL 1000-0.9 UT/500ML-% IV SOLN
INTRAVENOUS | Status: AC
  Filled 2018-08-19: qty 1000

## 2018-08-19 MED ORDER — HYDRALAZINE HCL 20 MG/ML IJ SOLN
5.0000 mg | INTRAMUSCULAR | Status: DC | PRN
  Administered 2018-08-19: 5 mg via INTRAVENOUS

## 2018-08-19 MED ORDER — LIDOCAINE HCL (PF) 1 % IJ SOLN
INTRAMUSCULAR | Status: AC
  Filled 2018-08-19: qty 30

## 2018-08-19 MED ORDER — LABETALOL HCL 5 MG/ML IV SOLN
10.0000 mg | INTRAVENOUS | Status: DC | PRN
  Administered 2018-08-19: 10 mg via INTRAVENOUS

## 2018-08-19 MED ORDER — HEPARIN SODIUM (PORCINE) 1000 UNIT/ML IJ SOLN
INTRAMUSCULAR | Status: DC | PRN
  Administered 2018-08-19: 8000 [IU] via INTRAVENOUS

## 2018-08-19 MED ORDER — SODIUM CHLORIDE 0.9 % IV SOLN: 250.0000 mL | INTRAVENOUS | Status: DC | PRN

## 2018-08-19 MED ORDER — MORPHINE SULFATE (PF) 2 MG/ML IV SOLN: 2.0000 mg | INTRAVENOUS | Status: DC | PRN

## 2018-08-19 MED ORDER — ONDANSETRON HCL 4 MG/2ML IJ SOLN: 4.0000 mg | Freq: Four times a day (QID) | INTRAMUSCULAR | Status: DC | PRN

## 2018-08-19 MED ORDER — OXYCODONE HCL 5 MG PO TABS: 5.0000 mg | ORAL_TABLET | ORAL | Status: DC | PRN

## 2018-08-19 MED ORDER — LIDOCAINE HCL (PF) 1 % IJ SOLN
INTRAMUSCULAR | Status: DC | PRN
  Administered 2018-08-19: 15 mL via INTRADERMAL

## 2018-08-19 MED ORDER — ACETAMINOPHEN 325 MG PO TABS: 650.0000 mg | ORAL_TABLET | ORAL | Status: DC | PRN

## 2018-08-19 MED ORDER — MORPHINE SULFATE (PF) 10 MG/ML IV SOLN: 2.0000 mg | INTRAVENOUS | Status: DC | PRN

## 2018-08-19 MED ORDER — SODIUM CHLORIDE 0.9% FLUSH
3.0000 mL | INTRAVENOUS | Status: DC | PRN
  Administered 2018-09-01: 3 mL via INTRAVENOUS
  Filled 2018-08-19: qty 3

## 2018-08-19 MED ORDER — IODIXANOL 320 MG/ML IV SOLN
INTRAVENOUS | Status: DC | PRN
  Administered 2018-08-19: 11:00:00 90 mL via INTRA_ARTERIAL

## 2018-08-19 MED ORDER — HEPARIN SODIUM (PORCINE) 1000 UNIT/ML IJ SOLN
INTRAMUSCULAR | Status: AC
  Filled 2018-08-19: qty 1

## 2018-08-19 MED ORDER — SODIUM CHLORIDE 0.9% FLUSH
3.0000 mL | Freq: Two times a day (BID) | INTRAVENOUS | Status: DC
  Administered 2018-08-19 – 2018-08-23 (×6): 3 mL via INTRAVENOUS

## 2018-08-19 MED ORDER — HEPARIN (PORCINE) IN NACL 1000-0.9 UT/500ML-% IV SOLN
INTRAVENOUS | Status: DC | PRN
  Administered 2018-08-19 (×2): 500 mL

## 2018-08-19 MED ORDER — SODIUM CHLORIDE 0.9 % IV SOLN: INTRAVENOUS | Status: AC

## 2018-08-19 SURGICAL SUPPLY — 16 items
CATH ANGIO 5F BER2 65CM (CATHETERS) ×1 IMPLANT
CATH ANGIO 5F PIGTAIL 65CM (CATHETERS) ×1 IMPLANT
CATH CROSS OVER TEMPO 5F (CATHETERS) ×1 IMPLANT
CATH SOFT-VU 4F 65 STRAIGHT (CATHETERS) IMPLANT
CATH SOFT-VU STRAIGHT 4F 65CM (CATHETERS) ×2
CATH TEMPO AQUA 5F 100CM (CATHETERS) ×1 IMPLANT
GLIDEWIRE ADV .035X260CM (WIRE) ×1 IMPLANT
KIT PV (KITS) ×3 IMPLANT
SHEATH HIGHFLEX ANSEL 6FRX55 (SHEATH) ×1 IMPLANT
SHEATH PINNACLE 5F 10CM (SHEATH) ×1 IMPLANT
SHEATH PROBE COVER 6X72 (BAG) ×1 IMPLANT
SYR MEDRAD MARK V 150ML (SYRINGE) ×1 IMPLANT
TRANSDUCER W/STOPCOCK (MISCELLANEOUS) ×3 IMPLANT
TRAY PV CATH (CUSTOM PROCEDURE TRAY) ×3 IMPLANT
WIRE BENTSON .035X145CM (WIRE) ×1 IMPLANT
WIRE ROSEN-J .035X260CM (WIRE) ×1 IMPLANT

## 2018-08-19 NOTE — Consult Note (Signed)
   [facility] CM Inpatient Consult   08/19/2018  KAMARRI LOVVORN 1923/06/26 [id number]  Patient is currently active with [facility] for chronic disease management services with the [employer] CSNP with an RN Chronic Care Coordinator.  Our community based plan of care has focused on disease management and community resource support.    Patient will receive a post hospital call and will be evaluated for assessments and disease process education.    Plan: Inpatient [facility] team member to make aware that [facility] Management following.  Will follow for progress and disposition and update Chronic Care Coordinator of any post hospital needs.   Of note, [facility] Care Management services does not replace or interfere with any services that are needed or arranged by inpatient [facility] care management team.  For additional questions or referrals please contact:  Natividad Brood, RN BSN [facility] Liaison  [phone] business mobile phone Toll free office [phone]  Fax number: [phone] [email or web address]

## 2018-08-19 NOTE — TOC Initial Note (Addendum)
Transition of Care [facility]) - Initial/Assessment Note    Patient Details  Name: Evan [name] MRN: [id number] Date of Birth: [birth year]  Transition of Care [facility]) CM/SW Contact:    Vinie Sill, [place] Phone Number: 08/19/2018, 5:47 PM  Clinical Narrative:       Patient's daughter requested to see CSW.  Patient's daughter, [name], states she is the patient's HCPOA. Patient's daughter inquired about ST rehab options. CSW explained the SNF process and directed her to inquire with the MD regarding specific medical questions and the patient's plan for possible amputation.   CSW provided the patient's daughter with Medicare.gov listing for possible future needs. She expressed appreciation for the information and CSW coming to speak with her.  Thurmond Butts, MSW, [facility] Clinical Social Worker [phone]        Expected Discharge Plan: Home/Self Care     Patient Goals and CMS Choice        Expected Discharge Plan and Services Expected Discharge Plan: Home/Self Care       Living arrangements for the past 2 months: [place] Expected Discharge Date: 08/20/18                                    Prior Living Arrangements/Services Living arrangements for the past 2 months: [place] Lives with:: Self                   Activities of Daily Living Home Assistive Devices/Equipment: Environmental consultant (specify type), Eyeglasses, Hearing aid, Raised toilet seat with rails, Shower chair without back ADL Screening (condition at time of admission) Patient's cognitive ability adequate to safely complete daily activities?: No Is the patient deaf or have difficulty hearing?: Yes Does the patient have difficulty seeing, even when wearing glasses/contacts?: No Does the patient have difficulty concentrating, remembering, or making decisions?: No Patient able to express need for assistance with ADLs?: Yes Does the patient have difficulty dressing  or bathing?: No Independently performs ADLs?: Yes (appropriate for developmental age) Does the patient have difficulty walking or climbing stairs?: Yes Weakness of Legs: Both Weakness of Arms/Hands: None  Permission Sought/Granted                  Emotional Assessment Appearance:: Other (Comment Required(unable to assess) Attitude/Demeanor/Rapport: Unable to Assess Affect (typically observed): Unable to Assess Orientation: : Oriented to Self, Oriented to Place, Oriented to  Time, Oriented to Situation Alcohol / Substance Use: Not Applicable Psych Involvement: No (comment)  Admission diagnosis:  gangrene foot Patient Active Problem List   Diagnosis Date Noted  . PAD (peripheral artery disease) ([place]) 08/19/2018  . Type II diabetes mellitus, uncontrolled ([place]) 04/07/2018  . ACC/AHA stage B congestive heart failure due to ischemic cardiomyopathy ([place]) 04/07/2018  . Diabetic peripheral neuropathy ([place]) 04/07/2018  . Chronic back pain 03/16/2017  . Gastrointestinal hemorrhage 03/16/2017  . Increased frequency of urination 03/16/2017  . Kidney stone 03/16/2017  . Macular degeneration 03/16/2017  . Malignant neoplasm of skin 03/16/2017  . Mixed hyperlipidemia 03/16/2017  . Recurrent falls 03/16/2017  . Vertigo 03/16/2017  . Weakness of left leg 03/16/2017  . Myocardial infarction ([place]) 03/16/2017  . Coronary arteriosclerosis 03/16/2017  . Cardiac pacemaker in situ 03/16/2017  . Transient cerebral ischemia 03/16/2017  . Elevated troponin 01/13/2017  . Thrombocytopenia ([place]) 01/13/2017  . CKD (chronic kidney disease), stage III ([place]) 01/13/2017  . Benign prostatic hyperplasia 01/11/2017  .  Thyroid disease 01/11/2017  . Chest pain 01/11/2017  . Symptomatic anemia 01/11/2017  . Demand ischemia ([place])   . Atrial fibrillation ([place]) 12/06/2016  . Fatigue 08/06/2016  . Change in consistency of stool 08/06/2016  . Pressure injury of skin 08/04/2016  . Congestive heart failure  ([place]) 07/31/2016  . Atypical chest pain 07/30/2016  . Acute respiratory failure with hypoxia ([place]) 07/30/2016  . Acute respiratory failure with hypoxemia ([place])   . NSTEMI (non-ST elevated myocardial infarction) ([place]) 06/24/2016  . Rectal bleeding 06/23/2016  . Acute blood loss anemia 06/23/2016  . Patient had 2 or more falls in past year 01/20/2014  . Stage 3 chronic renal impairment associated with type 2 diabetes mellitus ([place]) 01/20/2014  . Acute on chronic systolic and diastolic heart failure, NYHA class 4 ([place]) 01/16/2014  . CAD S/P SVG-MO1/OM2 DES 12/11/13 12/13/2013  . Pacemaker implanted 12/12/13 (MDT) 12/13/2013  . Complete heart block ([place]) 12/13/2013  . HOH (hard of hearing) 12/13/2013  . 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 Sick sinus syndrome ([place]) 12/10/2013  . PAF (paroxysmal atrial fibrillation) ([place]) 12/10/2013  . Tachy-brady syndrome ([place]) 12/10/2013  . Symptomatic bradycardia 12/07/2013  . Heart block 03/22/2013  . CAD (coronary artery disease) of artery bypass graft 03/23/2012  . Angina pectoris- rate related 03/23/2012  . Carotid bruit 03/23/2012  . CARDIAC MURMUR 12/03/2009  . Type 2 diabetes with nephropathy ([place]) 12/02/2009  . HYPERCHOLESTEROLEMIA 12/02/2009  . Iron deficiency anemia 12/02/2009  . Essential hypertension 12/02/2009  . CABG '96 12/02/2009  . Allergic rhinitis 12/02/2009  . GERD (gastroesophageal reflux disease) 12/02/2009  . TRANSIENT ISCHEMIC ATTACKS, HX OF 12/02/2009  . NEPHROLITHIASIS, HX OF 12/02/2009   PCP:  Deland Pretty, MD Pharmacy:   [facility] [place], [place] - [address] [place] [address] Phone: [phone] Fax: [phone]     Social Determinants of Health (SDOH) Interventions    Readmission Risk Interventions No flowsheet data found.

## 2018-08-19 NOTE — Progress Notes (Signed)
Daughter of pt reports that she and the pt have discussed amputation and the pt has agreed to go forward with it. She has requested Dr Oneida Alar be notified and then will need info on schedule date etc.  Primary RN notified--JM

## 2018-08-19 NOTE — Discharge Instructions (Signed)
° °  [facility] ° °Discharge Instructions ° °Lower Extremity Angiogram; Angioplasty/Stenting ° °Please refer to the following instructions for your post-procedure care. Your surgeon or physician assistant will discuss any changes with you. ° °Activity ° °Avoid lifting more than 8 pounds (1 gallons of milk) for 72 hours (3 days) after your procedure. You may walk as much as you can tolerate. It's OK to drive after 72 hours. ° °Bathing/Showering ° °You may shower the day after your procedure. If you have a bandage, you may remove it at 24- 48 hours. Clean your incision site with mild soap and water. Pat the area dry with a clean towel. ° °Diet ° °Resume your pre-procedure diet. There are no special food restrictions following this procedure. All patients with peripheral vascular disease should follow a low fat/low cholesterol diet. In order to heal from your surgery, it is CRITICAL to get adequate nutrition. Your body requires vitamins, minerals, and protein. Vegetables are the best source of vitamins and minerals. Vegetables also provide the perfect balance of protein. Processed food has little nutritional value, so try to avoid this. ° °Medications ° °Resume taking all of your medications unless your doctor tells you not to. If your incision is causing pain, you may take over-the-counter pain relievers such as acetaminophen (Tylenol) ° °Follow Up ° °Follow up will be arranged at the time of your procedure. You may have an office visit scheduled or may be scheduled for surgery. Ask your surgeon if you have any questions. ° °Please call us immediately for any of the following conditions: °•Severe or worsening pain your legs or feet at rest or with walking. °•Increased pain, redness, drainage at your groin puncture site. °•Fever of 101 degrees or higher. °•If you have any mild or slow bleeding from your puncture site: lie down, apply firm constant pressure over the area with a piece of  gauze or a clean wash cloth for 30 minutes- no peeking!, call 911 right away if you are still bleeding after 30 minutes, or if the bleeding is heavy and unmanageable. ° °Reduce your risk factors of vascular disease: ° °Stop smoking. If you would like help call [facility] at 1-800-QUIT-NOW (1-800-784-8669) or [facility] at 336-586-4000. °Manage your cholesterol °Maintain a desired weight °Control your diabetes °Keep your blood pressure down ° °If you have any questions, please call the office at [phone] ° °

## 2018-08-19 NOTE — Telephone Encounter (Signed)
-----   Message from Elam Dutch, MD sent at 08/19/2018 11:36 AM EDT ----- Abdominal aortogram with left lower extremity runoff, attempted recanalization left popliteal artery   Operative management: Patient had a failed attempted recanalization of the popliteal artery.  He has very poor runoff via a very diseased peroneal artery.  I believe at this point the only option for him would be a left above-knee amputation for pain control and management of his wound if he decides to go this way in the future.  He could certainly continue his current local wound care as long as this does not make him septic or infected.  These options were discussed with the patient.  I left a message with the patient's daughter by phone to also discuss these options.  Plan will be for discharge home tomorrow and he will think about whether or not he wishes to have a left above-knee amputation at some point in the future.  Ruta Hinds, MD [facility] Office: [phone] Pager: [phone]

## 2018-08-19 NOTE — Interval H&P Note (Signed)
History and Physical Interval Note:  08/19/2018 10:20 AM  Evan [name]  has presented today for surgery, with the diagnosis of PVD ulcer.  The various methods of treatment have been discussed with the patient and family. After consideration of risks, benefits and other options for treatment, the patient has consented to  Procedure(s): ABDOMINAL AORTOGRAM W/LOWER EXTREMITY (Bilateral) as a surgical intervention.  The patient's history has been reviewed, patient examined, no change in status, stable for surgery.  I have reviewed the patient's chart and labs.  Questions were answered to the patient's satisfaction.     Ruta Hinds

## 2018-08-19 NOTE — Progress Notes (Signed)
Site area: rt groin fa sheath pulled and pressure held by Porfirio Mylar Site Prior to Removal:  Level 0 Pressure Applied For: 20 minutes Manual:   yes Patient Status During Pull:  stable Post Pull Site:  Level 0 Post Pull Instructions Given:  yes Post Pull Pulses Present: rt popliteal dopplered Dressing Applied:  Gauze and tegaderm Bedrest begins @ 1430 Comments:

## 2018-08-19 NOTE — Progress Notes (Signed)
Patient daughter states father does want to have the surgery. She wants to make sure Dr. Oneida Alar is aware. Called [facility]. Dr. Oneida Alar returned call, states we will leave patient inpatient over the weekend and plan for surgery on Monday. Daughter made aware and verbalized understanding. Nursing will continue to monitor.

## 2018-08-19 NOTE — Op Note (Signed)
Procedure: Abdominal aortogram with left lower extremity runoff, attempted recanalization left popliteal artery  Preoperative diagnosis: Nonhealing wound left foot  Postoperative diagnosis: Same  Anesthesia: Local  Operative findings: Occlusion left popliteal anterior tibial posterior tibial arteries severe calcific disease one-vessel very diseased peroneal runoff left foot which fills a very small dorsalis pedis artery  Operative details: After team informed consent, the patient taken to the [facility] lab.  The patient was placed in supine position Angie table.  Both groins were prepped and draped in usual sterile fashion.  Local anesthesia was infiltrated over the right common femoral artery.  Ultrasound was used identify the right common femoral artery and femoral bifurcation.  These were patent.  Image was obtained for the patient's record.  Next an introducer needle was used to cannulate the right common femoral artery under ultrasound guidance.  An 035 versa core wire was then threaded up in the abdominal aorta under fluoroscopic guidance.  A 5 French sheath placed over the guidewire in the right common femoral artery.  This was thoroughly flushed with heparinized saline.  5 French pigtail catheter was advanced over the guidewire in the abdominal aorta abdominal aortogram was obtained in AP projection.  Left and right renal arteries are patent.  Infrarenal abdominal aorta is patent.  The left and right common external and internal iliac arteries are all patent.  There is moderate to severe tortuosity of the iliac arteries.  At this point in order to save on contrast we decided to only do a left leg run since the patient had a history of an elevated creatinine in the past.  5 French pigtail catheter was removed over guidewire and exchanged for a 5 [place] crossover catheter.  This was used to selectively catheterize the left common iliac artery.  I then advanced an 035 versa core wire down into the distal  left common femoral artery.  A 4 French straight catheter was then advanced over this and left lower extremity arteriogram was obtained.  The left common femoral profunda femoris and superficial femoral arteries are all widely patent.  Popliteal artery is occluded at the adductor hiatus.  An [place] of popliteal artery does reconstitute via SFA and profunda collaterals which fills for a short segment.  This then fills a single-vessel peroneal artery which gives off a branch which fills a short segment of dorsalis pedis artery.  The anterior tibial and posterior tibial arteries are completely occluded.  There are a large number of collaterals in the calf.  Since the patient was not a very good open operative candidate and did not also have very good target vessels I decided to try to intervene on the patient's left popliteal artery occlusion.  The 5 French sheath was exchanged over a 035 Rosen wire for a 6 [place] flex sheath.  This was advanced up and over the aortic bifurcation and into the distal left superficial femoral artery.  Patient was given 8000 units of heparin and ACT was confirmed to be greater than 250.  I then used an 035 Glidewire advantage and reflux this on itself to try to cross the lesion.  A 5 French straight catheter was used for pushing.  The wire seem to reenter the popliteal artery but a angiogram confirmed that we had a area of dissection and perforation of the popliteal artery.  At this point attempts to cross the lesion were abandoned.  The Glidewire was removed.  The 6 French sheath was pulled back up and over the aortic bifurcation  over a Rosen wire.  This was left in place to be pulled in the holding area.  The patient tolerated procedure well and there were no complications.  Patient was taken to holding area in stable condition.  Operative management: Patient had a failed attempted recanalization of the popliteal artery.  He has very poor runoff via a very diseased peroneal artery.  I  believe at this point the only option for him would be a left above-knee amputation for pain control and management of his wound if he decides to go this way in the future.  He could certainly continue his current local wound care as long as this does not make him septic or infected.  These options were discussed with the patient.  I left a message with the patient's daughter by phone to also discuss these options.  Plan will be for discharge home tomorrow and he will think about whether or not he wishes to have a left above-knee amputation at some point in the future.  Ruta Hinds, MD [facility] Office: [phone] Pager: [phone]

## 2018-08-19 NOTE — Telephone Encounter (Signed)
Documentation for future surgery plan

## 2018-08-20 NOTE — Progress Notes (Signed)
Patient ambulated in hallway with nursing staff with rolator. Patient with complaints of pain in left foot at the completion of ambulation. Patient back to chair. Will monitor patient. Eloyse Causey, Bettina Gavia RN

## 2018-08-20 NOTE — Progress Notes (Signed)
[facility]  Subjective  - ok to have aka   Objective (!) 157/65 60 97.8 F (36.6 C) (Oral) 15 90%  Intake/Output Summary (Last 24 hours) at 08/20/2018 1207 Last data filed at 08/20/2018 0958 Gross per 24 hour  Intake 480 ml  Output 100 ml  Net 380 ml   Right groin small hematoma non pulsatile  Assessment/Planning: Left AKA Monday Recheck creatinine tomorrow  Ruta Hinds 08/20/2018 12:07 PM --  Laboratory Lab Results: Recent Labs    08/18/18 1122  WBC 6.1  HGB 11.9*  HCT 36.9*  PLT 182   BMET Recent Labs    08/18/18 1122 08/19/18 0340  NA 142 140  K 5.0 4.1  CL 105 107  CO2 26 24  GLUCOSE 119* 88  BUN 37* 27*  CREATININE 1.71* 1.39*  CALCIUM 9.1 8.5*    COAG Lab Results  Component Value Date   INR 1.20 01/12/2017   INR 1.08 06/24/2016   INR 1.11 06/23/2016   No results found for: PTT

## 2018-08-21 LAB — CBC
HCT: 35.1 % — ABNORMAL LOW (ref 39.0–52.0)
Hemoglobin: 11.1 g/dL — ABNORMAL LOW (ref 13.0–17.0)
MCH: 30.7 pg (ref 26.0–34.0)
MCHC: 31.6 g/dL (ref 30.0–36.0)
MCV: 97 fL (ref 80.0–100.0)
Platelets: 160 10*3/uL (ref 150–400)
RBC: 3.62 MIL/uL — ABNORMAL LOW (ref 4.22–5.81)
RDW: 14.1 % (ref 11.5–15.5)
WBC: 4.1 10*3/uL (ref 4.0–10.5)
nRBC: 0 % (ref 0.0–0.2)

## 2018-08-21 LAB — BASIC METABOLIC PANEL
Anion gap: 9 (ref 5–15)
BUN: 23 mg/dL (ref 8–23)
CO2: 23 mmol/L (ref 22–32)
Calcium: 8.7 mg/dL — ABNORMAL LOW (ref 8.9–10.3)
Chloride: 109 mmol/L (ref 98–111)
Creatinine, Ser: 1.41 mg/dL — ABNORMAL HIGH (ref 0.61–1.24)
GFR calc Af Amer: 50 mL/min — ABNORMAL LOW (ref >60)
GFR calc non Af Amer: 43 mL/min — ABNORMAL LOW (ref >60)
Glucose, Bld: 104 mg/dL — ABNORMAL HIGH (ref 70–99)
Potassium: 4.8 mmol/L (ref 3.5–5.1)
Sodium: 141 mmol/L (ref 135–145)

## 2018-08-21 LAB — SURGICAL PCR SCREEN
MRSA, PCR: NEGATIVE
Staphylococcus aureus: NEGATIVE

## 2018-08-21 MED ORDER — VANCOMYCIN HCL IN DEXTROSE 1-5 GM/200ML-% IV SOLN
1000.0000 mg | INTRAVENOUS | Status: AC
  Administered 2018-08-22: 1000 mg via INTRAVENOUS
  Filled 2018-08-21: qty 200

## 2018-08-21 NOTE — TOC Progression Note (Signed)
Transition of Care [facility]) - Progression Note    Patient Details  Name: Evan [name] MRN: [id number] Date of Birth: [birth year]  Transition of Care [facility]) CM/SW [place], [place] Phone Number: 08/21/2018, 1:58 PM  Clinical Narrative:     CSW visit with the patient at bedside along with his daughter,Starree. Patient's daughter had a few more questions about the SNF process. CSW explained the SNF process(SNF, inpatient rehab, insurance approval etc..) and advised to review Medicare.gov listing for possible SNF placement - following PT/OT recommendations.   Thurmond Butts, MSW, [facility] Clinical Social Worker [phone]   Expected Discharge Plan: Home/Self Care    Expected Discharge Plan and Services Expected Discharge Plan: Home/Self Care       Living arrangements for the past 2 months: [place] Expected Discharge Date: 08/20/18                                     Social Determinants of Health (SDOH) Interventions    Readmission Risk Interventions No flowsheet data found.

## 2018-08-21 NOTE — Progress Notes (Signed)
Spoke with [place] RN regarding compression wraps to patients leg. She stated she would be up to assess patients legs.  Information then relayed to patient and patient daughter. Patient daughter does not want Left wrap to be removed due to causing patient a lot of pain when that leg/foot is manipulated. To please leave as he is having surgery in the AM. Patient/ daughter agreed to right leg wrap being removed.  Right leg wrap removed as directed by [place] RN and leg cleansed with soap and water and patted dry. Patient continues with edema in that right leg. Heel on skin is intact. Skin is dry and has the appearance of drying blisters on lower aspect of leg. Per patients daughter they were much worse and weeping in previous months. Right leg left open to air at this time. Will monitor patient. Kaizen Ibsen, Bettina Gavia rN

## 2018-08-21 NOTE — Consult Note (Signed)
[place] Nurse wound consult note Reason for Consult: RLE is for amputation in am. Per patient's daughter's request, compression is left in place. Her father experiences sustained pain following dressing care to that extremity.   LLE with no active ulcerations. Edema noted. Will provide Nursing with intervention for edema management (mild compression). Wound type:Venous insufficiency Pressure Injury POA: NA Measurement:N/A Wound bed:N/A Drainage (amount, consistency, odor) N/A Periwound: N/A Dressing procedure/placement/frequency: Conservative care orders for RLE edema management placed. These can be removed for monitoring during and immediately after surgery. I will provide a pressure redistribution chair pad for patient use when OOB to chair. A prophylactic sacral foam dressing is in place.  [place] nursing team will not follow, but will remain available to this patient, the nursing and medical teams.  Please re-consult if needed. Thanks, Maudie Flakes, MSN, RN, [place], Arther Abbott  Pager# [phone]

## 2018-08-21 NOTE — Progress Notes (Addendum)
[facility] [place]  Subjective  - feels ok   Objective (!) 156/46 60 98.3 F (36.8 C) (Oral) 11 97%  Intake/Output Summary (Last 24 hours) at 08/21/2018 1042 Last data filed at 08/21/2018 0949 Gross per 24 hour  Intake 320 ml  Output 730 ml  Net -410 ml   Right groin minimal hematoma  Assessment/Planning: Left AKA tomorrow Will need SNF/rehab post op NpO p midnight Consent Renal function stable   Ruta Hinds 08/21/2018 10:42 AM --  Laboratory Lab Results: Recent Labs    08/18/18 1122 08/21/18 0323  WBC 6.1 4.1  HGB 11.9* 11.1*  HCT 36.9* 35.1*  PLT 182 160   BMET Recent Labs    08/19/18 0340 08/21/18 0323  NA 140 141  K 4.1 4.8  CL 107 109  CO2 24 23  GLUCOSE 88 104*  BUN 27* 23  CREATININE 1.39* 1.41*  CALCIUM 8.5* 8.7*    COAG Lab Results  Component Value Date   INR 1.20 01/12/2017   INR 1.08 06/24/2016   INR 1.11 06/23/2016   No results found for: PTT

## 2018-08-21 NOTE — Progress Notes (Signed)
Patient ambulated in hallway with nursing staff and rolator gait slow and patient with pain in left heel. Back in room in chair. Will monitor patient. Jaymee Tilson, Bettina Gavia RN

## 2018-08-21 NOTE — Progress Notes (Signed)
Patient up to chair this AM. Will monitor patient. Burley Kopka, Bettina Gavia RN

## 2018-08-22 ENCOUNTER — Encounter (HOSPITAL_COMMUNITY)
Admission: AD | Disposition: A | Discharge status: Skilled Nursing Facility | Payer: Self-pay | Source: Ambulatory Visit | Attending: Vascular Surgery

## 2018-08-22 ENCOUNTER — Inpatient Hospital Stay (HOSPITAL_COMMUNITY): Payer: HMO | Admitting: Anesthesiology

## 2018-08-22 ENCOUNTER — Encounter (HOSPITAL_COMMUNITY): Payer: Self-pay | Admitting: Vascular Surgery

## 2018-08-22 ENCOUNTER — Other Ambulatory Visit (HOSPITAL_COMMUNITY): Payer: HMO

## 2018-08-22 ENCOUNTER — Ambulatory Visit: Payer: HMO | Admitting: Physician Assistant

## 2018-08-22 HISTORY — PX: AMPUTATION: SHX166

## 2018-08-22 LAB — GLUCOSE, CAPILLARY
Glucose-Capillary: 107 mg/dL — ABNORMAL HIGH (ref 70–99)
Glucose-Capillary: 137 mg/dL — ABNORMAL HIGH (ref 70–99)

## 2018-08-22 LAB — CREATININE, SERUM
Creatinine, Ser: 1.39 mg/dL — ABNORMAL HIGH (ref 0.61–1.24)
GFR calc Af Amer: 51 mL/min — ABNORMAL LOW (ref >60)
GFR calc non Af Amer: 44 mL/min — ABNORMAL LOW (ref >60)

## 2018-08-22 LAB — CBC
HCT: 34.1 % — ABNORMAL LOW (ref 39.0–52.0)
Hemoglobin: 10.8 g/dL — ABNORMAL LOW (ref 13.0–17.0)
MCH: 31.3 pg (ref 26.0–34.0)
MCHC: 31.7 g/dL (ref 30.0–36.0)
MCV: 98.8 fL (ref 80.0–100.0)
Platelets: 159 10*3/uL (ref 150–400)
RBC: 3.45 MIL/uL — ABNORMAL LOW (ref 4.22–5.81)
RDW: 14 % (ref 11.5–15.5)
WBC: 8.2 10*3/uL (ref 4.0–10.5)
nRBC: 0 % (ref 0.0–0.2)

## 2018-08-22 LAB — PROTIME-INR
INR: 1.1 (ref 0.8–1.2)
Prothrombin Time: 14.3 seconds (ref 11.4–15.2)

## 2018-08-22 LAB — TYPE AND SCREEN
ABO/RH(D): O POS
Antibody Screen: NEGATIVE

## 2018-08-22 LAB — APTT: aPTT: 28 seconds (ref 24–36)

## 2018-08-22 SURGERY — AMPUTATION, ABOVE KNEE
Anesthesia: General | Laterality: Left

## 2018-08-22 MED ORDER — SUCCINYLCHOLINE CHLORIDE 20 MG/ML IJ SOLN
INTRAMUSCULAR | Status: DC | PRN
  Administered 2018-08-22: 60 mg via INTRAVENOUS

## 2018-08-22 MED ORDER — ALUM & MAG HYDROXIDE-SIMETH 200-200-20 MG/5ML PO SUSP: 15.0000 mL | ORAL | Status: DC | PRN

## 2018-08-22 MED ORDER — ONDANSETRON HCL 4 MG/2ML IJ SOLN
INTRAMUSCULAR | Status: DC | PRN
  Administered 2018-08-22: 4 mg via INTRAVENOUS

## 2018-08-22 MED ORDER — POTASSIUM CHLORIDE CRYS ER 20 MEQ PO TBCR: 20.0000 meq | EXTENDED_RELEASE_TABLET | Freq: Every day | ORAL | Status: DC | PRN

## 2018-08-22 MED ORDER — PANTOPRAZOLE SODIUM 40 MG PO TBEC
40.0000 mg | DELAYED_RELEASE_TABLET | Freq: Every day | ORAL | Status: DC
  Administered 2018-08-22 – 2018-09-02 (×12): 40 mg via ORAL
  Filled 2018-08-22 (×12): qty 1

## 2018-08-22 MED ORDER — LACTATED RINGERS IV SOLN
INTRAVENOUS | Status: DC
  Administered 2018-08-22: 09:00:00 via INTRAVENOUS

## 2018-08-22 MED ORDER — LACTATED RINGERS IV SOLN
INTRAVENOUS | Status: DC | PRN
  Administered 2018-08-22: 09:00:00 via INTRAVENOUS

## 2018-08-22 MED ORDER — HYDRALAZINE HCL 20 MG/ML IJ SOLN
5.0000 mg | Freq: Once | INTRAMUSCULAR | Status: AC
  Administered 2018-08-22: 5 mg via INTRAVENOUS

## 2018-08-22 MED ORDER — ACETAMINOPHEN 325 MG PO TABS
325.0000 mg | ORAL_TABLET | ORAL | Status: DC | PRN
  Administered 2018-08-25 – 2018-08-27 (×3): 650 mg via ORAL
  Filled 2018-08-22 (×4): qty 2

## 2018-08-22 MED ORDER — DEXTROSE-NACL 5-0.45 % IV SOLN
INTRAVENOUS | Status: DC
  Administered 2018-08-22 – 2018-08-25 (×4): via INTRAVENOUS

## 2018-08-22 MED ORDER — ONDANSETRON HCL 4 MG/2ML IJ SOLN
4.0000 mg | Freq: Four times a day (QID) | INTRAMUSCULAR | Status: DC | PRN
  Administered 2018-08-28: 4 mg via INTRAVENOUS
  Filled 2018-08-22 (×2): qty 2

## 2018-08-22 MED ORDER — MORPHINE SULFATE (PF) 2 MG/ML IV SOLN: 2.0000 mg | INTRAVENOUS | Status: DC | PRN

## 2018-08-22 MED ORDER — HYDRALAZINE HCL 20 MG/ML IJ SOLN: 5.0000 mg | INTRAMUSCULAR | Status: DC | PRN

## 2018-08-22 MED ORDER — PROPOFOL 10 MG/ML IV BOLUS
INTRAVENOUS | Status: DC | PRN
  Administered 2018-08-22: 100 mg via INTRAVENOUS

## 2018-08-22 MED ORDER — FENTANYL CITRATE (PF) 100 MCG/2ML IJ SOLN
INTRAMUSCULAR | Status: DC | PRN
  Administered 2018-08-22: 50 ug via INTRAVENOUS
  Administered 2018-08-22: 25 ug via INTRAVENOUS
  Administered 2018-08-22: 50 ug via INTRAVENOUS
  Administered 2018-08-22: 25 ug via INTRAVENOUS
  Administered 2018-08-22: 50 ug via INTRAVENOUS

## 2018-08-22 MED ORDER — HYDRALAZINE HCL 20 MG/ML IJ SOLN
INTRAMUSCULAR | Status: AC
  Filled 2018-08-22: qty 1

## 2018-08-22 MED ORDER — FENTANYL CITRATE (PF) 250 MCG/5ML IJ SOLN
INTRAMUSCULAR | Status: AC
  Filled 2018-08-22: qty 5

## 2018-08-22 MED ORDER — LABETALOL HCL 5 MG/ML IV SOLN
10.0000 mg | INTRAVENOUS | Status: DC | PRN
  Administered 2018-08-26 – 2018-09-01 (×2): 10 mg via INTRAVENOUS
  Filled 2018-08-22 (×2): qty 4

## 2018-08-22 MED ORDER — 0.9 % SODIUM CHLORIDE (POUR BTL) OPTIME
TOPICAL | Status: DC | PRN
  Administered 2018-08-22: 1000 mL

## 2018-08-22 MED ORDER — VANCOMYCIN HCL 10 G IV SOLR
1250.0000 mg | INTRAVENOUS | Status: DC
  Administered 2018-08-23: 1250 mg via INTRAVENOUS
  Filled 2018-08-22 (×2): qty 1250

## 2018-08-22 MED ORDER — DOCUSATE SODIUM 100 MG PO CAPS
100.0000 mg | ORAL_CAPSULE | Freq: Every day | ORAL | Status: DC
  Administered 2018-08-23 – 2018-09-02 (×8): 100 mg via ORAL
  Filled 2018-08-22 (×8): qty 1

## 2018-08-22 MED ORDER — ACETAMINOPHEN 325 MG RE SUPP: 325.0000 mg | RECTAL | Status: DC | PRN

## 2018-08-22 MED ORDER — PHENOL 1.4 % MT LIQD: 1.0000 | OROMUCOSAL | Status: DC | PRN

## 2018-08-22 MED ORDER — PROPOFOL 10 MG/ML IV BOLUS
INTRAVENOUS | Status: AC
  Filled 2018-08-22: qty 20

## 2018-08-22 MED ORDER — LIDOCAINE 2% (20 MG/ML) 5 ML SYRINGE
INTRAMUSCULAR | Status: DC | PRN
  Administered 2018-08-22: 60 mg via INTRAVENOUS
  Administered 2018-08-22: 50 mg via INTRAVENOUS

## 2018-08-22 MED ORDER — MAGNESIUM SULFATE 2 GM/50ML IV SOLN: 2.0000 g | Freq: Every day | INTRAVENOUS | Status: DC | PRN

## 2018-08-22 MED ORDER — DEXAMETHASONE SODIUM PHOSPHATE 10 MG/ML IJ SOLN
INTRAMUSCULAR | Status: DC | PRN
  Administered 2018-08-22: 5 mg via INTRAVENOUS

## 2018-08-22 MED ORDER — ONDANSETRON HCL 4 MG/2ML IJ SOLN: 4.0000 mg | Freq: Once | INTRAMUSCULAR | Status: DC | PRN

## 2018-08-22 MED ORDER — FENTANYL CITRATE (PF) 100 MCG/2ML IJ SOLN
INTRAMUSCULAR | Status: AC
  Filled 2018-08-22: qty 2

## 2018-08-22 MED ORDER — METOPROLOL TARTRATE 5 MG/5ML IV SOLN: 2.0000 mg | INTRAVENOUS | Status: DC | PRN

## 2018-08-22 MED ORDER — FENTANYL CITRATE (PF) 100 MCG/2ML IJ SOLN
25.0000 ug | INTRAMUSCULAR | Status: DC | PRN
  Administered 2018-08-22: 25 ug via INTRAVENOUS

## 2018-08-22 MED ORDER — SODIUM CHLORIDE 0.9 % IV SOLN
INTRAVENOUS | Status: DC | PRN
  Administered 2018-08-22: 50 ug/min via INTRAVENOUS

## 2018-08-22 MED ORDER — OXYCODONE HCL 5 MG PO TABS
5.0000 mg | ORAL_TABLET | ORAL | Status: DC | PRN
  Administered 2018-08-22: 10 mg via ORAL
  Administered 2018-08-22: 5 mg via ORAL
  Administered 2018-08-23 – 2018-08-24 (×3): 10 mg via ORAL
  Filled 2018-08-22 (×3): qty 2
  Filled 2018-08-22: qty 1
  Filled 2018-08-22 (×3): qty 2

## 2018-08-22 MED ORDER — ENOXAPARIN SODIUM 40 MG/0.4ML ~~LOC~~ SOLN
40.0000 mg | SUBCUTANEOUS | Status: DC
  Administered 2018-08-23 – 2018-08-24 (×2): 40 mg via SUBCUTANEOUS
  Filled 2018-08-22 (×2): qty 0.4

## 2018-08-22 MED ORDER — GUAIFENESIN-DM 100-10 MG/5ML PO SYRP: 15.0000 mL | ORAL_SOLUTION | ORAL | Status: DC | PRN

## 2018-08-22 SURGICAL SUPPLY — 44 items
BLADE SAW RECIP 87.9 MT (BLADE) ×2 IMPLANT
BNDG COHESIVE 4X5 TAN STRL (GAUZE/BANDAGES/DRESSINGS) ×2 IMPLANT
BNDG COHESIVE 6X5 TAN STRL LF (GAUZE/BANDAGES/DRESSINGS) ×2 IMPLANT
BNDG ELASTIC 4X5.8 VLCR STR LF (GAUZE/BANDAGES/DRESSINGS) ×1 IMPLANT
BNDG ELASTIC 6X10 VLCR STRL LF (GAUZE/BANDAGES/DRESSINGS) ×2 IMPLANT
BNDG ELASTIC 6X5.8 VLCR STR LF (GAUZE/BANDAGES/DRESSINGS) ×2 IMPLANT
BNDG GAUZE ELAST 4 BULKY (GAUZE/BANDAGES/DRESSINGS) ×2 IMPLANT
CANISTER SUCT 3000ML PPV (MISCELLANEOUS) ×2 IMPLANT
CLIP VESOCCLUDE MED 6/CT (CLIP) ×1 IMPLANT
COVER BACK TABLE 60X90IN (DRAPES) ×2 IMPLANT
COVER SURGICAL LIGHT HANDLE (MISCELLANEOUS) ×2 IMPLANT
COVER WAND RF STERILE (DRAPES) ×2 IMPLANT
DRAIN CHANNEL 19F RND (DRAIN) IMPLANT
DRAPE HALF SHEET 40X57 (DRAPES) ×2 IMPLANT
DRAPE ORTHO SPLIT 77X108 STRL (DRAPES) ×2
DRAPE SURG ORHT 6 SPLT 77X108 (DRAPES) ×2 IMPLANT
DRSG ADAPTIC 3X8 NADH LF (GAUZE/BANDAGES/DRESSINGS) ×2 IMPLANT
ELECT CAUTERY BLADE 6.4 (BLADE) ×2 IMPLANT
ELECT REM PT RETURN 9FT ADLT (ELECTROSURGICAL) ×2
ELECTRODE REM PT RTRN 9FT ADLT (ELECTROSURGICAL) ×1 IMPLANT
EVACUATOR SILICONE 100CC (DRAIN) IMPLANT
GAUZE SPONGE 4X4 12PLY STRL (GAUZE/BANDAGES/DRESSINGS) ×2 IMPLANT
GAUZE SPONGE 4X4 12PLY STRL LF (GAUZE/BANDAGES/DRESSINGS) ×1 IMPLANT
GLOVE BIO SURGEON STRL SZ7.5 (GLOVE) ×2 IMPLANT
GOWN STRL REUS W/ TWL LRG LVL3 (GOWN DISPOSABLE) ×3 IMPLANT
GOWN STRL REUS W/TWL LRG LVL3 (GOWN DISPOSABLE) ×3
KIT BASIN OR (CUSTOM PROCEDURE TRAY) ×2 IMPLANT
KIT TURNOVER KIT B (KITS) ×2 IMPLANT
NS IRRIG 1000ML POUR BTL (IV SOLUTION) ×2 IMPLANT
PACK GENERAL/GYN (CUSTOM PROCEDURE TRAY) ×2 IMPLANT
PAD ARMBOARD 7.5X6 YLW CONV (MISCELLANEOUS) ×4 IMPLANT
STAPLER VISISTAT 35W (STAPLE) ×2 IMPLANT
STOCKINETTE IMPERVIOUS LG (DRAPES) ×2 IMPLANT
SUT ETHILON 3 0 PS 1 (SUTURE) IMPLANT
SUT SILK 2 0 SH (SUTURE) IMPLANT
SUT SILK 2 0 SH CR/8 (SUTURE) ×2 IMPLANT
SUT SILK 2 0 TIES 10X30 (SUTURE) ×2 IMPLANT
SUT VIC AB 2-0 CT1 18 (SUTURE) IMPLANT
SUT VIC AB 2-0 SH 18 (SUTURE) ×4 IMPLANT
SUT VIC AB 3-0 SH 27 (SUTURE) ×3
SUT VIC AB 3-0 SH 27X BRD (SUTURE) ×2 IMPLANT
TOWEL GREEN STERILE (TOWEL DISPOSABLE) ×4 IMPLANT
UNDERPAD 30X30 (UNDERPADS AND DIAPERS) ×2 IMPLANT
WATER STERILE IRR 1000ML POUR (IV SOLUTION) ×2 IMPLANT

## 2018-08-22 NOTE — Transfer of Care (Signed)
Immediate Anesthesia Transfer of Care Note  Patient: Evan [name]  Procedure(s) Performed: LEFT ABOVE KNEE AMPUTATION (Left )  Patient Location: PACU  Anesthesia Type:General  Level of Consciousness: awake  Airway & Oxygen Therapy: Patient Spontanous Breathing and Patient connected to nasal cannula oxygen  Post-op Assessment: Report given to RN, Post -op Vital signs reviewed and stable and Patient moving all extremities  Post vital signs: Reviewed and stable  Last Vitals:  Vitals Value Taken Time  BP 150/58 08/22/18 1138  Temp    Pulse 59 08/22/18 1139  Resp 9 08/22/18 1139  SpO2 100 % 08/22/18 1139  Vitals shown include unvalidated device data.  Last Pain:  Vitals:   08/22/18 0533  TempSrc: Oral  PainSc:          Complications: No apparent anesthesia complications

## 2018-08-22 NOTE — Anesthesia Procedure Notes (Signed)
Procedure Name: Intubation Date/Time: 08/22/2018 10:10 AM Performed by: Neldon Newport, CRNA Pre-anesthesia Checklist: Timeout performed, Patient being monitored, Suction available, Emergency Drugs available and Patient identified Patient Re-evaluated:Patient Re-evaluated prior to induction Oxygen Delivery Method: Circle system utilized Preoxygenation: Pre-oxygenation with 100% oxygen Induction Type: IV induction Ventilation: Mask ventilation without difficulty Laryngoscope Size: Mac and 4 Grade View: Grade II Tube type: Oral Tube size: 7.5 mm Number of attempts: 1 Placement Confirmation: breath sounds checked- equal and bilateral and ETT inserted through vocal cords under direct vision Secured at: 23 cm Tube secured with: Tape Dental Injury: Teeth and Oropharynx as per pre-operative assessment

## 2018-08-22 NOTE — Progress Notes (Signed)
Patient arrived from pacu to 4e16 patient placed on monitor and vital signs obtained. Dressing on stump intact. Will monitor patient. Yoskar Murrillo, Bettina Gavia rN

## 2018-08-22 NOTE — Op Note (Signed)
[facility] OPERATIVE NOTE   Procedure: Left above knee amputation  Surgeon(s): Elam Dutch, MD  ASSISTANT: Delena Serve  Anesthesia: General  Specimens: Left leg  PROCEDURE DETAIL: After obtaining informed consent, the patient was taken to the operating room. The patient was placed in supine position the operating room table. After induction of general anesthesia and endotracheal intubation the patient's Foley catheter was placed. Next patient's entire left lower extremity was prepped and draped in usual sterile fashion. A circumferential incision was made on the left leg just above the knee. The incision was carried down into the sucutaneous tissues down to level the saphenous vein. This was ligated and divided between silk ties. Soft tissues were taken down as well as the muscle and fascia with cautery. The superficial femoral artery and vein were dissected free circumferentially clamped and divided.  The artery was heavily calcified and thickened.  These were suture ligated proximally. There are also large numbers of collaterals that were heavily calcified as well.  Remainder of the soft tissues were taken down with cautery. The periosteum was raised on the femur approximately 5 cm above the skin edge. The femur was divided at this level. The leg was passed off the table as a specimen. Hemostasis was obtained. The wound was thoroughly irrigated with normal saline solution. The fascial edges were reapproximated using interrupted 2 0 Vicryl sutures. The subcutaneous tissues reapproximated using a running 3-0 Vicryl suture. The skin was closed staples. Patient tolerated procedure well and there were no complications. Instrument sponge and needle counts correct in the case. Patient was taken to recovery in stable condition.  Ruta Hinds, MD [facility] [place] Office: [phone] Pager: [phone]

## 2018-08-22 NOTE — Progress Notes (Signed)
Pharmacy Antibiotic Note  Evan [name] is a 85 y.o. male admitted on 08/18/2018 with surgical prophylaxis.  Pharmacy has been consulted for vancomycin dosing.  Received pre-op vancomycin dose today at ~ 9 AM.  Scr 1.41.  Plan: Vancomycin 1250 mg IV every 36 hours.  Goal trough 10-15 mcg/mL. Estimated AUC 483  Height: 5\' 7"  (170.2 cm) Weight: 160 lb 4.8 oz (72.7 kg) IBW/kg (Calculated) : 66.1  Temp (24hrs), Avg:98.1 F (36.7 C), Min:97.8 F (36.6 C), Max:98.5 F (36.9 C)  Recent Labs  Lab 08/18/18 1122 08/19/18 0340 08/21/18 0323  WBC 6.1  --  4.1  CREATININE 1.71* 1.39* 1.41*    Estimated Creatinine Clearance: 31.3 mL/min (A) (by C-G formula based on SCr of 1.41 mg/dL (H)).    Allergies  Allergen Reactions  . Clonidine Other (See Comments)    Unknown reaction.  . Codeine     unknown reaction   . Demerol [Meperidine]     unknown  . Meperidine Hcl Other (See Comments)    Hypotension  . Motrin [Ibuprofen] Other (See Comments)    Renal issues  . Penicillins Other (See Comments)    Did it involve swelling of the face/tongue/throat, SOB, or low BP? Unknown Did it involve sudden or severe rash/hives, skin peeling, or any reaction on the inside of your mouth or nose? Unknown Did you need to seek medical attention at a hospital or doctor's office? Unknown When did it last happen?Unknown If all above answers are "NO", may proceed with cephalosporin use.   . Heparin Other (See Comments)    hypotension  . Metformin And Related Diarrhea     Thank you for allowing pharmacy to be a part of this patient's care.  Marguerite Olea, [facility] Clinical Pharmacist Phone [phone]  08/22/2018 1:35 PM

## 2018-08-22 NOTE — Anesthesia Postprocedure Evaluation (Signed)
Anesthesia Post Note  Patient: Virgil J Mccready  Procedure(s) Performed: LEFT ABOVE KNEE AMPUTATION (Left )     Patient location during evaluation: PACU Anesthesia Type: General Level of consciousness: awake Pain management: pain level controlled Vital Signs Assessment: post-procedure vital signs reviewed and stable Respiratory status: spontaneous breathing, nonlabored ventilation, respiratory function stable and patient connected to nasal cannula oxygen Cardiovascular status: blood pressure returned to baseline and stable Postop Assessment: no apparent nausea or vomiting Anesthetic complications: no    Last Vitals:  Vitals:   08/22/18 1600 08/22/18 2000  BP: (!) 139/51 (!) 130/47  Pulse: 60 60  Resp: 16 10  Temp:  37 C  SpO2: 90% 96%    Last Pain:  Vitals:   08/22/18 2000  TempSrc: Oral  PainSc: 0-No pain                 Ryan P Ellender

## 2018-08-22 NOTE — Anesthesia Preprocedure Evaluation (Addendum)
Anesthesia Evaluation  Patient identified by MRN, date of birth, ID band Patient awake    Reviewed: Allergy & Precautions, NPO status , Patient's Chart, lab work & pertinent test results  Airway Mallampati: III  TM Distance: >3 FB Neck ROM: Full    Dental  (+) Missing, Poor Dentition   Pulmonary Patient abstained from smoking., former smoker,    Pulmonary exam normal breath sounds clear to auscultation       Cardiovascular hypertension, Pt. on medications and Pt. on home beta blockers + CAD, + Past MI, + Cardiac Stents, + CABG, + Peripheral Vascular Disease and +CHF  + dysrhythmias Atrial Fibrillation + pacemaker + Valvular Problems/Murmurs AS  Rhythm:Regular Rate:Normal + Systolic murmurs ECG: rate 62. AV dual-paced rhythm   Neuro/Psych Vertigo  Neuromuscular disease    GI/Hepatic negative GI ROS, Neg liver ROS,   Endo/Other  diabetesHypothyroidism   Renal/GU Renal disease     Musculoskeletal negative musculoskeletal ROS (+)   Abdominal   Peds  Hematology  (+) Blood dyscrasia, anemia , HLD   Anesthesia Other Findings peripheral vascular disease  Reproductive/Obstetrics                           Anesthesia Physical Anesthesia Plan  ASA: III  Anesthesia Plan: General   Post-op Pain Management:    Induction: Intravenous  PONV Risk Score and Plan: 2 and Ondansetron, Dexamethasone and Treatment may vary due to age or medical condition  Airway Management Planned: Oral ETT  Additional Equipment:   Intra-op Plan:   Post-operative Plan: Extubation in OR  Informed Consent: I have reviewed the patients History and Physical, chart, labs and discussed the procedure including the risks, benefits and alternatives for the proposed anesthesia with the patient or authorized representative who has indicated his/her understanding and acceptance.     Dental advisory given  Plan Discussed with:  CRNA  Anesthesia Plan Comments:         Anesthesia Quick Evaluation

## 2018-08-22 NOTE — Interval H&P Note (Signed)
History and Physical Interval Note:  No endovascular options.  Not a bypass candidate.  Needs palliative AKA for sepsis/pain control.  Risks benefits possible complications discussed with pt and daughter.  08/22/2018 9:26 AM  Hagop Georgena Spurling  has presented today for surgery, with the diagnosis of peripheral vascular disease.  The various methods of treatment have been discussed with the patient and family. After consideration of risks, benefits and other options for treatment, the patient has consented to  Procedure(s): AMPUTATION ABOVE KNEE (Left) as a surgical intervention.  The patient's history has been reviewed, patient examined, no change in status, stable for surgery.  I have reviewed the patient's chart and labs.  Questions were answered to the patient's satisfaction.     Ruta Hinds

## 2018-08-22 NOTE — Care Management Important Message (Signed)
Important Message  Patient Details  Name: Evan [name] MRN: [id number] Date of Birth: 05/28/1923   Medicare Important Message Given:  Yes     Shelda Altes 08/22/2018, 1:16 PM

## 2018-08-23 ENCOUNTER — Encounter (HOSPITAL_COMMUNITY): Payer: Self-pay | Admitting: Vascular Surgery

## 2018-08-23 LAB — BASIC METABOLIC PANEL
Anion gap: 8 (ref 5–15)
BUN: 22 mg/dL (ref 8–23)
CO2: 21 mmol/L — ABNORMAL LOW (ref 22–32)
Calcium: 7.8 mg/dL — ABNORMAL LOW (ref 8.9–10.3)
Chloride: 108 mmol/L (ref 98–111)
Creatinine, Ser: 1.43 mg/dL — ABNORMAL HIGH (ref 0.61–1.24)
GFR calc Af Amer: 49 mL/min — ABNORMAL LOW (ref >60)
GFR calc non Af Amer: 42 mL/min — ABNORMAL LOW (ref >60)
Glucose, Bld: 204 mg/dL — ABNORMAL HIGH (ref 70–99)
Potassium: 4.8 mmol/L (ref 3.5–5.1)
Sodium: 137 mmol/L (ref 135–145)

## 2018-08-23 LAB — CBC
HCT: 30.4 % — ABNORMAL LOW (ref 39.0–52.0)
Hemoglobin: 9.7 g/dL — ABNORMAL LOW (ref 13.0–17.0)
MCH: 31 pg (ref 26.0–34.0)
MCHC: 31.9 g/dL (ref 30.0–36.0)
MCV: 97.1 fL (ref 80.0–100.0)
Platelets: 155 10*3/uL (ref 150–400)
RBC: 3.13 MIL/uL — ABNORMAL LOW (ref 4.22–5.81)
RDW: 14 % (ref 11.5–15.5)
WBC: 8.3 10*3/uL (ref 4.0–10.5)
nRBC: 0 % (ref 0.0–0.2)

## 2018-08-23 NOTE — Consult Note (Signed)
PV Navigator consult acknowledged and chart reviewed. Working from remote location at this time.   85y/o gentleman admitted with L foot pain x 57months and non-healing wounds to L foot. Patient has been followed by [facility] and recently referred due to non-healing wounds and poor arterial perfusion. VAS [place] ABI performed on 07/13/2018 R=Noncompressable  L=0.16. Abdominal aortogram on 08/19/2018 showing CLI to L lower extremity and patient underwent L above the knee amputation yesterday.  Medical history to include DM, CAD, CHF, HTN, Former smoker, Pacemaker placement and CABG in 1996.  Noted IP rehab recommendation for SNF placement post discharge for prolonged rehab. Patient already benefits from Chronic Special Needs Program.   Will reach out to daughter to see if additional needs/barriers prior to transition of care.  Thank you for this consult. Cletis Media RN BSN CWS [place] [phone]

## 2018-08-23 NOTE — Evaluation (Signed)
Occupational Therapy Evaluation Patient Details Name: Evan [name] MRN: [id number] DOB: 1923-05-30 Today's Date: 08/23/2018    History of Present Illness pt is a 85 y/o male admitted for evaluation of left foot pain and ulceration with recent arterial study showing poor perfusion of that L leg.  Pt s/p attempted recanalization left popliteal artery without significant success.   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  PMHx:  arthritis, CAD, CHF, DM2, HTN, pAFIB, vertigo.   Clinical Impression   PTA patient independent with rollator.  Admitted for above and limited by problem list below, including impaired balance, decreased activity tolerance, generalized weakness, and decreased problem solving. Patient currently requires mod assist +2 for transfers, total assist for LB ADLs and min assist for UB ADLs.  He has a very supportive family, and family plans to move him to [place] after rehab.  Believe he will benefit from intensive CIR level rehab in order to maximize independence and safety with ADLs/mobility. Will follow acutely.     Follow Up Recommendations  CIR;Supervision/Assistance - 24 hour    Equipment Recommendations  3 in 1 bedside commode    Recommendations for Other Services       Precautions / Restrictions Precautions Precautions: Fall Restrictions Weight Bearing Restrictions: Yes Other Position/Activity Restrictions: L AKA, NWB      Mobility Bed Mobility Overal bed mobility: Needs Assistance Bed Mobility: Supine to Sit     Supine to sit: Mod assist;+2 for safety/equipment;Max assist     General bed mobility comments: bridging with cues and moderate assist.  cues and truncal assist to come foward and up via L elbow.    Transfers Overall transfer level: Needs assistance   Transfers: Sit to/from [employer];Lateral/Scoot Transfers Sit to Stand: Mod assist;+2 physical assistance   Squat pivot transfers: Mod assist;+2 safety/equipment    Lateral/Scoot Transfers:  Min guard(with effort) General transfer comment: sit to stand with RW mod assist +2 to power up, safety and balance, transitioned to recliner with side scooting then squat pivot transfers with physical support, balance safety and cueing for technique     Balance Overall balance assessment: Needs assistance Sitting-balance support: Single extremity supported;No upper extremity supported Sitting balance-Leahy Scale: Fair Sitting balance - Comments: intermittent min guard, overall close supervision    Standing balance support: Bilateral upper extremity supported;During functional activity Standing balance-Leahy Scale: Poor                             ADL either performed or assessed with clinical judgement   ADL Overall ADL's : Needs assistance/impaired     Grooming: Set up;Sitting   Upper Body Bathing: Set up;Sitting   Lower Body Bathing: Moderate assistance;+2 for physical assistance;Sit to/from stand   Upper Body Dressing : Set up;Sitting   Lower Body Dressing: Total assistance;+2 for physical assistance;Sit to/from stand   Toilet Transfer: Moderate assistance;+2 for physical assistance;Squat-pivot Toilet Transfer Details (indicate cue type and reason): simulated to reclienr          Functional mobility during ADLs: Moderate assistance;+2 for physical assistance General ADL Comments: pt limited by impaired balance, generalized weakness      Vision         Perception     Praxis      Pertinent Vitals/Pain Pain Assessment: No/denies pain     Hand Dominance Right   Extremity/Trunk Assessment Upper Extremity Assessment Upper Extremity Assessment: Generalized weakness   Lower Extremity Assessment Lower Extremity Assessment:  Defer to PT evaluation RLE Deficits / Details: general weakness, but functional to attain sub maximal upright stance in the RW LLE Deficits / Details: moves against gravity in flexion/ext, ab/add, in small arcs. LLE Coordination:  decreased fine motor       Communication Communication Communication: HOH   Cognition Arousal/Alertness: Awake/alert Behavior During Therapy: WFL for tasks assessed/performed Overall Cognitive Status: Impaired/Different from baseline Area of Impairment: Awareness;Problem solving                           Awareness: Emergent Problem Solving: Slow processing;Difficulty sequencing;Requires verbal cues;Requires tactile cues     General Comments  reviewed phantom pain mgmt techniques    Exercises     Shoulder Instructions      Home Living Family/patient expects to be discharged to:: Private residence Living Arrangements: Alone Available Help at Discharge: Available PRN/intermittently Type of Home: Other(Comment)(Trying to settle into an ADA apartment in [place]) Home Access: Level entry     Home Layout: One level     Bathroom Shower/Tub: (Hoping to get a tub shower combo)         Home Equipment: Walker - 2 wheels;Walker - 4 wheels(lift chair)          Prior Functioning/Environment Level of Independence: Independent;Independent with assistive device(s)        Comments: used rollator for mobility, independent ADLS         OT Problem List: Decreased strength;Decreased activity tolerance;Impaired balance (sitting and/or standing);Decreased knowledge of use of DME or AE;Decreased knowledge of precautions      OT Treatment/Interventions: Self-care/ADL training;Therapeutic exercise;DME and/or AE instruction;Therapeutic activities;Patient/family education;Balance training    OT Goals(Current goals can be found in the care plan section) Acute Rehab OT Goals Patient Stated Goal: get rehab ([place] vs [place]) before going to [employer]. living. OT Goal Formulation: With patient Time For Goal Achievement: 09/06/18 Potential to Achieve Goals: Good  OT Frequency: Min 2X/week   Barriers to D/C:            Co-evaluation PT/OT/SLP Co-Evaluation/Treatment:  Yes Reason for Co-Treatment: For patient/therapist safety;To address functional/ADL transfers PT goals addressed during session: Mobility/safety with mobility OT goals addressed during session: ADL's and self-care      AM-PAC OT "6 Clicks" Daily Activity     Outcome Measure Help from another person eating meals?: None Help from another person taking care of personal grooming?: A Little Help from another person toileting, which includes using toliet, bedpan, or urinal?: Total Help from another person bathing (including washing, rinsing, drying)?: A Lot Help from another person to put on and taking off regular upper body clothing?: A Little Help from another person to put on and taking off regular lower body clothing?: Total 6 Click Score: 14   End of Session Equipment Utilized During Treatment: Gait belt;Rolling walker Nurse Communication: Mobility status  Activity Tolerance: Patient tolerated treatment well Patient left: in chair;with call bell/phone within reach;with chair alarm set;with family/visitor present  OT Visit Diagnosis: Other abnormalities of gait and mobility (R26.89);Muscle weakness (generalized) (M62.81)                Time: 7371-0626 OT Time Calculation (min): 56 min Charges:  OT General Charges $OT Visit: 1 Visit OT Evaluation $OT Eval Moderate Complexity: 1 Mod OT Treatments $Self Care/Home Management : 8-22 mins  Delight Stare, OT Acute Rehabilitation Services Pager [phone] Office [phone]   Delight Stare 08/23/2018, 1:05 PM

## 2018-08-23 NOTE — Evaluation (Signed)
Physical Therapy Evaluation Patient Details Name: Evan [name] MRN: [id number] DOB: 02/27/1923 Today's Date: 08/23/2018   History of Present Illness  pt is a 85 y/o male admitted for evaluation of left foot pain and ulceration with recent arterial study showing poor perfusion of that L leg.  Pt s/p attempted recanalization left popliteal artery without significant success.   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  PMHx:  arthritis, CAD, CHF, DM2, HTN, pAFIB, vertigo.  Clinical Impression  Pt admitted with/for L LE vascular assessment/treat.  S/P L AKA.  Pt needing moderate assist to light max assist for basic mobility.  Pt currently limited functionally due to the problems listed. ( See problems list.)   Pt will benefit from PT to maximize function and safety in order to get ready for next venue listed below.     Follow Up Recommendations CIR;Supervision/Assistance - 24 hour    Equipment Recommendations  Other (comment)(TBA further)    Recommendations for Other Services Rehab consult     Precautions / Restrictions Precautions Precautions: Fall      Mobility  Bed Mobility Overal bed mobility: Needs Assistance Bed Mobility: Supine to Sit     Supine to sit: Mod assist;+2 for safety/equipment;Max assist     General bed mobility comments: bridging with cues and moderate assist.  cues and truncal assist to come foward and up via L elbow.    Transfers Overall transfer level: Needs assistance   Transfers: Sit to/from [employer];Lateral/Scoot Transfers Sit to Stand: Mod assist;+2 physical assistance   Squat pivot transfers: Mod assist;+2 safety/equipment    Lateral/Scoot Transfers: Min guard(with effort) General transfer comment: Effortful scooting, cues for technique to come forward and general assist for forward and boost up.  Ambulation/Gait             General Gait Details: pt unable to lift weight off his R foot to swing today.  Stairs            Wheelchair  Mobility    Modified Rankin (Stroke Patients Only)       Balance Overall balance assessment: Needs assistance Sitting-balance support: Single extremity supported;No upper extremity supported Sitting balance-Leahy Scale: Fair Sitting balance - Comments: needs UE's with challenge to balance o/w can sit EOB without UE's   Standing balance support: Bilateral upper extremity supported Standing balance-Leahy Scale: Poor                               Pertinent Vitals/Pain Pain Assessment: No/denies pain    Home Living Family/patient expects to be discharged to:: Private residence Living Arrangements: Alone Available Help at Discharge: Available PRN/intermittently Type of Home: Other(Comment)(Trying to settle into an ADA apartment in [place]) Home Access: Level entry     Home Layout: One level Home Equipment: Environmental consultant - 2 wheels;Walker - 4 wheels(lift chair)      Prior Function Level of Independence: Independent;Independent with assistive device(s)               Hand Dominance        Extremity/Trunk Assessment   Upper Extremity Assessment Upper Extremity Assessment: Defer to OT evaluation    Lower Extremity Assessment Lower Extremity Assessment: RLE deficits/detail;LLE deficits/detail RLE Deficits / Details: general weakness, but functional to attain sub maximal upright stance in the RW LLE Deficits / Details: moves against gravity in flexion/ext, ab/add, in small arcs. LLE Coordination: decreased fine motor       Communication  Communication: HOH  Cognition Arousal/Alertness: Awake/alert Behavior During Therapy: WFL for tasks assessed/performed Overall Cognitive Status: Impaired/Different from baseline Area of Impairment: Awareness;Problem solving                           Awareness: Emergent Problem Solving: Slow processing;Difficulty sequencing;Requires verbal cues;Requires tactile cues        General Comments General comments  (skin integrity, edema, etc.): Initiated AKA exercises and positioning    Exercises     Assessment/Plan    PT Assessment Patient needs continued PT services  PT Problem List Decreased strength;Decreased activity tolerance;Decreased balance;Decreased mobility;Decreased knowledge of use of DME       PT Treatment Interventions DME instruction;Gait training;Functional mobility training;Therapeutic activities;Therapeutic exercise;Balance training;Patient/family education    PT Goals (Current goals can be found in the Care Plan section)  Acute Rehab PT Goals Patient Stated Goal: get rehab ([place] vs [place]) before going to [employer]. living. PT Goal Formulation: With patient Time For Goal Achievement: 09/06/18 Potential to Achieve Goals: Good    Frequency Min 4X/week   Barriers to discharge        Co-evaluation PT/OT/SLP Co-Evaluation/Treatment: Yes Reason for Co-Treatment: For patient/therapist safety;To address functional/ADL transfers PT goals addressed during session: Mobility/safety with mobility OT goals addressed during session: Strengthening/ROM;ADL's and self-care       AM-PAC PT "6 Clicks" Mobility  Outcome Measure Help needed turning from your back to your side while in a flat bed without using bedrails?: A Lot Help needed moving from lying on your back to sitting on the side of a flat bed without using bedrails?: A Lot Help needed moving to and from a bed to a chair (including a wheelchair)?: A Lot Help needed standing up from a chair using your arms (e.g., wheelchair or bedside chair)?: A Lot Help needed to walk in hospital room?: Total Help needed climbing 3-5 steps with a railing? : Total 6 Click Score: 10    End of Session   Activity Tolerance: Patient tolerated treatment well Patient left: in chair;with call bell/phone within reach;with chair alarm set;with family/visitor present Nurse Communication: Mobility status PT Visit Diagnosis: Other abnormalities of  gait and mobility (R26.89);Muscle weakness (generalized) (M62.81);Difficulty in walking, not elsewhere classified (R26.2)    Time: 8333-8329 PT Time Calculation (min) (ACUTE ONLY): 60 min   Charges:   PT Evaluation $PT Eval Moderate Complexity: 1 Mod PT Treatments $Therapeutic Activity: 8-22 mins        08/23/2018  Donnella Sham, PT Acute Rehabilitation Services [phone]  (pager) [phone]  (office)  Tessie Fass Klinton Candelas 08/23/2018, 11:58 AM

## 2018-08-23 NOTE — Progress Notes (Addendum)
  Progress Note    08/23/2018 8:04 AM 1 Day Post-Op  Subjective:  Minimal pain L AKA   Vitals:   08/22/18 2000 08/23/18 0455  BP: (!) 130/47 (!) 119/46  Pulse: 60 60  Resp: 10 13  Temp: 98.6 F (37 C) 98.9 F (37.2 C)  SpO2: 96% 93%    Physical Exam: Incisions:  L AKA dressing left in place, no breakthrough bleeding   CBC    Component Value Date/Time   WBC 8.3 08/23/2018 0308   RBC 3.13 (L) 08/23/2018 0308   HGB 9.7 (L) 08/23/2018 0308   HGB 12.0 (L) 08/09/2018 1622   HCT 30.4 (L) 08/23/2018 0308   HCT 36.9 (L) 08/09/2018 1622   PLT 155 08/23/2018 0308   PLT 158 08/09/2018 1622   MCV 97.1 08/23/2018 0308   MCV 95 08/09/2018 1622   MCH 31.0 08/23/2018 0308   MCHC 31.9 08/23/2018 0308   RDW 14.0 08/23/2018 0308   RDW 14.0 08/09/2018 1622   LYMPHSABS 0.9 08/09/2018 1622   MONOABS 0.3 07/30/2016 0852   EOSABS 0.2 08/09/2018 1622   BASOSABS 0.0 08/09/2018 1622    BMET    Component Value Date/Time   NA 137 08/23/2018 0308   NA 144 08/09/2018 1622   K 4.8 08/23/2018 0308   CL 108 08/23/2018 0308   CO2 21 (L) 08/23/2018 0308   GLUCOSE 204 (H) 08/23/2018 0308   BUN 22 08/23/2018 0308   BUN 24 08/09/2018 1622   CREATININE 1.43 (H) 08/23/2018 0308   CALCIUM 7.8 (L) 08/23/2018 0308   GFRNONAA 42 (L) 08/23/2018 0308   GFRAA 49 (L) 08/23/2018 0308    INR    Component Value Date/Time   INR 1.1 08/22/2018 1315     Intake/Output Summary (Last 24 hours) at 08/23/2018 0804 Last data filed at 08/23/2018 0734 Gross per 24 hour  Intake 770.38 ml  Output 825 ml  Net -54.62 ml     Assessment/Plan:  85 y.o. male is s/p left above knee amputation  1 Day Post-Op  -Dressing change tomorrow -PT/OT to eval today -Pain meds on schedule today -Dispo: pending therapy recommendations   Dagoberto Ligas, PA-C [facility] [phone] 08/23/2018 8:04 AM  Agree with above.  Spoke with daughter and pt.  [facility] are [facility] [facility] [place].  Daughter will decide later today.  Should be ready for d/c tomorrow if bed available  Ruta Hinds, MD [facility] [place]: [phone] Pager: [phone]

## 2018-08-23 NOTE — Progress Notes (Signed)
Inpatient Rehabilitation Admissions Coordinator  Inpatient rehab consult received. I met with pt and his daughter, Evan [name], at bedside for rehab assessment. Pt living at [place] [facility] at [facility] for the past 3 years. In process of moving to apartment/independent living in the [place] area to be close to family. They are arranging 24/7 supervision with hired caregivers as well;l as family by the end of September. Patient would benefit form a slower paced rehab than CIR can offer to give the time to make this transition. I recommend SNF and we will sign off at this time. They are in agreement. I discussed with RN CM, Charmian Muff, RN, MSN Rehab Admissions Coordinator [phone] 08/23/2018 1:09 PM

## 2018-08-24 ENCOUNTER — Other Ambulatory Visit: Payer: Self-pay

## 2018-08-24 LAB — CBC
HCT: 27.7 % — ABNORMAL LOW (ref 39.0–52.0)
Hemoglobin: 9 g/dL — ABNORMAL LOW (ref 13.0–17.0)
MCH: 31.8 pg (ref 26.0–34.0)
MCHC: 32.5 g/dL (ref 30.0–36.0)
MCV: 97.9 fL (ref 80.0–100.0)
Platelets: 131 10*3/uL — ABNORMAL LOW (ref 150–400)
RBC: 2.83 MIL/uL — ABNORMAL LOW (ref 4.22–5.81)
RDW: 14.2 % (ref 11.5–15.5)
WBC: 6.4 10*3/uL (ref 4.0–10.5)
nRBC: 0 % (ref 0.0–0.2)

## 2018-08-24 LAB — BASIC METABOLIC PANEL
Anion gap: 8 (ref 5–15)
BUN: 25 mg/dL — ABNORMAL HIGH (ref 8–23)
CO2: 21 mmol/L — ABNORMAL LOW (ref 22–32)
Calcium: 7.6 mg/dL — ABNORMAL LOW (ref 8.9–10.3)
Chloride: 105 mmol/L (ref 98–111)
Creatinine, Ser: 1.64 mg/dL — ABNORMAL HIGH (ref 0.61–1.24)
GFR calc Af Amer: 41 mL/min — ABNORMAL LOW (ref >60)
GFR calc non Af Amer: 36 mL/min — ABNORMAL LOW (ref >60)
Glucose, Bld: 171 mg/dL — ABNORMAL HIGH (ref 70–99)
Potassium: 4.4 mmol/L (ref 3.5–5.1)
Sodium: 134 mmol/L — ABNORMAL LOW (ref 135–145)

## 2018-08-24 MED ORDER — ENOXAPARIN SODIUM 30 MG/0.3ML ~~LOC~~ SOLN
30.0000 mg | SUBCUTANEOUS | Status: DC
  Administered 2018-08-25 – 2018-09-02 (×9): 30 mg via SUBCUTANEOUS
  Filled 2018-08-24 (×9): qty 0.3

## 2018-08-24 NOTE — Progress Notes (Signed)
[facility] Progress Note Patient Details:  Evan [name] [birth year] [id number]  Patient ID: Evan [name], male   DOB: December 02, 1923, 85 y.o.   MRN: [id number]   Evan [name] 08/24/2018, 9:31 AMCalled [employer] for left AKA stump sock.

## 2018-08-24 NOTE — Progress Notes (Addendum)
  Progress Note    08/24/2018 8:19 AM 2 Days Post-Op  Subjective:  No complaints this morning   Vitals:   08/23/18 2106 08/24/18 0520  BP: (!) 123/48 (!) 140/51  Pulse: 60 60  Resp: 13   Temp: 98.6 F (37 C) 98.4 F (36.9 C)  SpO2: 98% 97%    Physical Exam: Incisions:  L AKA incision c/d/i   CBC    Component Value Date/Time   WBC 6.4 08/24/2018 0250   RBC 2.83 (L) 08/24/2018 0250   HGB 9.0 (L) 08/24/2018 0250   HGB 12.0 (L) 08/09/2018 1622   HCT 27.7 (L) 08/24/2018 0250   HCT 36.9 (L) 08/09/2018 1622   PLT 131 (L) 08/24/2018 0250   PLT 158 08/09/2018 1622   MCV 97.9 08/24/2018 0250   MCV 95 08/09/2018 1622   MCH 31.8 08/24/2018 0250   MCHC 32.5 08/24/2018 0250   RDW 14.2 08/24/2018 0250   RDW 14.0 08/09/2018 1622   LYMPHSABS 0.9 08/09/2018 1622   MONOABS 0.3 07/30/2016 0852   EOSABS 0.2 08/09/2018 1622   BASOSABS 0.0 08/09/2018 1622    BMET    Component Value Date/Time   NA 134 (L) 08/24/2018 0250   NA 144 08/09/2018 1622   K 4.4 08/24/2018 0250   CL 105 08/24/2018 0250   CO2 21 (L) 08/24/2018 0250   GLUCOSE 171 (H) 08/24/2018 0250   BUN 25 (H) 08/24/2018 0250   BUN 24 08/09/2018 1622   CREATININE 1.64 (H) 08/24/2018 0250   CALCIUM 7.6 (L) 08/24/2018 0250   GFRNONAA 36 (L) 08/24/2018 0250   GFRAA 41 (L) 08/24/2018 0250    INR    Component Value Date/Time   INR 1.1 08/22/2018 1315     Intake/Output Summary (Last 24 hours) at 08/24/2018 0819 Last data filed at 08/24/2018 0540 Gross per 24 hour  Intake 140 ml  Output 700 ml  Net -560 ml     Assessment/Plan:  85 y.o. male is s/p left above knee amputation  2 Days Post-Op  -Dressing changed today; stump sock ordered -Ok for discharge today; back to [place] if possible   Dagoberto Ligas, PA-C [facility] [phone] 08/24/2018 8:19 AM  Agree with above. Rehab planning ok for d/c from medical standpoint  Ruta Hinds, MD [facility]  of [place] Office: [phone] Pager: [phone]

## 2018-08-24 NOTE — Patient Outreach (Signed)
  [facility]) Care Management Chronic Special Needs Program   08/24/2018  Name: Evan [name], DOB: [birth year]  MRN: [id number]  The client was discussed in today's interdisciplinary care team meeting.  The following issues were discussed:  Client's needs, Changes in health status, Care Plan, Coordination of care and Care transitions  Participants present:  Bary Castilla, RNCM; Mahlon Gammon, [place]; Melisa Sandlin, RNCM; Thea Silversmith, RNCM; Kelli Churn, [facility]; Gilda Crease, PharmD, RPh, Dr. Coralie Carpen.  Plan: care coordination with hospital liaison as needed. RNCM will continue to follow as client's C-SNP care management coordinator.  Thea Silversmith, RN, MSN, [place] [phone]

## 2018-08-24 NOTE — TOC Progression Note (Signed)
Transition of Care [facility]) - Progression Note    Patient Details  Name: Evan [name] MRN: [id number] Date of Birth: September 21, 1923  Transition of Care [facility]) CM/SW [place], [place] Phone Number: 08/24/2018, 5:42 PM  Clinical Narrative:     CSW spoke with the patient's daughter, [name] to discuss discharge plan. She gave CSW permission to send referral to [facility], [facility] and [facility] - while she further discuss with her sister rehab in [place] verses [place] area. CSW reminded her the SNF must accept patient, insurance must be in network and transportation must be considered. CSW has been discussing discharge plan, options(rehab) and provided Medicare.gov SNF lisiting  sicne last week. CSW encourage her to discuss with family and return call to [place] as soon as possible with SNF choices.   Thurmond Butts, MSW, [facility] Clinical Social Worker [phone]    Expected Discharge Plan: Home/Self Care    Expected Discharge Plan and Services Expected Discharge Plan: Home/Self Care       Living arrangements for the past 2 months: [place] Expected Discharge Date: 08/20/18                                     Social Determinants of Health (SDOH) Interventions    Readmission Risk Interventions No flowsheet data found.

## 2018-08-24 NOTE — NC FL2 (Signed)
[place] LEVEL OF CARE SCREENING TOOL     IDENTIFICATION  Patient Name: Evan [name] Birthdate: 23-Jun-1923 Sex: male Admission Date (Current Location): 08/18/2018  [facility] [place] Number:  Herbalist and Address:  [facility] [place] [place] [address] [place], [place] [address]      Provider Number: [id number]  Attending Physician Name and Address:  Elam Dutch, MD  Relative Name and Phone Number:  Jushua Waltman [phone]    Current Level of Care: SNF Recommended Level of Care: [place] Prior Approval Number:    Date Approved/Denied:   PASRR Number: [id number]  Discharge Plan: SNF    Current Diagnoses: Patient Active Problem List   Diagnosis Date Noted  . PAD (peripheral artery disease) ([place]) 08/19/2018  . Type II diabetes mellitus, uncontrolled ([place]) 04/07/2018  . ACC/AHA stage B congestive heart failure due to ischemic cardiomyopathy ([place]) 04/07/2018  . Diabetic peripheral neuropathy ([place]) 04/07/2018  . Chronic back pain 03/16/2017  . Gastrointestinal hemorrhage 03/16/2017  . Increased frequency of urination 03/16/2017  . Kidney stone 03/16/2017  . Macular degeneration 03/16/2017  . Malignant neoplasm of skin 03/16/2017  . Mixed hyperlipidemia 03/16/2017  . Recurrent falls 03/16/2017  . Vertigo 03/16/2017  . Weakness of left leg 03/16/2017  . Myocardial infarction ([place]) 03/16/2017  . Coronary arteriosclerosis 03/16/2017  . Cardiac pacemaker in situ 03/16/2017  . Transient cerebral ischemia 03/16/2017  . Elevated troponin 01/13/2017  . Thrombocytopenia ([place]) 01/13/2017  . CKD (chronic kidney disease), stage III ([place]) 01/13/2017  . Benign prostatic hyperplasia 01/11/2017  . Thyroid disease 01/11/2017  . Chest pain 01/11/2017  . Symptomatic anemia 01/11/2017  . Demand ischemia ([place])   . Atrial fibrillation ([place]) 12/06/2016  . Fatigue 08/06/2016  . Change in consistency of stool  08/06/2016  . Pressure injury of skin 08/04/2016  . Congestive heart failure ([place]) 07/31/2016  . Atypical chest pain 07/30/2016  . Acute respiratory failure with hypoxia ([place]) 07/30/2016  . Acute respiratory failure with hypoxemia ([place])   . NSTEMI (non-ST elevated myocardial infarction) ([place]) 06/24/2016  . Rectal bleeding 06/23/2016  . Acute blood loss anemia 06/23/2016  . Patient had 2 or more falls in past year 01/20/2014  . Stage 3 chronic renal impairment associated with type 2 diabetes mellitus ([place]) 01/20/2014  . Acute on chronic systolic and diastolic heart failure, NYHA class 4 ([place]) 01/16/2014  . CAD S/P SVG-MO1/OM2 DES 12/11/13 12/13/2013  . Pacemaker implanted 12/12/13 (MDT) 12/13/2013  . Complete heart block ([place]) 12/13/2013  . HOH (hard of hearing) 12/13/2013  . 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 Sick sinus syndrome ([place]) 12/10/2013  . PAF (paroxysmal atrial fibrillation) ([place]) 12/10/2013  . Tachy-brady syndrome ([place]) 12/10/2013  . Symptomatic bradycardia 12/07/2013  . Heart block 03/22/2013  . CAD (coronary artery disease) of artery bypass graft 03/23/2012  . Angina pectoris- rate related 03/23/2012  . Carotid bruit 03/23/2012  . CARDIAC MURMUR 12/03/2009  . Type 2 diabetes with nephropathy ([place]) 12/02/2009  . HYPERCHOLESTEROLEMIA 12/02/2009  . Iron deficiency anemia 12/02/2009  . Essential hypertension 12/02/2009  . CABG '96 12/02/2009  . Allergic rhinitis 12/02/2009  . GERD (gastroesophageal reflux disease) 12/02/2009  . TRANSIENT ISCHEMIC ATTACKS, HX OF 12/02/2009  . NEPHROLITHIASIS, HX OF 12/02/2009    Orientation RESPIRATION BLADDER Height & Weight     Self, Time, Situation  Normal Continent Weight: 160 lb 4.8 oz (72.7 kg) Height:  5\' 7"  (170.2 cm)  BEHAVIORAL SYMPTOMS/MOOD NEUROLOGICAL BOWEL  NUTRITION STATUS      Continent Diet(please see discharge summary)  AMBULATORY STATUS COMMUNICATION OF NEEDS Skin     Verbally Surgical wounds(pressure injury coccyx  right,left stage I, incision(closed) leg,  wound incision(open/dehisced) venous stasis ulcer foot left)                       Personal Care Assistance Level of Assistance  Bathing, Feeding, Total care Bathing Assistance: Limited assistance Feeding assistance: Independent   Total Care Assistance: Limited assistance   Functional Limitations Info  Sight, Hearing, Speech Sight Info: Adequate Hearing Info: Impaired(uses hearing aids) Speech Info: Adequate    SPECIAL CARE FACTORS FREQUENCY  PT (By licensed PT), OT (By licensed OT)     PT Frequency: 3x per week OT Frequency: 3x per week            Contractures Contractures Info: Not present    Additional Factors Info  Code Status, Allergies Code Status Info: FULL Allergies Info: Clonidine,Codeine,Demerol,Meperidine ,Motrin,Penicillins,heparin,Metformin And Related           Current Medications (08/24/2018):  This is the current hospital active medication list Current Facility-Administered Medications  Medication Dose Route Frequency Provider Last Rate Last Dose  . 0.9 %  sodium chloride infusion  250 mL Intravenous PRN Elam Dutch, MD      . acetaminophen (TYLENOL) tablet 325-650 mg  325-650 mg Oral Q4H PRN Elam Dutch, MD       Or  . acetaminophen (TYLENOL) suppository 325-650 mg  325-650 mg Rectal Q4H PRN Elam Dutch, MD      . alum & mag hydroxide-simeth (MAALOX/MYLANTA) 200-200-20 MG/5ML suspension 15-30 mL  15-30 mL Oral Q2H PRN Elam Dutch, MD      . amiodarone (PACERONE) tablet 100 mg  100 mg Oral Daily Elam Dutch, MD   100 mg at 08/24/18 0844  . atorvastatin (LIPITOR) tablet 10 mg  10 mg Oral QPM Elam Dutch, MD   10 mg at 08/24/18 1735  . carvedilol (COREG) tablet 12.5 mg  12.5 mg Oral BID WC Elam Dutch, MD   12.5 mg at 08/24/18 0844  . clopidogrel (PLAVIX) tablet 75 mg  75 mg Oral Q breakfast Elam Dutch, MD   75 mg at 08/24/18 0844  . dextrose 5 %-0.45 %  sodium chloride infusion   Intravenous Continuous Elam Dutch, MD 50 mL/hr at 08/24/18 [phone]    . divalproex (DEPAKOTE ER) 24 hr tablet 250 mg  250 mg Oral QHS Elam Dutch, MD   250 mg at 08/23/18 2136  . docusate sodium (COLACE) capsule 100 mg  100 mg Oral Daily Elam Dutch, MD   100 mg at 08/24/18 0844  . [START ON 08/25/2018] enoxaparin (LOVENOX) injection 30 mg  30 mg Subcutaneous Q24H Skeet Simmer, [facility]      . ferrous sulfate tablet 325 mg  325 mg Oral Q breakfast Elam Dutch, MD   325 mg at 08/24/18 0843  . finasteride (PROSCAR) tablet 5 mg  5 mg Oral Daily Elam Dutch, MD   5 mg at 08/24/18 [phone]  . gabapentin (NEURONTIN) capsule 100 mg  100 mg Oral TID Elam Dutch, MD   100 mg at 08/24/18 1735  . guaiFENesin-dextromethorphan (ROBITUSSIN DM) 100-10 MG/5ML syrup 15 mL  15 mL Oral Q4H PRN Elam Dutch, MD      . hydrALAZINE (APRESOLINE) injection 5 mg  5 mg Intravenous Q20  Min PRN Elam Dutch, MD      . isosorbide mononitrate (IMDUR) 24 hr tablet 30 mg  30 mg Oral Daily Elam Dutch, MD   30 mg at 08/24/18 [phone]  . labetalol (NORMODYNE) injection 10 mg  10 mg Intravenous Q10 min PRN Elam Dutch, MD      . lactated ringers infusion   Intravenous Continuous Ellender, Karyl Kinnier, MD 10 mL/hr at 08/22/18 [phone]    . levothyroxine (SYNTHROID) tablet 112 mcg  112 mcg Oral Q0600 Elam Dutch, MD   112 mcg at 08/24/18 0524  . loperamide (IMODIUM) capsule 2-4 mg  2-4 mg Oral QID PRN Elam Dutch, MD      . magnesium sulfate IVPB 2 g 50 mL  2 g Intravenous Daily PRN Elam Dutch, MD      . Melatonin TABS 9 mg  9 mg Oral QHS PRN Elam Dutch, MD      . metoprolol tartrate (LOPRESSOR) injection 2-5 mg  2-5 mg Intravenous Q2H PRN Elam Dutch, MD      . morphine 2 MG/ML injection 2-5 mg  2-5 mg Intravenous Q1H PRN Elam Dutch, MD      . nitroGLYCERIN (NITROSTAT) SL tablet 0.4 mg  0.4 mg Sublingual Q5 min PRN Elam Dutch,  MD      . ondansetron [facility]) injection 4 mg  4 mg Intravenous Q6H PRN Elam Dutch, MD      . oxyCODONE (Oxy IR/ROXICODONE) immediate release tablet 5-10 mg  5-10 mg Oral Q4H PRN Elam Dutch, MD   10 mg at 08/24/18 1201  . pantoprazole (PROTONIX) EC tablet 40 mg  40 mg Oral Daily Elam Dutch, MD   40 mg at 08/24/18 0843  . phenol (CHLORASEPTIC) mouth spray 1 spray  1 spray Mouth/Throat PRN Elam Dutch, MD      . pneumococcal 13-valent conjugate vaccine (PREVNAR 13) injection 0.5 mL  0.5 mL Intramuscular Tomorrow-1000 Fields, Charles E, MD      . potassium chloride SA (K-DUR) CR tablet 20-40 mEq  20-40 mEq Oral Daily PRN Elam Dutch, MD      . saccharomyces boulardii (FLORASTOR) capsule 250 mg  250 mg Oral BID Elam Dutch, MD   250 mg at 08/24/18 0843  . sodium chloride flush (NS) 0.9 % injection 3 mL  3 mL Intravenous Q12H Elam Dutch, MD   3 mL at 08/23/18 2136  . sodium chloride flush (NS) 0.9 % injection 3 mL  3 mL Intravenous PRN Elam Dutch, MD      . traMADol Veatrice Bourbon) tablet 25 mg  25 mg Oral Q6H PRN Elam Dutch, MD   25 mg at 08/22/18 1645     Discharge Medications: Please see discharge summary for a list of discharge medications.  Relevant Imaging Results:  Relevant Lab Results:   Additional Information SSN [place] [id number] [place], [place]

## 2018-08-24 NOTE — Progress Notes (Signed)
Physical Therapy Treatment Patient Details Name: Evan [name] MRN: [id number] DOB: 05-25-1923 Today's Date: 08/24/2018    History of Present Illness pt is a 85 y/o male admitted for evaluation of left foot pain and ulceration with recent arterial study showing poor perfusion of that L leg.  Pt s/p attempted recanalization left popliteal artery without significant success.   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  PMHx:  arthritis, CAD, CHF, DM2, HTN, pAFIB, vertigo.    PT Comments    Upon entry today, pt stated his hearing aids weren't working well today.  This limited his session today, but visual and tactile cues given.  He gives forth effort with transfers.  At this time recommend SNF due to not being a candidate for CIR.   Follow Up Recommendations  SNF;Supervision/Assistance - 24 hour     Equipment Recommendations  None recommended by PT    Recommendations for Other Services       Precautions / Restrictions Precautions Precautions: Fall Restrictions Other Position/Activity Restrictions: L AKA, NWB    Mobility  Bed Mobility Overal bed mobility: Needs Assistance Bed Mobility: Supine to Sit     Supine to sit: Mod assist;Max assist     General bed mobility comments: Visual and tactile cues given for hand placement.  Bed pad used to assist in getting hips fully turned.  Transfers Overall transfer level: Needs assistance   Transfers: Squat Pivot Transfers     Squat pivot transfers: Mod assist;+2 safety/equipment     General transfer comment: Squat pivot to the R with MOD of 2,with pad assist.  He attempted to help re-position self in chair, but this caused more trunk extension and needed +@ to re-position.  Ambulation/Gait                 Stairs             Wheelchair Mobility    Modified Rankin (Stroke Patients Only)       Balance     Sitting balance-Leahy Scale: Fair       Standing balance-Leahy Scale: Poor                               Cognition Arousal/Alertness: Awake/alert Behavior During Therapy: WFL for tasks assessed/performed Overall Cognitive Status: No family/caregiver present to determine baseline cognitive functioning                                 General Comments: Limited by [facility]      Exercises      General Comments        Pertinent Vitals/Pain Pain Assessment: Faces Faces Pain Scale: Hurts a little bit Pain Location: L AKA with transfer Pain Descriptors / Indicators: Grimacing Pain Intervention(s): Limited activity within patient's tolerance;Monitored during session;Repositioned    Home Living                      Prior Function            PT Goals (current goals can now be found in the care plan section) Acute Rehab PT Goals Patient Stated Goal: get rehab ([place] vs [place]) before going to [facility]. living. Progress towards PT goals: Progressing toward goals    Frequency    Min 3X/week      PT Plan Discharge plan needs to be updated;Frequency needs to be updated(Pt  unable to go to CIR)    Co-evaluation              AM-PAC PT "6 Clicks" Mobility   Outcome Measure  Help needed turning from your back to your side while in a flat bed without using bedrails?: A Lot Help needed moving from lying on your back to sitting on the side of a flat bed without using bedrails?: A Lot Help needed moving to and from a bed to a chair (including a wheelchair)?: A Lot Help needed standing up from a chair using your arms (e.g., wheelchair or bedside chair)?: A Lot Help needed to walk in hospital room?: Total Help needed climbing 3-5 steps with a railing? : Total 6 Click Score: 10    End of Session Equipment Utilized During Treatment: Gait belt Activity Tolerance: Patient tolerated treatment well Patient left: in chair;with call bell/phone within reach;with chair alarm set Nurse Communication: Mobility status PT Visit Diagnosis: Other abnormalities of gait and  mobility (R26.89);Muscle weakness (generalized) (M62.81);Difficulty in walking, not elsewhere classified (R26.2)     Time: 5909-3112 PT Time Calculation (min) (ACUTE ONLY): 22 min  Charges:  $Therapeutic Activity: 8-22 mins                     Miciah Covelli L. Tamala Julian, [place] Pager [phone] 08/24/2018    Galen Manila 08/24/2018, 10:34 AM

## 2018-08-25 ENCOUNTER — Ambulatory Visit: Payer: HMO | Admitting: Pharmacist

## 2018-08-25 DIAGNOSIS — Z23 Encounter for immunization: Secondary | ICD-10-CM | POA: Diagnosis not present

## 2018-08-25 DIAGNOSIS — Z0001 Encounter for general adult medical examination with abnormal findings: Secondary | ICD-10-CM | POA: Diagnosis not present

## 2018-08-25 LAB — URINALYSIS, COMPLETE (UACMP) WITH MICROSCOPIC
Bilirubin Urine: NEGATIVE
Glucose, UA: NEGATIVE mg/dL
Ketones, ur: NEGATIVE mg/dL
Nitrite: NEGATIVE
Protein, ur: 30 mg/dL — AB
RBC / HPF: >50 RBC/hpf — ABNORMAL HIGH (ref 0–5)
Specific Gravity, Urine: 1.005 (ref 1.005–1.030)
pH: 6 (ref 5.0–8.0)

## 2018-08-25 LAB — CBC
HCT: 28.4 % — ABNORMAL LOW (ref 39.0–52.0)
Hemoglobin: 9 g/dL — ABNORMAL LOW (ref 13.0–17.0)
MCH: 31.5 pg (ref 26.0–34.0)
MCHC: 31.7 g/dL (ref 30.0–36.0)
MCV: 99.3 fL (ref 80.0–100.0)
Platelets: 144 10*3/uL — ABNORMAL LOW (ref 150–400)
RBC: 2.86 MIL/uL — ABNORMAL LOW (ref 4.22–5.81)
RDW: 13.9 % (ref 11.5–15.5)
WBC: 5.8 10*3/uL (ref 4.0–10.5)
nRBC: 0 % (ref 0.0–0.2)

## 2018-08-25 LAB — BASIC METABOLIC PANEL
Anion gap: 8 (ref 5–15)
BUN: 24 mg/dL — ABNORMAL HIGH (ref 8–23)
CO2: 20 mmol/L — ABNORMAL LOW (ref 22–32)
Calcium: 7.4 mg/dL — ABNORMAL LOW (ref 8.9–10.3)
Chloride: 107 mmol/L (ref 98–111)
Creatinine, Ser: 1.65 mg/dL — ABNORMAL HIGH (ref 0.61–1.24)
GFR calc Af Amer: 41 mL/min — ABNORMAL LOW (ref >60)
GFR calc non Af Amer: 36 mL/min — ABNORMAL LOW (ref >60)
Glucose, Bld: 174 mg/dL — ABNORMAL HIGH (ref 70–99)
Potassium: 4.5 mmol/L (ref 3.5–5.1)
Sodium: 135 mmol/L (ref 135–145)

## 2018-08-25 MED ORDER — CIPROFLOXACIN HCL 250 MG PO TABS
250.0000 mg | ORAL_TABLET | Freq: Every day | ORAL | Status: DC
  Administered 2018-08-25 – 2018-08-27 (×3): 250 mg via ORAL
  Filled 2018-08-25 (×3): qty 1

## 2018-08-25 NOTE — Progress Notes (Addendum)
I got report from day shift RN, Brook, that Pt appeared drowsy after last dose of Oxycodone around 12 o'clock noon time. Pt's daughter named [name] stated her father has never been like this before. He has been sleeping almost all day today and does not interact with her like usual. Dr. Donnetta Hutching made aware by RN day shift, Brook.   When I approached Pt, he was very sleepy, easily to arouse and awake, confused, he's only oriented to self.  However, his vital signs remained stable, and no acute distress noted. EKG AV paced on monitor, HR 60, RR 12-16. BP 139/50- 140/47 mmHg, SPO2 room air 96-99%  Day shift noted Pt urine output was 200 ml since 0800 am  And little to no appetite due to drowsiness. D5% in 0.45% NSS infusing at 50 ml/hr continuously.  Bladder scan was done tonight at 1200 am showed >760 ml of urine retention. Encouraged Pt to urinate, he was able to void only 100 ml. Intermittent straight cath performed with some difficulty due to swollen penis. Got urine out 675 ml post void with yellow and clear urine with small blood clot.   Continue to monitor.  Kennyth Lose, RN

## 2018-08-25 NOTE — Progress Notes (Addendum)
[facility] [place]  Subjective  - pt lethargic and confused this morning   Objective (!) 139/45 60 98.2 F (36.8 C) (Oral) (!) 9 100%  Intake/Output Summary (Last 24 hours) at 08/25/2018 0802 Last data filed at 08/25/2018 0500 Gross per 24 hour  Intake 510 ml  Output 1650 ml  Net -1140 ml   Left AKA no erythema some serous drainage from incision with edema  Assessment/Planning: More confused this morning Will d/c all narcotics and neurontin Stop IV fluid for now as he is already edematous Check stat CBC to check WBC and hgb to rule out infection or anemia Check UA to rule out UTI Urinary retention yesterday.  Insert foley today if he has high retention > 300 Will need to delay d/c until mental status a little improved   Ruta Hinds 08/25/2018 8:02 AM -- Daughter updated by phone 11 am today  Ruta Hinds, MD [facility] [place] Office: [phone] Pager: [phone]  Laboratory Lab Results: Recent Labs    08/23/18 0308 08/24/18 0250  WBC 8.3 6.4  HGB 9.7* 9.0*  HCT 30.4* 27.7*  PLT 155 131*   BMET Recent Labs    08/23/18 0308 08/24/18 0250  NA 137 134*  K 4.8 4.4  CL 108 105  CO2 21* 21*  GLUCOSE 204* 171*  BUN 22 25*  CREATININE 1.43* 1.64*  CALCIUM 7.8* 7.6*    COAG Lab Results  Component Value Date   INR 1.1 08/22/2018   INR 1.20 01/12/2017   INR 1.08 06/24/2016   No results found for: PTT

## 2018-08-25 NOTE — Progress Notes (Signed)
Patient has been passing blood clots with urine all day. MD Fields made aware. States its ok as long as the patient is actively voiding. Patient UA positive for bacteria. Started on Cipro this evening. Patient exhibits no s/s of discomfort or acute distress. Nursing will continue to monitor.

## 2018-08-25 NOTE — Progress Notes (Signed)
PHARMACY NOTE:  ANTIMICROBIAL RENAL DOSAGE ADJUSTMENT  Current antimicrobial regimen includes a mismatch between antimicrobial dosage and estimated renal function.  As per policy approved by the Pharmacy & Therapeutics and Medical Executive Committees, the antimicrobial dosage will be adjusted accordingly.  Current antimicrobial dosage:  Cipro 500 mg PO daily x 7 days   Indication: UTI  Renal Function:  Estimated Creatinine Clearance: 26.7 mL/min (A) (by C-G formula based on SCr of 1.65 mg/dL (H)). []      On intermittent HD, scheduled: []      On CRRT    Antimicrobial dosage has been changed to:  Cipro 250 mg PO daily x 7 days  Additional comments:  Urine culture sent. Scheduled daily dose for 6pm to avoid daily iron with breakfast.  Penicillin allergy, but has tolerated Cefazolin in the past.  Could consider changing to Cephalexin 250 mg PO BID.   Thank you for allowing pharmacy to be a part of this patient's care.  Arty Baumgartner, [facility]  Pager: [phone] 08/25/2018 4:19 PM

## 2018-08-25 NOTE — Progress Notes (Signed)
UA suggestive of UTI creatinine stable Will start Cipro 7 day course  Ruta Hinds, MD [facility] [place] Office: [phone] Pager: [phone]

## 2018-08-26 ENCOUNTER — Encounter (HOSPITAL_COMMUNITY): Payer: Self-pay | Admitting: *Deleted

## 2018-08-26 LAB — BASIC METABOLIC PANEL
Anion gap: 9 (ref 5–15)
BUN: 26 mg/dL — ABNORMAL HIGH (ref 8–23)
CO2: 20 mmol/L — ABNORMAL LOW (ref 22–32)
Calcium: 7.9 mg/dL — ABNORMAL LOW (ref 8.9–10.3)
Chloride: 108 mmol/L (ref 98–111)
Creatinine, Ser: 1.64 mg/dL — ABNORMAL HIGH (ref 0.61–1.24)
GFR calc Af Amer: 41 mL/min — ABNORMAL LOW (ref >60)
GFR calc non Af Amer: 36 mL/min — ABNORMAL LOW (ref >60)
Glucose, Bld: 181 mg/dL — ABNORMAL HIGH (ref 70–99)
Potassium: 5 mmol/L (ref 3.5–5.1)
Sodium: 137 mmol/L (ref 135–145)

## 2018-08-26 LAB — URINE CULTURE: Culture: NO GROWTH

## 2018-08-26 LAB — CBC
HCT: 28.7 % — ABNORMAL LOW (ref 39.0–52.0)
Hemoglobin: 9.3 g/dL — ABNORMAL LOW (ref 13.0–17.0)
MCH: 31.2 pg (ref 26.0–34.0)
MCHC: 32.4 g/dL (ref 30.0–36.0)
MCV: 96.3 fL (ref 80.0–100.0)
Platelets: 146 10*3/uL — ABNORMAL LOW (ref 150–400)
RBC: 2.98 MIL/uL — ABNORMAL LOW (ref 4.22–5.81)
RDW: 13.9 % (ref 11.5–15.5)
WBC: 5.8 10*3/uL (ref 4.0–10.5)
nRBC: 0 % (ref 0.0–0.2)

## 2018-08-26 NOTE — Progress Notes (Signed)
Patient unable to void. Bladder scan and got greater than 336 in bladder. Will Page M.D. on call.

## 2018-08-26 NOTE — Progress Notes (Addendum)
  Progress Note    08/26/2018 10:42 AM 4 Days Post-Op  Subjective: More alert this morning; no pain left AKA   Vitals:   08/26/18 0558 08/26/18 0612  BP: (!) 165/59 (!) 148/51  Pulse: 62   Resp: 19 17  Temp:    SpO2:     Physical Exam: Lungs: Nonlabored Incisions: Left AKA skin edges viable, some serous ooze mid incision however no areas of fluctuance or purulent drainage, no surrounding erythema, moderate edema left AKA stump Ext: Dressings left in place right lower leg Neurologic: Alert and oriented  CBC    Component Value Date/Time   WBC 5.8 08/26/2018 0810   RBC 2.98 (L) 08/26/2018 0810   HGB 9.3 (L) 08/26/2018 0810   HGB 12.0 (L) 08/09/2018 1622   HCT 28.7 (L) 08/26/2018 0810   HCT 36.9 (L) 08/09/2018 1622   PLT 146 (L) 08/26/2018 0810   PLT 158 08/09/2018 1622   MCV 96.3 08/26/2018 0810   MCV 95 08/09/2018 1622   MCH 31.2 08/26/2018 0810   MCHC 32.4 08/26/2018 0810   RDW 13.9 08/26/2018 0810   RDW 14.0 08/09/2018 1622   LYMPHSABS 0.9 08/09/2018 1622   MONOABS 0.3 07/30/2016 0852   EOSABS 0.2 08/09/2018 1622   BASOSABS 0.0 08/09/2018 1622    BMET    Component Value Date/Time   NA 137 08/26/2018 0810   NA 144 08/09/2018 1622   K 5.0 08/26/2018 0810   CL 108 08/26/2018 0810   CO2 20 (L) 08/26/2018 0810   GLUCOSE 181 (H) 08/26/2018 0810   BUN 26 (H) 08/26/2018 0810   BUN 24 08/09/2018 1622   CREATININE 1.64 (H) 08/26/2018 0810   CALCIUM 7.9 (L) 08/26/2018 0810   GFRNONAA 36 (L) 08/26/2018 0810   GFRAA 41 (L) 08/26/2018 0810    INR    Component Value Date/Time   INR 1.1 08/22/2018 1315     Intake/Output Summary (Last 24 hours) at 08/26/2018 1042 Last data filed at 08/26/2018 0820 Gross per 24 hour  Intake 840 ml  Output 1275 ml  Net -435 ml     Assessment/Plan:  85 y.o. male is s/p left AKA 4 Days Post-Op   -Minimal serous collection on stump sock; can switch back to Ace wrap if edema of left AKA stump persists or if drainage increases  -More alert this morning; continue Cipro for a 7-day course -Urinary retention: We will have another void trial closer to discharge -Patient will likely be here over the weekend and will reassess for discharge to skilled nursing facility next week   Dagoberto Ligas, PA-C [facility] [phone] 08/26/2018 10:42 AM  Agree with above. Biggest limitation to d/c at this time is Rehab in [place] And urinary retention Try to get foley out in 24 hr Cipro for 7 days  Ruta Hinds, MD [facility] [place]: [phone] Pager: [phone]

## 2018-08-26 NOTE — Progress Notes (Signed)
Dressing on RLE changed per [place] instructions.  Pt tolerated well.  Will con't plan of care.

## 2018-08-26 NOTE — Progress Notes (Signed)
Dr. Donzetta Matters return call see order for Foley to be inserted. Insert F 14 got 275 ml red urine. Patient tol. Well.

## 2018-08-26 NOTE — Progress Notes (Signed)
Occupational Therapy Treatment Patient Details Name: Evan [name] MRN: [id number] DOB: 1923-02-17 Today's Date: 08/26/2018    History of present illness pt is a 85 y/o male admitted for evaluation of left foot pain and ulceration with recent arterial study showing poor perfusion of that L leg.  Pt s/p attempted recanalization left popliteal artery without significant success.   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  PMHx:  arthritis, CAD, CHF, DM2, HTN, pAFIB, vertigo.   OT comments  Pt progressing toward established goals. Pt currently requires setupA for grooming ADL sitting EOB with minguard to supervision for stability. Pt requires modA+2-maxA+2 for transfer from EOB to recliner to the right side. Recommend SNF level therapy at d/c as pt is not appropriate for CIR level therapies and would benefit from a slower paced rehab plan. Pt will continue to benefit from skilled OT services to maximize safety and independence with ADL/IADL and functional mobility. Will continue to follow acutely and progress as tolerated.      Follow Up Recommendations  SNF    Equipment Recommendations  3 in 1 bedside commode    Recommendations for Other Services      Precautions / Restrictions Precautions Precautions: Fall Restrictions Weight Bearing Restrictions: Yes RLE Weight Bearing: Weight bearing as tolerated LLE Weight Bearing: Non weight bearing Other Position/Activity Restrictions: L AKA, NWB       Mobility Bed Mobility Overal bed mobility: Needs Assistance Bed Mobility: Supine to Sit     Supine to sit: Max assist;HOB elevated     General bed mobility comments: multimodal cues for hand placement and sequencing;maxA to progressing hips to EOB;  Transfers Overall transfer level: Needs assistance   Transfers: Squat Pivot Transfers;Sit to/from Stand Sit to Stand: Mod assist;+2 physical assistance;Max assist   Squat pivot transfers: Mod assist;+2 safety/equipment;Max assist     General transfer  comment: required max vc to engage and use RLE during transfer, pt appeared to primarily use BUE    Balance Overall balance assessment: Needs assistance Sitting-balance support: Single extremity supported;No upper extremity supported Sitting balance-Leahy Scale: Poor Sitting balance - Comments: intermittent min guard, overall close supervision    Standing balance support: Bilateral upper extremity supported;During functional activity Standing balance-Leahy Scale: Zero Standing balance comment: unable to reach upright standing position                           ADL either performed or assessed with clinical judgement   ADL Overall ADL's : Needs assistance/impaired     Grooming: Set up;Sitting Grooming Details (indicate cue type and reason): demonstrated increased effort to don hearing aids             Lower Body Dressing: Total assistance;+2 for physical assistance;Sit to/from stand   Toilet Transfer: Moderate assistance;+2 for physical assistance;Squat-pivot Toilet Transfer Details (indicate cue type and reason): simulated to reclienr          Functional mobility during ADLs: Moderate assistance;+2 for physical assistance       Vision       Perception     Praxis      Cognition Arousal/Alertness: Awake/alert Behavior During Therapy: WFL for tasks assessed/performed Overall Cognitive Status: No family/caregiver present to determine baseline cognitive functioning Area of Impairment: Awareness;Problem solving                           Awareness: Emergent Problem Solving: Slow processing;Difficulty sequencing;Requires verbal cues;Requires tactile  cues General Comments: Limited by [facility]        Exercises     Shoulder Instructions       General Comments VSS throughout;edcuated RN on importance of wrapping/using shaper for residual limb in preparation for prosthetic should pt go down that avenue;RN communicated pt's daughter took shaper home  to wash it, plans to don when it returns    Pertinent Vitals/ Pain       Pain Assessment: Faces Faces Pain Scale: Hurts a little bit Pain Location: L AKA with transfer Pain Descriptors / Indicators: Grimacing Pain Intervention(s): Limited activity within patient's tolerance;Monitored during session  Home Living                                          Prior Functioning/Environment              Frequency  Min 2X/week        Progress Toward Goals  OT Goals(current goals can now be found in the care plan section)  Progress towards OT goals: Progressing toward goals  Acute Rehab OT Goals Patient Stated Goal: go home with family;get to the chair more easily OT Goal Formulation: With patient Time For Goal Achievement: 09/06/18 Potential to Achieve Goals: Good ADL Goals Pt Will Perform Grooming: with modified independence;sitting Pt Will Perform Lower Body Dressing: sit to/from stand;with modified independence;with adaptive equipment Pt Will Transfer to Toilet: with modified independence;ambulating;bedside commode Pt Will Perform Toileting - Clothing Manipulation and hygiene: with modified independence;sit to/from stand;sitting/lateral leans Pt/caregiver will Perform Home Exercise Program: Increased strength;Both right and left upper extremity;With written HEP provided;With theraband  Plan Discharge plan needs to be updated    Co-evaluation    PT/OT/SLP Co-Evaluation/Treatment: Yes Reason for Co-Treatment: For patient/therapist safety;To address functional/ADL transfers   OT goals addressed during session: ADL's and self-care      AM-PAC OT "6 Clicks" Daily Activity     Outcome Measure   Help from another person eating meals?: None Help from another person taking care of personal grooming?: A Little Help from another person toileting, which includes using toliet, bedpan, or urinal?: Total Help from another person bathing (including washing,  rinsing, drying)?: A Lot Help from another person to put on and taking off regular upper body clothing?: A Little Help from another person to put on and taking off regular lower body clothing?: Total 6 Click Score: 14    End of Session Equipment Utilized During Treatment: Gait belt;Rolling walker  OT Visit Diagnosis: Other abnormalities of gait and mobility (R26.89);Muscle weakness (generalized) (M62.81)   Activity Tolerance Patient tolerated treatment well   Patient Left in chair;with call bell/phone within reach;with chair alarm set;with family/visitor present   Nurse Communication Mobility status        Time: 0936-1006 OT Time Calculation (min): 30 min  Charges: OT General Charges $OT Visit: 1 Visit OT Treatments $Self Care/Home Management : 8-22 mins  Dorinda Hill OTR/L Acute Rehabilitation Services Office: [name] 08/26/2018, 10:23 AM

## 2018-08-26 NOTE — TOC Progression Note (Signed)
Transition of Care [facility]) - Progression Note    Patient Details  Name: Evan [name] MRN: [id number] Date of Birth: [birth year]  Transition of [facility]) CM/SW [place], [place] Phone Number: 08/26/2018, 11:49 AM  Clinical Narrative:     Patient's daughter states patient new PCP is : [facility] located at [place] [address].   Family is looking for SNF in the [place] area- Patient's daughter, [name], contacted [facility] for possible placement- CSW received call from their admissions coordinator Ahana [phone]  Fax # [phone]. CSW faxed clinicals for review. If approved for placement , patient will need Covid test with in 48 hrs prior to discharge, insurance approval and discharge summary at dischargel. Family can transport to their facility if medically safe for the patient.   CSW will continue to follow and assist with discharge planning.   Thurmond Butts, MSW, [facility] Clinical Social Worker [phone]   Expected Discharge Plan: Home/Self Care    Expected Discharge Plan and Services Expected Discharge Plan: Home/Self Care       Living arrangements for the past 2 months: [place] Expected Discharge Date: 08/20/18                                     Social Determinants of Health (SDOH) Interventions    Readmission Risk Interventions No flowsheet data found.

## 2018-08-26 NOTE — Progress Notes (Signed)
Physical Therapy Treatment Patient Details Name: Evan [name] MRN: [id number] DOB: 1923-01-23 Today's Date: 08/26/2018    History of Present Illness pt is a 85 y/o male admitted for evaluation of left foot pain and ulceration with recent arterial study showing poor perfusion of that L leg.  Pt s/p attempted recanalization left popliteal artery without significant success.   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  PMHx:  arthritis, CAD, CHF, DM2, HTN, pAFIB, vertigo.    PT Comments    Patient received in bed, sleeping upon arrival. Very HOH. Patient attempted to insert hearing aides without success. Patient requires max assist for supine>sit and scooting in bed. Requires +2 max assist for sit to stand transfer, unable to achieve standing with RW. +2 max assist squat pivot transfer to recliner.  Patient requires tactile and verbal cues for using R LE to assist with transfers. Patient will benefit from continued skilled PT to address his functional mobility limitations, and weakness.   Follow Up Recommendations  SNF;Supervision/Assistance - 24 hour     Equipment Recommendations  None recommended by PT    Recommendations for Other Services       Precautions / Restrictions Precautions Precautions: Fall Restrictions Weight Bearing Restrictions: Yes RLE Weight Bearing: Weight bearing as tolerated LLE Weight Bearing: Non weight bearing Other Position/Activity Restrictions: L AKA, NWB    Mobility  Bed Mobility Overal bed mobility: Needs Assistance Bed Mobility: Supine to Sit     Supine to sit: Max assist;HOB elevated     General bed mobility comments: multimodal cues for hand placement and sequencing;maxA to progressing hips to EOB;  Transfers Overall transfer level: Needs assistance Equipment used: Rolling walker (2 wheeled) Transfers: Sit to/from [employer] Sit to Stand: +2 physical assistance;Max assist   Squat pivot transfers: +2 physical assistance;Max assist      General transfer comment: required max vc to engage and use RLE during transfer, pt appeared to primarily use BUE  Ambulation/Gait             General Gait Details: unable   Stairs             Wheelchair Mobility    Modified Rankin (Stroke Patients Only)       Balance Overall balance assessment: Needs assistance Sitting-balance support: Single extremity supported;Feet supported Sitting balance-Leahy Scale: Fair Sitting balance - Comments: intermittent min guard, overall close supervision    Standing balance support: Bilateral upper extremity supported;During functional activity Standing balance-Leahy Scale: Zero Standing balance comment: unable to reach upright standing position despite verbal and tactile cues                            Cognition Arousal/Alertness: Awake/alert Behavior During Therapy: WFL for tasks assessed/performed Overall Cognitive Status: No family/caregiver present to determine baseline cognitive functioning Area of Impairment: Awareness;Problem solving;Safety/judgement                         Safety/Judgement: Decreased awareness of safety Awareness: Emergent Problem Solving: Slow processing;Difficulty sequencing;Requires verbal cues;Requires tactile cues General Comments: Limited by [facility]      Exercises      General Comments General comments (skin integrity, edema, etc.): discussed with RN importance of wrapping residual limb for potential prosthetic use. RN verbalized understanding. Also messaged RN that incision is still draining and may need something between sleeve and incision.      Pertinent Vitals/Pain Pain Assessment: Faces Faces Pain  Scale: Hurts a little bit Pain Location: L AKA with transfer Pain Descriptors / Indicators: Grimacing Pain Intervention(s): Monitored during session;Repositioned    Home Living                      Prior Function            PT Goals (current goals can  now be found in the care plan section) Acute Rehab PT Goals Patient Stated Goal: to go to rehab PT Goal Formulation: With patient/family Time For Goal Achievement: 09/06/18 Potential to Achieve Goals: Good Progress towards PT goals: Progressing toward goals    Frequency    Min 3X/week      PT Plan Current plan remains appropriate    Co-evaluation PT/OT/SLP Co-Evaluation/Treatment: Yes Reason for Co-Treatment: For patient/therapist safety;To address functional/ADL transfers PT goals addressed during session: Mobility/safety with mobility OT goals addressed during session: ADL's and self-care      AM-PAC PT "6 Clicks" Mobility   Outcome Measure  Help needed turning from your back to your side while in a flat bed without using bedrails?: Total Help needed moving from lying on your back to sitting on the side of a flat bed without using bedrails?: A Lot Help needed moving to and from a bed to a chair (including a wheelchair)?: Total Help needed standing up from a chair using your arms (e.g., wheelchair or bedside chair)?: A Lot Help needed to walk in hospital room?: Total Help needed climbing 3-5 steps with a railing? : Total 6 Click Score: 8    End of Session Equipment Utilized During Treatment: Gait belt Activity Tolerance: Patient limited by fatigue Patient left: in chair;with call bell/phone within reach;with chair alarm set Nurse Communication: Mobility status;Other (comment)(Residual limb wrapping) PT Visit Diagnosis: Unsteadiness on feet (R26.81);Muscle weakness (generalized) (M62.81);Other abnormalities of gait and mobility (R26.89);Difficulty in walking, not elsewhere classified (R26.2)     Time: [id number] PT Time Calculation (min) (ACUTE ONLY): 30 min  Charges:  $Therapeutic Activity: 23-37 mins                     [employer], PT, [employer] 08/26/18,12:27 PM

## 2018-08-27 LAB — CBC
HCT: 29 % — ABNORMAL LOW (ref 39.0–52.0)
Hemoglobin: 9.2 g/dL — ABNORMAL LOW (ref 13.0–17.0)
MCH: 31.2 pg (ref 26.0–34.0)
MCHC: 31.7 g/dL (ref 30.0–36.0)
MCV: 98.3 fL (ref 80.0–100.0)
Platelets: 176 10*3/uL (ref 150–400)
RBC: 2.95 MIL/uL — ABNORMAL LOW (ref 4.22–5.81)
RDW: 14 % (ref 11.5–15.5)
WBC: 6.7 10*3/uL (ref 4.0–10.5)
nRBC: 0 % (ref 0.0–0.2)

## 2018-08-27 LAB — BASIC METABOLIC PANEL
Anion gap: 13 (ref 5–15)
BUN: 27 mg/dL — ABNORMAL HIGH (ref 8–23)
CO2: 17 mmol/L — ABNORMAL LOW (ref 22–32)
Calcium: 7.9 mg/dL — ABNORMAL LOW (ref 8.9–10.3)
Chloride: 107 mmol/L (ref 98–111)
Creatinine, Ser: 1.65 mg/dL — ABNORMAL HIGH (ref 0.61–1.24)
GFR calc Af Amer: 41 mL/min — ABNORMAL LOW (ref >60)
GFR calc non Af Amer: 36 mL/min — ABNORMAL LOW (ref >60)
Glucose, Bld: 222 mg/dL — ABNORMAL HIGH (ref 70–99)
Potassium: 4.8 mmol/L (ref 3.5–5.1)
Sodium: 137 mmol/L (ref 135–145)

## 2018-08-27 NOTE — Progress Notes (Addendum)
  Progress Note    08/27/2018 9:03 AM 5 Days Post-Op  Subjective:  No new complaints   Vitals:   08/26/18 1951 08/27/18 0402  BP: (!) 164/59 (!) 165/57  Pulse: 61 (!) 58  Resp: 17   Temp: 98.8 F (37.1 C) 98.2 F (36.8 C)  SpO2: 99% 96%   Physical Exam: Lungs:  Non labored Incisions:  L AKA incision healing well; minimal serous ooze from mid incision Extremities:  RLE dressing left in place Neurologic: A&O  CBC    Component Value Date/Time   WBC 6.7 08/27/2018 0304   RBC 2.95 (L) 08/27/2018 0304   HGB 9.2 (L) 08/27/2018 0304   HGB 12.0 (L) 08/09/2018 1622   HCT 29.0 (L) 08/27/2018 0304   HCT 36.9 (L) 08/09/2018 1622   PLT 176 08/27/2018 0304   PLT 158 08/09/2018 1622   MCV 98.3 08/27/2018 0304   MCV 95 08/09/2018 1622   MCH 31.2 08/27/2018 0304   MCHC 31.7 08/27/2018 0304   RDW 14.0 08/27/2018 0304   RDW 14.0 08/09/2018 1622   LYMPHSABS 0.9 08/09/2018 1622   MONOABS 0.3 07/30/2016 0852   EOSABS 0.2 08/09/2018 1622   BASOSABS 0.0 08/09/2018 1622    BMET    Component Value Date/Time   NA 137 08/27/2018 0304   NA 144 08/09/2018 1622   K 4.8 08/27/2018 0304   CL 107 08/27/2018 0304   CO2 17 (L) 08/27/2018 0304   GLUCOSE 222 (H) 08/27/2018 0304   BUN 27 (H) 08/27/2018 0304   BUN 24 08/09/2018 1622   CREATININE 1.65 (H) 08/27/2018 0304   CALCIUM 7.9 (L) 08/27/2018 0304   GFRNONAA 36 (L) 08/27/2018 0304   GFRAA 41 (L) 08/27/2018 0304    INR    Component Value Date/Time   INR 1.1 08/22/2018 1315     Intake/Output Summary (Last 24 hours) at 08/27/2018 0903 Last data filed at 08/27/2018 0402 Gross per 24 hour  Intake 240 ml  Output 501 ml  Net -261 ml     Assessment/Plan:  85 y.o. male is s/p L AKA 5 Days Post-Op   L AKA healing well Continue wound care RLE SNF placement pending  Dagoberto Ligas, PA-C [facility] [phone] 08/27/2018 9:03 AM  I have seen and evaluated the patient. I agree with the PA note as  documented above. S/P L AKA. Healing.  Will need voiding trial when closer to discharge.  Awaiting SNF placement.  Marty Heck, MD [facility] [place] Office: [phone] Pager: [phone]

## 2018-08-27 NOTE — Progress Notes (Signed)
Pt called out to nurses station. This RN answered with no answer. Daughter came to nurses station stated pt was aggravated and was saying something regarding bed pan. This RN to room asked neuro intact pt if he needed anything. Pt stated no and also stated he was comfortable. Daughter at beside. Will continue to monitor.

## 2018-08-27 NOTE — Progress Notes (Signed)
Physical Therapy Treatment Patient Details Name: Evan [name] MRN: [id number] DOB: [birth year] Today's Date: 08/27/2018    History of Present Illness pt is a 85 y/o male admitted for evaluation of left foot pain and ulceration with recent arterial study showing poor perfusion of that L leg.  Pt s/p attempted recanalization left popliteal artery without significant success.   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  PMHx:  arthritis, CAD, CHF, DM2, HTN, pAFIB, vertigo.    PT Comments    Daughter wants pt to have therapy and get stronger. She is upset that he seems to be getting weaker.  I tried to work with him today and he was lethargic and unable to stay awake to participate.  All vitals stable.  I did some education with daughter - on things she can do with him to help him when he is more alert.  I agree with SNF placement as plan of action.   Follow Up Recommendations  SNF;Supervision/Assistance - 24 hour     Equipment Recommendations  None recommended by PT    Recommendations for Other Services       Precautions / Restrictions Precautions Precautions: Fall Precaution Comments: left residual limb - no drainage Restrictions Weight Bearing Restrictions: Yes RLE Weight Bearing: Weight bearing as tolerated LLE Weight Bearing: Non weight bearing Other Position/Activity Restrictions: L AKA, NWB    Mobility  Bed Mobility                  Transfers                 General transfer comment: pts daughter wants  him to particpate in therapy.  she is upset that he seems to be getting weaker instead of stronger.  today he wouldnt wake up for therapy - even after several attempts.  I had pt do some activites in the chair - scooting foward and backward.  trying to get him to use his UEs more.  pt would start the activitya nd then fall asleep.  educated daughter on how to him do this  Ambulation/Gait             General Gait Details: unable   Stairs             Wheelchair  Mobility    Modified Rankin (Stroke Patients Only)       Balance       Sitting balance - Comments: having pt sit up in chair - scooting foward to help get his foot on the floor adn more active trunk.  pt kept falling asleep doing this - minimal participation                                    Cognition Arousal/Alertness: Lethargic   Overall Cognitive Status: Impaired/Different from baseline                                 General Comments: pt asleep today - he would wake up and answer questions but then back to sleep.  Even with more activity he didnt wake up.  he would jerk when sleeping      Exercises      General Comments General comments (skin integrity, edema, etc.): Showed the daughter h ow to help pt with sitting ROM exercises - esp right heelcord stretching - pt getting tight in  heelcord - this will affect his standing.      Pertinent Vitals/Pain Pain Assessment: Faces Faces Pain Scale: No hurt Pain Intervention(s): Monitored during session    Home Living                      Prior Function            PT Goals (current goals can now be found in the care plan section) Progress towards PT goals: Not progressing toward goals - comment(pt too lethargic for participation today - focused on daughter education)    Frequency    Min 3X/week      PT Plan Current plan remains appropriate    Co-evaluation              AM-PAC PT "6 Clicks" Mobility   Outcome Measure  Help needed turning from your back to your side while in a flat bed without using bedrails?: A Lot Help needed moving from lying on your back to sitting on the side of a flat bed without using bedrails?: A Lot Help needed moving to and from a bed to a chair (including a wheelchair)?: Total Help needed standing up from a chair using your arms (e.g., wheelchair or bedside chair)?: Total Help needed to walk in hospital room?: Total Help needed climbing  3-5 steps with a railing? : Total 6 Click Score: 8    End of Session   Activity Tolerance: Patient limited by lethargy Patient left: in chair;with family/visitor present;with call bell/phone within reach Nurse Communication: Mobility status PT Visit Diagnosis: Unsteadiness on feet (R26.81);Muscle weakness (generalized) (M62.81);Other abnormalities of gait and mobility (R26.89);Difficulty in walking, not elsewhere classified (R26.2)     Time: [id number] PT Time Calculation (min) (ACUTE ONLY): 20 min  Charges:  $Therapeutic Exercise: 8-22 mins                     08/27/2018   Rande Lawman, PT    Loyal Buba 08/27/2018, 5:17 PM

## 2018-08-28 LAB — BASIC METABOLIC PANEL
Anion gap: 12 (ref 5–15)
BUN: 34 mg/dL — ABNORMAL HIGH (ref 8–23)
CO2: 17 mmol/L — ABNORMAL LOW (ref 22–32)
Calcium: 8.1 mg/dL — ABNORMAL LOW (ref 8.9–10.3)
Chloride: 109 mmol/L (ref 98–111)
Creatinine, Ser: 1.73 mg/dL — ABNORMAL HIGH (ref 0.61–1.24)
GFR calc Af Amer: 39 mL/min — ABNORMAL LOW (ref >60)
GFR calc non Af Amer: 34 mL/min — ABNORMAL LOW (ref >60)
Glucose, Bld: 208 mg/dL — ABNORMAL HIGH (ref 70–99)
Potassium: 4.4 mmol/L (ref 3.5–5.1)
Sodium: 138 mmol/L (ref 135–145)

## 2018-08-28 LAB — CBC
HCT: 27.5 % — ABNORMAL LOW (ref 39.0–52.0)
Hemoglobin: 8.9 g/dL — ABNORMAL LOW (ref 13.0–17.0)
MCH: 31.2 pg (ref 26.0–34.0)
MCHC: 32.4 g/dL (ref 30.0–36.0)
MCV: 96.5 fL (ref 80.0–100.0)
Platelets: 167 10*3/uL (ref 150–400)
RBC: 2.85 MIL/uL — ABNORMAL LOW (ref 4.22–5.81)
RDW: 14.3 % (ref 11.5–15.5)
WBC: 7.7 10*3/uL (ref 4.0–10.5)
nRBC: 0 % (ref 0.0–0.2)

## 2018-08-28 MED ORDER — SODIUM CHLORIDE 0.9 % IV SOLN: INTRAVENOUS | Status: DC

## 2018-08-28 NOTE — Plan of Care (Signed)
Poc progressing.  

## 2018-08-28 NOTE — Progress Notes (Addendum)
  Progress Note    08/28/2018 8:01 AM 6 Days Post-Op  Subjective:  No new complaints this morning   Vitals:   08/27/18 2025 08/28/18 0306  BP: (!) 143/64 (!) 148/66  Pulse: 61 76  Resp: 19 20  Temp: 98.1 F (36.7 C) 97.9 F (36.6 C)  SpO2: 96% 96%   Physical Exam: Lungs:  Non labored Incisions:  L AKA with minimal serous collection on dressing Ext: RLE dressing left in place Neurologic: A&O  CBC    Component Value Date/Time   WBC 7.7 08/28/2018 0352   RBC 2.85 (L) 08/28/2018 0352   HGB 8.9 (L) 08/28/2018 0352   HGB 12.0 (L) 08/09/2018 1622   HCT 27.5 (L) 08/28/2018 0352   HCT 36.9 (L) 08/09/2018 1622   PLT 167 08/28/2018 0352   PLT 158 08/09/2018 1622   MCV 96.5 08/28/2018 0352   MCV 95 08/09/2018 1622   MCH 31.2 08/28/2018 0352   MCHC 32.4 08/28/2018 0352   RDW 14.3 08/28/2018 0352   RDW 14.0 08/09/2018 1622   LYMPHSABS 0.9 08/09/2018 1622   MONOABS 0.3 07/30/2016 0852   EOSABS 0.2 08/09/2018 1622   BASOSABS 0.0 08/09/2018 1622    BMET    Component Value Date/Time   NA 138 08/28/2018 0352   NA 144 08/09/2018 1622   K 4.4 08/28/2018 0352   CL 109 08/28/2018 0352   CO2 17 (L) 08/28/2018 0352   GLUCOSE 208 (H) 08/28/2018 0352   BUN 34 (H) 08/28/2018 0352   BUN 24 08/09/2018 1622   CREATININE 1.73 (H) 08/28/2018 0352   CALCIUM 8.1 (L) 08/28/2018 0352   GFRNONAA 34 (L) 08/28/2018 0352   GFRAA 39 (L) 08/28/2018 0352    INR    Component Value Date/Time   INR 1.1 08/22/2018 1315     Intake/Output Summary (Last 24 hours) at 08/28/2018 0801 Last data filed at 08/28/2018 0522 Gross per 24 hour  Intake 410 ml  Output 850 ml  Net -440 ml     Assessment/Plan:  85 y.o. male is s/p L AKA 6 Days Post-Op   Dry dressing changes as needed L AKA Continue current wound care RLE Void trial today; d/c foley Waiting to hear back from SNF per CSW note; patient will need COVID test prior to d/c   Dagoberto Ligas, PA-C [facility]  [phone] 08/28/2018 8:01 AM   I have seen and evaluated the patient. I agree with the PA note as documented above. L AKA looks good.  Will attempt to remove foley today and voiding trial.  Plan for SNF discharge when bed available.    Marty Heck, MD [facility] [place] Office: [phone] Pager: [phone]

## 2018-08-29 ENCOUNTER — Ambulatory Visit: Payer: Self-pay | Admitting: Pharmacist

## 2018-08-29 ENCOUNTER — Other Ambulatory Visit: Payer: Self-pay | Admitting: Pharmacist

## 2018-08-29 LAB — CBC
HCT: 27.7 % — ABNORMAL LOW (ref 39.0–52.0)
Hemoglobin: 8.8 g/dL — ABNORMAL LOW (ref 13.0–17.0)
MCH: 30.6 pg (ref 26.0–34.0)
MCHC: 31.8 g/dL (ref 30.0–36.0)
MCV: 96.2 fL (ref 80.0–100.0)
Platelets: 177 10*3/uL (ref 150–400)
RBC: 2.88 MIL/uL — ABNORMAL LOW (ref 4.22–5.81)
RDW: 14.4 % (ref 11.5–15.5)
WBC: 6.3 10*3/uL (ref 4.0–10.5)
nRBC: 0.3 % — ABNORMAL HIGH (ref 0.0–0.2)

## 2018-08-29 LAB — BASIC METABOLIC PANEL
Anion gap: 11 (ref 5–15)
BUN: 41 mg/dL — ABNORMAL HIGH (ref 8–23)
CO2: 19 mmol/L — ABNORMAL LOW (ref 22–32)
Calcium: 8.1 mg/dL — ABNORMAL LOW (ref 8.9–10.3)
Chloride: 109 mmol/L (ref 98–111)
Creatinine, Ser: 1.94 mg/dL — ABNORMAL HIGH (ref 0.61–1.24)
GFR calc Af Amer: 34 mL/min — ABNORMAL LOW (ref >60)
GFR calc non Af Amer: 29 mL/min — ABNORMAL LOW (ref >60)
Glucose, Bld: 175 mg/dL — ABNORMAL HIGH (ref 70–99)
Potassium: 4.5 mmol/L (ref 3.5–5.1)
Sodium: 139 mmol/L (ref 135–145)

## 2018-08-29 NOTE — Progress Notes (Addendum)
[facility] [place]  Subjective  - No new complaints.   Objective (!) 143/50 60 97.6 F (36.4 C) (Oral) 16 96%  Intake/Output Summary (Last 24 hours) at 08/29/2018 0750 Last data filed at 08/29/2018 [id number] Gross per 24 hour  Intake 0 ml  Output 525 ml  Net -525 ml    Left AKA stump appears viable and healing well Minimal serious drainage Lungs non labored breathing  Assessment/Planning: POD # 7 Left AKA  Dry guaze with stump sock Voiding trial with 50 cc independent output will cont. To monitor.  Pending SNF and the need for Covid testing.  Roxy Horseman 08/29/2018 7:50 AM --  Right leg dry eschar healing lateral mid tibia, left AKA small amount of serous drainage centrally incision healing Agree with above. D #4/7 of Cipro for UTI Awaiting SNF placement  Otherwise no ongoing medical issues Creatinine slightly elevated but within his baseline range of 1.4-2 if continues to increase will back off diuretic.   Need to make sure he is drinking fluid and no retention  Ruta Hinds, MD [facility] [place]: [phone] Pager: [phone]  Laboratory Lab Results: Recent Labs    08/28/18 0352 08/29/18 0332  WBC 7.7 6.3  HGB 8.9* 8.8*  HCT 27.5* 27.7*  PLT 167 177   BMET Recent Labs    08/28/18 0352 08/29/18 0332  NA 138 139  K 4.4 4.5  CL 109 109  CO2 17* 19*  GLUCOSE 208* 175*  BUN 34* 41*  CREATININE 1.73* 1.94*  CALCIUM 8.1* 8.1*    COAG Lab Results  Component Value Date   INR 1.1 08/22/2018   INR 1.20 01/12/2017   INR 1.08 06/24/2016   No results found for: PTT

## 2018-08-29 NOTE — Patient Outreach (Addendum)
[facility]) Care Management  08/29/2018  Evan [name] 1923/03/21 [id number]   Reviewed patient's chart as he was scheduled for pharmacy follow up today. Unfortunately, he was still hospitalized at the time of my call.. He had a failed attempted recanalization of the popliteal artery. Due to the poor condition of his leg, he ended up undergoing a left above the knee.  Spoke with the patient's daughter Evan [name]). HIPAA identifiers were obtained. Evan [name] said she was currently trying to get the patient established in [place], [place] (which is where she lives).    She wondered about changing his insurance and said she spoke with the patient's concierge at [employer]. She also called the facility she wants to move her father too and was given the name of an Research scientist (life sciences) in [facility].  Patient's daughter was provided the website and contact number for [facility].  Plan: Follow up with patient in 5-7 business days.   [name], PharmD, [place] Clinical Pharmacist [phone]

## 2018-08-29 NOTE — Progress Notes (Signed)
Pt voided 50 cc. Post void bladder scan was 188 cc. Will continue to monitor.

## 2018-08-29 NOTE — TOC Progression Note (Addendum)
Transition of Care [facility]) - Progression Note    Patient Details  Name: Evan [name] MRN: [id number] Date of Birth: Sep 19, 1923  Transition of Care [facility]) CM/SW [place], [place] Phone Number: 08/29/2018, 12:28 PM  Clinical Narrative:     12:24pm-CSW left another message for Admission Coordinator   9:44am- [facility] called [facility] Admission, [place] coordinator-left voice message to return call   Thurmond Butts, MSW, [place] Worker [phone]   Expected Discharge Plan: Home/Self Care    Expected Discharge Plan and Services Expected Discharge Plan: Home/Self Care       Living arrangements for the past 2 months: [place] Expected Discharge Date: 08/20/18                                     Social Determinants of Health (SDOH) Interventions    Readmission Risk Interventions No flowsheet data found.

## 2018-08-29 NOTE — Care Management Important Message (Signed)
Important Message  Patient Details  Name: GATSBY BYMAN MRN: [id number] Date of Birth: 1923-02-23   Medicare Important Message Given:  Yes     Shelda Altes 08/29/2018, 1:05 PM

## 2018-08-30 ENCOUNTER — Telehealth: Payer: Self-pay | Admitting: Internal Medicine

## 2018-08-30 NOTE — Progress Notes (Signed)
Physical Therapy Treatment Patient Details Name: Evan [name] MRN: [id number] DOB: 26-Nov-1923 Today's Date: 08/30/2018    History of Present Illness pt is a 85 y/o male admitted for evaluation of left foot pain and ulceration with recent arterial study showing poor perfusion of that L leg.  Pt s/p attempted recanalization left popliteal artery without significant success.   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  PMHx:  arthritis, CAD, CHF, DM2, HTN, pAFIB, vertigo.    PT Comments    Patient received sleeping in bed, daughter present. Patient requires multimodal cues for all mobility. Flat affect, requires increased time to initiate mobility. Requires min/mod assist and max cues to attain sitting up on the side of the bed with foot on the floor. Patient requires mod +2 assist to stand from elevated bed. Unable to achieve full standing with weight on B UEs. Patient is able to pivot right foot around to get to recliner with mod assist. Cues and min assist needed for positioning in chair. Patient will benefit from continued skilled PT to improve mobility, improve activity tolerance, increase strength.      Follow Up Recommendations  SNF;Supervision/Assistance - 24 hour     Equipment Recommendations  None recommended by PT    Recommendations for Other Services       Precautions / Restrictions Precautions Precautions: Fall Restrictions Weight Bearing Restrictions: Yes RLE Weight Bearing: Weight bearing as tolerated LLE Weight Bearing: Non weight bearing    Mobility  Bed Mobility Overal bed mobility: Needs Assistance Bed Mobility: Supine to Sit     Supine to sit: Mod assist     General bed mobility comments: cues needed for hand placement, to get scooted to edge of bed.  Transfers Overall transfer level: Needs assistance Equipment used: Rolling walker (2 wheeled) Transfers: Sit to/from Stand Sit to Stand: Mod assist;+2 physical assistance Stand pivot transfers: Mod assist;+2 physical  assistance          Ambulation/Gait             General Gait Details: pivot to recliner only   Stairs             Wheelchair Mobility    Modified Rankin (Stroke Patients Only)       Balance Overall balance assessment: Needs assistance Sitting-balance support: Feet supported;Single extremity supported Sitting balance-Leahy Scale: Fair     Standing balance support: Bilateral upper extremity supported;During functional activity Standing balance-Leahy Scale: Poor Standing balance comment: unable to reach upright standing position despite verbal and tactile cues, decreased efficiency and use of UEs to assist despite cues                            Cognition Arousal/Alertness: Awake/alert;Lethargic Behavior During Therapy: [facility] for tasks assessed/performed;Flat affect Overall Cognitive Status: Impaired/Different from baseline Area of Impairment: Awareness;Following commands;Safety/judgement                       Following Commands: Follows one step commands inconsistently Safety/Judgement: Decreased awareness of safety Awareness: Emergent Problem Solving: Slow processing;Decreased initiation;Requires verbal cues;Requires tactile cues;Difficulty sequencing        Exercises Other Exercises Other Exercises: supine SLR on right, hip abd/add bilateral x 10 reps    General Comments General comments (skin integrity, edema, etc.): educated daughter about having him transfer to his right when able.      Pertinent Vitals/Pain Pain Assessment: No/denies pain    Home Living  Prior Function            PT Goals (current goals can now be found in the care plan section) Acute Rehab PT Goals Patient Stated Goal: to be able to transfer from bed to wheelchair to car to get to rehab. PT Goal Formulation: With family Time For Goal Achievement: 09/06/18 Potential to Achieve Goals: Fair Progress towards PT goals:  Progressing toward goals    Frequency    Min 3X/week      PT Plan Current plan remains appropriate    Co-evaluation              AM-PAC PT "6 Clicks" Mobility   Outcome Measure  Help needed turning from your back to your side while in a flat bed without using bedrails?: A Lot Help needed moving from lying on your back to sitting on the side of a flat bed without using bedrails?: A Lot Help needed moving to and from a bed to a chair (including a wheelchair)?: A Lot Help needed standing up from a chair using your arms (e.g., wheelchair or bedside chair)?: A Lot Help needed to walk in hospital room?: Total Help needed climbing 3-5 steps with a railing? : Total 6 Click Score: 10    End of Session Equipment Utilized During Treatment: Gait belt Activity Tolerance: Patient limited by lethargy;Patient limited by fatigue Patient left: in chair;with call bell/phone within reach;with family/visitor present Nurse Communication: Mobility status PT Visit Diagnosis: Muscle weakness (generalized) (M62.81);Difficulty in walking, not elsewhere classified (R26.2)     Time: 1240-1310 PT Time Calculation (min) (ACUTE ONLY): 30 min  Charges:  $Therapeutic Activity: 23-37 mins                     Shenice Dolder, PT, GCS 08/30/18,1:23 PM

## 2018-08-30 NOTE — Telephone Encounter (Signed)
  Daughter is calling because patient will be moving to [facility] and they would like to see if we have a cardiologist that we can refer him to there.

## 2018-08-30 NOTE — Progress Notes (Addendum)
[facility] [place]  Subjective  - No new complaints with left LE.   Objective (!) 149/60 60 98 F (36.7 C) (Oral) 14 100%  Intake/Output Summary (Last 24 hours) at 08/30/2018 [id number] Last data filed at 08/30/2018 0500 Gross per 24 hour  Intake 250 ml  Output 350 ml  Net -100 ml    Left AKA healing well Dry dressing and retention sock in place Lungs non labored breathing UO 350 cc  Assessment/Planning: POD #  8 left AKA  Ready for discharge pending SNF placement Stable for discharge  Evan [name] 08/30/2018 8:07 AM -- Agree with above. Daughter updated at bedside  Ruta Hinds, MD [facility] [place]: [phone] Pager: [phone]  Laboratory Lab Results: Recent Labs    08/28/18 0352 08/29/18 0332  WBC 7.7 6.3  HGB 8.9* 8.8*  HCT 27.5* 27.7*  PLT 167 177   BMET Recent Labs    08/28/18 0352 08/29/18 0332  NA 138 139  K 4.4 4.5  CL 109 109  CO2 17* 19*  GLUCOSE 208* 175*  BUN 34* 41*  CREATININE 1.73* 1.94*  CALCIUM 8.1* 8.1*    COAG Lab Results  Component Value Date   INR 1.1 08/22/2018   INR 1.20 01/12/2017   INR 1.08 06/24/2016   No results found for: PTT

## 2018-08-30 NOTE — TOC Progression Note (Signed)
Transition of Care [facility]) - Progression Note    Patient Details  Name: Evan [name] MRN: [id number] Date of Birth: [birth year]  Transition of Care [facility]) CM/SW [place], [place] Phone Number: 08/30/2018, 10:04 AM  Clinical Narrative:     Spoke with patient's daughter, [name] advised CSW has been unable to communicate with [facility] admission director, Starling Manns. Patient's daughter states she has talked with [employer] insurance and the [place]. Patient's insurance is not in network and they are working on Biochemist, clinical. She was not provided with a timeframe for the process. CSW advised [facility] has not returned phone calls. CSW expressed concern if the patient is medially stable and is discharged the patient will be responsible medical expenses. Patient's daughter states she understands and hopefully insurance and rehab will come to an agreement today. She states her  gaol is to get her father moved to [facility].   CSW called [facility] to follow up-CSW left voice message to return call.  CSW will continue to follow and assist with discharge planning.  Thurmond Butts, MSW, [facility] Clinical Social Worker [phone]    Expected Discharge Plan: Home/Self Care    Expected Discharge Plan and Services Expected Discharge Plan: Home/Self Care       Living arrangements for the past 2 months: [place] Expected Discharge Date: 08/20/18                                     Social Determinants of Health (SDOH) Interventions    Readmission Risk Interventions No flowsheet data found.

## 2018-08-30 NOTE — Telephone Encounter (Signed)
Laurey Arrow, Do you know of any cardiologists in [place] to whom you would refer? Kimberlynn Lumbra

## 2018-08-31 ENCOUNTER — Other Ambulatory Visit: Payer: Self-pay

## 2018-08-31 ENCOUNTER — Other Ambulatory Visit: Payer: Self-pay | Admitting: Cardiology

## 2018-08-31 LAB — SARS CORONAVIRUS 2 BY RT PCR (HOSPITAL ORDER, PERFORMED IN ~~LOC~~ HOSPITAL LAB): SARS Coronavirus 2: NEGATIVE

## 2018-08-31 MED ORDER — GLUCERNA SHAKE PO LIQD
237.0000 mL | Freq: Three times a day (TID) | ORAL | Status: DC
  Administered 2018-08-31 – 2018-09-02 (×5): 237 mL via ORAL

## 2018-08-31 MED ORDER — ADULT MULTIVITAMIN W/MINERALS CH
1.0000 | ORAL_TABLET | Freq: Every day | ORAL | Status: DC
  Administered 2018-08-31 – 2018-09-02 (×3): 1 via ORAL
  Filled 2018-08-31 (×3): qty 1

## 2018-08-31 NOTE — Telephone Encounter (Signed)
I don't suspect big group at [place] would be fine

## 2018-08-31 NOTE — Progress Notes (Signed)
Pt moved from chair with walker to bed requiring maximum assistance to use bedpan as pt is too weak to transfer from chair to [facility]. Pt not bearing weight on right leg as a result from weakness thus requiring staff to lift and transfer patient. Educated daughter on how weak the pt is and daughter is adamant about transferring via walker. This tech explained that transferring via skylyft in the room would be the way he could go from bed to chair or chair to bed. Daughter expressed willingness and understanding. Patient is currently in chair resting and call light is in his lap.

## 2018-08-31 NOTE — TOC Progression Note (Signed)
Transition of Care [facility]) - Progression Note    Patient Details  Name: SHANDELL PAVELICH MRN: [id number] Date of Birth: [birth year]  Transition of Care [facility]) CM/SW [place], [place] Phone Number: 08/31/2018, 4:04 PM  Clinical Narrative:     CSW updated attending MD on insurance denial for SNF denial through [employer]- provided Peer to Peer # Dr. Amalia Hailey [phone].  Thurmond Butts, MSW, [facility] Clinical Social Worker [phone]   Expected Discharge Plan: Home/Self Care    Expected Discharge Plan and Services Expected Discharge Plan: Home/Self Care       Living arrangements for the past 2 months: [place] Expected Discharge Date: 08/20/18                                     Social Determinants of Health (SDOH) Interventions    Readmission Risk Interventions No flowsheet data found.

## 2018-08-31 NOTE — Progress Notes (Signed)
Initial Nutrition Assessment  DOCUMENTATION CODES:   Not applicable  INTERVENTION:    Glucerna Shake po TID, each supplement provides 220 kcal and 10 grams of protein  MVI daily  NUTRITION DIAGNOSIS:   Increased nutrient needs related to post-op healing as evidenced by estimated needs.  GOAL:   Patient will meet greater than or equal to 90% of their needs  MONITOR:   PO intake, Supplement acceptance, Weight trends, Labs, I & O's  REASON FOR ASSESSMENT:   LOS    ASSESSMENT:   Patient with PMH significant for BPH, CAD, CHF, DM, GERD, HTN, and PVD. Presents this admission with L foot PVD ulcer.   8/14- abd aortogram with LLE runoff 8/17- L AKA   RD working remotely.  Attempted to call pt via phone, no answer. Meal completions charted as 0-50% for pt's last eight meals. Will need to obtain nutrition/weight history if possible. RD to send supplementation to maximize kcal and protein. Medically stable for d/c, awaiting placement.   Per chart records weigh stable throughout the year.   Drips: Mg sulfate Medications: colace, ferrous sulfate Labs: CBG 171-222 Cr 1.94- increasing    Diet Order:   Diet Order            Diet Carb Modified Fluid consistency: Thin; Room service appropriate? Yes  Diet effective now              EDUCATION NEEDS:   Not appropriate for education at this time  Skin:  Skin Assessment: Skin Integrity Issues: Skin Integrity Issues:: Stage I, Other (Comment) Stage I: coccyx Other: R tibial, s/p L AKA  Last BM:  8/25  Height:   Ht Readings from Last 1 Encounters:  08/18/18 5\' 7"  (1.702 m)    Weight:   Wt Readings from Last 1 Encounters:  08/18/18 72.7 kg    Ideal Body Weight:  61.9 kg(adjust L AKA)  BMI:  Body mass index is 25.11 kg/m.  Estimated Nutritional Needs:   Kcal:  1850-2050 kcal  Protein:  90-115 grams  Fluid:  >/= 1.8 L/day  Mariana Single RD, LDN Clinical Nutrition Pager # - [phone]

## 2018-08-31 NOTE — Progress Notes (Addendum)
  Progress Note    08/31/2018 7:41 AM 9 Days Post-Op  Subjective:  No new complaints this morning   Vitals:   08/30/18 2002 08/31/18 0326  BP: 138/65 (!) 150/59  Pulse: 60 60  Resp: 15 12  Temp: 97.8 F (36.6 C) 97.8 F (36.6 C)  SpO2:  100%   Physical Exam Lungs:  Non labored Incisions:  L AKA incision c/d/i; no drainage Ext: RLE dressing left in place Neurologic: A&O  CBC    Component Value Date/Time   WBC 6.3 08/29/2018 0332   RBC 2.88 (L) 08/29/2018 0332   HGB 8.8 (L) 08/29/2018 0332   HGB 12.0 (L) 08/09/2018 1622   HCT 27.7 (L) 08/29/2018 0332   HCT 36.9 (L) 08/09/2018 1622   PLT 177 08/29/2018 0332   PLT 158 08/09/2018 1622   MCV 96.2 08/29/2018 0332   MCV 95 08/09/2018 1622   MCH 30.6 08/29/2018 0332   MCHC 31.8 08/29/2018 0332   RDW 14.4 08/29/2018 0332   RDW 14.0 08/09/2018 1622   LYMPHSABS 0.9 08/09/2018 1622   MONOABS 0.3 07/30/2016 0852   EOSABS 0.2 08/09/2018 1622   BASOSABS 0.0 08/09/2018 1622    BMET    Component Value Date/Time   NA 139 08/29/2018 0332   NA 144 08/09/2018 1622   K 4.5 08/29/2018 0332   CL 109 08/29/2018 0332   CO2 19 (L) 08/29/2018 0332   GLUCOSE 175 (H) 08/29/2018 0332   BUN 41 (H) 08/29/2018 0332   BUN 24 08/09/2018 1622   CREATININE 1.94 (H) 08/29/2018 0332   CALCIUM 8.1 (L) 08/29/2018 0332   GFRNONAA 29 (L) 08/29/2018 0332   GFRAA 34 (L) 08/29/2018 0332    INR    Component Value Date/Time   INR 1.1 08/22/2018 1315     Intake/Output Summary (Last 24 hours) at 08/31/2018 0741 Last data filed at 08/31/2018 0327 Gross per 24 hour  Intake 240 ml  Output 200 ml  Net 40 ml     Assessment/Plan:  85 y.o. male is s/p L AKA 9 Days Post-Op   L AKA incision healing well CSW working on rehab placement Ready for discharge when insurance and bed approved   Dagoberto Ligas, PA-C [facility] [phone] 08/31/2018 7:41 AM  Has been medically stable and ready for d/c for 4 days as of today  Daughter wants transfer to [place].  Not sure where we stand on this. Left AKA healing POD 9 Renal dysfunction stable  Urinary retention resolved Anemia chronic Moderate malnutrition chronic  Ruta Hinds, MD [facility] [place] Office: [phone] Pager: [phone]

## 2018-08-31 NOTE — Progress Notes (Signed)
LATE ENTRY for 08/29/18:  Pt called to front desk requesting to use BSC. Pt's daughter stated she wasn't able to do so and requested this tech to help pt to [facility]. This tech transferred pt(using walker) to [facility] and required maximum assistance as he was very weak and could not tolerate standing due to weakness. This tech and RN Janett Billow) assisted pt using walker to bed but once again was too weak to stand and nearly fell transferring from [facility] to bed. Explained to daughter and pt for the sake of the pts safety and the staff safety that it would not be safe to place patient back in the chair and needed to stay in the bed. Once tech left and pts RN Jerene Pitch) went back into the room, daughter requested pt to get back in the chair and stated she(herself) got him to the [facility] without trouble and this tech and RN(Jessica) placed him back into the bed without problem. However, this tech made Brooke,RN aware of the circumstance before she went into the room, and before daughter could tell her false information.

## 2018-08-31 NOTE — TOC Progression Note (Signed)
Transition of Care [facility]) - Progression Note    Patient Details  Name: SHAHRAM FREW MRN: [id number] Date of Birth: 11/05/1923  Transition of Care [facility]) CM/SW [place], [place] Phone Number: 08/31/2018, 5:19 PM  Clinical Narrative:     Patient's daughter [name] informed CSW she is renting a Chiropractor and a wheel chair to transport the patient to [facility]. She was informed about insurance denial for SNF. She states she is aware that [employer] is out of network and is going to pay out of pocket for SNF. She has arranged for the patient to have new insurance plan effective 09/06/2018.   Admission Director, Janann August confirmed the plan. They request to have discharge summary and covid results  faxed to [phone]. They required Covid test with in 48 prior to discharge. Covid test was requested today. They do admit patients over the weekend.   Thurmond Butts, MSW, [facility] Clinical Social Worker [phone]     Expected Discharge Plan: Home/Self Care    Expected Discharge Plan and Services Expected Discharge Plan: Home/Self Care       Living arrangements for the past 2 months: [place] Expected Discharge Date: 08/20/18                                     Social Determinants of Health (SDOH) Interventions    Readmission Risk Interventions No flowsheet data found.

## 2018-08-31 NOTE — Progress Notes (Signed)
Occupational Therapy Treatment Patient Details Name: Evan [name] MRN: [id number] DOB: Apr 10, 1923 Today's Date: 08/31/2018    History of present illness Pt is a 85 y/o male admitted for evaluation of left foot pain and ulceration with recent arterial study showing poor perfusion of that L leg.  Pt s/p attempted recanalization left popliteal artery without significant success.   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  PMHx:  arthritis, CAD, CHF, DM2, HTN, pAFIB, vertigo.   OT comments  Returned to see patient per request of daughter, paged by NT and notified by PT.  Patient requesting to return to bed from recliner, required multimodal cueing max assist for lateral scoot using bed pad to R side.  Patient continues to be limited by fatigue, weakness, and decreased activity tolerance.   Addressed daughters questions on OT level and assist requirement for self care, role of OT at acute level, and recommendations for SNF rehab.  Contacted OT supervisor to address further concerns.    DC plan remains appropriate.  Pt needs 24/7 physical assistance, and post acute rehab in order to optimize return to PLOF and independence with ADLs/mobility.     Follow Up Recommendations  SNF    Equipment Recommendations  3 in 1 bedside commode(drop arm)    Recommendations for Other Services      Precautions / Restrictions Precautions Precautions: Fall Precaution Comments: left residual limb - no drainage Restrictions Weight Bearing Restrictions: Yes RLE Weight Bearing: Weight bearing as tolerated LLE Weight Bearing: Non weight bearing       Mobility Bed Mobility Overal bed mobility: Needs Assistance Bed Mobility: Sit to Supine     Supine to sit: +2 for physical assistance;Max assist;Mod assist Sit to supine: Min assist   General bed mobility comments: min assist to transiton to supine from EOB, mgmt of R LE and guiding support of trunk   Transfers Overall transfer level: Needs assistance Equipment used:  Rolling walker (2 wheeled) Transfers: Lateral/Scoot Transfers Sit to Stand: Max assist;+2 physical assistance   Squat pivot transfers: +2 physical assistance;Max assist    Lateral/Scoot Transfers: Max assist General transfer comment: pt seated in recliner, fatigued and eager to return back to bed; max assist lateral scoot to R side using bed pad with therapist blocking R knee, cueing for technique and sequencing     Balance Overall balance assessment: Needs assistance Sitting-balance support: Bilateral upper extremity supported;Feet supported Sitting balance-Leahy Scale: Fair     Standing balance support: Bilateral upper extremity supported Standing balance-Leahy Scale: Poor Standing balance comment: unable to reach upright standing position, standing on R LE and relaint on B UE and external support                           ADL either performed or assessed with clinical judgement   ADL Overall ADL's : Needs assistance/impaired                         Toilet Transfer: Maximal assistance;Cueing for safety;Cueing for sequencing Toilet Transfer Details (indicate cue type and reason): max assist for lateral scooting towards R side to edge of recliner, then pivot to EOB using bed pad given cueing for hand placement, safety and sequencing throughout session         Functional mobility during ADLs: Maximal assistance General ADL Comments: pt limited by weakness, decreased activity tolerance and endurance     Vision  Perception     Praxis      Cognition Arousal/Alertness: Lethargic Behavior During Therapy: Flat affect Overall Cognitive Status: Impaired/Different from baseline Area of Impairment: Awareness;Problem solving;Following commands;Safety/judgement                       Following Commands: Follows multi-step commands inconsistently;Follows one step commands with increased time;Follows multi-step commands with increased  time Safety/Judgement: Decreased awareness of safety;Decreased awareness of deficits Awareness: Emergent Problem Solving: Slow processing;Decreased initiation;Requires verbal cues;Requires tactile cues;Difficulty sequencing General Comments: Very HOH        Exercises     Shoulder Instructions       General Comments patients daughter present, voiced increased frustration with poor "team collaboration" for dc, not seeing OT for 10 days (since eval); therapist reviewed OT frequency for acute services and addressed daughters concerns; reached out to OT supervisor to address further; daughter then spoke about plans for her fathers apartment after dc from SNF and therapist encouarged daughter to see what level he his after dc to determine his most appropraite needs (but also educated on the option of a drop arm 3:1 commode)      Pertinent Vitals/ Pain       Pain Assessment: No/denies pain  Home Living                                          Prior Functioning/Environment              Frequency  Min 2X/week        Progress Toward Goals  OT Goals(current goals can now be found in the care plan section)  Progress towards OT goals: Progressing toward goals  Acute Rehab OT Goals Patient Stated Goal: to be able to transfer from bed to wheelchair to car to get to rehab. OT Goal Formulation: With patient Time For Goal Achievement: 09/06/18 Potential to Achieve Goals: Good  Plan Discharge plan remains appropriate;Frequency remains appropriate    Co-evaluation                 AM-PAC OT "6 Clicks" Daily Activity     Outcome Measure   Help from another person eating meals?: None Help from another person taking care of personal grooming?: A Little Help from another person toileting, which includes using toliet, bedpan, or urinal?: Total Help from another person bathing (including washing, rinsing, drying)?: A Lot Help from another person to put on and  taking off regular upper body clothing?: A Little Help from another person to put on and taking off regular lower body clothing?: Total 6 Click Score: 14    End of Session Equipment Utilized During Treatment: Gait belt  OT Visit Diagnosis: Other abnormalities of gait and mobility (R26.89);Muscle weakness (generalized) (M62.81)   Activity Tolerance Patient tolerated treatment well   Patient Left in chair;with call bell/phone within reach;with nursing/sitter in room   Nurse Communication Mobility status        Time: [id number] OT Time Calculation (min): 27 min  Charges: OT General Charges $OT Visit: 1 Visit OT Treatments $Self Care/Home Management : 8-22 mins $Therapeutic Activity: 8-22 mins  Delight Stare, [place] Pager [phone] Office [phone]    Delight Stare 08/31/2018, 2:28 PM

## 2018-08-31 NOTE — Progress Notes (Signed)
Occupational Therapy Treatment Patient Details Name: Evan [name] MRN: [id number] DOB: [birth year] Today's Date: 08/31/2018    History of present illness Pt is a 85 y/o male admitted for evaluation of left foot pain and ulceration with recent arterial study showing poor perfusion of that L leg.  Pt s/p attempted recanalization left popliteal artery without significant success.   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  PMHx:  arthritis, CAD, CHF, DM2, HTN, pAFIB, vertigo.   OT comments  Patient seated in recliner, agreeable to OT.  Attempted stand pivot with RW, but limited by weakness, posture and sequencing.  Completed BSC transfer with max assist +2 squat pivot.  Total assist for toileting.  Patient fatigues easily, poor recall of technique, sequencing.  SNF remains appropriate.    Follow Up Recommendations  SNF    Equipment Recommendations  3 in 1 bedside commode(drop arm )    Recommendations for Other Services      Precautions / Restrictions Precautions Precautions: Fall Precaution Comments: left residual limb - no drainage Restrictions Weight Bearing Restrictions: Yes RLE Weight Bearing: Weight bearing as tolerated LLE Weight Bearing: Non weight bearing       Mobility Bed Mobility               General bed mobility comments: OOB upon entry  Transfers Overall transfer level: Needs assistance Equipment used: Rolling walker (2 wheeled) Transfers: Sit to/from [employer] Sit to Stand: Max assist;+2 physical assistance;+2 safety/equipment   Squat pivot transfers: +2 physical assistance;Max assist     General transfer comment: sit to stand with max +2 using RW, assist for repositioning R LE for increased support in standing but unable to pivot on R LE with RW support; squat pivot with assist to power up and shift hips to/from [facility]    Balance Overall balance assessment: Needs assistance Sitting-balance support: Feet supported;Single extremity supported Sitting  balance-Leahy Scale: Fair     Standing balance support: Bilateral upper extremity supported;During functional activity Standing balance-Leahy Scale: Poor Standing balance comment: unable to reach upright standing position, standing on R LE and relaint on B UE and external support                           ADL either performed or assessed with clinical judgement   ADL Overall ADL's : Needs assistance/impaired     Grooming: Set up;Sitting                   Toilet Transfer: Maximal assistance;+2 for physical assistance;Squat-pivot;BSC Toilet Transfer Details (indicate cue type and reason): +2 assist to/from [facility]  Toileting- Clothing Manipulation and Hygiene: Total assistance;+2 for physical assistance;+2 for safety/equipment;Sit to/from stand       Functional mobility during ADLs: +2 for physical assistance;+2 for safety/equipment;Maximal assistance General ADL Comments: pt limited by impaired balance, generalized weakness      Vision       Perception     Praxis      Cognition Arousal/Alertness: Awake/alert Behavior During Therapy: WFL for tasks assessed/performed;Flat affect Overall Cognitive Status: Impaired/Different from baseline Area of Impairment: Awareness;Following commands;Safety/judgement;Problem solving                       Following Commands: Follows one step commands inconsistently Safety/Judgement: Decreased awareness of safety Awareness: Emergent Problem Solving: Slow processing;Decreased initiation;Requires verbal cues;Requires tactile cues;Difficulty sequencing          Exercises  Shoulder Instructions       General Comments NT present and educated on transfer to R side    Pertinent Vitals/ Pain       Pain Assessment: No/denies pain  Home Living                                          Prior Functioning/Environment              Frequency  Min 2X/week        Progress Toward  Goals  OT Goals(current goals can now be found in the care plan section)  Progress towards OT goals: Progressing toward goals  Acute Rehab OT Goals Patient Stated Goal: to be able to transfer from bed to wheelchair to car to get to rehab.  Plan Discharge plan remains appropriate;Frequency remains appropriate    Co-evaluation                 AM-PAC OT "6 Clicks" Daily Activity     Outcome Measure   Help from another person eating meals?: None Help from another person taking care of personal grooming?: A Little Help from another person toileting, which includes using toliet, bedpan, or urinal?: Total Help from another person bathing (including washing, rinsing, drying)?: A Lot Help from another person to put on and taking off regular upper body clothing?: A Little Help from another person to put on and taking off regular lower body clothing?: Total 6 Click Score: 14    End of Session Equipment Utilized During Treatment: Gait belt;Rolling walker  OT Visit Diagnosis: Other abnormalities of gait and mobility (R26.89);Muscle weakness (generalized) (M62.81)   Activity Tolerance Patient tolerated treatment well   Patient Left in chair;with call bell/phone within reach;with nursing/sitter in room   Nurse Communication Mobility status        Time: [id number] OT Time Calculation (min): 17 min  Charges: OT General Charges $OT Visit: 1 Visit OT Treatments $Self Care/Home Management : 8-22 mins  Delight Stare, [place] Pager [phone] Office [phone]    Delight Stare 08/31/2018, 10:10 AM

## 2018-08-31 NOTE — Progress Notes (Signed)
Physical Therapy Treatment Patient Details Name: Evan MCMANAWAY MRN: [id number] DOB: 1923-06-17 Today's Date: 08/31/2018    History of Present Illness Pt is a 85 y/o male admitted for evaluation of left foot pain and ulceration with recent arterial study showing poor perfusion of that L leg.  Pt s/p attempted recanalization left popliteal artery without significant success.   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  PMHx:  arthritis, CAD, CHF, DM2, HTN, pAFIB, vertigo.    PT Comments    Daughter very upset upon entering room, regarding not having been able to speak to OT. OT who saw him this morning was notified.  Patient received in bed, lethargic, limited participation with mobility. Requires Max+2 assist for all bed mobility and transfers at this time. Patient is limited by fatigue and lethargy. Patient will benefit from continued skilled PT to improve strength, and functional independence.     Follow Up Recommendations  SNF;Supervision/Assistance - 24 hour     Equipment Recommendations  None recommended by PT    Recommendations for Other Services       Precautions / Restrictions Precautions Precautions: Fall Precaution Comments: left residual limb - no drainage Restrictions Weight Bearing Restrictions: Yes RLE Weight Bearing: Weight bearing as tolerated LLE Weight Bearing: Non weight bearing    Mobility  Bed Mobility Overal bed mobility: Needs Assistance Bed Mobility: Supine to Sit     Supine to sit: +2 for physical assistance;Max assist;Mod assist     General bed mobility comments: OOB upon entry  Transfers Overall transfer level: Needs assistance Equipment used: Rolling walker (2 wheeled) Transfers: Sit to/from Stand Sit to Stand: Max assist;+2 physical assistance   Squat pivot transfers: +2 physical assistance;Max assist     General transfer comment: squat pivot transfer from bed to recliner. +2 max assist. Sit to stand with RW to remove pad from under bottom, requires +2 max  assist to barely clear bottom from recliner.  Ambulation/Gait             General Gait Details: pivot to recliner only   Stairs             Wheelchair Mobility    Modified Rankin (Stroke Patients Only)       Balance Overall balance assessment: Needs assistance Sitting-balance support: Bilateral upper extremity supported;Feet supported Sitting balance-Leahy Scale: Fair     Standing balance support: Bilateral upper extremity supported Standing balance-Leahy Scale: Poor Standing balance comment: unable to reach upright standing position, standing on R LE and relaint on B UE and external support                            Cognition Arousal/Alertness: Lethargic Behavior During Therapy: Flat affect Overall Cognitive Status: Impaired/Different from baseline Area of Impairment: Attention;Awareness;Problem solving;Following commands;Safety/judgement                       Following Commands: Follows multi-step commands inconsistently Safety/Judgement: Decreased awareness of safety Awareness: Emergent Problem Solving: Slow processing;Decreased initiation;Requires verbal cues;Requires tactile cues;Difficulty sequencing General Comments: Very HOH      Exercises      General Comments General comments (skin integrity, edema, etc.): NT present and educated on transfer to R side      Pertinent Vitals/Pain Pain Assessment: No/denies pain    Home Living                      Prior Function  PT Goals (current goals can now be found in the care plan section) Acute Rehab PT Goals Patient Stated Goal: to be able to transfer from bed to wheelchair to car to get to rehab. PT Goal Formulation: With family Time For Goal Achievement: 09/06/18 Potential to Achieve Goals: Fair Progress towards PT goals: Not progressing toward goals - comment(continues to require max+2 assist for transfers, limited ability to assist)     Frequency    Min 3X/week      PT Plan Current plan remains appropriate    Co-evaluation              AM-PAC PT "6 Clicks" Mobility   Outcome Measure  Help needed turning from your back to your side while in a flat bed without using bedrails?: A Lot Help needed moving from lying on your back to sitting on the side of a flat bed without using bedrails?: A Lot Help needed moving to and from a bed to a chair (including a wheelchair)?: A Lot Help needed standing up from a chair using your arms (e.g., wheelchair or bedside chair)?: A Lot Help needed to walk in hospital room?: Total Help needed climbing 3-5 steps with a railing? : Total 6 Click Score: 10    End of Session Equipment Utilized During Treatment: Gait belt Activity Tolerance: Patient limited by fatigue;Patient limited by lethargy Patient left: in chair;with call bell/phone within reach;with family/visitor present Nurse Communication: Mobility status PT Visit Diagnosis: Muscle weakness (generalized) (M62.81);Difficulty in walking, not elsewhere classified (R26.2);Other abnormalities of gait and mobility (R26.89)     Time: 1115-1140 PT Time Calculation (min) (ACUTE ONLY): 25 min  Charges:  $Therapeutic Activity: 23-37 mins                     [employer], PT, [facility] 08/31/18,12:22 PM

## 2018-08-31 NOTE — Patient Outreach (Signed)
  [facility] [facility]) Care Management Chronic Special Needs Program   08/31/2018  Name: Evan [name], DOB: [birth year]  MRN: [id number]  The client was discussed in today's interdisciplinary care team meeting.  The following issues were discussed:  Client's needs, Changes in health status, Coordination of care, Care transitions and Issues/barriers to care  Participants present:  Bary Castilla, RNCM; Mahlon Gammon, RNCM; Peter Garter, RNCM; Thea Silversmith, RNCM; Kelli Churn, [facility]; Gilda Crease, PharmD; Dr. Coralie Carpen; Dr. Marco Collie  Plan: [facility] will continue care coordination with hospital liaison/inpatient care management.   Thea Silversmith, RN, MSN, [place] [phone]

## 2018-08-31 NOTE — Telephone Encounter (Signed)
Please let family know that we do not have a specific provider to recommend. [facility] is a good group.  I recommend he get established with someone there. Richardson Dopp, PA-C    08/31/2018 5:17 PM

## 2018-09-01 ENCOUNTER — Encounter: Payer: PPO | Admitting: Internal Medicine

## 2018-09-01 ENCOUNTER — Other Ambulatory Visit: Payer: Self-pay

## 2018-09-01 ENCOUNTER — Telehealth: Payer: Self-pay | Admitting: Cardiovascular Disease

## 2018-09-01 NOTE — Discharge Summary (Addendum)
[facility] Discharge Summary   Patient ID:  Evan [name] MRN: [id number] DOB/AGE: 1923/08/30 85 y.o.  Admit date: 08/18/2018 Discharge date: 09/02/2018  Date of Surgery: 08/22/2018 Surgeon: Surgeon(s): Oneida Alar Jessy Oto, MD  Admission Diagnosis: gangrene foot  Discharge Diagnoses:  gangrene foot  Secondary Diagnoses: Past Medical History:  Diagnosis Date  . AC joint dislocation   . Allergic rhinitis   . Arthritis   . Atrial tachycardia ([place])   . BPH (benign prostatic hyperplasia)   . CAD   . CHF (congestive heart failure) ([place])   . Diabetic foot ulcers ([place]) 08/2018  . DM    type 2  . GERD (gastroesophageal reflux disease)   . H/O: GI bleed   . HYPERCHOLESTEROLEMIA   . HYPERTENSION   . Hypothyroidism   . Iron deficiency anemia   . NEPHROLITHIASIS, HX OF   . Paroxysmal atrial fibrillation (HCC)   . Rib fracture   . TRANSIENT ISCHEMIC ATTACKS, HX OF   . 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 Vertigo     Procedure(s): LEFT ABOVE KNEE AMPUTATION  Discharged Condition: stable  HPI: Evan J Markhamis a 85 y.o.male,with a 10-month history of pain in his left foot as well as a left foot ulcer. The patient does not know how the ulcer started. He was initially seen at the wound center and sent for further evaluation after an arterial study showed poor perfusion of his left leg. Patient has had bilateral lower extremity edema over the last few months as well. He states the pain in his left foot is almost constant. Most of his day consists of some walking around the house but otherwise in his recliner. Other medical problems includeatrial fibrillation, coronary artery disease, congestive heart failure, diabetes, elevated cholesterol, high blood pressure. All of these are currently controlled. Patient is on Plavix but not on systemic anticoagulation due to his age and risk of injury. He is a former smoker but quit in  1958. He previously had vein harvested from the right leg for coronary artery bypass grafting. Most recent echo showed ejection fraction 25 to 30%. Most recent serum creatinine was 1.48 on March 16, 2017.  A limited duplex was performed which showed occlusion of the left popliteal artery.  Dr. Oneida Alar stated he was not a good candidate for revascularization and planned left AKA.    Hospital Course:  Evan [name] is a 85 y.o. male is S/P Procedure(s): LEFT ABOVE KNEE AMPUTATION  He has had good healing of the left stump and it appears viable.  He has  An area of posterior stump ecchymosis. Without skin break down.  Staples are maintained and should remain for 4 weeks total post op.    Right LE has a superficial abrasion.  Continue dry wrap with ace compression to prevent wound breakdown on the right LE.  He is being discharged in stable condition into his daughters care.    Patient's daughter [name] informed CSW she is renting a Chiropractor and a wheel chair to transport the patient to [facility]. She was informed about insurance denial for SNF. She states she is aware that [facility] is out of network and is going to pay out of pocket for SNF. She has arranged for the patient to have new insurance plan effective 09/06/2018.   Covid test negative.   Significant Diagnostic Studies: CBC Lab Results  Component Value Date   WBC 6.3 08/29/2018  HGB 8.8 (L) 08/29/2018   HCT 27.7 (L) 08/29/2018   MCV 96.2 08/29/2018   PLT 177 08/29/2018    BMET    Component Value Date/Time   NA 139 08/29/2018 0332   NA 144 08/09/2018 1622   K 4.5 08/29/2018 0332   CL 109 08/29/2018 0332   CO2 19 (L) 08/29/2018 0332   GLUCOSE 175 (H) 08/29/2018 0332   BUN 41 (H) 08/29/2018 0332   BUN 24 08/09/2018 1622   CREATININE 1.94 (H) 08/29/2018 0332   CALCIUM 8.1 (L) 08/29/2018 0332   GFRNONAA 29 (L) 08/29/2018 0332   GFRAA 34 (L) 08/29/2018 0332   COAG Lab Results  Component Value Date   INR 1.1  08/22/2018   INR 1.20 01/12/2017   INR 1.08 06/24/2016     Disposition:  Discharge to :Skilled nursing facility Discharge Instructions    Call MD for:  redness, tenderness, or signs of infection (pain, swelling, bleeding, redness, odor or green/yellow discharge around incision site)   Complete by: As directed    Call MD for:  redness, tenderness, or signs of infection (pain, swelling, bleeding, redness, odor or green/yellow discharge around incision site)   Complete by: As directed    Call MD for:  severe or increased pain, loss or decreased feeling  in affected limb(s)   Complete by: As directed    Call MD for:  severe or increased pain, loss or decreased feeling  in affected limb(s)   Complete by: As directed    Call MD for:  temperature >100.5   Complete by: As directed    Call MD for:  temperature >100.5   Complete by: As directed    Resume previous diet   Complete by: As directed    Resume previous diet   Complete by: As directed      Allergies as of 09/01/2018      Reactions   Clonidine Other (See Comments)   Unknown reaction.   Codeine    unknown reaction   Demerol [meperidine]    unknown   Meperidine Hcl Other (See Comments)   Hypotension   Motrin [ibuprofen] Other (See Comments)   Renal issues   Penicillins Other (See Comments)   Did it involve swelling of the face/tongue/throat, SOB, or low BP? Unknown Did it involve sudden or severe rash/hives, skin peeling, or any reaction on the inside of your mouth or nose? Unknown Did you need to seek medical attention at a hospital or doctor's office? Unknown When did it last happen?Unknown If all above answers are "NO", may proceed with cephalosporin use.   Heparin Other (See Comments)   hypotension   Metformin And Related Diarrhea      Medication List    TAKE these medications   acetaminophen 325 MG tablet Commonly known as: TYLENOL Take 650 mg by mouth every 6 (six) hours as needed for mild pain.    amiodarone 200 MG tablet Commonly known as: PACERONE TAKE 1/2 TABLET BY MOUTH EVERY DAY   atorvastatin 10 MG tablet Commonly known as: LIPITOR Take 1 tablet (10 mg total) by mouth daily. What changed: when to take this   carvedilol 12.5 MG tablet Commonly known as: COREG Take 1 tablet (12.5 mg total) by mouth 2 (two) times daily with a meal.   clopidogrel 75 MG tablet Commonly known as: PLAVIX TAKE 1 TABLET BY MOUTH EVERY DAY WITH BREAKFAST What changed: See the new instructions.   divalproex 250 MG 24 hr tablet Commonly known as:  DEPAKOTE ER Take 1 tablet (250 mg total) by mouth at bedtime.   Entresto 24-26 MG Generic drug: sacubitril-valsartan TAKE 1 TABLET BY MOUTH 2 TIMES DAILY   ferrous sulfate 324 (65 Fe) MG Tbec Take 1 tablet by mouth daily.   finasteride 5 MG tablet Commonly known as: PROSCAR Take 1 tablet (5 mg total) by mouth daily.   folic acid A999333 MCG tablet Commonly known as: FOLVITE Take 400 mcg by mouth every Monday, Wednesday, and Friday.   gabapentin 100 MG capsule Commonly known as: NEURONTIN Take 100 mg by mouth 3 (three) times daily.   isosorbide mononitrate 30 MG 24 hr tablet Commonly known as: IMDUR TAKE 1 TABLET BY MOUTH EVERY DAY   levothyroxine 112 MCG tablet Commonly known as: SYNTHROID Take 1 tablet (112 mcg total) by mouth daily before breakfast.   loperamide 2 MG capsule Commonly known as: IMODIUM Take 2-4 mg by mouth 4 (four) times daily as needed for diarrhea or loose stools.   Melatonin 10 MG Tabs Take 10 mg by mouth at bedtime as needed (sleep).   nitroGLYCERIN 0.4 MG SL tablet Commonly known as: Nitrostat PLACE 1 TABLET (0.4 MG TOTAL) UNDER THE TONGUE EVERY 5 (FIVE) MINUTES AS NEEDED FOR CHEST PAIN.   saccharomyces boulardii 250 MG capsule Commonly known as: FLORASTOR Take 1 capsule (250 mg total) by mouth 2 (two) times daily.   sitaGLIPtin 25 MG tablet Commonly known as: JANUVIA Take 1 tablet (25 mg total) by mouth  daily. Take one tablet by mouth once daily to control blood sugar What changed:   when to take this  additional instructions   traMADol 50 MG tablet Commonly known as: ULTRAM Take 25 mg by mouth every 6 (six) hours as needed (pain).   vitamin B-12 500 MCG tablet Commonly known as: CYANOCOBALAMIN Take 500 mcg by mouth every Monday, Wednesday, and Friday.   VITAMIN B12-FOLIC ACID PO Take 1 tablet by mouth every Monday, Wednesday, and Friday.      Verbal and written Discharge instructions given to the patient. Wound care per Discharge AVS Follow-up Information    Elam Dutch, MD Follow up today.   Specialties: Vascular Surgery, Cardiology Why: as needed Contact information: [address] [place] [address] [phone]           Signed: Roxy Horseman 09/01/2018, 8:05 AM

## 2018-09-01 NOTE — Plan of Care (Signed)
  Problem: Health Behavior/Discharge Planning: Goal: Ability to manage health-related needs will improve Outcome: Progressing   Problem: Clinical Measurements: Goal: Ability to maintain clinical measurements within normal limits will improve Outcome: Progressing   Problem: Clinical Measurements: Goal: Will remain free from infection Outcome: Progressing   

## 2018-09-01 NOTE — Progress Notes (Signed)
Spoke with the patient's daughter this morning by phone.  His discharge will be delayed until tomorrow so that he can go to rehab in [place].  In addition she stated that apparently the patient is on Entresto and on [place].  We have no record of him being on those medications at the time of his initial visit when he saw me.  The patient's daughter would like these medications listed on his discharge summary for his rehab center.  I discussed with her that she needs to make sure from Dr. Kyla Balzarine office that the patient still needs Entresto.  She will also contact the patient's primary care regarding the Januvia to make sure that he still needs this as well.  If these 2 specialties are in agreement that the patient still needs these 2 medications we will add this to his discharge summary.  Ruta Hinds, MD [facility] [place] Office: [phone] Pager: [phone]

## 2018-09-01 NOTE — Progress Notes (Addendum)
[facility] [place]  Subjective  - No new complaints.   Objective (!) 161/63 61 97.6 F (36.4 C) (Oral) 16 93% No intake or output data in the 24 hours ending 09/01/18 0741  Left AKA healing well appears viable, posterior ecchymosis on stump Lungs non labored breathing Right LE superficial abrasion dry dressing with ace compression  Assessment/Planning: POD # 10 left AKA  Plan for discharge.  Patient's daughter [name] informed CSW she is renting a Chiropractor and a wheel chair to transport the patient to [facility]. Staples should be maintained for 4 weeks total post op until they are removed.     Roxy Horseman 09/01/2018 7:41 AM --  Agree with above. Hopefully d/c to [facility] today  Ruta Hinds, MD [facility] [place]: [phone] Pager: [phone]   Laboratory Lab Results: No results for input(s): WBC, HGB, HCT, PLT in the last 72 hours. BMET No results for input(s): NA, K, CL, CO2, GLUCOSE, BUN, CREATININE, CALCIUM in the last 72 hours.  COAG Lab Results  Component Value Date   INR 1.1 08/22/2018   INR 1.20 01/12/2017   INR 1.08 06/24/2016   No results found for: PTT

## 2018-09-01 NOTE — Progress Notes (Signed)
Occupational Therapy Treatment Patient Details Name: Evan [name] MRN: [id number] DOB: 07-01-1923 Today's Date: 09/01/2018    History of present illness Pt is a 85 y/o male admitted for evaluation of left foot pain and ulceration with recent arterial study showing poor perfusion of that L leg.  Pt s/p attempted recanalization left popliteal artery without significant success.   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  PMHx:  arthritis, CAD, CHF, DM2, HTN, pAFIB, vertigo.   OT comments  Daughter observing session. Plans to take pt to an apartment in [place]. Does not want pt in a SNF due to covid visiting restrictions. Plans to have around the clock care of friends and family. Educated daughter in importance of position changes and to watch for skin integrity. Recommended hospital bed. Daughter is renting an accessible Printmaker and w/c to take pt to [facility]. Educated pt in lateral transfer from bed to chair. Pt lethargic and nodding off once sitting in chair.  Follow Up Recommendations  Home health OT;Supervision/Assistance - 24 hour(daughter has set up an apartment in [place] )    Financial risk analyst (measurements OT);Wheelchair cushion (measurements OT);Hospital bed(drop arm commode)    Recommendations for Other Services      Precautions / Restrictions Precautions Precautions: Fall Restrictions RLE Weight Bearing: Weight bearing as tolerated LLE Weight Bearing: Non weight bearing       Mobility Bed Mobility Overal bed mobility: Needs Assistance Bed Mobility: Supine to Sit     Supine to sit: Mod assist     General bed mobility comments: multimodal cues for technique, assist for hips to EOB and to raise trunk  Transfers Overall transfer level: Needs assistance   Transfers: Lateral/Scoot Transfers          Lateral/Scoot Transfers: Mod assist General transfer comment: scooted with use of pad toward R side from bed slightly elevated to drop arm recliner, increased time  and effort    Balance Overall balance assessment: Needs assistance Sitting-balance support: Bilateral upper extremity supported;Feet supported Sitting balance-Leahy Scale: Fair                                     ADL either performed or assessed with clinical judgement   ADL       Grooming: Wash/dry hands;Wash/dry face;Sitting;Set up                                       Vision       Perception     Praxis      Cognition Arousal/Alertness: Lethargic Behavior During Therapy: Flat affect Overall Cognitive Status: Impaired/Different from baseline Area of Impairment: Awareness;Problem solving;Following commands;Safety/judgement;Attention                   Current Attention Level: Sustained   Following Commands: Follows one step commands with increased time Safety/Judgement: Decreased awareness of safety;Decreased awareness of deficits   Problem Solving: Slow processing;Decreased initiation;Requires verbal cues;Requires tactile cues;Difficulty sequencing General Comments: Very HOH        Exercises     Shoulder Instructions       General Comments      Pertinent Vitals/ Pain       Pain Assessment: Faces Faces Pain Scale: Hurts little more Pain Location: phantom pain Pain Descriptors / Indicators: Grimacing Pain Intervention(s): Monitored during session;Repositioned  Home Living                                          Prior Functioning/Environment              Frequency  Min 2X/week        Progress Toward Goals  OT Goals(current goals can now be found in the care plan section)  Progress towards OT goals: Progressing toward goals  Acute Rehab OT Goals Patient Stated Goal: to be able to transfer from bed to wheelchair to car to get to rehab. OT Goal Formulation: With patient Time For Goal Achievement: 09/06/18 Potential to Achieve Goals: Independence Discharge plan needs to be  updated(daughter is taking pt to an apartment in [place])    Co-evaluation                 AM-PAC OT "6 Clicks" Daily Activity     Outcome Measure   Help from another person eating meals?: A Little Help from another person taking care of personal grooming?: A Little Help from another person toileting, which includes using toliet, bedpan, or urinal?: Total Help from another person bathing (including washing, rinsing, drying)?: A Lot Help from another person to put on and taking off regular upper body clothing?: A Lot Help from another person to put on and taking off regular lower body clothing?: Total 6 Click Score: 12    End of Session Equipment Utilized During Treatment: Gait belt  OT Visit Diagnosis: Muscle weakness (generalized) (M62.81);Unsteadiness on feet (R26.81);Other symptoms and signs involving cognitive function;Pain   Activity Tolerance Patient limited by fatigue;Patient limited by lethargy   Patient Left in chair;with call bell/phone within reach;with family/visitor present   Nurse Communication          Time: [id number] OT Time Calculation (min): 59 min  Charges: OT Treatments $Therapeutic Activity: 53-67 mins  [name], OTR/L Acute Rehabilitation Services Pager: [phone] Office: [phone]   [name] 09/01/2018, 1:44 PM

## 2018-09-01 NOTE — Telephone Encounter (Addendum)
Per pts daughter, Morene Antu... she asked if I could add Entresto and Januvia back to his med list.. there was an issue at the hospital that consequently had it removed from his med list but he is being discharged back on both of them per Dr. Oneida Alar and going to rehab.   I advised her that both med are still on our med list in Epic no changes have been made.

## 2018-09-01 NOTE — Patient Outreach (Signed)
  [facility]) Care Management Chronic Special Needs Program   09/01/2018  Name: PRAJWAL ZURICK, DOB: 1923-02-20  MRN: [id number]  The client was discussed in today's interdisciplinary care team meeting.  The following issues were discussed:  Changes in health status, Coordination of care and Care transitions   Per social work notes: SNF declined per insurance. Daughter to take client to [facility]. Daughter aware [facility] is out of network and is going to pay out of pocket for SNF and arranged new insurance plan effective 09/06/2018. Daughter to take client to [facility] on 09/02/2018.  Participants present: Mahlon Gammon, RNCM; Natividad Brood, RNCM/hospital Liaison; Thea Silversmith, RNCM  Plan: [facility] will follow up with client/client's daughter at the beginning of the month re: any additional needs RNCM may assist with.   Thea Silversmith, RN, MSN, [place] [phone]

## 2018-09-01 NOTE — Telephone Encounter (Signed)
S/w daughter of pt to give recommendations.  Sent med rec staff message how pt goes about getting all records [facility] to [facility] and to call pt's daughter.

## 2018-09-01 NOTE — TOC Progression Note (Signed)
Transition of Care [facility]) - Progression Note    Patient Details  Name: Evan [name] MRN: [id number] Date of Birth: [birth year]  Transition of Care [facility]) CM/SW [place], [place] Phone Number: 09/01/2018, 4:23 PM  Clinical Narrative:     CSW received fax from [facility]- denied insurance approval for ST rehab at [place] provided the patient's daughter, Evan [name], with the Notice of denial.   Thurmond Butts, MSW, [facility] Clinical Social Worker [phone]   Expected Discharge Plan: Home/Self Care    Expected Discharge Plan and Services Expected Discharge Plan: Home/Self Care       Living arrangements for the past 2 months: [place] Expected Discharge Date: 09/01/18                                     Social Determinants of Health (SDOH) Interventions    Readmission Risk Interventions No flowsheet data found.

## 2018-09-01 NOTE — Telephone Encounter (Signed)
New Message   Pt c/o medication issue:  1. Name of Medication: Entresto  2. How are you currently taking this medication (dosage and times per day)? Take 1 tablet by mouth 2 times daily  3. Are you having a reaction (difficulty breathing--STAT)? No  4. What is your medication issue? Medication is not listed on the patient's medication list on chart. Needs to be added back so that it is included in the discharge instructions for the rehabilitation center he is being discharged to.

## 2018-09-01 NOTE — Consult Note (Signed)
   [facility] CM Inpatient Consult   09/01/2018  [name] 11-20-1923 [id number]   Follow up: Disposition  Chart reviewed for disposition and needs and notes of inpatient [facility] team for disposition.  Care coordination follow up with [place] Coordinator regarding patient's plan for a rehab transitioning to the [place] area.  For questions, please contact:  Natividad Brood, RN BSN [facility] Liaison  [phone] business mobile phone Toll free office [phone]  Fax number: [phone] [email or web address]

## 2018-09-01 NOTE — TOC Progression Note (Signed)
Transition of Care [facility]) - Progression Note    Patient Details  Name: Evan [name] MRN: [id number] Date of Birth: 1923/11/28  Transition of Care [facility]) CM/SW [place], [place] Phone Number: 09/01/2018, 12:05 PM  Clinical Narrative:     CSW faxed discharge summary and Covid results to [facility].   Thurmond Butts, MSW, [facility] Clinical Social Worker [phone]   Expected Discharge Plan: Home/Self Care    Expected Discharge Plan and Services Expected Discharge Plan: Home/Self Care       Living arrangements for the past 2 months: [place] Expected Discharge Date: 09/01/18                                     Social Determinants of Health (SDOH) Interventions    Readmission Risk Interventions No flowsheet data found.

## 2018-09-02 ENCOUNTER — Other Ambulatory Visit: Payer: Self-pay

## 2018-09-02 NOTE — Progress Notes (Addendum)
Called report to [facility] rehab [phone]) and spoke with Freddy Finner, RN

## 2018-09-02 NOTE — Care Management Important Message (Signed)
Important Message  Patient Details  Name: ROREY RAUF MRN: [id number] Date of Birth: 30-May-1923   Medicare Important Message Given:  Yes     Shelda Altes 09/02/2018, 1:03 PM

## 2018-09-02 NOTE — Telephone Encounter (Signed)
Please review to advise if refill is appropriate. Thank you!

## 2018-09-02 NOTE — Patient Outreach (Signed)
  [facility] [facility]) Care Management Chronic Special Needs Program  09/02/2018  Name: Evan [name] DOB: 1923-04-04  MRN: [id number]  Evan [name] is enrolled in a chronic special needs plan. Client admitted 08/18/2018-09/02/2018. Discharged to [facility] with daughter.   Goals Addressed            This Visit's Progress   . Client will not be readmitted within 30 days (C-SNP)(discharge date 09/02/2018)       Please follow discharged instructions and call provider if any questions. Please take your medications as prescribed. Please attend follow up appointments as scheduled. Please call 24 hour nurse advice line as needed. [phone].       Plan: RNCM will follow up with client/caregiver at the beginning of next month.    Thea Silversmith, RN, MSN, [place] [phone]   .

## 2018-09-02 NOTE — Progress Notes (Addendum)
  Progress Note    09/02/2018 7:45 AM 11 Days Post-Op  Subjective:  No complaints  Afebrile  Vitals:   09/01/18 2307 09/02/18 0423  BP: (!) 159/67 (!) 158/65  Pulse: (!) 59 61  Resp: (!) 29 (!) 23  Temp: (!) 97.5 F (36.4 C) (!) 97.5 F (36.4 C)  SpO2: 97% 97%    Physical Exam: Incisions:  Looks good with staples in tact.  Some ecchymosis posteriorly.   CBC    Component Value Date/Time   WBC 6.3 08/29/2018 0332   RBC 2.88 (L) 08/29/2018 0332   HGB 8.8 (L) 08/29/2018 0332   HGB 12.0 (L) 08/09/2018 1622   HCT 27.7 (L) 08/29/2018 0332   HCT 36.9 (L) 08/09/2018 1622   PLT 177 08/29/2018 0332   PLT 158 08/09/2018 1622   MCV 96.2 08/29/2018 0332   MCV 95 08/09/2018 1622   MCH 30.6 08/29/2018 0332   MCHC 31.8 08/29/2018 0332   RDW 14.4 08/29/2018 0332   RDW 14.0 08/09/2018 1622   LYMPHSABS 0.9 08/09/2018 1622   MONOABS 0.3 07/30/2016 0852   EOSABS 0.2 08/09/2018 1622   BASOSABS 0.0 08/09/2018 1622    BMET    Component Value Date/Time   NA 139 08/29/2018 0332   NA 144 08/09/2018 1622   K 4.5 08/29/2018 0332   CL 109 08/29/2018 0332   CO2 19 (L) 08/29/2018 0332   GLUCOSE 175 (H) 08/29/2018 0332   BUN 41 (H) 08/29/2018 0332   BUN 24 08/09/2018 1622   CREATININE 1.94 (H) 08/29/2018 0332   CALCIUM 8.1 (L) 08/29/2018 0332   GFRNONAA 29 (L) 08/29/2018 0332   GFRAA 34 (L) 08/29/2018 0332    INR    Component Value Date/Time   INR 1.1 08/22/2018 1315     Intake/Output Summary (Last 24 hours) at 09/02/2018 0745 Last data filed at 09/01/2018 2307 Gross per 24 hour  Intake 480 ml  Output 200 ml  Net 280 ml     Assessment/Plan:  85 y.o. male is s/p left above knee amputation  11 Days Post-Op  -incision looks good. -pt for discharge to facility in [place] and daughter will transport today -discussed with RN to let me know about [name] and Januvia prior to discharge    Leontine Locket, PA-C [facility] [phone] 09/02/2018 7:45  AM   Agree with above AKA healing To [facility] today. Will need staples out in 3-4 weeks.  Gave daughter name of vascular group in [place] Vomited once about 2 hrs ago.  No further nausea no abdominal pain soft on palpation  D/c  Ruta Hinds, MD [facility] [place] Office: [phone] Pager: [phone]

## 2018-09-02 NOTE — Progress Notes (Signed)
Physical Therapy Treatment Patient Details Name: Evan [name] MRN: [id number] DOB: 07-25-1923 Today's Date: 09/02/2018    History of Present Illness Pt is a 85 y/o male admitted for evaluation of left foot pain and ulceration with recent arterial study showing poor perfusion of that L leg.  Pt s/p attempted recanalization left popliteal artery without significant success.   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  PMHx:  arthritis, CAD, CHF, DM2, HTN, pAFIB, vertigo.    PT Comments    Pt daughter in room getting ready for rehab. Focus of session was to utilize sliding board to get in to w/c for d/c, however pt with abdominal pain and limited strength today. Pt requires maxAx2 for bed mobility and lateral scoot transfer utilizing bed pad. Pt to d/c to rehab facility in [place] to continue therapy.     Follow Up Recommendations  SNF;Supervision/Assistance - 24 hour     Equipment Recommendations  None recommended by PT       Precautions / Restrictions Precautions Precautions: Fall Precaution Comments: left residual limb - no drainage Restrictions Weight Bearing Restrictions: Yes RLE Weight Bearing: Weight bearing as tolerated LLE Weight Bearing: Non weight bearing    Mobility  Bed Mobility Overal bed mobility: Needs Assistance Bed Mobility: Supine to Sit     Supine to sit: +2 for physical assistance;Max assist     General bed mobility comments: pt able to initiate movement but ulitmately requires maxAx2 for sitting EoB  Transfers Overall transfer level: Needs assistance   Transfers: Lateral/Scoot Transfers          Lateral/Scoot Transfers: Max assist;+2 physical assistance General transfer comment: pt attempts to offweight hips unsuccessfully and ultimately requires maxAx2 to laterally scoot to w/c with use of pad      Balance Overall balance assessment: Needs assistance Sitting-balance support: Bilateral upper extremity supported;Feet supported Sitting balance-Leahy Scale: Fair                                      Cognition Arousal/Alertness: Lethargic Behavior During Therapy: Flat affect Overall Cognitive Status: Impaired/Different from baseline Area of Impairment: Attention;Awareness;Problem solving;Following commands;Safety/judgement                   Current Attention Level: Sustained   Following Commands: Follows multi-step commands inconsistently Safety/Judgement: Decreased awareness of safety Awareness: Emergent Problem Solving: Slow processing;Decreased initiation;Requires verbal cues;Requires tactile cues;Difficulty sequencing General Comments: Very HOH         General Comments General comments (skin integrity, edema, etc.): Daughter in room, pt getting ready for d/c.       Pertinent Vitals/Pain Pain Assessment: Faces Faces Pain Scale: Hurts even more Pain Location: stomach upset Pain Descriptors / Indicators: Grimacing Pain Intervention(s): Limited activity within patient's tolerance;Monitored during session;Repositioned           PT Goals (current goals can now be found in the care plan section) Acute Rehab PT Goals Patient Stated Goal: to be able to transfer from bed to wheelchair to car to get to rehab. PT Goal Formulation: With family Time For Goal Achievement: 09/06/18 Potential to Achieve Goals: Fair Progress towards PT goals: Not progressing toward goals - comment(pt with increased abdominal pain limiting participation )    Frequency    Min 3X/week      PT Plan Current plan remains appropriate       AM-PAC PT "6 Clicks" Mobility   Outcome  Measure  Help needed turning from your back to your side while in a flat bed without using bedrails?: A Lot Help needed moving from lying on your back to sitting on the side of a flat bed without using bedrails?: A Lot Help needed moving to and from a bed to a chair (including a wheelchair)?: A Lot Help needed standing up from a chair using your arms (e.g.,  wheelchair or bedside chair)?: A Lot Help needed to walk in hospital room?: Total Help needed climbing 3-5 steps with a railing? : Total 6 Click Score: 10    End of Session Equipment Utilized During Treatment: Gait belt Activity Tolerance: Patient limited by fatigue;Patient limited by lethargy Patient left: with call bell/phone within reach;with family/visitor present;with nursing/sitter in room(in w/c) Nurse Communication: Mobility status PT Visit Diagnosis: Muscle weakness (generalized) (M62.81);Difficulty in walking, not elsewhere classified (R26.2);Other abnormalities of gait and mobility (R26.89)     Time: [id number] PT Time Calculation (min) (ACUTE ONLY): 11 min  Charges:  $Therapeutic Activity: 8-22 mins                     Jayme Cham B. Migdalia Dk PT, DPT Acute Rehabilitation Services Pager [phone] Office [phone]    [place] 09/02/2018, 1:58 PM

## 2018-09-02 NOTE — Progress Notes (Signed)
Notified Dr.Fields of patient emesis.  He's ok with pt being discharge today.

## 2018-09-05 ENCOUNTER — Encounter (HOSPITAL_COMMUNITY): Payer: Self-pay | Admitting: Vascular Surgery

## 2018-09-07 ENCOUNTER — Other Ambulatory Visit: Payer: Self-pay

## 2018-09-07 NOTE — Patient Outreach (Signed)
  [facility]) Care Management Chronic Special Needs Program    09/07/2018  Name: KENDRA KRZYWICKI, DOB: [birth year]  MRN: [id number]   RNCM called to follow up. Spoke with daughter Park Sudhoff [facility]) who reports that client is at [facility].  She states that she has been unable to see Mr. Nazzal due to facility policies in place, but has been in contact with the facility every day since he arrived. She reports client is on Traditional Medicare at this time. She states she has the contact number for [employer] program [facility]) and has been in contact with healthcare concierge. She denies any questions at this time, but states is she has any questions going forward she will call the healthcare concierge.   Close case as client has transitioned out of C-SNP plan.  Thea Silversmith, RN, MSN, [place] [phone]

## 2018-09-08 ENCOUNTER — Ambulatory Visit: Payer: Self-pay | Admitting: Pharmacist

## 2018-09-08 ENCOUNTER — Other Ambulatory Visit: Payer: Self-pay | Admitting: Pharmacist

## 2018-09-08 NOTE — Patient Outreach (Signed)
[facility]) Care Management  09/08/2018  Evan [name] 1923/05/30 [id number]  Patient's daughter, Tommi Rumps, was called. Unfortunately, she did not answer the phone. HIPAA compliant message was left on her voicemail. From previous conversations with [facility] and chart review, patient has been transferred to a SNF in [place]. In addition, he has withdrawn from the [employer] CSNP plan and has traditional Medicare.    As such, patient's case is being closed. Closure letters will be sent to the patient and PCP.   [name], PharmD, [place] Clinical Pharmacist [phone]

## 2018-09-13 ENCOUNTER — Ambulatory Visit: Payer: HMO | Admitting: Physician Assistant

## 2018-10-06 DEATH — deceased

## 2018-10-31 ENCOUNTER — Ambulatory Visit: Payer: HMO | Admitting: Podiatry
